# Patient Record
Sex: Female | Born: 1950
Health system: Southern US, Community
[De-identification: ages and names within clinical notes are randomized; demographics above are authoritative.]

## PROBLEM LIST (undated history)

## (undated) DIAGNOSIS — D61818 Other pancytopenia: Secondary | ICD-10-CM

## (undated) DIAGNOSIS — E43 Unspecified severe protein-calorie malnutrition: Secondary | ICD-10-CM

## (undated) DIAGNOSIS — J45909 Unspecified asthma, uncomplicated: Secondary | ICD-10-CM

## (undated) DIAGNOSIS — M069 Rheumatoid arthritis, unspecified: Secondary | ICD-10-CM

## (undated) DIAGNOSIS — R42 Dizziness and giddiness: Secondary | ICD-10-CM

## (undated) DIAGNOSIS — B0089 Other herpesviral infection: Secondary | ICD-10-CM

## (undated) DIAGNOSIS — I10 Essential (primary) hypertension: Secondary | ICD-10-CM

## (undated) DIAGNOSIS — J449 Chronic obstructive pulmonary disease, unspecified: Secondary | ICD-10-CM

## (undated) DIAGNOSIS — E7800 Pure hypercholesterolemia, unspecified: Secondary | ICD-10-CM

## (undated) HISTORY — PX: OTHER SURGICAL HISTORY: SHX169

## (undated) HISTORY — PX: ABDOMINAL HYSTERECTOMY: SHX81

## (undated) HISTORY — DX: Rheumatoid arthritis, unspecified: M06.9

## (undated) HISTORY — PX: HIP ARTHROPLASTY: SHX981

## (undated) HISTORY — DX: Unspecified asthma, uncomplicated: J45.909

## (undated) HISTORY — DX: Dizziness and giddiness: R42

## (undated) HISTORY — DX: Pure hypercholesterolemia, unspecified: E78.00

## (undated) HISTORY — DX: Essential (primary) hypertension: I10

---
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
## 2000-08-09 ENCOUNTER — Inpatient Hospital Stay (HOSPITAL_COMMUNITY): Admission: EM | Admit: 2000-08-09 | Discharge: 2000-08-11 | Payer: Self-pay | Admitting: Emergency Medicine

## 2000-10-26 ENCOUNTER — Ambulatory Visit: Admission: RE | Admit: 2000-10-26 | Discharge: 2000-10-26 | Payer: Self-pay | Admitting: Pulmonary Disease

## 2016-05-13 ENCOUNTER — Other Ambulatory Visit: Payer: Self-pay | Admitting: Rheumatology

## 2016-05-14 LAB — COMPREHENSIVE METABOLIC PANEL
ALT: 23 IU/L (ref 0–32)
AST: 28 IU/L (ref 0–40)
Albumin/Globulin Ratio: 1.2 (ref 1.2–2.2)
Albumin: 3.8 g/dL (ref 3.6–4.8)
Alkaline Phosphatase: 83 IU/L (ref 39–117)
BUN/Creatinine Ratio: 20 (ref 12–28)
BUN: 28 mg/dL — ABNORMAL HIGH (ref 8–27)
Bilirubin Total: 0.3 mg/dL (ref 0.0–1.2)
CO2: 25 mmol/L (ref 18–29)
Calcium: 9 mg/dL (ref 8.7–10.3)
Chloride: 102 mmol/L (ref 96–106)
Creatinine, Ser: 1.41 mg/dL — ABNORMAL HIGH (ref 0.57–1.00)
GFR calc Af Amer: 45 mL/min/{1.73_m2} — ABNORMAL LOW (ref >59)
GFR calc non Af Amer: 39 mL/min/{1.73_m2} — ABNORMAL LOW (ref >59)
Globulin, Total: 3.2 g/dL (ref 1.5–4.5)
Glucose: 112 mg/dL — ABNORMAL HIGH (ref 65–99)
Potassium: 4.5 mmol/L (ref 3.5–5.2)
Sodium: 143 mmol/L (ref 134–144)
Total Protein: 7 g/dL (ref 6.0–8.5)

## 2016-05-14 LAB — CBC WITH DIFFERENTIAL/PLATELET
Basophils Absolute: 0.1 10*3/uL (ref 0.0–0.2)
Basos: 1 %
EOS (ABSOLUTE): 0.2 10*3/uL (ref 0.0–0.4)
Eos: 3 %
Hematocrit: 40 % (ref 34.0–46.6)
Hemoglobin: 13.4 g/dL (ref 11.1–15.9)
Immature Grans (Abs): 0 10*3/uL (ref 0.0–0.1)
Immature Granulocytes: 0 %
Lymphocytes Absolute: 3.7 10*3/uL — ABNORMAL HIGH (ref 0.7–3.1)
Lymphs: 41 %
MCH: 30.4 pg (ref 26.6–33.0)
MCHC: 33.5 g/dL (ref 31.5–35.7)
MCV: 91 fL (ref 79–97)
Monocytes Absolute: 0.7 10*3/uL (ref 0.1–0.9)
Monocytes: 8 %
Neutrophils Absolute: 4.3 10*3/uL (ref 1.4–7.0)
Neutrophils: 47 %
Platelets: 307 10*3/uL (ref 150–379)
RBC: 4.41 x10E6/uL (ref 3.77–5.28)
RDW: 13.5 % (ref 12.3–15.4)
WBC: 9.1 10*3/uL (ref 3.4–10.8)

## 2016-05-20 ENCOUNTER — Telehealth: Payer: Self-pay | Admitting: Radiology

## 2016-05-20 NOTE — Telephone Encounter (Signed)
Refill request received via fax for Humira [place]

## 2016-05-22 NOTE — Telephone Encounter (Signed)
I have called patient left message for her to call me back so we can discuss

## 2016-05-22 NOTE — Progress Notes (Signed)
Please fax results to PCP. Notify patient to see PCP

## 2016-05-22 NOTE — Telephone Encounter (Signed)
-----   Message from Bo Merino, MD sent at 05/22/2016  4:06 PM EST ----- Please fax results to PCP. Notify patient to see PCP

## 2016-05-23 MED ORDER — ADALIMUMAB 40 MG/0.8ML ~~LOC~~ AJKT: 1.0000 "pen " | AUTO-INJECTOR | SUBCUTANEOUS | 0 refills | Status: DC

## 2016-05-23 NOTE — Telephone Encounter (Signed)
I have discussed with patient / she has voiced understanding she will also get copy of labs when she comes into the office Ok to refill Humira per Dr Estanislado Pandy

## 2016-05-26 ENCOUNTER — Encounter: Payer: Self-pay | Admitting: Rheumatology

## 2016-05-26 ENCOUNTER — Ambulatory Visit: Payer: Self-pay | Admitting: Rheumatology

## 2016-05-26 ENCOUNTER — Ambulatory Visit (INDEPENDENT_AMBULATORY_CARE_PROVIDER_SITE_OTHER): Payer: BLUE CROSS/BLUE SHIELD | Admitting: Rheumatology

## 2016-05-26 VITALS — BP 136/86 | HR 84 | Resp 14 | Ht 63.0 in | Wt 182.0 lb

## 2016-05-26 DIAGNOSIS — Z79899 Other long term (current) drug therapy: Secondary | ICD-10-CM

## 2016-05-26 DIAGNOSIS — J069 Acute upper respiratory infection, unspecified: Secondary | ICD-10-CM | POA: Diagnosis not present

## 2016-05-26 DIAGNOSIS — B9789 Other viral agents as the cause of diseases classified elsewhere: Secondary | ICD-10-CM | POA: Diagnosis not present

## 2016-05-26 DIAGNOSIS — M0579 Rheumatoid arthritis with rheumatoid factor of multiple sites without organ or systems involvement: Secondary | ICD-10-CM

## 2016-05-26 DIAGNOSIS — N289 Disorder of kidney and ureter, unspecified: Secondary | ICD-10-CM

## 2016-05-26 NOTE — Progress Notes (Signed)
Office Visit Note  Patient: Kaylee Rasmussen             Date of Birth: [birth year]           MRN: [id number]             PCP: Nicoletta Dress, MD Referring: No ref. provider found Visit Date: 05/26/2016 Occupation: @GUAROCC @    Subjective:  No chief complaint on file. Follow-up on rheumatoid arthritis and high risk prescription Currently has upper respiratory infection since Friday    History of Present Illness: Kaylee Rasmussen is a 65 y.o. female  Last seen 12/20/2015 Patient states that she is doing well with her Humira. She has no joint pain swelling or stiffness Her right elbow has a 25 contracture from the past but the left elbows contractor has resolved according to the patient. She has perfect range of motion according to the patient. In addition, she states that her rheumatoid nodule on the left elbow is even smaller.  Currently she's had a cold since this past Friday. We discussed stopping her Humira temporarily until her cold resolves. Goal is that she starts her Humira again in about a week or 2. Patient is agreeable. She has cough and chest congestion occasional fever runny nose. In the past she's had bronchitis. Please see note from 12/20/2015 where was noted that she was off of her Humira since April 2017 due to bronchitis.  Patient's TB gold is up-to-date and normal as of September 2017  She has enough Humira at home.  Note that she will be 66 and on Medicare starting approximately July 2018.   Activities of Daily Living:  Patient reports morning stiffness for 15 minutes.   Patient Denies nocturnal pain.  Difficulty dressing/grooming: Denies Difficulty climbing stairs: Denies Difficulty getting out of chair: Denies Difficulty using hands for taps, buttons, cutlery, and/or writing: Denies   Review of Systems  Constitutional: Negative for fatigue.  HENT: Negative for mouth sores and mouth dryness.   Eyes: Negative for dryness.  Respiratory: Negative for  shortness of breath.   Gastrointestinal: Negative for constipation and diarrhea.  Musculoskeletal: Negative for myalgias and myalgias.  Skin: Negative for sensitivity to sunlight.  Psychiatric/Behavioral: Negative for decreased concentration and sleep disturbance.    PMFS History:  There are no active problems to display for this patient.   Past Medical History:  Diagnosis Date  . Rheumatoid arthritis ([place])     Family History  Problem Relation Age of Onset  . Diabetes Mother   . Heart attack Mother   . Heart attack Father    Past Surgical History:  Procedure Laterality Date  . ABDOMINAL HYSTERECTOMY    . HIP ARTHROPLASTY    . Lumbarectomy     Social History   Social History Narrative  . No narrative on file     Objective: Vital Signs: BP 136/86 (BP Location: Left Arm, Patient Position: Sitting, Cuff Size: Large)   Pulse 84   Resp 14   Ht 5\' 3"  (1.6 m)   Wt 182 lb (82.6 kg)   BMI 32.24 kg/m    Physical Exam  Constitutional: She is oriented to person, place, and time. She appears well-developed and well-nourished.  HENT:  Head: Normocephalic and atraumatic.  Eyes: EOM are normal. Pupils are equal, round, and reactive to light.  Cardiovascular: Normal rate, regular rhythm and normal heart sounds.  Exam reveals no gallop and no friction rub.   No murmur heard. Pulmonary/Chest: Effort normal and breath  sounds normal. She has no wheezes. She has no rales.  Abdominal: Soft. Bowel sounds are normal. She exhibits no distension. There is no tenderness. There is no guarding. No hernia.  Musculoskeletal: Normal range of motion. She exhibits no edema, tenderness or deformity.  Lymphadenopathy:    She has no cervical adenopathy.  Neurological: She is alert and oriented to person, place, and time. Coordination normal.  Skin: Skin is warm and dry. Capillary refill takes less than 2 seconds. No rash noted.  Psychiatric: She has a normal mood and affect. Her behavior is normal.    Nursing note and vitals reviewed.    Musculoskeletal Exam:  Full range of motion of all joints Grip strength is equal and strong bilaterally Fiber myalgia tender points are all absent  CDAI Exam: No CDAI exam completed.    Investigation: No additional findings. Please see labs from 05/13/2016 in Epic. All labs are normal except for elevated creatinine 1.41; GFR low at 39  Imaging: No results found.  Speciality Comments: No specialty comments available.    Procedures:  No procedures performed Allergies: Scallops [shellfish allergy]   Assessment / Plan:     Visit Diagnoses: Viral upper respiratory tract infection - started friday, May 23, 2016; no fever, hx of bronchitis; + congestion; + cough, +Runny nose;  Rheumatoid arthritis with rheumatoid factor of multiple sites without organ or systems involvement ([place]) - Ongoing contracture right elbow; resolved contracture left elbow; resolving left elbow joint nodules  High risk medications (not anticoagulants) long-term use  Abnormal kidney function - 05/13/2016: CBC with differential is normal: CMP with GFR is normal except for elevated creatinine to 1.41; GFR low at 39.   Patient is doing well with her current treatment of rheumatoid arthritis that is erosive in nature with nodulosis with Humira. She's been on this medication for the last 13-14 years. She is due for another Humira shot this Friday but I've asked the patient to skip her Humira injection this coming Friday since she has upper respiratory infection. She is to restart her Humira as soon as her upper respiratory infection is resolved  Patient does not need Humira refilled at this time. She will call us if she needs it  Labs are up-to-date from 05/13/2016. Patient's kidney functions are slightly abnormal. Please see labs for full details. We will repeat CMP with GFR in one month. We will also send these labs to PCP so they can evaluate and treat. Her only  medication from our offices Humira. She is not on methotrexate.  Orders: No orders of the defined types were placed in this encounter.  No orders of the defined types were placed in this encounter.   Face-to-face time spent with patient was 30 minutes. 50% of time was spent in counseling and coordination of care.  Follow-Up Instructions: Return in about 5 months (around 10/24/2016) for RA,Humira Q 2 wks; Abnl kidney fnx May 13, 2016, contracture.   Eliezer Lofts, PA-C I examined and evaluated the patient with Eliezer Lofts PA. The plan of care was discussed as noted above.  Bo Merino, MD

## 2016-08-07 ENCOUNTER — Other Ambulatory Visit: Payer: Self-pay | Admitting: *Deleted

## 2016-08-07 MED ORDER — ADALIMUMAB 40 MG/0.8ML ~~LOC~~ AJKT: 1.0000 "pen " | AUTO-INJECTOR | SUBCUTANEOUS | 0 refills | Status: DC

## 2016-08-07 NOTE — Telephone Encounter (Signed)
Refill request received via fax for Humira  Last Visit: 05/26/16 Next Visit: 10/24/16 Labs: 05/13/16 Creat. 1.41 GFR 39 TB Gold: 02/22/16 Neg  Left message to remind patient she is due for labs this month.   Okay to refill Humira?

## 2016-08-19 ENCOUNTER — Telehealth: Payer: Self-pay | Admitting: Rheumatology

## 2016-08-19 NOTE — Telephone Encounter (Signed)
Patient advised that based off her last office note she just needs standing orders (CBC and CMP). Patient verbalized understanding.

## 2016-08-19 NOTE — Telephone Encounter (Signed)
Patient called stating that it is time to have her usual blood work done but Mr. Carlyon Shadow stated she needed more extensive blood work done but she has lost the order.  She is wanting to know if the order could be faxed to [facility] in [place], [phone].  Thank you.

## 2016-08-22 ENCOUNTER — Other Ambulatory Visit: Payer: Self-pay | Admitting: *Deleted

## 2016-08-22 DIAGNOSIS — Z79899 Other long term (current) drug therapy: Secondary | ICD-10-CM

## 2016-08-22 LAB — CBC WITH DIFFERENTIAL/PLATELET
BASOS PCT: 1 %
Basophils Absolute: 83 cells/uL (ref 0–200)
EOS ABS: 498 {cells}/uL (ref 15–500)
EOS PCT: 6 %
HCT: 38.5 % (ref 35.0–45.0)
Hemoglobin: 12.9 g/dL (ref 11.7–15.5)
Lymphocytes Relative: 39 %
Lymphs Abs: 3237 cells/uL (ref 850–3900)
MCH: 30.7 pg (ref 27.0–33.0)
MCHC: 33.5 g/dL (ref 32.0–36.0)
MCV: 91.7 fL (ref 80.0–100.0)
MONOS PCT: 10 %
MPV: 9.4 fL (ref 7.5–12.5)
Monocytes Absolute: 830 cells/uL (ref 200–950)
NEUTROS ABS: 3652 {cells}/uL (ref 1500–7800)
Neutrophils Relative %: 44 %
PLATELETS: 305 10*3/uL (ref 140–400)
RBC: 4.2 MIL/uL (ref 3.80–5.10)
RDW: 14 % (ref 11.0–15.0)
WBC: 8.3 10*3/uL (ref 3.8–10.8)

## 2016-08-22 LAB — COMPLETE METABOLIC PANEL WITH GFR
ALT: 24 U/L (ref 6–29)
AST: 35 U/L (ref 10–35)
Albumin: 3.4 g/dL — ABNORMAL LOW (ref 3.6–5.1)
Alkaline Phosphatase: 69 U/L (ref 33–130)
BILIRUBIN TOTAL: 0.5 mg/dL (ref 0.2–1.2)
BUN: 18 mg/dL (ref 7–25)
CHLORIDE: 108 mmol/L (ref 98–110)
CO2: 25 mmol/L (ref 20–31)
CREATININE: 1.25 mg/dL — AB (ref 0.50–0.99)
Calcium: 8.7 mg/dL (ref 8.6–10.4)
GFR, EST AFRICAN AMERICAN: 53 mL/min — AB (ref >=60)
GFR, Est Non African American: 46 mL/min — ABNORMAL LOW (ref >=60)
GLUCOSE: 108 mg/dL — AB (ref 65–99)
Potassium: 3.9 mmol/L (ref 3.5–5.3)
SODIUM: 143 mmol/L (ref 135–146)
TOTAL PROTEIN: 6.7 g/dL (ref 6.1–8.1)

## 2016-08-25 NOTE — Progress Notes (Signed)
Labs stable

## 2016-10-23 ENCOUNTER — Ambulatory Visit (INDEPENDENT_AMBULATORY_CARE_PROVIDER_SITE_OTHER): Payer: BLUE CROSS/BLUE SHIELD | Admitting: Rheumatology

## 2016-10-23 ENCOUNTER — Encounter: Payer: Self-pay | Admitting: Rheumatology

## 2016-10-23 VITALS — BP 177/85 | HR 63 | Resp 15 | Ht 63.0 in | Wt 184.0 lb

## 2016-10-23 DIAGNOSIS — M0579 Rheumatoid arthritis with rheumatoid factor of multiple sites without organ or systems involvement: Secondary | ICD-10-CM | POA: Diagnosis not present

## 2016-10-23 DIAGNOSIS — Z79899 Other long term (current) drug therapy: Secondary | ICD-10-CM | POA: Diagnosis not present

## 2016-10-23 DIAGNOSIS — R944 Abnormal results of kidney function studies: Secondary | ICD-10-CM

## 2016-10-23 DIAGNOSIS — R7989 Other specified abnormal findings of blood chemistry: Secondary | ICD-10-CM

## 2016-10-23 DIAGNOSIS — M170 Bilateral primary osteoarthritis of knee: Secondary | ICD-10-CM

## 2016-10-23 NOTE — Patient Instructions (Signed)
   Standing Labs We placed an order today for your standing lab work.    Please come back and get your standing labs every 3 months starting First week of June 2018, and then again in September 2018( Will also need TB Gold at that time).  We have open lab Monday through Friday from 8:30-11:30 AM and 1:30-4 PM at the office of Dr. Tresa Moore, PA.   The office is located at [address] [place], [place], [place] [address] No appointment is necessary.   Labs are drawn by [facility].  You may receive a bill from [facility] for your lab work.

## 2016-10-23 NOTE — Progress Notes (Signed)
Office Visit Note  Patient: Kaylee Rasmussen             Date of Birth: 16-Jul-1950           MRN: [id number]             PCP: Nicoletta Dress, MD Referring: Nicoletta Dress, MD Visit Date: 10/23/2016 Occupation: _0 @    Subjective:  Follow-up   History of Present Illness: Kaylee Rasmussen is a 66 y.o. female  LAST SEEN 05/26/2016.   History of rheumatoid arthritis. Positive rheumatoid factor. Had to suspend Humira temporarily while patient was fighting bronchitis in dec 2017.  Please see last visit December 2017 for full details. After her bronchitis resolved, She restarted the Humira and has been doing well. Patient is taking her medication every 14 TO 17 DAYS. Despite extending it out for a few days to 17 days each week injections, she has No joint pain, swelling, or stiffness.  Activities of Daily Living:  Patient reports morning stiffness for 30 minutes.   Patient Denies nocturnal pain.  Difficulty dressing/grooming: Denies Difficulty climbing stairs: Denies Difficulty getting out of chair: Denies Difficulty using hands for taps, buttons, cutlery, and/or writing: Denies   Review of Systems  Constitutional: Negative for fatigue.  HENT: Negative for mouth sores and mouth dryness.   Eyes: Negative for dryness.  Respiratory: Negative for shortness of breath.   Gastrointestinal: Negative for constipation and diarrhea.  Musculoskeletal: Negative for myalgias and myalgias.  Skin: Negative for sensitivity to sunlight.  Psychiatric/Behavioral: Negative for decreased concentration and sleep disturbance.    PMFS History:  There are no active problems to display for this patient.   Past Medical History:  Diagnosis Date  . High cholesterol   . Hypertension   . Rheumatoid arthritis ([place])     Family History  Problem Relation Age of Onset  . Diabetes Mother   . Heart attack Mother   . Heart attack Father    Past Surgical History:  Procedure Laterality Date  .  ABDOMINAL HYSTERECTOMY    . HIP ARTHROPLASTY    . Lumbarectomy     Social History   Social History Narrative  . No narrative on file     Objective: Vital Signs: BP (!) 177/85 (BP Location: Left Arm, Patient Position: Sitting, Cuff Size: Normal)   Pulse 63   Resp 15   Ht 5' 3" (1.6 m)   Wt 184 lb (83.5 kg)   BMI 32.59 kg/m    Physical Exam  Constitutional: She is oriented to person, place, and time. She appears well-developed and well-nourished.  HENT:  Head: Normocephalic and atraumatic.  Eyes: EOM are normal. Pupils are equal, round, and reactive to light.  Cardiovascular: Normal rate, regular rhythm and normal heart sounds.  Exam reveals no gallop and no friction rub.   No murmur heard. Pulmonary/Chest: Effort normal and breath sounds normal. She has no wheezes. She has no rales.  Abdominal: Soft. Bowel sounds are normal. She exhibits no distension. There is no tenderness. There is no guarding. No hernia.  Musculoskeletal: Normal range of motion. She exhibits no edema, tenderness or deformity.  Lymphadenopathy:    She has no cervical adenopathy.  Neurological: She is alert and oriented to person, place, and time. Coordination normal.  Skin: Skin is warm and dry. Capillary refill takes less than 2 seconds. No rash noted.  Psychiatric: She has a normal mood and affect. Her behavior is normal.  Nursing note and vitals reviewed.  Musculoskeletal Exam:  Full range of motion of all joints Grip strength is equal and strong bilaterally Fibromyalgia tender points are all absent  CDAI Exam: CDAI Homunculus Exam:   Joint Counts:  CDAI Tender Joint count: 0 CDAI Swollen Joint count: 0   No synovitis on examination  Investigation: No additional findings.   TB GOLD neg 02/22/16  Orders Only on 08/22/2016  Component Date Value Ref Range Status  . WBC 08/22/2016 8.3  3.8 - 10.8 K/uL Final  . RBC 08/22/2016 4.20  3.80 - 5.10 MIL/uL Final  . Hemoglobin 08/22/2016 12.9   11.7 - 15.5 g/dL Final  . HCT 08/22/2016 38.5  35.0 - 45.0 % Final  . MCV 08/22/2016 91.7  80.0 - 100.0 fL Final  . MCH 08/22/2016 30.7  27.0 - 33.0 pg Final  . MCHC 08/22/2016 33.5  32.0 - 36.0 g/dL Final  . RDW 08/22/2016 14.0  11.0 - 15.0 % Final  . Platelets 08/22/2016 305  140 - 400 K/uL Final  . MPV 08/22/2016 9.4  7.5 - 12.5 fL Final  . Neutro Abs 08/22/2016 3652  1,500 - 7,800 cells/uL Final  . Lymphs Abs 08/22/2016 3237  850 - 3,900 cells/uL Final  . Monocytes Absolute 08/22/2016 830  200 - 950 cells/uL Final  . Eosinophils Absolute 08/22/2016 498  15 - 500 cells/uL Final  . Basophils Absolute 08/22/2016 83  0 - 200 cells/uL Final  . Neutrophils Relative % 08/22/2016 44  % Final  . Lymphocytes Relative 08/22/2016 39  % Final  . Monocytes Relative 08/22/2016 10  % Final  . Eosinophils Relative 08/22/2016 6  % Final  . Basophils Relative 08/22/2016 1  % Final  . Smear Review 08/22/2016 Criteria for review not met   Final  . Sodium 08/22/2016 143  135 - 146 mmol/L Final  . Potassium 08/22/2016 3.9  3.5 - 5.3 mmol/L Final  . Chloride 08/22/2016 108  98 - 110 mmol/L Final  . CO2 08/22/2016 25  20 - 31 mmol/L Final  . Glucose, Bld 08/22/2016 108* 65 - 99 mg/dL Final  . BUN 08/22/2016 18  7 - 25 mg/dL Final  . Creat 08/22/2016 1.25* 0.50 - 0.99 mg/dL Final   Comment:   For patients > or = 65 years of age: The upper reference limit for Creatinine is approximately 13% higher for people identified as African-American.     . Total Bilirubin 08/22/2016 0.5  0.2 - 1.2 mg/dL Final  . Alkaline Phosphatase 08/22/2016 69  33 - 130 U/L Final  . AST 08/22/2016 35  10 - 35 U/L Final  . ALT 08/22/2016 24  6 - 29 U/L Final  . Total Protein 08/22/2016 6.7  6.1 - 8.1 g/dL Final  . Albumin 08/22/2016 3.4* 3.6 - 5.1 g/dL Final  . Calcium 08/22/2016 8.7  8.6 - 10.4 mg/dL Final  . GFR, Est African American 08/22/2016 53* >=60 mL/min Final  . GFR, Est Non African American 08/22/2016 46* >=60  mL/min Final  Orders Only on 05/13/2016  Component Date Value Ref Range Status  . WBC 05/13/2016 9.1  3.4 - 10.8 x10E3/uL Final  . RBC 05/13/2016 4.41  3.77 - 5.28 x10E6/uL Final  . Hemoglobin 05/13/2016 13.4  11.1 - 15.9 g/dL Final   Comment: **Effective May 26, 2016 the reference interval**   for Hemoglobin MALES only will be changing to:  Males 13-15 years: 12.6 - 17.7                         Males   >15 years: 13.0 - 17.7   . Hematocrit 05/13/2016 40.0  34.0 - 46.6 % Final  . MCV 05/13/2016 91  79 - 97 fL Final  . MCH 05/13/2016 30.4  26.6 - 33.0 pg Final  . MCHC 05/13/2016 33.5  31.5 - 35.7 g/dL Final  . RDW 05/13/2016 13.5  12.3 - 15.4 % Final  . Platelets 05/13/2016 307  150 - 379 x10E3/uL Final  . Neutrophils 05/13/2016 47  Not Estab. % Final  . Lymphs 05/13/2016 41  Not Estab. % Final  . Monocytes 05/13/2016 8  Not Estab. % Final  . Eos 05/13/2016 3  Not Estab. % Final  . Basos 05/13/2016 1  Not Estab. % Final  . Neutrophils Absolute 05/13/2016 4.3  1.4 - 7.0 x10E3/uL Final  . Lymphocytes Absolute 05/13/2016 3.7* 0.7 - 3.1 x10E3/uL Final  . Monocytes Absolute 05/13/2016 0.7  0.1 - 0.9 x10E3/uL Final  . EOS (ABSOLUTE) 05/13/2016 0.2  0.0 - 0.4 x10E3/uL Final  . Basophils Absolute 05/13/2016 0.1  0.0 - 0.2 x10E3/uL Final  . Immature Granulocytes 05/13/2016 0  Not Estab. % Final  . Immature Grans (Abs) 05/13/2016 0.0  0.0 - 0.1 x10E3/uL Final  . Glucose 05/13/2016 112* 65 - 99 mg/dL Final  . BUN 05/13/2016 28* 8 - 27 mg/dL Final  . Creatinine, Ser 05/13/2016 1.41* 0.57 - 1.00 mg/dL Final  . GFR calc non Af Amer 05/13/2016 39* >59 mL/min/1.73 Final  . GFR calc Af Amer 05/13/2016 45* >59 mL/min/1.73 Final  . BUN/Creatinine Ratio 05/13/2016 20  12 - 28 Final  . Sodium 05/13/2016 143  134 - 144 mmol/L Final  . Potassium 05/13/2016 4.5  3.5 - 5.2 mmol/L Final  . Chloride 05/13/2016 102  96 - 106 mmol/L Final  . CO2 05/13/2016 25  18 - 29 mmol/L Final    . Calcium 05/13/2016 9.0  8.7 - 10.3 mg/dL Final  . Total Protein 05/13/2016 7.0  6.0 - 8.5 g/dL Final  . Albumin 05/13/2016 3.8  3.6 - 4.8 g/dL Final  . Globulin, Total 05/13/2016 3.2  1.5 - 4.5 g/dL Final  . Albumin/Globulin Ratio 05/13/2016 1.2  1.2 - 2.2 Final  . Bilirubin Total 05/13/2016 0.3  0.0 - 1.2 mg/dL Final  . Alkaline Phosphatase 05/13/2016 83  39 - 117 IU/L Final  . AST 05/13/2016 28  0 - 40 IU/L Final  . ALT 05/13/2016 23  0 - 32 IU/L Final     Imaging: No results found.  Speciality Comments: No specialty comments available.    Procedures:  No procedures performed Allergies: Scallops [shellfish allergy]   Assessment / Plan:     Visit Diagnoses: High risk medications (not anticoagulants) long-term use - Plan: CBC with Differential/Platelet, COMPLETE METABOLIC PANEL WITH GFR, Quantiferon tb gold assay (blood)  Rheumatoid arthritis with rheumatoid factor of multiple sites without organ or systems involvement ([facility])  Primary osteoarthritis of both knees  Elevated serum creatinine  Decreased GFR   TB GOLD neg 02/22/16  Continue Humira every 14-17 days  We will monitor elevated creatinine and decreased GFR  CBC with differential, CMP with GFR every 3 months  Orders: Orders Placed This Encounter  Procedures  . CBC with Differential/Platelet  . COMPLETE METABOLIC PANEL WITH GFR  . Quantiferon tb gold assay (blood)  No orders of the defined types were placed in this encounter.   Face-to-face time spent with patient was 30 minutes. 50% of time was spent in counseling and coordination of care.  Follow-Up Instructions: Return in about 5 months (around 03/25/2017).   Eliezer Lofts, PA-C  Note - This record has been created using [employer].  Chart creation errors have been sought, but may not always  have been located. Such creation errors do not reflect on  the standard of medical care.

## 2016-10-23 NOTE — Progress Notes (Signed)
I was asked to speak to patient during her visit regarding Humira coverage with Medicare.  Patient reports she is going to be switching to [facility] in July.  Patient reports she is currently working with an Medical illustrator to identify which insurance will be best for her, but she is still concerned about the cost of Humira with medicare.  Had a detailed discussion regarding Medicare and copay assistance.  Advised that manufacturer copay cards cannot be used with Medicare.  Discussed grant foundations, but advised that they have limited funding.  Also discussed manufacturer patient assistance program.  Reviewed income qualifications for the [place] patient assistance program for Humira, and patient thinks she would qualify.  I printed off application for patient.  Patient will need the information on her new insurance plan before we can apply.  She plans to complete the application and get required income information.  She will bring application to our office when it is completed.  I will follow up with patient in early July if I have not heard from her at that point.    Elisabeth Most, Pharm.D., BCPS, CPP Clinical Pharmacist Pager: [phone] Phone: [phone] 10/23/2016 2:57 PM

## 2016-10-24 ENCOUNTER — Ambulatory Visit: Payer: BLUE CROSS/BLUE SHIELD | Admitting: Rheumatology

## 2016-11-24 ENCOUNTER — Telehealth: Payer: Self-pay | Admitting: Pharmacist

## 2016-11-24 ENCOUNTER — Other Ambulatory Visit: Payer: Self-pay

## 2016-11-24 DIAGNOSIS — Z79899 Other long term (current) drug therapy: Secondary | ICD-10-CM

## 2016-11-24 LAB — CBC WITH DIFFERENTIAL/PLATELET
BASOS PCT: 0 %
Basophils Absolute: 0 cells/uL (ref 0–200)
EOS PCT: 4 %
Eosinophils Absolute: 296 cells/uL (ref 15–500)
HEMATOCRIT: 43.4 % (ref 35.0–45.0)
HEMOGLOBIN: 14.2 g/dL (ref 11.7–15.5)
LYMPHS ABS: 3404 {cells}/uL (ref 850–3900)
Lymphocytes Relative: 46 %
MCH: 29.6 pg (ref 27.0–33.0)
MCHC: 32.7 g/dL (ref 32.0–36.0)
MCV: 90.4 fL (ref 80.0–100.0)
MONO ABS: 1036 {cells}/uL — AB (ref 200–950)
MPV: 9.5 fL (ref 7.5–12.5)
Monocytes Relative: 14 %
NEUTROS ABS: 2664 {cells}/uL (ref 1500–7800)
NEUTROS PCT: 36 %
Platelets: 268 10*3/uL (ref 140–400)
RBC: 4.8 MIL/uL (ref 3.80–5.10)
RDW: 14.3 % (ref 11.0–15.0)
WBC: 7.4 10*3/uL (ref 3.8–10.8)

## 2016-11-24 NOTE — Telephone Encounter (Signed)
Yes, it is Okay to submit for Humira patient assistance program. I signed it already

## 2016-11-24 NOTE — Telephone Encounter (Signed)
Form was faxed to [facility] patient assistance program.  Will update patient once we know status of application.   Elisabeth Most, Pharm.D., BCPS, CPP Clinical Pharmacist Pager: [phone] Phone: [phone] 11/24/2016 4:24 PM

## 2016-11-24 NOTE — Telephone Encounter (Signed)
Patient came into the office for labs today.  She brought her portion of Humira patient assistance application, new insurance cards, and income information.    Last visit: 10/23/16 Next visit: 03/26/17 Labs: 08/22/16 labs stable.  Patient had repeat labs today 02/22/16 TB Gold negative  Okay to submit for Humira patient assistance program?  Form was placed on your desk   Elisabeth Most, Pharm.D., BCPS, CPP Clinical Pharmacist Pager: [phone] Phone: [phone] 11/24/2016 2:14 PM

## 2016-11-25 LAB — COMPLETE METABOLIC PANEL WITH GFR
ALBUMIN: 3.5 g/dL — AB (ref 3.6–5.1)
ALK PHOS: 75 U/L (ref 33–130)
ALT: 29 U/L (ref 6–29)
AST: 28 U/L (ref 10–35)
BUN: 20 mg/dL (ref 7–25)
CALCIUM: 8.7 mg/dL (ref 8.6–10.4)
CO2: 21 mmol/L (ref 20–31)
CREATININE: 1.12 mg/dL — AB (ref 0.50–0.99)
Chloride: 108 mmol/L (ref 98–110)
GFR, Est African American: 60 mL/min (ref >=60)
GFR, Est Non African American: 52 mL/min — ABNORMAL LOW (ref >=60)
GLUCOSE: 81 mg/dL (ref 65–99)
POTASSIUM: 4.5 mmol/L (ref 3.5–5.3)
SODIUM: 142 mmol/L (ref 135–146)
TOTAL PROTEIN: 6.7 g/dL (ref 6.1–8.1)
Total Bilirubin: 0.6 mg/dL (ref 0.2–1.2)

## 2016-12-08 ENCOUNTER — Telehealth: Payer: Self-pay

## 2016-12-08 NOTE — Telephone Encounter (Signed)
I informed patient of status of her application.  I advised her to contact us if she runs out of Humira and we can provider her with a sample.  Patient voiced understanding and denies any questions or concerns at this time.   Elisabeth Most, Pharm.D., BCPS, CPP Clinical Pharmacist Pager: [phone] Phone: [phone] 12/08/2016 4:15 PM

## 2016-12-08 NOTE — Telephone Encounter (Signed)
Spoke with [name], a representative, who states that the patient's insurance benefits will not start until December 21, 2016 therefore, her application will not be processed until then. Once the application has been processed, we should have an answer within 5 to 7 business day. Will check back and update once we receive a response.   Raiden Haydu, [place], CPhT 11:15 AM

## 2016-12-22 ENCOUNTER — Telehealth: Payer: Self-pay

## 2016-12-22 NOTE — Telephone Encounter (Signed)
Patient's new insurance started on 12/21/16. Called [employer] Patient Assistance to verify that the patient's application was being processed with her new information. Spoke to Chase who states that the application has been received. The application will be processed within the next two days and we probably will not have an answer until later next week.   Will check back and update once we receive a response.  Floride Hutmacher, [place], CPhT 10:21 AM

## 2016-12-30 NOTE — Telephone Encounter (Signed)
Called [employer] Patient Assistance to check status of patient's application. Spoke with Melissa who states that all documents were received and the application would be reviewed between today 12/31/26 and 12/31/16. Once a decision has been made a faxed letter will be sent to the clinic and a mailed letter to the patient.   Will update once we receive a response.  Case ID: [id number] Phone: [phone]  Demetrios Loll, CPhT 11:15 AM

## 2017-01-02 NOTE — Telephone Encounter (Signed)
Called [employer] patient assistance to check the status of patient's application. Spoke with Melissa who states that the application was still being processed.   Keil Pickering, [place], CPhT 8:50 AM

## 2017-01-05 ENCOUNTER — Telehealth: Payer: Self-pay | Admitting: Pharmacist

## 2017-01-05 NOTE — Telephone Encounter (Signed)
Patient called to check on the status of her Humira patient assistance program application.  I informed her that we checked on the status of the application on Friday, 5/92/92, and her application is still processing.  Patient confirms she is not out of her medications at this time as she received a refill at the end of June.  I advised patient we will let her know as soon as we hear about her application.   Elisabeth Most, Pharm.D., BCPS, CPP Clinical Pharmacist Pager: [phone] Phone: [phone] 01/05/2017 3:56 PM

## 2017-01-06 NOTE — Telephone Encounter (Signed)
Called [employer] Patient Assistance to check the status of patient's application. Spoke with Coree who states that the application has no been reviewed yet. All information has been received and it should have been reviewed by now. She is sending the information to her supervisor and we should have an answer within 24 to 48 hours.   Will call back and update the patient once we have an answer.   Case ID: [id number]  Demetrios Loll, CPhT 8:28 AM

## 2017-01-08 ENCOUNTER — Telehealth: Payer: Self-pay

## 2017-01-08 NOTE — Telephone Encounter (Signed)
Received a fax from [place] regarding patients' application. Patient has been denied because she does not meet the eligibility criteria for the program.   Called to clarify. Spoke to [employer] who states that the patient should complete a medical expense summary to have the application re-reviewed. The patient can call in or fax in the information.  Spoke to patient to give her the update. We will mail her the form for her to fill out and fax back to the foundation.   Will update once we receive a response.  Esley Brooking, [place], CPhT 4:01 PM

## 2017-01-10 ENCOUNTER — Other Ambulatory Visit: Payer: Self-pay | Admitting: Rheumatology

## 2017-01-12 NOTE — Telephone Encounter (Signed)
I called patient to discuss Humira.  I reminded her that if she is out of Humira and we are still waiting on assistance we can provider her with a sample of medication.  Patient confirms she is not out at this time but will keep that in mind.  Patient denies any further questions or concerns at this time.   Elisabeth Most, Pharm.D., BCPS, CPP Clinical Pharmacist Pager: [phone] Phone: [phone] 01/12/2017 4:25 PM

## 2017-01-12 NOTE — Telephone Encounter (Signed)
ok 

## 2017-01-12 NOTE — Telephone Encounter (Signed)
Last Visit: 10/23/16 Next Visit: 03/26/17 Labs: 11/24/16 Creat 1.12 GFR 52 Previous Creat. 1.25 GFR 46 TB Gold: 02/22/16 Neg  Okay to refill Humira?

## 2017-01-19 ENCOUNTER — Telehealth: Payer: Self-pay

## 2017-01-19 ENCOUNTER — Telehealth: Payer: Self-pay | Admitting: Rheumatology

## 2017-01-19 NOTE — Telephone Encounter (Signed)
Attempted to contact the patient to verify what pharmacy she uses for Humira as we just sent her Humira to [facility] on 01/12/17.

## 2017-01-19 NOTE — Telephone Encounter (Signed)
Patient came by office to provide her financial documents. Information will be faxed to the assistance program an let her know once we receive a response. Patient voices understanding and denies any questions at this time.   Called [employer] Patient Assistance to find out where we needed to fax the information. Spoke with Anastasis who states that we should fax the documents to [phone]. We should have a result within 5 to 7 business days.   Will update once we receive a response.   Case ID: [id number] Phone: [phone]  Demetrios Loll, CPhT 3:50 PM

## 2017-01-19 NOTE — Telephone Encounter (Signed)
Pharmacy requesting RX for Humara. Fax# [phone]

## 2017-01-20 ENCOUNTER — Telehealth: Payer: Self-pay

## 2017-01-20 MED ORDER — ADALIMUMAB 40 MG/0.8ML ~~LOC~~ AJKT: 40.0000 mg | AUTO-INJECTOR | SUBCUTANEOUS | 0 refills | Status: DC

## 2017-01-20 NOTE — Addendum Note (Signed)
Addended by: Carole Binning on: 01/20/2017 04:56 PM   Modules accepted: Orders

## 2017-01-20 NOTE — Telephone Encounter (Signed)
Patient states she had requested the switch to [facility]. Resubmitted prescription to [facility].

## 2017-01-20 NOTE — Telephone Encounter (Signed)
Attempted to contact the patient and left message for patient to call the office.  

## 2017-01-20 NOTE — Telephone Encounter (Signed)
See previous phone note.  

## 2017-01-20 NOTE — Telephone Encounter (Signed)
Patient was returning Andrea's phone call.

## 2017-01-22 ENCOUNTER — Telehealth: Payer: Self-pay | Admitting: Rheumatology

## 2017-01-22 MED ORDER — ADALIMUMAB 40 MG/0.8ML ~~LOC~~ AJKT: 40.0000 mg | AUTO-INJECTOR | SUBCUTANEOUS | 0 refills | Status: DC

## 2017-01-22 NOTE — Telephone Encounter (Signed)
Kaylee Rasmussen has sent the Rx to [facility] on 01/20/17, but she selected the incorrect [place] pharmacy. I have resent it to the correct pharmacy.

## 2017-01-22 NOTE — Telephone Encounter (Signed)
[facility] requesting RX for Humira. Patient has a new insurance that she wants to file with them. Physician line [phone]. Fax # [phone]

## 2017-01-26 ENCOUNTER — Telehealth: Payer: Self-pay | Admitting: Rheumatology

## 2017-01-26 NOTE — Telephone Encounter (Signed)
Pharmacy needed ICD 10 code. Provided them with M05.79.

## 2017-01-26 NOTE — Telephone Encounter (Signed)
Pharmacy left a message about an RX they received for patient. Please call to advise.

## 2017-01-27 ENCOUNTER — Telehealth: Payer: Self-pay

## 2017-01-27 NOTE — Telephone Encounter (Signed)
Received a fax from [place] stating that they had a paid claim and a  prior authorization was not needed at this time. Called to verify. Spoke with Doreed who states that the pharmacy received a paid claim and the pharmacy should reach out to the patient soon. She has an extremely high copay.    After reviewing the patients profile, I found that we are still waiting for a response from patient assistance. Called [employer] to check the status of patients application. Spoke to a representative who states that they have received the medical expense summary and it is currently being reviewed by a counselor. We should have an answer by the end of the week.   Called patient to give her an update on the application. We will contact her when we receive a response. She is not out of medication.   Hamsa Laurich, [place], CPhT 2:43 PM

## 2017-01-27 NOTE — Telephone Encounter (Signed)
Received a fax from [facility] requesting a prior authorization for Humira. Viewed patients profile and found that patient has new [employer] plan to check the status and see if her medication would require a PA. Spoke with Millie who states that the patient's plan started on 12/21/2016. Humira will require a prior authorization.  A prior authorization was submitted to patients' new plan (HTA) via cover my meds. Will update once we receive a response.   Rakeem Colley, [place], CPhT 12:08 PM

## 2017-01-28 NOTE — Telephone Encounter (Signed)
Received a fax from [employer]	[place] regarding a prior authorization approval for Humira from 01/27/17 to 06/22/2017.   Phone number:[phone]  Will send document to scan center.  Called patient to verify where she wanted to get her Rx filled. Patient states that she is still interested in attempting patient assistance. She originally asked for her Rx to go to [facility] so that she would know the amount of the medication in case she was denied assistance. She has 3 pens left of her medication. She has no intentions of getting her Humira filled at [facility]. We should have a response from the assistance program by next week. We will keep her Rx at [facility] until we know the answer. Patient voices understanding and denies any questions at this time.   Georgiann Cocker, [place], CPhT 8:50 AM

## 2017-02-02 ENCOUNTER — Telehealth: Payer: Self-pay

## 2017-02-02 NOTE — Telephone Encounter (Signed)
Called [employer] Patient [employer] to check the status of patient's application. Spoke with [name] who states that the patient has been approved through 06/22/2017. Once the Rx is processed, they will reach out to the patient to set up shipment. An approval letter will be faxed to the clinic.   Called patient to update her. Patient voiced understanding and denied any questions at this time.   Kaylee Rasmussen 10:20 AM

## 2017-02-11 DIAGNOSIS — M8589 Other specified disorders of bone density and structure, multiple sites: Secondary | ICD-10-CM | POA: Diagnosis not present

## 2017-02-11 DIAGNOSIS — Z9181 History of falling: Secondary | ICD-10-CM | POA: Diagnosis not present

## 2017-02-11 DIAGNOSIS — E039 Hypothyroidism, unspecified: Secondary | ICD-10-CM | POA: Diagnosis not present

## 2017-02-11 DIAGNOSIS — Z1231 Encounter for screening mammogram for malignant neoplasm of breast: Secondary | ICD-10-CM | POA: Diagnosis not present

## 2017-02-11 DIAGNOSIS — Z6832 Body mass index (BMI) 32.0-32.9, adult: Secondary | ICD-10-CM | POA: Diagnosis not present

## 2017-02-11 DIAGNOSIS — I10 Essential (primary) hypertension: Secondary | ICD-10-CM | POA: Diagnosis not present

## 2017-02-11 DIAGNOSIS — Z1211 Encounter for screening for malignant neoplasm of colon: Secondary | ICD-10-CM | POA: Diagnosis not present

## 2017-02-11 DIAGNOSIS — E785 Hyperlipidemia, unspecified: Secondary | ICD-10-CM | POA: Diagnosis not present

## 2017-02-25 ENCOUNTER — Other Ambulatory Visit: Payer: Self-pay

## 2017-02-25 DIAGNOSIS — Z79899 Other long term (current) drug therapy: Secondary | ICD-10-CM | POA: Diagnosis not present

## 2017-02-26 LAB — COMPLETE METABOLIC PANEL WITH GFR
AG RATIO: 1.1 (calc) (ref 1.0–2.5)
ALBUMIN MSPROF: 3.6 g/dL (ref 3.6–5.1)
ALT: 19 U/L (ref 6–29)
AST: 32 U/L (ref 10–35)
Alkaline phosphatase (APISO): 61 U/L (ref 33–130)
BUN / CREAT RATIO: 23 (calc) — AB (ref 6–22)
BUN: 28 mg/dL — ABNORMAL HIGH (ref 7–25)
CALCIUM: 9 mg/dL (ref 8.6–10.4)
CO2: 24 mmol/L (ref 20–32)
Chloride: 99 mmol/L (ref 98–110)
Creat: 1.21 mg/dL — ABNORMAL HIGH (ref 0.50–0.99)
GFR, EST AFRICAN AMERICAN: 54 mL/min/{1.73_m2} — AB (ref >=60)
GFR, EST NON AFRICAN AMERICAN: 47 mL/min/{1.73_m2} — AB (ref >=60)
GLOBULIN: 3.3 g/dL (ref 1.9–3.7)
Glucose, Bld: 103 mg/dL — ABNORMAL HIGH (ref 65–99)
POTASSIUM: 3.8 mmol/L (ref 3.5–5.3)
SODIUM: 137 mmol/L (ref 135–146)
TOTAL PROTEIN: 6.9 g/dL (ref 6.1–8.1)
Total Bilirubin: 0.6 mg/dL (ref 0.2–1.2)

## 2017-02-26 LAB — CBC WITH DIFFERENTIAL/PLATELET
Basophils Absolute: 37 cells/uL (ref 0–200)
Basophils Relative: 0.5 %
EOS ABS: 445 {cells}/uL (ref 15–500)
Eosinophils Relative: 6.1 %
HEMATOCRIT: 41.8 % (ref 35.0–45.0)
Hemoglobin: 14.3 g/dL (ref 11.7–15.5)
LYMPHS ABS: 3234 {cells}/uL (ref 850–3900)
MCH: 30 pg (ref 27.0–33.0)
MCHC: 34.2 g/dL (ref 32.0–36.0)
MCV: 87.6 fL (ref 80.0–100.0)
MPV: 10.8 fL (ref 7.5–12.5)
Monocytes Relative: 9.4 %
NEUTROS PCT: 39.7 %
Neutro Abs: 2898 cells/uL (ref 1500–7800)
Platelets: 272 10*3/uL (ref 140–400)
RBC: 4.77 10*6/uL (ref 3.80–5.10)
RDW: 12.7 % (ref 11.0–15.0)
Total Lymphocyte: 44.3 %
WBC: 7.3 10*3/uL (ref 3.8–10.8)
WBCMIX: 686 {cells}/uL (ref 200–950)

## 2017-02-26 NOTE — Progress Notes (Signed)
stable °

## 2017-03-11 DIAGNOSIS — Z1389 Encounter for screening for other disorder: Secondary | ICD-10-CM | POA: Diagnosis not present

## 2017-03-11 DIAGNOSIS — F329 Major depressive disorder, single episode, unspecified: Secondary | ICD-10-CM | POA: Diagnosis not present

## 2017-03-11 DIAGNOSIS — E039 Hypothyroidism, unspecified: Secondary | ICD-10-CM | POA: Diagnosis not present

## 2017-03-11 DIAGNOSIS — J45909 Unspecified asthma, uncomplicated: Secondary | ICD-10-CM | POA: Diagnosis not present

## 2017-03-11 DIAGNOSIS — Z9181 History of falling: Secondary | ICD-10-CM | POA: Diagnosis not present

## 2017-03-11 DIAGNOSIS — Z6831 Body mass index (BMI) 31.0-31.9, adult: Secondary | ICD-10-CM | POA: Diagnosis not present

## 2017-03-11 DIAGNOSIS — I10 Essential (primary) hypertension: Secondary | ICD-10-CM | POA: Diagnosis not present

## 2017-03-12 NOTE — Progress Notes (Deleted)
Office Visit Note  Patient: Kaylee Rasmussen             Date of Birth: [birth year]           MRN: [id number]             PCP: Nicoletta Dress, MD Referring: Nicoletta Dress, MD Visit Date: 03/26/2017 Occupation: @GUAROCC @    Subjective:  No chief complaint on file.   History of Present Illness: Kaylee Rasmussen is a 66 y.o. female ***   Activities of Daily Living:  Patient reports morning stiffness for *** {minute/hour:19697}.   Patient {ACTIONS;DENIES/REPORTS:21021675::"Denies"} nocturnal pain.  Difficulty dressing/grooming: {ACTIONS;DENIES/REPORTS:21021675::"Denies"} Difficulty climbing stairs: {ACTIONS;DENIES/REPORTS:21021675::"Denies"} Difficulty getting out of chair: {ACTIONS;DENIES/REPORTS:21021675::"Denies"} Difficulty using hands for taps, buttons, cutlery, and/or writing: {ACTIONS;DENIES/REPORTS:21021675::"Denies"}   No Rheumatology ROS completed.   PMFS History:  There are no active problems to display for this patient.   Past Medical History:  Diagnosis Date  . High cholesterol   . Hypertension   . Rheumatoid arthritis ([place])     Family History  Problem Relation Age of Onset  . Diabetes Mother   . Heart attack Mother   . Heart attack Father    Past Surgical History:  Procedure Laterality Date  . ABDOMINAL HYSTERECTOMY    . HIP ARTHROPLASTY    . Lumbarectomy     Social History   Social History Narrative  . No narrative on file     Objective: Vital Signs: There were no vitals taken for this visit.   Physical Exam   Musculoskeletal Exam: ***  CDAI Exam: No CDAI exam completed.    Investigation: Findings:  02/22/16 negative TB gold   CBC Latest Ref Rng & Units 02/25/2017 11/24/2016 08/22/2016  WBC 3.8 - 10.8 Thousand/uL 7.3 7.4 8.3  Hemoglobin 11.7 - 15.5 g/dL 14.3 14.2 12.9  Hematocrit 35.0 - 45.0 % 41.8 43.4 38.5  Platelets 140 - 400 Thousand/uL 272 268 305   CMP Latest Ref Rng & Units 02/25/2017 11/24/2016 08/22/2016  Glucose 65 - 99 mg/dL  103(H) 81 108(H)  BUN 7 - 25 mg/dL 28(H) 20 18  Creatinine 0.50 - 0.99 mg/dL 1.21(H) 1.12(H) 1.25(H)  Sodium 135 - 146 mmol/L 137 142 143  Potassium 3.5 - 5.3 mmol/L 3.8 4.5 3.9  Chloride 98 - 110 mmol/L 99 108 108  CO2 20 - 32 mmol/L 24 21 25   Calcium 8.6 - 10.4 mg/dL 9.0 8.7 8.7  Total Protein 6.1 - 8.1 g/dL 6.9 6.7 6.7  Total Bilirubin 0.2 - 1.2 mg/dL 0.6 0.6 0.5  Alkaline Phos 33 - 130 U/L - 75 69  AST 10 - 35 U/L 32 28 35  ALT 6 - 29 U/L 19 29 24     Imaging: No results found.  Speciality Comments: No specialty comments available.    Procedures:  No procedures performed Allergies: Scallops [shellfish allergy]   Assessment / Plan:     Visit Diagnoses: Rheumatoid arthritis involving multiple sites with positive rheumatoid factor/ erosive disease (HCC)  High risk medication use - Humira   Primary osteoarthritis of both knees  History of total right hip replacement  History of stent insertion of renal artery  History of hypertension  History of hypothyroidism  History of hyperlipidemia    Orders: No orders of the defined types were placed in this encounter.  No orders of the defined types were placed in this encounter.   Face-to-face time spent with patient was *** minutes. 50% of time was spent in counseling  and coordination of care.  Follow-Up Instructions: No Follow-up on file.   Amy Littrell, RT  Note - This record has been created using [employer].  Chart creation errors have been sought, but may not always  have been located. Such creation errors do not reflect on  the standard of medical care.

## 2017-03-13 DIAGNOSIS — J45909 Unspecified asthma, uncomplicated: Secondary | ICD-10-CM | POA: Diagnosis not present

## 2017-03-13 DIAGNOSIS — R05 Cough: Secondary | ICD-10-CM | POA: Diagnosis not present

## 2017-03-19 ENCOUNTER — Telehealth: Payer: Self-pay

## 2017-03-19 NOTE — Telephone Encounter (Signed)
Called [employer] Patient [employer] to check the dates for patient renewal applications for 6301. Spoke to Russell who states that patients who submitted a renewal application from January 2018 to October 1st 2018 will receive a year end form by mail to complete and fax or mail in.(Provider portion not required). If a renewal application has not been submitted, the patient will be required to submit a full application. Patient should receive the document around the first week of November and should return it as soon as possible. If they have not received the form by Thanksgiving, they need to call the foundation. [phone])  Called patient to inform. A renewal application has been submitted so a year end form will be sent for the pt to fill out and send back. Patient voiced understanding and denied any questions at this time.  Amijah Timothy, [place], CPhT 9:50 AM

## 2017-03-25 ENCOUNTER — Encounter: Payer: Self-pay | Admitting: Rheumatology

## 2017-03-25 ENCOUNTER — Ambulatory Visit (INDEPENDENT_AMBULATORY_CARE_PROVIDER_SITE_OTHER): Payer: PPO | Admitting: Rheumatology

## 2017-03-25 VITALS — BP 113/66 | HR 56 | Ht 63.0 in | Wt 178.0 lb

## 2017-03-25 DIAGNOSIS — R7989 Other specified abnormal findings of blood chemistry: Secondary | ICD-10-CM

## 2017-03-25 DIAGNOSIS — R944 Abnormal results of kidney function studies: Secondary | ICD-10-CM | POA: Diagnosis not present

## 2017-03-25 DIAGNOSIS — M170 Bilateral primary osteoarthritis of knee: Secondary | ICD-10-CM

## 2017-03-25 DIAGNOSIS — Z79899 Other long term (current) drug therapy: Secondary | ICD-10-CM | POA: Diagnosis not present

## 2017-03-25 DIAGNOSIS — M0579 Rheumatoid arthritis with rheumatoid factor of multiple sites without organ or systems involvement: Secondary | ICD-10-CM

## 2017-03-25 NOTE — Progress Notes (Signed)
Office Visit Note  Patient: Kaylee Rasmussen             Date of Birth: 1951-02-21           MRN: [id number]             PCP: Nicoletta Dress, MD Referring: Nicoletta Dress, MD Visit Date: 03/25/2017 Occupation: '@GUAROCC'$ @    Subjective:  Follow-up (no issues, patient states everything going great )   History of Present Illness: Kaylee Rasmussen is a 66 y.o. female   with a history of rheumatoid arthritis positive rheumatoid factor. Patient has been taking her [place] @ WEEKS WITH GOOD RESULTS> No joint pain, swelling, stiffness.  She does wonder whether she can take her pneumonia shot and flu shot. I advised the patient that she should discontinue her Humira for a few days, get her flu shot and her pneumonia shot, and then restart her Humira 2 weeks after her immunization. Patient understands and is agreeable  Activities of Daily Living:  Patient reports morning stiffness for 15 minutes.   Patient Denies nocturnal pain.  Difficulty dressing/grooming: Denies Difficulty climbing stairs: Denies Difficulty getting out of chair: Denies Difficulty using hands for taps, buttons, cutlery, and/or writing: Denies   Review of Systems  Constitutional: Negative for fatigue.  HENT: Negative for mouth sores and mouth dryness.   Eyes: Negative for dryness.  Respiratory: Negative for shortness of breath.   Gastrointestinal: Negative for constipation and diarrhea.  Musculoskeletal: Negative for myalgias and myalgias.  Skin: Negative for sensitivity to sunlight.  Psychiatric/Behavioral: Negative for decreased concentration and sleep disturbance.    PMFS History:  There are no active problems to display for this patient.   Past Medical History:  Diagnosis Date  . High cholesterol   . Hypertension   . Rheumatoid arthritis ([place])     Family History  Problem Relation Age of Onset  . Diabetes Mother   . Heart attack Mother   . Heart attack Father    Past Surgical History:    Procedure Laterality Date  . ABDOMINAL HYSTERECTOMY    . HIP ARTHROPLASTY    . Lumbarectomy     Social History   Social History Narrative  . No narrative on file     Objective: Vital Signs: BP 113/66 (BP Location: Left Arm, Patient Position: Sitting, Cuff Size: Normal)   Pulse (!) 56   Ht '5\' 3"'$  (1.6 m)   Wt 178 lb (80.7 kg)   BMI 31.53 kg/m    Physical Exam  Constitutional: She is oriented to person, place, and time. She appears well-developed and well-nourished.  HENT:  Head: Normocephalic and atraumatic.  Eyes: Pupils are equal, round, and reactive to light. EOM are normal.  Cardiovascular: Normal rate, regular rhythm and normal heart sounds.  Exam reveals no gallop and no friction rub.   No murmur heard. Pulmonary/Chest: Effort normal and breath sounds normal. She has no wheezes. She has no rales.  Abdominal: Soft. Bowel sounds are normal. She exhibits no distension. There is no tenderness. There is no guarding. No hernia.  Musculoskeletal: Normal range of motion. She exhibits no edema, tenderness or deformity.  Lymphadenopathy:    She has no cervical adenopathy.  Neurological: She is alert and oriented to person, place, and time. Coordination normal.  Skin: Skin is warm and dry. Capillary refill takes less than 2 seconds. No rash noted.  Psychiatric: She has a normal mood and affect. Her behavior is normal.  Nursing note  and vitals reviewed.    Musculoskeletal Exam:  Full range of motion of all joints Grip strength is equal and strong bilaterally Fibromyalgia tender points are all absent  CDAI Exam: CDAI Homunculus Exam:   Joint Counts:  CDAI Tender Joint count: 0 CDAI Swollen Joint count: 0   No synovitis on examination  Investigation: No additional findings.   Labs are up-to-date and normal as of 02/25/2017;She'll be due for repeat labs in December that include CBC with differential and CMP with GFR TB goal will also be due December 2018.  Orders  Only on 02/25/2017  Component Date Value Ref Range Status  . WBC 02/25/2017 7.3  3.8 - 10.8 Thousand/uL Final  . RBC 02/25/2017 4.77  3.80 - 5.10 Million/uL Final  . Hemoglobin 02/25/2017 14.3  11.7 - 15.5 g/dL Final  . HCT 02/25/2017 41.8  35.0 - 45.0 % Final  . MCV 02/25/2017 87.6  80.0 - 100.0 fL Final  . MCH 02/25/2017 30.0  27.0 - 33.0 pg Final  . MCHC 02/25/2017 34.2  32.0 - 36.0 g/dL Final  . RDW 02/25/2017 12.7  11.0 - 15.0 % Final  . Platelets 02/25/2017 272  140 - 400 Thousand/uL Final  . MPV 02/25/2017 10.8  7.5 - 12.5 fL Final  . Neutro Abs 02/25/2017 2898  1,500 - 7,800 cells/uL Final  . Lymphs Abs 02/25/2017 3234  850 - 3,900 cells/uL Final  . WBC mixed population 02/25/2017 686  200 - 950 cells/uL Final  . Eosinophils Absolute 02/25/2017 445  15 - 500 cells/uL Final  . Basophils Absolute 02/25/2017 37  0 - 200 cells/uL Final  . Neutrophils Relative % 02/25/2017 39.7  % Final  . Total Lymphocyte 02/25/2017 44.3  % Final  . Monocytes Relative 02/25/2017 9.4  % Final  . Eosinophils Relative 02/25/2017 6.1  % Final  . Basophils Relative 02/25/2017 0.5  % Final  . Glucose, Bld 02/25/2017 103* 65 - 99 mg/dL Final   Comment: .            Fasting reference interval . For someone without known diabetes, a glucose value between 100 and 125 mg/dL is consistent with prediabetes and should be confirmed with a follow-up test. .   . BUN 02/25/2017 28* 7 - 25 mg/dL Final  . Creat 02/25/2017 1.21* 0.50 - 0.99 mg/dL Final   Comment: For patients >14 years of age, the reference limit for Creatinine is approximately 13% higher for people identified as African-American. .   . GFR, Est Non African American 02/25/2017 47* > OR = 60 mL/min/1.63m Final  . GFR, Est African American 02/25/2017 54* > OR = 60 mL/min/1.74mFinal  . BUN/Creatinine Ratio 02/25/2017 23* 6 - 22 (calc) Final  . Sodium 02/25/2017 137  135 - 146 mmol/L Final  . Potassium 02/25/2017 3.8  3.5 - 5.3 mmol/L Final   . Chloride 02/25/2017 99  98 - 110 mmol/L Final  . CO2 02/25/2017 24  20 - 32 mmol/L Final  . Calcium 02/25/2017 9.0  8.6 - 10.4 mg/dL Final  . Total Protein 02/25/2017 6.9  6.1 - 8.1 g/dL Final  . Albumin 02/25/2017 3.6  3.6 - 5.1 g/dL Final  . Globulin 02/25/2017 3.3  1.9 - 3.7 g/dL (calc) Final  . AG Ratio 02/25/2017 1.1  1.0 - 2.5 (calc) Final  . Total Bilirubin 02/25/2017 0.6  0.2 - 1.2 mg/dL Final  . Alkaline phosphatase (APISO) 02/25/2017 61  33 - 130 U/L Final  . AST 02/25/2017 32  10 - 35 U/L Final  . ALT 02/25/2017 19  6 - 29 U/L Final  Orders Only on 11/24/2016  Component Date Value Ref Range Status  . WBC 11/24/2016 7.4  3.8 - 10.8 K/uL Final  . RBC 11/24/2016 4.80  3.80 - 5.10 MIL/uL Final  . Hemoglobin 11/24/2016 14.2  11.7 - 15.5 g/dL Final  . HCT 11/24/2016 43.4  35.0 - 45.0 % Final  . MCV 11/24/2016 90.4  80.0 - 100.0 fL Final  . MCH 11/24/2016 29.6  27.0 - 33.0 pg Final  . MCHC 11/24/2016 32.7  32.0 - 36.0 g/dL Final  . RDW 11/24/2016 14.3  11.0 - 15.0 % Final  . Platelets 11/24/2016 268  140 - 400 K/uL Final  . MPV 11/24/2016 9.5  7.5 - 12.5 fL Final  . Neutro Abs 11/24/2016 2664  1,500 - 7,800 cells/uL Final  . Lymphs Abs 11/24/2016 3404  850 - 3,900 cells/uL Final  . Monocytes Absolute 11/24/2016 1036* 200 - 950 cells/uL Final  . Eosinophils Absolute 11/24/2016 296  15 - 500 cells/uL Final  . Basophils Absolute 11/24/2016 0  0 - 200 cells/uL Final  . Neutrophils Relative % 11/24/2016 36  % Final  . Lymphocytes Relative 11/24/2016 46  % Final  . Monocytes Relative 11/24/2016 14  % Final  . Eosinophils Relative 11/24/2016 4  % Final  . Basophils Relative 11/24/2016 0  % Final  . Smear Review 11/24/2016 Criteria for review not met   Final  . Sodium 11/24/2016 142  135 - 146 mmol/L Final  . Potassium 11/24/2016 4.5  3.5 - 5.3 mmol/L Final  . Chloride 11/24/2016 108  98 - 110 mmol/L Final  . CO2 11/24/2016 21  20 - 31 mmol/L Final  . Glucose, Bld 11/24/2016 81   65 - 99 mg/dL Final  . BUN 11/24/2016 20  7 - 25 mg/dL Final  . Creat 11/24/2016 1.12* 0.50 - 0.99 mg/dL Final   Comment:   For patients > or = 65 years of age: The upper reference limit for Creatinine is approximately 13% higher for people identified as African-American.     . Total Bilirubin 11/24/2016 0.6  0.2 - 1.2 mg/dL Final  . Alkaline Phosphatase 11/24/2016 75  33 - 130 U/L Final  . AST 11/24/2016 28  10 - 35 U/L Final  . ALT 11/24/2016 29  6 - 29 U/L Final  . Total Protein 11/24/2016 6.7  6.1 - 8.1 g/dL Final  . Albumin 11/24/2016 3.5* 3.6 - 5.1 g/dL Final  . Calcium 11/24/2016 8.7  8.6 - 10.4 mg/dL Final  . GFR, Est African American 11/24/2016 60  >=60 mL/min Final  . GFR, Est Non African American 11/24/2016 52* >=60 mL/min Final     Imaging: No results found.  Speciality Comments: No specialty comments available.    Procedures:  No procedures performed Allergies: Scallops [shellfish allergy]   Assessment / Plan:     Visit Diagnoses: Rheumatoid arthritis with rheumatoid factor of multiple sites without organ or systems involvement (HCC)  High risk medications (not anticoagulants) long-term use  Primary osteoarthritis of both knees  Elevated serum creatinine  Decreased GFR   Plan: #1: Rheumatoid arthritis with positive rheumatoid factor Patient is doing really well with her RA. Adequate control with Humira every 2 weeks.  #2 high risk prescription Labs are normal as of 02/25/2017. CBC with differential and CMP with GFR will be due again in December 2018 along with her TB goal. I have put  in the order.  #3: Decreased kidney function. Increased creatinine of 1.12, decreased GFR in the mid 40s. I've advised the patient to drink adequate amount of water. I've also advised the patient to minimize medications that could affect the kidneys. (NSAID's) when possible.  #4: OA of the hands.; OA of the knees. Ongoing occasional discomfort.  #5: Return to  clinic in 5 months  #6: Return to clinic in 3 months for CBC with differential, CMP with GFR, TB gold.  Orders: No orders of the defined types were placed in this encounter.  No orders of the defined types were placed in this encounter.   Face-to-face time spent with patient was 30 minutes. 50% of time was spent in counseling and coordination of care.  Follow-Up Instructions: Return in about 5 months (around 08/23/2017) for RA +RF, humira q 2wks,decr kidney fnx, oakj.   Eliezer Lofts, PA-C  Note - This record has been created using [employer].  Chart creation errors have been sought, but may not always  have been located. Such creation errors do not reflect on  the standard of medical care.

## 2017-03-26 ENCOUNTER — Ambulatory Visit: Payer: BLUE CROSS/BLUE SHIELD | Admitting: Rheumatology

## 2017-04-07 DIAGNOSIS — J208 Acute bronchitis due to other specified organisms: Secondary | ICD-10-CM | POA: Diagnosis not present

## 2017-04-07 DIAGNOSIS — Z6831 Body mass index (BMI) 31.0-31.9, adult: Secondary | ICD-10-CM | POA: Diagnosis not present

## 2017-04-08 DIAGNOSIS — Z1231 Encounter for screening mammogram for malignant neoplasm of breast: Secondary | ICD-10-CM | POA: Diagnosis not present

## 2017-04-08 DIAGNOSIS — M8589 Other specified disorders of bone density and structure, multiple sites: Secondary | ICD-10-CM | POA: Diagnosis not present

## 2017-04-21 DIAGNOSIS — E039 Hypothyroidism, unspecified: Secondary | ICD-10-CM | POA: Diagnosis not present

## 2017-04-21 DIAGNOSIS — Z6830 Body mass index (BMI) 30.0-30.9, adult: Secondary | ICD-10-CM | POA: Diagnosis not present

## 2017-04-21 DIAGNOSIS — M8589 Other specified disorders of bone density and structure, multiple sites: Secondary | ICD-10-CM | POA: Diagnosis not present

## 2017-04-21 DIAGNOSIS — Z1211 Encounter for screening for malignant neoplasm of colon: Secondary | ICD-10-CM | POA: Diagnosis not present

## 2017-04-21 DIAGNOSIS — E785 Hyperlipidemia, unspecified: Secondary | ICD-10-CM | POA: Diagnosis not present

## 2017-04-21 DIAGNOSIS — I10 Essential (primary) hypertension: Secondary | ICD-10-CM | POA: Diagnosis not present

## 2017-04-21 DIAGNOSIS — Z0000 Encounter for general adult medical examination without abnormal findings: Secondary | ICD-10-CM | POA: Diagnosis not present

## 2017-04-21 DIAGNOSIS — M069 Rheumatoid arthritis, unspecified: Secondary | ICD-10-CM | POA: Diagnosis not present

## 2017-04-21 DIAGNOSIS — Z23 Encounter for immunization: Secondary | ICD-10-CM | POA: Diagnosis not present

## 2017-04-23 DIAGNOSIS — J45909 Unspecified asthma, uncomplicated: Secondary | ICD-10-CM | POA: Diagnosis not present

## 2017-05-06 ENCOUNTER — Telehealth: Payer: Self-pay

## 2017-05-06 NOTE — Telephone Encounter (Signed)
Received a fax from [place] patient assistance stating that the patient has been approved to receive HUMIRA from the foundation through 06/22/2018.  Will send document to scan center.   Called patient to update. Phone line was busy. Could not leave an answer.   Laurana Magistro, [place], CPhT 10:21 AM

## 2017-05-13 ENCOUNTER — Other Ambulatory Visit: Payer: Self-pay | Admitting: *Deleted

## 2017-05-13 MED ORDER — ADALIMUMAB 40 MG/0.8ML ~~LOC~~ AJKT: 40.0000 mg | AUTO-INJECTOR | SUBCUTANEOUS | 0 refills | Status: DC

## 2017-05-13 NOTE — Telephone Encounter (Signed)
Refill request received via [employer].   Last Visit: 03/25/17 Next Visit: 08/27/17 Labs:02/25/17 stable TB Gold: 03/09/16 Neg  Okay to refill per Dr. Estanislado Pandy

## 2017-05-20 DIAGNOSIS — Z136 Encounter for screening for cardiovascular disorders: Secondary | ICD-10-CM | POA: Diagnosis not present

## 2017-05-20 DIAGNOSIS — Z0000 Encounter for general adult medical examination without abnormal findings: Secondary | ICD-10-CM | POA: Diagnosis not present

## 2017-05-20 DIAGNOSIS — E785 Hyperlipidemia, unspecified: Secondary | ICD-10-CM | POA: Diagnosis not present

## 2017-05-20 DIAGNOSIS — Z9181 History of falling: Secondary | ICD-10-CM | POA: Diagnosis not present

## 2017-05-20 DIAGNOSIS — E669 Obesity, unspecified: Secondary | ICD-10-CM | POA: Diagnosis not present

## 2017-05-20 DIAGNOSIS — Z1331 Encounter for screening for depression: Secondary | ICD-10-CM | POA: Diagnosis not present

## 2017-05-20 DIAGNOSIS — Z6832 Body mass index (BMI) 32.0-32.9, adult: Secondary | ICD-10-CM | POA: Diagnosis not present

## 2017-05-20 DIAGNOSIS — Z1211 Encounter for screening for malignant neoplasm of colon: Secondary | ICD-10-CM | POA: Diagnosis not present

## 2017-06-04 ENCOUNTER — Other Ambulatory Visit: Payer: Self-pay

## 2017-06-04 ENCOUNTER — Other Ambulatory Visit: Payer: Self-pay | Admitting: *Deleted

## 2017-06-04 DIAGNOSIS — Z9225 Personal history of immunosupression therapy: Secondary | ICD-10-CM | POA: Diagnosis not present

## 2017-06-04 DIAGNOSIS — Z79899 Other long term (current) drug therapy: Secondary | ICD-10-CM

## 2017-06-05 NOTE — Progress Notes (Signed)
Labs are stable. Her GFR is low most likely related to diuretic use. TB gold is still pending.

## 2017-06-06 LAB — QUANTIFERON TB GOLD ASSAY (BLOOD)
MITOGEN-NIL SO: 6 [IU]/mL
QUANTIFERON TB AG MINUS NIL: 0.03 [IU]/mL
QUANTIFERON(R)-TB GOLD: NEGATIVE
Quantiferon Nil Value: 0.06 IU/mL

## 2017-06-06 LAB — CBC WITH DIFFERENTIAL/PLATELET
BASOS PCT: 0.8 %
Basophils Absolute: 50 cells/uL (ref 0–200)
EOS ABS: 130 {cells}/uL (ref 15–500)
Eosinophils Relative: 2.1 %
HCT: 42 % (ref 35.0–45.0)
Hemoglobin: 14.4 g/dL (ref 11.7–15.5)
Lymphs Abs: 2846 cells/uL (ref 850–3900)
MCH: 30.8 pg (ref 27.0–33.0)
MCHC: 34.3 g/dL (ref 32.0–36.0)
MCV: 89.7 fL (ref 80.0–100.0)
MONOS PCT: 8.4 %
MPV: 9.6 fL (ref 7.5–12.5)
Neutro Abs: 2654 cells/uL (ref 1500–7800)
Neutrophils Relative %: 42.8 %
PLATELETS: 286 10*3/uL (ref 140–400)
RBC: 4.68 10*6/uL (ref 3.80–5.10)
RDW: 14.4 % (ref 11.0–15.0)
TOTAL LYMPHOCYTE: 45.9 %
WBC mixed population: 521 cells/uL (ref 200–950)
WBC: 6.2 10*3/uL (ref 3.8–10.8)

## 2017-06-06 LAB — COMPLETE METABOLIC PANEL WITH GFR
AG Ratio: 1.2 (calc) (ref 1.0–2.5)
ALT: 21 U/L (ref 6–29)
AST: 25 U/L (ref 10–35)
Albumin: 3.7 g/dL (ref 3.6–5.1)
Alkaline phosphatase (APISO): 57 U/L (ref 33–130)
BUN/Creatinine Ratio: 22 (calc) (ref 6–22)
BUN: 28 mg/dL — ABNORMAL HIGH (ref 7–25)
CO2: 27 mmol/L (ref 20–32)
Calcium: 9.4 mg/dL (ref 8.6–10.4)
Chloride: 103 mmol/L (ref 98–110)
Creat: 1.3 mg/dL — ABNORMAL HIGH (ref 0.50–0.99)
GFR, Est African American: 50 mL/min/{1.73_m2} — ABNORMAL LOW (ref >=60)
GFR, Est Non African American: 43 mL/min/{1.73_m2} — ABNORMAL LOW (ref >=60)
GLOBULIN: 3.1 g/dL (ref 1.9–3.7)
Glucose, Bld: 116 mg/dL — ABNORMAL HIGH (ref 65–99)
POTASSIUM: 3.6 mmol/L (ref 3.5–5.3)
SODIUM: 141 mmol/L (ref 135–146)
Total Bilirubin: 0.7 mg/dL (ref 0.2–1.2)
Total Protein: 6.8 g/dL (ref 6.1–8.1)

## 2017-07-22 DIAGNOSIS — I10 Essential (primary) hypertension: Secondary | ICD-10-CM | POA: Diagnosis not present

## 2017-07-22 DIAGNOSIS — M069 Rheumatoid arthritis, unspecified: Secondary | ICD-10-CM | POA: Diagnosis not present

## 2017-07-22 DIAGNOSIS — M8589 Other specified disorders of bone density and structure, multiple sites: Secondary | ICD-10-CM | POA: Diagnosis not present

## 2017-07-22 DIAGNOSIS — E785 Hyperlipidemia, unspecified: Secondary | ICD-10-CM | POA: Diagnosis not present

## 2017-07-22 DIAGNOSIS — Z1211 Encounter for screening for malignant neoplasm of colon: Secondary | ICD-10-CM | POA: Diagnosis not present

## 2017-07-22 DIAGNOSIS — E039 Hypothyroidism, unspecified: Secondary | ICD-10-CM | POA: Diagnosis not present

## 2017-07-22 DIAGNOSIS — Z6831 Body mass index (BMI) 31.0-31.9, adult: Secondary | ICD-10-CM | POA: Diagnosis not present

## 2017-07-28 DIAGNOSIS — Z1211 Encounter for screening for malignant neoplasm of colon: Secondary | ICD-10-CM | POA: Diagnosis not present

## 2017-07-28 DIAGNOSIS — Z86010 Personal history of colonic polyps: Secondary | ICD-10-CM | POA: Diagnosis not present

## 2017-08-12 DIAGNOSIS — Z86010 Personal history of colonic polyps: Secondary | ICD-10-CM | POA: Diagnosis not present

## 2017-08-12 DIAGNOSIS — D122 Benign neoplasm of ascending colon: Secondary | ICD-10-CM | POA: Diagnosis not present

## 2017-08-12 DIAGNOSIS — Z1211 Encounter for screening for malignant neoplasm of colon: Secondary | ICD-10-CM | POA: Diagnosis not present

## 2017-08-12 DIAGNOSIS — K5730 Diverticulosis of large intestine without perforation or abscess without bleeding: Secondary | ICD-10-CM | POA: Diagnosis not present

## 2017-08-12 DIAGNOSIS — K635 Polyp of colon: Secondary | ICD-10-CM | POA: Diagnosis not present

## 2017-08-12 DIAGNOSIS — Z79899 Other long term (current) drug therapy: Secondary | ICD-10-CM | POA: Diagnosis not present

## 2017-08-12 DIAGNOSIS — M069 Rheumatoid arthritis, unspecified: Secondary | ICD-10-CM | POA: Diagnosis not present

## 2017-08-12 DIAGNOSIS — I10 Essential (primary) hypertension: Secondary | ICD-10-CM | POA: Diagnosis not present

## 2017-08-12 DIAGNOSIS — E7800 Pure hypercholesterolemia, unspecified: Secondary | ICD-10-CM | POA: Diagnosis not present

## 2017-08-12 DIAGNOSIS — E039 Hypothyroidism, unspecified: Secondary | ICD-10-CM | POA: Diagnosis not present

## 2017-08-13 NOTE — Progress Notes (Signed)
Office Visit Note  Patient: Kaylee Rasmussen             Date of Birth: 09/11/1950           MRN: [id number]             PCP: Nicoletta Dress, MD Referring: Nicoletta Dress, MD Visit Date: 08/27/2017 Occupation: @GUAROCC @    Subjective:  Medication management.   History of Present Illness: Kaylee Rasmussen is a 67 y.o. female with history of rheumatoid arthritis and osteoarthritis overlap.  She states her arthritis has been quite well controlled on Humira.  She has been taking Humira injections every other week.  She is not having much discomfort in her knees.  Right total hip replacement is doing well.  Activities of Daily Living:  Patient reports morning stiffness for 0 minutes.   Patient Denies nocturnal pain.  Difficulty dressing/grooming: Denies Difficulty climbing stairs: Denies Difficulty getting out of chair: Denies Difficulty using hands for taps, buttons, cutlery, and/or writing: Denies   Review of Systems  Constitutional: Positive for fatigue. Negative for night sweats, weight gain, weight loss and weakness.  HENT: Positive for mouth dryness. Negative for mouth sores, trouble swallowing, trouble swallowing and nose dryness.   Eyes: Positive for dryness. Negative for pain, redness and visual disturbance.  Respiratory: Negative for cough, shortness of breath and difficulty breathing.   Cardiovascular: Negative for chest pain, palpitations, hypertension, irregular heartbeat and swelling in legs/feet.  Gastrointestinal: Negative for blood in stool, constipation, diarrhea and nausea.  Endocrine: Negative for increased urination.  Genitourinary: Negative for pelvic pain and vaginal dryness.  Musculoskeletal: Negative for arthralgias, joint pain, joint swelling, myalgias, muscle weakness, morning stiffness, muscle tenderness and myalgias.  Skin: Negative for color change, rash, hair loss, redness, skin tightness, ulcers and sensitivity to sunlight.  Allergic/Immunologic:  Negative for susceptible to infections.  Neurological: Negative for dizziness, numbness, headaches, memory loss and night sweats.  Hematological: Negative for bruising/bleeding tendency and swollen glands.  Psychiatric/Behavioral: Positive for depressed mood. Negative for sleep disturbance. The patient is not nervous/anxious.     PMFS History:  There are no active problems to display for this patient.   Past Medical History:  Diagnosis Date  . High cholesterol   . Hypertension   . Rheumatoid arthritis ([place])     Family History  Problem Relation Age of Onset  . Diabetes Mother   . Heart attack Mother   . Heart attack Father    Past Surgical History:  Procedure Laterality Date  . ABDOMINAL HYSTERECTOMY    . HIP ARTHROPLASTY    . Lumbarectomy     Social History   Social History Narrative  . Not on file     Objective: Vital Signs: BP 110/60 (BP Location: Left Arm, Patient Position: Sitting, Cuff Size: Normal)   Pulse 71   Resp 14   Ht 5\' 2"  (1.575 m)   Wt 177 lb (80.3 kg)   BMI 32.37 kg/m    Physical Exam  Constitutional: She is oriented to person, place, and time. She appears well-developed and well-nourished.  HENT:  Head: Normocephalic and atraumatic.  Eyes: Conjunctivae and EOM are normal.  Neck: Normal range of motion.  Cardiovascular: Normal rate, regular rhythm, normal heart sounds and intact distal pulses.  Pulmonary/Chest: Effort normal and breath sounds normal.  Abdominal: Soft. Bowel sounds are normal.  Lymphadenopathy:    She has no cervical adenopathy.  Neurological: She is alert and oriented to person, place, and  time.  Skin: Skin is warm and dry. Capillary refill takes less than 2 seconds.  Psychiatric: She has a normal mood and affect. Her behavior is normal.  Nursing note and vitals reviewed.    Musculoskeletal Exam: C-spine thoracic lumbar spine good range of motion.  Shoulder joints elbow joints wrist joints are good range of motion.  She is  some synovial thickening over MCP joints but no synovitis was noted.  Hip joints knee joints ankles MTPs PIPs are good range of motion with no synovitis.  CDAI Exam: CDAI Homunculus Exam:   Joint Counts:  CDAI Tender Joint count: 0 CDAI Swollen Joint count: 0  Global Assessments:  Patient Global Assessment: 0 Provider Global Assessment: 0    Investigation: No additional findings.TB gold: 06/04/2017 CBC Latest Ref Rng & Units 06/04/2017 02/25/2017 11/24/2016  WBC 3.8 - 10.8 Thousand/uL 6.2 7.3 7.4  Hemoglobin 11.7 - 15.5 g/dL 14.4 14.3 14.2  Hematocrit 35.0 - 45.0 % 42.0 41.8 43.4  Platelets 140 - 400 Thousand/uL 286 272 268   CMP Latest Ref Rng & Units 06/04/2017 02/25/2017 11/24/2016  Glucose 65 - 99 mg/dL 116(H) 103(H) 81  BUN 7 - 25 mg/dL 28(H) 28(H) 20  Creatinine 0.50 - 0.99 mg/dL 1.30(H) 1.21(H) 1.12(H)  Sodium 135 - 146 mmol/L 141 137 142  Potassium 3.5 - 5.3 mmol/L 3.6 3.8 4.5  Chloride 98 - 110 mmol/L 103 99 108  CO2 20 - 32 mmol/L 27 24 21   Calcium 8.6 - 10.4 mg/dL 9.4 9.0 8.7  Total Protein 6.1 - 8.1 g/dL 6.8 6.9 6.7  Total Bilirubin 0.2 - 1.2 mg/dL 0.7 0.6 0.6  Alkaline Phos 33 - 130 U/L - - 75  AST 10 - 35 U/L 25 32 28  ALT 6 - 29 U/L 21 19 29     Imaging: Xr Foot 2 Views Left  Result Date: 08/27/2017 First MTP narrowing and PIP/ DIP narrowing was noted.  Erosive changes noted in first and fifth MTP joints.  Juxta-articular osteopenia noted.  Intertarsal joint space narrowing was noted.  No radiographic progression was noted from 2014. Impression: These findings are consistent with rheumatoid arthritis and osteoarthritis overlap.  Xr Foot 2 Views Right  Result Date: 08/27/2017 First MTP, fourth and fifth MTP narrowing was noted.  Erosive changes were noted in first or fourth and fifth MTP joints.  All PIP and DIP narrowing was noted.  Intertarsal joint space narrowing was noted.  Mild subtalar joint space narrowing was noted.  There was no radiographic progression from  her x-rays of 2014. Impression: These findings are consistent with rheumatoid arthritis and osteoarthritis overlap.  Xr Hand 2 View Left  Result Date: 08/27/2017 Juxta-articular osteopenia was noted.  Severe first and second MCP joint narrowing was noted.  Third fourth and fifth MCP joint narrowing was noted.  All PIP and DIP joint space narrowing was noted.  Intercarpal and radiocarpal joints severe narrowing was noted.  Juxta-articular osteopenia was noted.  There was no radiographic progression from the x-rays of 2014. Impression: These findings are consistent with rheumatoid arthritis.  Xr Hand 2 View Right  Result Date: 08/27/2017 Juxta-articular osteopenia was noted.  All MCP joint narrowing was noted.  Most severe in the first and second MCP joint.  All PIP and DIP narrowing was noted.  Severe intercarpal and radiocarpal joint space narrowing was noted.  There was no radiographic progression from 2014. Impression: These findings are consistent with rheumatoid arthritis.   Speciality Comments: No specialty comments available.  Procedures:  No procedures performed Allergies: Scallops [shellfish allergy]   Assessment / Plan:     Visit Diagnoses: Rheumatoid arthritis involving multiple sites with positive rheumatoid factor (HCC) - erosive disease with nodulosis -she does not have any synovitis on examination.  She is some synovial thickening.  She does not have nodulosis.  She has been tolerating Humira well.  I will obtain following x-rays to monitor for any radiographic progression.  Plan: XR Hand 2 View Right, XR Hand 2 View Left, XR Foot 2 Views Right, XR Foot 2 Views Left.  There was no radiographic progression noted on the x-rays.  High risk medication use - Humira 40 mg sq q owk - Plan: CBC with Differential/Platelet, COMPLETE METABOLIC PANEL WITH GFR, today and then every 3 months.  COMPLETE METABOLIC PANEL WITH GFR, CBC with Differential/Platelet  Primary osteoarthritis of both  knees: She is not having any symptoms currently.  History of total hip replacement, right: Doing well. Renal calcinosis  History of hypertension: Her blood pressure is well controlled.  History of hypothyroidism  History of hypercholesterolemia    Orders: Orders Placed This Encounter  Procedures  . XR Hand 2 View Right  . XR Hand 2 View Left  . XR Foot 2 Views Right  . XR Foot 2 Views Left  . CBC with Differential/Platelet  . COMPLETE METABOLIC PANEL WITH GFR  . COMPLETE METABOLIC PANEL WITH GFR  . CBC with Differential/Platelet   No orders of the defined types were placed in this encounter.   Face-to-face time spent with patient was 30 minutes.  Greater than 50% of time was spent in counseling and coordination of care.  Follow-Up Instructions: Return in about 5 months (around 01/27/2018) for Rheumatoid arthritis, Osteoarthritis.   Bo Merino, MD  Note - This record has been created using Editor, commissioning.  Chart creation errors have been sought, but may not always  have been located. Such creation errors do not reflect on  the standard of medical care.

## 2017-08-27 ENCOUNTER — Encounter: Payer: Self-pay | Admitting: Rheumatology

## 2017-08-27 ENCOUNTER — Ambulatory Visit (INDEPENDENT_AMBULATORY_CARE_PROVIDER_SITE_OTHER): Payer: PPO

## 2017-08-27 ENCOUNTER — Ambulatory Visit (INDEPENDENT_AMBULATORY_CARE_PROVIDER_SITE_OTHER): Payer: Self-pay

## 2017-08-27 ENCOUNTER — Ambulatory Visit: Payer: PPO | Admitting: Rheumatology

## 2017-08-27 VITALS — BP 110/60 | HR 71 | Resp 14 | Ht 62.0 in | Wt 177.0 lb

## 2017-08-27 DIAGNOSIS — Z96641 Presence of right artificial hip joint: Secondary | ICD-10-CM

## 2017-08-27 DIAGNOSIS — M0579 Rheumatoid arthritis with rheumatoid factor of multiple sites without organ or systems involvement: Secondary | ICD-10-CM

## 2017-08-27 DIAGNOSIS — M170 Bilateral primary osteoarthritis of knee: Secondary | ICD-10-CM

## 2017-08-27 DIAGNOSIS — N29 Other disorders of kidney and ureter in diseases classified elsewhere: Secondary | ICD-10-CM

## 2017-08-27 DIAGNOSIS — Z79899 Other long term (current) drug therapy: Secondary | ICD-10-CM | POA: Diagnosis not present

## 2017-08-27 DIAGNOSIS — Z8679 Personal history of other diseases of the circulatory system: Secondary | ICD-10-CM

## 2017-08-27 DIAGNOSIS — Z8639 Personal history of other endocrine, nutritional and metabolic disease: Secondary | ICD-10-CM

## 2017-08-27 LAB — CBC WITH DIFFERENTIAL/PLATELET
BASOS PCT: 0.9 %
Basophils Absolute: 61 cells/uL (ref 0–200)
Eosinophils Absolute: 272 cells/uL (ref 15–500)
Eosinophils Relative: 4 %
HCT: 39 % (ref 35.0–45.0)
HEMOGLOBIN: 13.6 g/dL (ref 11.7–15.5)
Lymphs Abs: 2972 cells/uL (ref 850–3900)
MCH: 32.3 pg (ref 27.0–33.0)
MCHC: 34.9 g/dL (ref 32.0–36.0)
MCV: 92.6 fL (ref 80.0–100.0)
MONOS PCT: 11.5 %
MPV: 10.2 fL (ref 7.5–12.5)
NEUTROS ABS: 2713 {cells}/uL (ref 1500–7800)
Neutrophils Relative %: 39.9 %
PLATELETS: 231 10*3/uL (ref 140–400)
RBC: 4.21 10*6/uL (ref 3.80–5.10)
RDW: 12.2 % (ref 11.0–15.0)
TOTAL LYMPHOCYTE: 43.7 %
WBC: 6.8 10*3/uL (ref 3.8–10.8)
WBCMIX: 782 {cells}/uL (ref 200–950)

## 2017-08-27 LAB — COMPLETE METABOLIC PANEL WITH GFR
AG Ratio: 1.2 (calc) (ref 1.0–2.5)
ALBUMIN MSPROF: 3.6 g/dL (ref 3.6–5.1)
ALT: 18 U/L (ref 6–29)
AST: 26 U/L (ref 10–35)
Alkaline phosphatase (APISO): 51 U/L (ref 33–130)
BUN/Creatinine Ratio: 16 (calc) (ref 6–22)
BUN: 19 mg/dL (ref 7–25)
CALCIUM: 9.1 mg/dL (ref 8.6–10.4)
CO2: 32 mmol/L (ref 20–32)
Chloride: 103 mmol/L (ref 98–110)
Creat: 1.16 mg/dL — ABNORMAL HIGH (ref 0.50–0.99)
GFR, EST AFRICAN AMERICAN: 57 mL/min/{1.73_m2} — AB (ref >=60)
GFR, EST NON AFRICAN AMERICAN: 49 mL/min/{1.73_m2} — AB (ref >=60)
GLUCOSE: 79 mg/dL (ref 65–99)
Globulin: 3 g/dL (calc) (ref 1.9–3.7)
Potassium: 3.9 mmol/L (ref 3.5–5.3)
Sodium: 142 mmol/L (ref 135–146)
TOTAL PROTEIN: 6.6 g/dL (ref 6.1–8.1)
Total Bilirubin: 0.5 mg/dL (ref 0.2–1.2)

## 2017-08-27 NOTE — Patient Instructions (Signed)
Standing Labs We placed an order today for your standing lab work.    Please come back and get your standing labs in June and every 3 months  We have open lab Monday through Friday from 8:30-11:30 AM and 1:30-4 PM at the office of Dr. Bo Merino.   The office is located at [address] [place], [place], [place] [address] No appointment is necessary.   Labs are drawn by [facility].  You may receive a bill from [facility] for your lab work. If you have any questions regarding directions or hours of operation,  please call [phone].

## 2017-08-28 NOTE — Progress Notes (Signed)
stable °

## 2017-09-11 ENCOUNTER — Telehealth: Payer: Self-pay | Admitting: Rheumatology

## 2017-09-11 NOTE — Telephone Encounter (Signed)
[employer] calling requesting refill on patients Humira. Please call with verbal, ask to speak with a pharmacist. Fax# [phone]

## 2017-09-14 NOTE — Telephone Encounter (Signed)
Last Visit: 08/27/17 Next Visit: 01/27/18 Labs: 08/27/17 Stable TB Gold: 06/04/17 Neg   Okay to refill per Dr. Gwenlyn Found and left verba prescription on pharmacy voice mail.

## 2017-12-16 ENCOUNTER — Other Ambulatory Visit: Payer: Self-pay

## 2017-12-16 DIAGNOSIS — Z79899 Other long term (current) drug therapy: Secondary | ICD-10-CM

## 2017-12-16 LAB — COMPLETE METABOLIC PANEL WITH GFR
AG RATIO: 1 (calc) (ref 1.0–2.5)
ALT: 14 U/L (ref 6–29)
AST: 26 U/L (ref 10–35)
Albumin: 3.5 g/dL — ABNORMAL LOW (ref 3.6–5.1)
Alkaline phosphatase (APISO): 57 U/L (ref 33–130)
BILIRUBIN TOTAL: 0.6 mg/dL (ref 0.2–1.2)
BUN/Creatinine Ratio: 19 (calc) (ref 6–22)
BUN: 28 mg/dL — ABNORMAL HIGH (ref 7–25)
CALCIUM: 9.3 mg/dL (ref 8.6–10.4)
CHLORIDE: 101 mmol/L (ref 98–110)
CO2: 30 mmol/L (ref 20–32)
Creat: 1.51 mg/dL — ABNORMAL HIGH (ref 0.50–0.99)
GFR, EST AFRICAN AMERICAN: 42 mL/min/{1.73_m2} — AB (ref >=60)
GFR, Est Non African American: 36 mL/min/{1.73_m2} — ABNORMAL LOW (ref >=60)
GLOBULIN: 3.5 g/dL (ref 1.9–3.7)
Glucose, Bld: 86 mg/dL (ref 65–99)
POTASSIUM: 3.7 mmol/L (ref 3.5–5.3)
Sodium: 139 mmol/L (ref 135–146)
Total Protein: 7 g/dL (ref 6.1–8.1)

## 2017-12-16 LAB — CBC WITH DIFFERENTIAL/PLATELET
BASOS ABS: 40 {cells}/uL (ref 0–200)
Basophils Relative: 0.5 %
EOS ABS: 384 {cells}/uL (ref 15–500)
Eosinophils Relative: 4.8 %
HEMATOCRIT: 37.2 % (ref 35.0–45.0)
Hemoglobin: 12.9 g/dL (ref 11.7–15.5)
LYMPHS ABS: 4016 {cells}/uL — AB (ref 850–3900)
MCH: 30.6 pg (ref 27.0–33.0)
MCHC: 34.7 g/dL (ref 32.0–36.0)
MCV: 88.4 fL (ref 80.0–100.0)
MPV: 10.3 fL (ref 7.5–12.5)
Monocytes Relative: 11.9 %
NEUTROS PCT: 32.6 %
Neutro Abs: 2608 cells/uL (ref 1500–7800)
PLATELETS: 240 10*3/uL (ref 140–400)
RBC: 4.21 10*6/uL (ref 3.80–5.10)
RDW: 13 % (ref 11.0–15.0)
TOTAL LYMPHOCYTE: 50.2 %
WBC: 8 10*3/uL (ref 3.8–10.8)
WBCMIX: 952 {cells}/uL — AB (ref 200–950)

## 2017-12-17 ENCOUNTER — Telehealth: Payer: Self-pay | Admitting: Rheumatology

## 2017-12-17 NOTE — Telephone Encounter (Signed)
Patient called stating she was returning your call regarding her bloodwork.

## 2017-12-17 NOTE — Progress Notes (Signed)
Elevated creatinine most likely due to thiazide use.  Please forward labs to her PCP.

## 2017-12-18 NOTE — Telephone Encounter (Signed)
Patient advised of lab results and copy sent to PCP.

## 2017-12-21 ENCOUNTER — Telehealth: Payer: Self-pay

## 2017-12-21 MED ORDER — ADALIMUMAB 40 MG/0.8ML ~~LOC~~ AJKT: 40.0000 mg | AUTO-INJECTOR | SUBCUTANEOUS | 0 refills | Status: DC

## 2017-12-21 NOTE — Telephone Encounter (Signed)
Received refill request via fax from [facility].   Last visit: 08/27/2017 Next visit: 01/27/2018 Labs: 12/16/2017 Elevated creatinine most likely due to thiazide use. TB Gold: 06/04/2017 Negative   Okay to refill, per Dr. Estanislado Pandy.

## 2018-01-04 DIAGNOSIS — H8113 Benign paroxysmal vertigo, bilateral: Secondary | ICD-10-CM | POA: Diagnosis not present

## 2018-01-14 NOTE — Progress Notes (Signed)
Office Visit Note  Patient: Kaylee Rasmussen             Date of Birth: [birth year]           MRN: [id number]             PCP: Nicoletta Dress, MD Referring: Nicoletta Dress, MD Visit Date: 01/27/2018 Occupation: @GUAROCC @  Subjective:  Medication management.   History of Present Illness: Kaylee Rasmussen is a 67 y.o. female with history of sero positive erosive rheumatoid arthritis.  She states she has been taking Humira on regular basis.  She is denying any joint swelling.  She states she had to come off diuretics as there was elevation of her creatinine.  She has noticed increased fluid retention.  Her right total hip replacement is doing well.  She is not having much discomfort from osteoarthritis in her knees.  Activities of Daily Living:  Patient reports morning stiffness for 0 minute.   Patient Denies nocturnal pain.  Difficulty dressing/grooming: Denies Difficulty climbing stairs: Denies Difficulty getting out of chair: Denies Difficulty using hands for taps, buttons, cutlery, and/or writing: Denies  Review of Systems  Constitutional: Positive for fatigue. Negative for night sweats, weight gain and weight loss.  HENT: Positive for mouth dryness. Negative for mouth sores, trouble swallowing, trouble swallowing and nose dryness.   Eyes: Negative for pain, redness, visual disturbance and dryness.  Respiratory: Negative for cough, shortness of breath and difficulty breathing.   Cardiovascular: Negative for chest pain, palpitations, hypertension, irregular heartbeat and swelling in legs/feet.  Gastrointestinal: Negative for blood in stool, constipation and diarrhea.  Endocrine: Negative for increased urination.  Genitourinary: Negative for vaginal dryness.  Musculoskeletal: Positive for arthralgias and joint pain. Negative for joint swelling, myalgias, muscle weakness, morning stiffness, muscle tenderness and myalgias.  Skin: Negative for color change, rash, hair loss, skin  tightness, ulcers and sensitivity to sunlight.  Allergic/Immunologic: Negative for susceptible to infections.  Neurological: Negative for dizziness, memory loss, night sweats and weakness.  Hematological: Negative for swollen glands.  Psychiatric/Behavioral: Positive for depressed mood. Negative for sleep disturbance. The patient is not nervous/anxious.     PMFS History:  Patient Active Problem List   Diagnosis Date Noted  . Rheumatoid arthritis involving multiple sites with positive rheumatoid factor ([place]) 08/27/2017  . High risk medication use 08/27/2017  . Primary osteoarthritis of both knees 08/27/2017  . History of total hip replacement, right 08/27/2017  . Renal calcinosis 08/27/2017  . History of hypertension 08/27/2017  . History of hypothyroidism 08/27/2017  . History of hypercholesterolemia 08/27/2017    Past Medical History:  Diagnosis Date  . High cholesterol   . Hypertension   . Rheumatoid arthritis ([place])   . Vertigo     Family History  Problem Relation Age of Onset  . Diabetes Mother   . Heart attack Mother   . Heart attack Father    Past Surgical History:  Procedure Laterality Date  . ABDOMINAL HYSTERECTOMY    . HIP ARTHROPLASTY    . Lumbarectomy     Social History   Social History Narrative  . Not on file    Objective: Vital Signs: BP (!) 173/82 (BP Location: Left Arm, Patient Position: Sitting, Cuff Size: Small)   Pulse (!) 59   Resp 14   Ht 5\' 2"  (1.575 m)   Wt 174 lb (78.9 kg)   BMI 31.83 kg/m    Physical Exam  Constitutional: She is oriented to person, place, and  time. She appears well-developed and well-nourished.  HENT:  Head: Normocephalic and atraumatic.  Eyes: Conjunctivae and EOM are normal.  Neck: Normal range of motion.  Cardiovascular: Normal rate, regular rhythm, normal heart sounds and intact distal pulses.  Pulmonary/Chest: Effort normal and breath sounds normal.  Abdominal: Soft. Bowel sounds are normal.  Lymphadenopathy:      She has no cervical adenopathy.  Neurological: She is alert and oriented to person, place, and time.  Skin: Skin is warm and dry. Capillary refill takes less than 2 seconds.  Psychiatric: She has a normal mood and affect. Her behavior is normal.  Nursing note and vitals reviewed.    Musculoskeletal Exam: C-spine thoracic lumbar spine good range of motion.  She has some thoracic kyphosis.  Shoulder joints were in good range of motion.  She has right elbow contracture which is old.  Wrist joint MCPs PIPs were in good range of motion.  She has some synovial thickening over bilateral MCP joints.  Right total hip replacement is doing well.  She has some crepitus in her knee joints without any warmth swelling or effusion.  CDAI Exam: CDAI Homunculus Exam:   Joint Counts:  CDAI Tender Joint count: 0 CDAI Swollen Joint count: 0  Global Assessments:  Patient Global Assessment: 1 Provider Global Assessment: 1  CDAI Calculated Score: 2   Investigation: No additional findings.  Imaging: No results found.  Recent Labs: Lab Results  Component Value Date   WBC 8.0 12/16/2017   HGB 12.9 12/16/2017   PLT 240 12/16/2017   NA 139 12/16/2017   K 3.7 12/16/2017   CL 101 12/16/2017   CO2 30 12/16/2017   GLUCOSE 86 12/16/2017   BUN 28 (H) 12/16/2017   CREATININE 1.51 (H) 12/16/2017   BILITOT 0.6 12/16/2017   ALKPHOS 75 11/24/2016   AST 26 12/16/2017   ALT 14 12/16/2017   PROT 7.0 12/16/2017   ALBUMIN 3.5 (L) 11/24/2016   CALCIUM 9.3 12/16/2017   GFRAA 42 (L) 12/16/2017   QFTBGOLD NEGATIVE 06/04/2017    Speciality Comments: No specialty comments available.  Procedures:  No procedures performed Allergies: Scallops [shellfish allergy]   Assessment / Plan:     Visit Diagnoses: Rheumatoid arthritis involving multiple sites with positive rheumatoid factor (HCC) - erosive disease with nodulosis.  Patient had no synovitis on examination today.  She continues to have some synovial  thickening.  I discussed his spacing Humira to every 3 weeks.  She stated that her last flare was in February and she would like to hold off and spacing her Humira dosing.  High risk medication use - Humira 40 mg sq q owk  - Plan: QuantiFERON-TB Gold Plus.  Her labs have been stable.  She has chronic elevation of creatinine.  We will continue to monitor her labs every 3 months.  Contracture of right elbow-unchanged.    Primary osteoarthritis of both knees-she is currently not having much discomfort in her knee joints.  History of total hip replacement, right-doing well.  Renal calcinosis  History of hypercholesterolemia  History of hypertension-her blood pressure is elevated.  Have advised to monitor blood pressure closely and follow-up with her PCP.  History of hypothyroidism   Orders: Orders Placed This Encounter  Procedures  . QuantiFERON-TB Gold Plus   No orders of the defined types were placed in this encounter.   Face-to-face time spent with patient was 30 minutes. Greater than 50% of time was spent in counseling and coordination of care.  Follow-Up  Instructions: Return in about 5 months (around 06/29/2018) for Rheumatoid arthritis, Osteoarthritis.   Bo Merino, MD  Note - This record has been created using Editor, commissioning.  Chart creation errors have been sought, but may not always  have been located. Such creation errors do not reflect on  the standard of medical care.

## 2018-01-15 ENCOUNTER — Telehealth: Payer: Self-pay | Admitting: Pharmacy Technician

## 2018-01-15 NOTE — Telephone Encounter (Signed)
Received renewal application for Humira from [place]. Completed office portion and left message for patient to call back to discuss additional required information. (income and patient signature)  2:37 PM [name], CPhT

## 2018-01-16 DIAGNOSIS — I10 Essential (primary) hypertension: Secondary | ICD-10-CM | POA: Diagnosis not present

## 2018-01-16 DIAGNOSIS — E039 Hypothyroidism, unspecified: Secondary | ICD-10-CM | POA: Diagnosis not present

## 2018-01-16 DIAGNOSIS — M8589 Other specified disorders of bone density and structure, multiple sites: Secondary | ICD-10-CM | POA: Diagnosis not present

## 2018-01-16 DIAGNOSIS — M069 Rheumatoid arthritis, unspecified: Secondary | ICD-10-CM | POA: Diagnosis not present

## 2018-01-16 DIAGNOSIS — E785 Hyperlipidemia, unspecified: Secondary | ICD-10-CM | POA: Diagnosis not present

## 2018-01-26 NOTE — Telephone Encounter (Signed)
Tried to call patient twice to follow up on [employer] paperwork. Patient's phone rang once and went silent until it disconnected. Patient has appointment tomorrow, will follow up if unable to contact via phone.  10:55 AM [name], CPhT

## 2018-01-27 ENCOUNTER — Encounter: Payer: Self-pay | Admitting: Rheumatology

## 2018-01-27 ENCOUNTER — Ambulatory Visit: Payer: PPO | Admitting: Rheumatology

## 2018-01-27 VITALS — BP 173/82 | HR 59 | Resp 14 | Ht 62.0 in | Wt 174.0 lb

## 2018-01-27 DIAGNOSIS — M170 Bilateral primary osteoarthritis of knee: Secondary | ICD-10-CM | POA: Diagnosis not present

## 2018-01-27 DIAGNOSIS — M0579 Rheumatoid arthritis with rheumatoid factor of multiple sites without organ or systems involvement: Secondary | ICD-10-CM

## 2018-01-27 DIAGNOSIS — M24521 Contracture, right elbow: Secondary | ICD-10-CM | POA: Diagnosis not present

## 2018-01-27 DIAGNOSIS — N29 Other disorders of kidney and ureter in diseases classified elsewhere: Secondary | ICD-10-CM | POA: Diagnosis not present

## 2018-01-27 DIAGNOSIS — Z8639 Personal history of other endocrine, nutritional and metabolic disease: Secondary | ICD-10-CM

## 2018-01-27 DIAGNOSIS — Z79899 Other long term (current) drug therapy: Secondary | ICD-10-CM

## 2018-01-27 DIAGNOSIS — Z8679 Personal history of other diseases of the circulatory system: Secondary | ICD-10-CM

## 2018-01-27 DIAGNOSIS — Z96641 Presence of right artificial hip joint: Secondary | ICD-10-CM

## 2018-01-27 NOTE — Patient Instructions (Addendum)
Standing Labs We placed an order today for your standing lab work.    Please come back and get your standing labs in September and every 3 months TB Gold is due in December We have open lab Monday through Friday from 8:30-11:30 AM and 1:30-4:00 PM  at the office of Dr. Bo Merino.   You may experience shorter wait times on Monday and Friday afternoons. The office is located at [address] [place], [place], [place] [address] No appointment is necessary.   Labs are drawn by [facility].  You may receive a bill from [facility] for your lab work. If you have any questions regarding directions or hours of operation,  please call [phone].

## 2018-01-28 DIAGNOSIS — J019 Acute sinusitis, unspecified: Secondary | ICD-10-CM | POA: Diagnosis not present

## 2018-02-01 DIAGNOSIS — N289 Disorder of kidney and ureter, unspecified: Secondary | ICD-10-CM | POA: Diagnosis not present

## 2018-02-12 DIAGNOSIS — Z6831 Body mass index (BMI) 31.0-31.9, adult: Secondary | ICD-10-CM | POA: Diagnosis not present

## 2018-02-12 DIAGNOSIS — K5732 Diverticulitis of large intestine without perforation or abscess without bleeding: Secondary | ICD-10-CM | POA: Diagnosis not present

## 2018-02-12 DIAGNOSIS — N179 Acute kidney failure, unspecified: Secondary | ICD-10-CM | POA: Diagnosis not present

## 2018-02-12 DIAGNOSIS — R112 Nausea with vomiting, unspecified: Secondary | ICD-10-CM | POA: Diagnosis not present

## 2018-02-15 NOTE — Telephone Encounter (Signed)
Submitted renewal application for Humira from [employer]. Will follow up to confirm receipt.   Will send documents to scan center.  Fax# [phone] Phone# [phone]  11:30 AM [name], CPhT

## 2018-02-18 NOTE — Telephone Encounter (Signed)
Called [employer], and confirmed by rep Ivy, full application was received including patient income forms. Processing takes 7-10 business days. Called patient to advise, no answer or vm available.  1:32 PM [name], CPhT

## 2018-02-18 NOTE — Telephone Encounter (Signed)
Patient called in to say she received a call from [facility] stating they only received the provider renewal sheet. I called [employer] to ask for alternate fax number, because the main fax number is not going through. Spoke to rep, Lynnae Sandhoff, who provided the supervisor's fax number- [phone]. Refaxed documents. Will call back later today to confirm receipt.  Phone# [phone]  9:23 AM [name], CPhT

## 2018-02-19 DIAGNOSIS — N289 Disorder of kidney and ureter, unspecified: Secondary | ICD-10-CM | POA: Diagnosis not present

## 2018-03-03 NOTE — Telephone Encounter (Signed)
Received fax that patient did not meet eligibility requirements for [employer]. Called Dina Rich was advised that their review shows that patient should be able to pay for medication after coverage gap. But patient may submit a prescription out of pocket expense report to re-determine eligibility. Dina Rich will fax forms to patient to complete and return.   Called patient, no answer or voicemail, sounded like a fax line. Will follow up.  Dina Rich- [phone]  10:11 AM [name], CPhT

## 2018-03-09 DIAGNOSIS — N185 Chronic kidney disease, stage 5: Secondary | ICD-10-CM | POA: Diagnosis not present

## 2018-03-09 DIAGNOSIS — I120 Hypertensive chronic kidney disease with stage 5 chronic kidney disease or end stage renal disease: Secondary | ICD-10-CM | POA: Diagnosis not present

## 2018-03-16 NOTE — Telephone Encounter (Signed)
Called patient, no answer. Unable to leave a message. See notes below.

## 2018-03-17 ENCOUNTER — Other Ambulatory Visit (HOSPITAL_COMMUNITY): Payer: Self-pay | Admitting: Nephrology

## 2018-03-17 DIAGNOSIS — R809 Proteinuria, unspecified: Secondary | ICD-10-CM

## 2018-03-19 ENCOUNTER — Other Ambulatory Visit: Payer: Self-pay | Admitting: *Deleted

## 2018-03-19 DIAGNOSIS — Z79899 Other long term (current) drug therapy: Secondary | ICD-10-CM | POA: Diagnosis not present

## 2018-03-19 LAB — CBC WITH DIFFERENTIAL/PLATELET
BASOS PCT: 1.1 %
Basophils Absolute: 73 cells/uL (ref 0–200)
EOS PCT: 3.3 %
Eosinophils Absolute: 218 cells/uL (ref 15–500)
HCT: 31.8 % — ABNORMAL LOW (ref 35.0–45.0)
Hemoglobin: 10.8 g/dL — ABNORMAL LOW (ref 11.7–15.5)
Lymphs Abs: 3412 cells/uL (ref 850–3900)
MCH: 30.3 pg (ref 27.0–33.0)
MCHC: 34 g/dL (ref 32.0–36.0)
MCV: 89.3 fL (ref 80.0–100.0)
MONOS PCT: 12.7 %
MPV: 9.7 fL (ref 7.5–12.5)
NEUTROS ABS: 2059 {cells}/uL (ref 1500–7800)
Neutrophils Relative %: 31.2 %
PLATELETS: 207 10*3/uL (ref 140–400)
RBC: 3.56 10*6/uL — AB (ref 3.80–5.10)
RDW: 13.3 % (ref 11.0–15.0)
TOTAL LYMPHOCYTE: 51.7 %
WBC mixed population: 838 cells/uL (ref 200–950)
WBC: 6.6 10*3/uL (ref 3.8–10.8)

## 2018-03-19 LAB — COMPLETE METABOLIC PANEL WITH GFR
AG Ratio: 1.2 (calc) (ref 1.0–2.5)
ALT: 12 U/L (ref 6–29)
AST: 20 U/L (ref 10–35)
Albumin: 3.6 g/dL (ref 3.6–5.1)
Alkaline phosphatase (APISO): 69 U/L (ref 33–130)
BILIRUBIN TOTAL: 0.5 mg/dL (ref 0.2–1.2)
BUN/Creatinine Ratio: 15 (calc) (ref 6–22)
BUN: 37 mg/dL — AB (ref 7–25)
CALCIUM: 9.4 mg/dL (ref 8.6–10.4)
CHLORIDE: 105 mmol/L (ref 98–110)
CO2: 26 mmol/L (ref 20–32)
CREATININE: 2.46 mg/dL — AB (ref 0.50–0.99)
GFR, Est African American: 23 mL/min/{1.73_m2} — ABNORMAL LOW (ref >=60)
GFR, Est Non African American: 20 mL/min/{1.73_m2} — ABNORMAL LOW (ref >=60)
GLUCOSE: 97 mg/dL (ref 65–99)
Globulin: 3 g/dL (calc) (ref 1.9–3.7)
Potassium: 4.2 mmol/L (ref 3.5–5.3)
Sodium: 142 mmol/L (ref 135–146)
Total Protein: 6.6 g/dL (ref 6.1–8.1)

## 2018-03-22 NOTE — Progress Notes (Signed)
Patient's labs show anemia and drop in her GFR.  The only medication she is getting from [place] is Humira.  I tried calling patient couple of times but there was no response.  Please, try to contact her PCP and explained the situation.  Please try to call the patient again.

## 2018-03-22 NOTE — Telephone Encounter (Signed)
Patient brought in Out of pocket expense forms, faxed to [employer]. Will follow up later this week.   8:52 AM [name], CPhT

## 2018-03-23 ENCOUNTER — Other Ambulatory Visit: Payer: Self-pay | Admitting: Student

## 2018-03-23 ENCOUNTER — Telehealth: Payer: Self-pay | Admitting: Rheumatology

## 2018-03-23 ENCOUNTER — Other Ambulatory Visit: Payer: Self-pay | Admitting: Radiology

## 2018-03-23 NOTE — Telephone Encounter (Signed)
Left message to advise Caryl Pina at [facility] that we have not notified patient's nephrologist as we were not aware that patient has one. Advised that Caryl Pina that I was unable to reach patient with results yesterday and alerted there office to these results. Advised her that I spoke to patient today and she has seen [facility] and has a biopsy set up for tomorrow.

## 2018-03-23 NOTE — Telephone Encounter (Signed)
Caryl Pina from [facility] left a message stating their office received lab results for patient showing decreased Kidney function, and would like to know if our office contacted patient's Nephrologist in reference to this. Please call to advise.

## 2018-03-24 ENCOUNTER — Ambulatory Visit (HOSPITAL_COMMUNITY)
Admission: RE | Admit: 2018-03-24 | Discharge: 2018-03-24 | Disposition: A | Discharge status: Home / Self Care | Payer: PPO | Source: Ambulatory Visit | Attending: Nephrology | Admitting: Nephrology

## 2018-03-24 ENCOUNTER — Telehealth: Payer: Self-pay | Admitting: Pharmacy Technician

## 2018-03-24 ENCOUNTER — Encounter (HOSPITAL_COMMUNITY): Payer: Self-pay

## 2018-03-24 DIAGNOSIS — N058 Unspecified nephritic syndrome with other morphologic changes: Secondary | ICD-10-CM | POA: Insufficient documentation

## 2018-03-24 DIAGNOSIS — N051 Unspecified nephritic syndrome with focal and segmental glomerular lesions: Secondary | ICD-10-CM | POA: Diagnosis not present

## 2018-03-24 DIAGNOSIS — R809 Proteinuria, unspecified: Secondary | ICD-10-CM | POA: Diagnosis not present

## 2018-03-24 DIAGNOSIS — N189 Chronic kidney disease, unspecified: Secondary | ICD-10-CM | POA: Insufficient documentation

## 2018-03-24 HISTORY — PX: RENAL BIOPSY: SHX156

## 2018-03-24 LAB — CBC
HCT: 34 % — ABNORMAL LOW (ref 36.0–46.0)
Hemoglobin: 11.2 g/dL — ABNORMAL LOW (ref 12.0–15.0)
MCH: 30.9 pg (ref 26.0–34.0)
MCHC: 32.9 g/dL (ref 30.0–36.0)
MCV: 93.9 fL (ref 78.0–100.0)
Platelets: 263 10*3/uL (ref 150–400)
RBC: 3.62 MIL/uL — ABNORMAL LOW (ref 3.87–5.11)
RDW: 13.8 % (ref 11.5–15.5)
WBC: 6.7 10*3/uL (ref 4.0–10.5)

## 2018-03-24 LAB — PROTIME-INR
INR: 1
Prothrombin Time: 13.1 seconds (ref 11.4–15.2)

## 2018-03-24 MED ORDER — FENTANYL CITRATE (PF) 100 MCG/2ML IJ SOLN
INTRAMUSCULAR | Status: AC | PRN
  Administered 2018-03-24: 50 ug via INTRAVENOUS
  Administered 2018-03-24: 25 ug via INTRAVENOUS

## 2018-03-24 MED ORDER — SODIUM CHLORIDE 0.9 % IV SOLN
INTRAVENOUS | Status: AC | PRN
  Administered 2018-03-24: 10 mL/h via INTRAVENOUS

## 2018-03-24 MED ORDER — LIDOCAINE-EPINEPHRINE 1 %-1:100000 IJ SOLN
INTRAMUSCULAR | Status: AC
  Filled 2018-03-24: qty 1

## 2018-03-24 MED ORDER — FENTANYL CITRATE (PF) 100 MCG/2ML IJ SOLN
INTRAMUSCULAR | Status: AC
  Filled 2018-03-24: qty 2

## 2018-03-24 MED ORDER — MIDAZOLAM HCL 2 MG/2ML IJ SOLN
INTRAMUSCULAR | Status: AC
  Filled 2018-03-24: qty 2

## 2018-03-24 MED ORDER — SODIUM CHLORIDE 0.9 % IV SOLN: INTRAVENOUS | Status: DC

## 2018-03-24 MED ORDER — MIDAZOLAM HCL 2 MG/2ML IJ SOLN
INTRAMUSCULAR | Status: AC | PRN
  Administered 2018-03-24: 1 mg via INTRAVENOUS
  Administered 2018-03-24: 0.5 mg via INTRAVENOUS

## 2018-03-24 NOTE — Procedures (Signed)
Pre Procedure Dx: Proteinuria Post Procedural Dx: Same  Technically successful US guided biopsy of inferior pole of the left kidney.  EBL: Minimal Procedure complicated by development of a small asymptomatic left sided perinephric hematoma.  Ronny Bacon, MD Pager #: [phone]

## 2018-03-24 NOTE — Sedation Documentation (Signed)
Patient denies pain and is resting comfortably.  

## 2018-03-24 NOTE — Sedation Documentation (Addendum)
Patient is resting comfortably. 

## 2018-03-24 NOTE — Discharge Instructions (Addendum)

## 2018-03-24 NOTE — Telephone Encounter (Signed)
Prior Authorization for Humira has been submitted to patient's insurance via Cover My Meds. Will update once we receive a response.  12:08 PM Kaylee Rasmussen, CPhT

## 2018-03-24 NOTE — H&P (Signed)
Chief Complaint: Patient was seen in consultation today for renal biopsy at the request of Rosita Fire  Referring Physician(s): Rosita Fire  Supervising Physician: Sandi Mariscal  Patient Status: [facility] - Out-pt  History of Present Illness: Kaylee Rasmussen is a 67 y.o. female being worked up for proteinuria and CKD. She is referred for random renal biopsy. PMHx, meds, labs, imaging, allergies reviewed. Feels well, no recent fevers, chills, illness. Has been NPO today as directed. Family at bedside.   Past Medical History:  Diagnosis Date  . High cholesterol   . Hypertension   . Rheumatoid arthritis ([place])   . Vertigo     Past Surgical History:  Procedure Laterality Date  . ABDOMINAL HYSTERECTOMY    . HIP ARTHROPLASTY    . Lumbarectomy      Allergies: Scallops [shellfish allergy]  Medications: Prior to Admission medications   Medication Sig Start Date End Date Taking? Authorizing Provider  Adalimumab (HUMIRA PEN) 40 MG/0.8ML PNKT Inject 40 mg into the skin every 14 (fourteen) days. 12/21/17  Yes Deveshwar, Abel Presto, MD  amLODipine (NORVASC) 10 MG tablet Take 10 mg by mouth daily.  05/23/16  Yes [provider]  atorvastatin (LIPITOR) 10 MG tablet Take 10 mg by mouth daily.  05/23/16  Yes [provider]  Calcium Carbonate-Vitamin D (CALCIUM 600+D PO) Take 1 tablet by mouth 2 (two) times daily.   Yes [provider]  escitalopram (LEXAPRO) 10 MG tablet Take 10 mg by mouth daily. 03/23/17  Yes [provider]  levothyroxine (SYNTHROID, LEVOTHROID) 88 MCG tablet Take 88 mcg by mouth daily before breakfast.  03/12/17  Yes [provider]  metoprolol succinate (TOPROL-XL) 100 MG 24 hr tablet Take 100 mg by mouth daily. 03/24/17  Yes [provider]  PROAIR HFA 108 (90 Base) MCG/ACT inhaler Inhale 1-2 puffs into the lungs every 4 (four) hours as needed for wheezing.  05/05/16   [provider]     Family  History  Problem Relation Age of Onset  . Diabetes Mother   . Heart attack Mother   . Heart attack Father     Social History   Socioeconomic History  . Marital status: Married    Spouse name: Not on file  . Number of children: Not on file  . Years of education: Not on file  . Highest education level: Not on file  Occupational History  . Not on file  Social Needs  . Financial resource strain: Not on file  . Food insecurity:    Worry: Not on file    Inability: Not on file  . Transportation needs:    Medical: Not on file    Non-medical: Not on file  Tobacco Use  . Smoking status: Never Smoker  . Smokeless tobacco: Never Used  Substance and Sexual Activity  . Alcohol use: No  . Drug use: No  . Sexual activity: Not on file  Lifestyle  . Physical activity:    Days per week: Not on file    Minutes per session: Not on file  . Stress: Not on file  Relationships  . Social connections:    Talks on phone: Not on file    Gets together: Not on file    Attends religious service: Not on file    Active member of club or organization: Not on file    Attends meetings of clubs or organizations: Not on file    Relationship status: Not on file  Other Topics Concern  .  Not on file  Social History Narrative  . Not on file    Review of Systems: A 12 point ROS discussed and pertinent positives are indicated in the HPI above.  All other systems are negative.  Review of Systems  Vital Signs: BP (!) 157/75   Pulse 62   Temp 98.7 F (37.1 C) (Oral)   Resp 16   Ht 5\' 1"  (1.549 m)   Wt 74.8 kg   SpO2 96%   BMI 31.18 kg/m   Physical Exam  Constitutional: She is oriented to person, place, and time. She appears well-developed. No distress.  HENT:  Head: Normocephalic.  Mouth/Throat: Oropharynx is clear and moist.  Neck: Normal range of motion. No tracheal deviation present.  Cardiovascular: Normal rate, regular rhythm and normal heart sounds.  Pulmonary/Chest: Effort normal and  breath sounds normal. No respiratory distress.  Abdominal: Soft. She exhibits no distension. There is no tenderness.  Neurological: She is alert and oriented to person, place, and time.  Skin: Skin is warm and dry.  Psychiatric: She has a normal mood and affect.     Imaging: No results found.  Labs:  CBC: Recent Labs    06/04/17 1112 08/27/17 1159 12/16/17 1349 03/19/18 1400  WBC 6.2 6.8 8.0 6.6  HGB 14.4 13.6 12.9 10.8*  HCT 42.0 39.0 37.2 31.8*  PLT 286 231 240 207    COAGS: No results for input(s): INR, APTT in the last 8760 hours.  BMP: Recent Labs    06/04/17 1112 08/27/17 1159 12/16/17 1349 03/19/18 1400  NA 141 142 139 142  K 3.6 3.9 3.7 4.2  CL 103 103 101 105  CO2 27 32 30 26  GLUCOSE 116* 79 86 97  BUN 28* 19 28* 37*  CALCIUM 9.4 9.1 9.3 9.4  CREATININE 1.30* 1.16* 1.51* 2.46*  GFRNONAA 43* 49* 36* 20*  GFRAA 50* 57* 42* 23*    LIVER FUNCTION TESTS: Recent Labs    06/04/17 1112 08/27/17 1159 12/16/17 1349 03/19/18 1400  BILITOT 0.7 0.5 0.6 0.5  AST 25 26 26 20   ALT 21 18 14 12   PROT 6.8 6.6 7.0 6.6    TUMOR MARKERS: No results for input(s): AFPTM, CEA, CA199, CHROMGRNA in the last 8760 hours.  Assessment and Plan: Proteinuria For US guided random renal biopsy Labs pending Risks and benefits discussed with the patient including, but not limited to bleeding, infection, damage to adjacent structures or low yield requiring additional tests.  All of the patient's questions were answered, patient is agreeable to proceed. Consent signed and in chart.    Thank you for this interesting consult.  I greatly enjoyed meeting Kaylee Rasmussen and look forward to participating in their care.  A copy of this report was sent to the requesting provider on this date.  Electronically Signed: Ascencion Dike, PA-C 03/24/2018, 7:26 AM   I spent a total of 20 minutes in face to face in clinical consultation, greater than 50% of which was  counseling/coordinating care for renal biopsy

## 2018-03-24 NOTE — Sedation Documentation (Signed)
02 d/c 

## 2018-03-25 NOTE — Telephone Encounter (Signed)
Per plan, prior authorization was cancelled due to patient currently receiving from [facility]. PA can be resubmitted 30 days from 06/22/18. Rep, could not confirm copays but did say Humira is on Tier 5 and patient would have to pay 30% of cost.  Per [employer] patient will still receive medication from them until 05/25/18. Patient can call and discuss income and out of pocket to see if it can be re-evaluated.  Called patient, no answer, unable to leave message.  11:01 AM [name], CPhT

## 2018-04-01 ENCOUNTER — Encounter (HOSPITAL_COMMUNITY): Payer: Self-pay | Admitting: Nephrology

## 2018-04-02 ENCOUNTER — Telehealth: Payer: Self-pay | Admitting: Physician Assistant

## 2018-04-02 DIAGNOSIS — Z23 Encounter for immunization: Secondary | ICD-10-CM | POA: Diagnosis not present

## 2018-04-02 DIAGNOSIS — N184 Chronic kidney disease, stage 4 (severe): Secondary | ICD-10-CM | POA: Diagnosis not present

## 2018-04-02 DIAGNOSIS — M069 Rheumatoid arthritis, unspecified: Secondary | ICD-10-CM | POA: Diagnosis not present

## 2018-04-02 DIAGNOSIS — R809 Proteinuria, unspecified: Secondary | ICD-10-CM | POA: Diagnosis not present

## 2018-04-02 DIAGNOSIS — N058 Unspecified nephritic syndrome with other morphologic changes: Secondary | ICD-10-CM | POA: Diagnosis not present

## 2018-04-02 DIAGNOSIS — I129 Hypertensive chronic kidney disease with stage 1 through stage 4 chronic kidney disease, or unspecified chronic kidney disease: Secondary | ICD-10-CM | POA: Diagnosis not present

## 2018-04-02 NOTE — Telephone Encounter (Signed)
I received a phone call from Dr. Carolin Sicks at [facility] regarding Kaylee Rasmussen renal biopsy results.  He has personally spoken with the pathologist, and he is concerned about possible lupus nephritis vs. ANCA glomerulonephritis, but her serology has been negative in the past (ANA-,complements WNL).  He will be repeating lab work.  She is currently well controlled on Humira 40 mg sq injections every 3 weeks for rheumatoid arthritis.  He is considering starting her on Cellcept vs. Cytoxan, and he would like Dr. Arlean Hopping opinion.  I will notify her of his concerns when she is back in the office and have her return his call.

## 2018-04-05 ENCOUNTER — Other Ambulatory Visit: Payer: Self-pay | Admitting: *Deleted

## 2018-04-05 MED ORDER — ADALIMUMAB 40 MG/0.8ML ~~LOC~~ AJKT: 40.0000 mg | AUTO-INJECTOR | SUBCUTANEOUS | 0 refills | Status: DC

## 2018-04-05 NOTE — Telephone Encounter (Signed)
Last Visit: 01/27/18 Next visit: 06/29/18 Labs: 03/19/18 Patient's labs show anemia and drop in her GFR.(PCP aware and patient has seen nephrology) TB gold: 06/04/17 Neg   Okay to refill per Hazel Sams, PA-C

## 2018-04-13 ENCOUNTER — Telehealth: Payer: Self-pay | Admitting: Rheumatology

## 2018-04-13 NOTE — Telephone Encounter (Signed)
I returned phone call and  left a message for Dr. Carolin Sicks yesterday. He returned my phone call today.  He stated that patient's renal biopsy is consistent with vasculitis.  Her labs were positive for ANCA.  He plans to start patient on Cytoxan and IV steroids.  I discussed with him that patients rheumatoid arthritis is very well controlled on low-dose Humira.  It should be okay to discontinue Humira when she starts treatment for vasculitis. Bo Merino, MD

## 2018-04-23 DIAGNOSIS — N184 Chronic kidney disease, stage 4 (severe): Secondary | ICD-10-CM | POA: Diagnosis not present

## 2018-04-23 DIAGNOSIS — N058 Unspecified nephritic syndrome with other morphologic changes: Secondary | ICD-10-CM | POA: Diagnosis not present

## 2018-04-23 DIAGNOSIS — N189 Chronic kidney disease, unspecified: Secondary | ICD-10-CM | POA: Diagnosis not present

## 2018-04-23 DIAGNOSIS — R809 Proteinuria, unspecified: Secondary | ICD-10-CM | POA: Diagnosis not present

## 2018-05-10 DIAGNOSIS — D631 Anemia in chronic kidney disease: Secondary | ICD-10-CM | POA: Diagnosis not present

## 2018-05-10 DIAGNOSIS — N058 Unspecified nephritic syndrome with other morphologic changes: Secondary | ICD-10-CM | POA: Diagnosis not present

## 2018-05-10 DIAGNOSIS — N184 Chronic kidney disease, stage 4 (severe): Secondary | ICD-10-CM | POA: Diagnosis not present

## 2018-05-10 DIAGNOSIS — I129 Hypertensive chronic kidney disease with stage 1 through stage 4 chronic kidney disease, or unspecified chronic kidney disease: Secondary | ICD-10-CM | POA: Diagnosis not present

## 2018-05-24 DIAGNOSIS — N184 Chronic kidney disease, stage 4 (severe): Secondary | ICD-10-CM | POA: Diagnosis not present

## 2018-06-07 DIAGNOSIS — I129 Hypertensive chronic kidney disease with stage 1 through stage 4 chronic kidney disease, or unspecified chronic kidney disease: Secondary | ICD-10-CM | POA: Diagnosis not present

## 2018-06-07 DIAGNOSIS — L659 Nonscarring hair loss, unspecified: Secondary | ICD-10-CM | POA: Diagnosis not present

## 2018-06-07 DIAGNOSIS — N184 Chronic kidney disease, stage 4 (severe): Secondary | ICD-10-CM | POA: Diagnosis not present

## 2018-06-07 DIAGNOSIS — D631 Anemia in chronic kidney disease: Secondary | ICD-10-CM | POA: Diagnosis not present

## 2018-06-07 DIAGNOSIS — N058 Unspecified nephritic syndrome with other morphologic changes: Secondary | ICD-10-CM | POA: Diagnosis not present

## 2018-06-10 DIAGNOSIS — Z136 Encounter for screening for cardiovascular disorders: Secondary | ICD-10-CM | POA: Diagnosis not present

## 2018-06-10 DIAGNOSIS — Z0000 Encounter for general adult medical examination without abnormal findings: Secondary | ICD-10-CM | POA: Diagnosis not present

## 2018-06-10 DIAGNOSIS — Z1239 Encounter for other screening for malignant neoplasm of breast: Secondary | ICD-10-CM | POA: Diagnosis not present

## 2018-06-10 DIAGNOSIS — Z6831 Body mass index (BMI) 31.0-31.9, adult: Secondary | ICD-10-CM | POA: Diagnosis not present

## 2018-06-10 DIAGNOSIS — E669 Obesity, unspecified: Secondary | ICD-10-CM | POA: Diagnosis not present

## 2018-06-10 DIAGNOSIS — E785 Hyperlipidemia, unspecified: Secondary | ICD-10-CM | POA: Diagnosis not present

## 2018-06-10 DIAGNOSIS — Z1331 Encounter for screening for depression: Secondary | ICD-10-CM | POA: Diagnosis not present

## 2018-06-11 DIAGNOSIS — R6889 Other general symptoms and signs: Secondary | ICD-10-CM | POA: Diagnosis not present

## 2018-06-11 DIAGNOSIS — R55 Syncope and collapse: Secondary | ICD-10-CM | POA: Diagnosis not present

## 2018-06-18 NOTE — Progress Notes (Deleted)
Office Visit Note  Patient: Kaylee Rasmussen             Date of Birth: Nov 20, 1950           MRN: [id number]             PCP: Nicoletta Dress, MD Referring: Nicoletta Dress, MD Visit Date: 06/29/2018 Occupation: @GUAROCC @  Subjective:  No chief complaint on file.  Humira.  Last TB gold negative on 06/04/2017.  Order for TB gold today.  Most recent CBC/CMP showed anemia and decline in kidney function on 03/19/2018.  Due for CBC/CMP today and will monitor every 3 months.  Standing orders are in place. Recommend annual influenza, Pneumovax 23, Prevnar 13, and Shingrix as indicated.  Recommend yearly skin exam.  History of Present Illness: Kaylee Rasmussen is a 67 y.o. female ***   Activities of Daily Living:  Patient reports morning stiffness for *** {minute/hour:19697}.   Patient {ACTIONS;DENIES/REPORTS:21021675::"Denies"} nocturnal pain.  Difficulty dressing/grooming: {ACTIONS;DENIES/REPORTS:21021675::"Denies"} Difficulty climbing stairs: {ACTIONS;DENIES/REPORTS:21021675::"Denies"} Difficulty getting out of chair: {ACTIONS;DENIES/REPORTS:21021675::"Denies"} Difficulty using hands for taps, buttons, cutlery, and/or writing: {ACTIONS;DENIES/REPORTS:21021675::"Denies"}  No Rheumatology ROS completed.   PMFS History:  Patient Active Problem List   Diagnosis Date Noted  . Rheumatoid arthritis involving multiple sites with positive rheumatoid factor ([place]) 08/27/2017  . High risk medication use 08/27/2017  . Primary osteoarthritis of both knees 08/27/2017  . History of total hip replacement, right 08/27/2017  . Renal calcinosis 08/27/2017  . History of hypertension 08/27/2017  . History of hypothyroidism 08/27/2017  . History of hypercholesterolemia 08/27/2017    Past Medical History:  Diagnosis Date  . High cholesterol   . Hypertension   . Rheumatoid arthritis ([place])   . Vertigo     Family History  Problem Relation Age of Onset  . Diabetes Mother   . Heart attack Mother     . Heart attack Father    Past Surgical History:  Procedure Laterality Date  . ABDOMINAL HYSTERECTOMY    . HIP ARTHROPLASTY    . Lumbarectomy     Social History   Social History Narrative  . Not on file    Objective: Vital Signs: There were no vitals taken for this visit.   Physical Exam   Musculoskeletal Exam: ***  CDAI Exam: CDAI Score: Not documented Patient Global Assessment: Not documented; Provider Global Assessment: Not documented Swollen: Not documented; Tender: Not documented Joint Exam   Not documented   There is currently no information documented on the homunculus. Go to the Rheumatology activity and complete the homunculus joint exam.  Investigation: No additional findings.  Imaging: No results found.  Recent Labs: Lab Results  Component Value Date   WBC 6.7 03/24/2018   HGB 11.2 (L) 03/24/2018   PLT 263 03/24/2018   NA 142 03/19/2018   K 4.2 03/19/2018   CL 105 03/19/2018   CO2 26 03/19/2018   GLUCOSE 97 03/19/2018   BUN 37 (H) 03/19/2018   CREATININE 2.46 (H) 03/19/2018   BILITOT 0.5 03/19/2018   ALKPHOS 75 11/24/2016   AST 20 03/19/2018   ALT 12 03/19/2018   PROT 6.6 03/19/2018   ALBUMIN 3.5 (L) 11/24/2016   CALCIUM 9.4 03/19/2018   GFRAA 23 (L) 03/19/2018   QFTBGOLD NEGATIVE 06/04/2017    Speciality Comments: No specialty comments available.  Procedures:  No procedures performed Allergies: Scallops [shellfish allergy]   Assessment / Plan:     Visit Diagnoses: No diagnosis found.   Orders: No orders of  the defined types were placed in this encounter.  No orders of the defined types were placed in this encounter.   Face-to-face time spent with patient was *** minutes. Greater than 50% of time was spent in counseling and coordination of care.  Follow-Up Instructions: No follow-ups on file.   Ofilia Neas, PA-C  Note - This record has been created using Dragon software.  Chart creation errors have been sought, but may  not always  have been located. Such creation errors do not reflect on  the standard of medical care.

## 2018-06-19 DIAGNOSIS — J208 Acute bronchitis due to other specified organisms: Secondary | ICD-10-CM | POA: Diagnosis not present

## 2018-06-22 NOTE — Telephone Encounter (Signed)
Received a fax from [employer] regarding a prior authorization for Humira. Authorization has been APPROVED from 06/21/18 to 06/21/2019.   Will send document to scan center.  Phone # [phone]  4:10 PM [name], CPhT

## 2018-06-24 DIAGNOSIS — J208 Acute bronchitis due to other specified organisms: Secondary | ICD-10-CM | POA: Diagnosis not present

## 2018-06-24 DIAGNOSIS — J209 Acute bronchitis, unspecified: Secondary | ICD-10-CM | POA: Diagnosis not present

## 2018-06-24 NOTE — Telephone Encounter (Signed)
Having trouble reaching patient. Patient was found ineligible for 2020 coverage through [facility]. Patient needs to call [employer] to voice that she can not afford $1600 copay for 1 month supply. Every attempt to call patient, there is no answer and no voicemail.  2:13 PM [name], CPhT

## 2018-06-27 DIAGNOSIS — E039 Hypothyroidism, unspecified: Secondary | ICD-10-CM | POA: Diagnosis not present

## 2018-06-27 DIAGNOSIS — E8809 Other disorders of plasma-protein metabolism, not elsewhere classified: Secondary | ICD-10-CM | POA: Diagnosis not present

## 2018-06-27 DIAGNOSIS — I10 Essential (primary) hypertension: Secondary | ICD-10-CM | POA: Diagnosis not present

## 2018-06-27 DIAGNOSIS — R0602 Shortness of breath: Secondary | ICD-10-CM | POA: Diagnosis not present

## 2018-06-27 DIAGNOSIS — R197 Diarrhea, unspecified: Secondary | ICD-10-CM | POA: Diagnosis not present

## 2018-06-27 DIAGNOSIS — R531 Weakness: Secondary | ICD-10-CM | POA: Diagnosis not present

## 2018-06-27 DIAGNOSIS — E860 Dehydration: Secondary | ICD-10-CM | POA: Diagnosis not present

## 2018-06-27 DIAGNOSIS — D649 Anemia, unspecified: Secondary | ICD-10-CM | POA: Diagnosis not present

## 2018-06-27 DIAGNOSIS — Z79899 Other long term (current) drug therapy: Secondary | ICD-10-CM | POA: Diagnosis not present

## 2018-06-27 DIAGNOSIS — J209 Acute bronchitis, unspecified: Secondary | ICD-10-CM | POA: Diagnosis not present

## 2018-06-27 DIAGNOSIS — R05 Cough: Secondary | ICD-10-CM | POA: Diagnosis not present

## 2018-06-27 DIAGNOSIS — E871 Hypo-osmolality and hyponatremia: Secondary | ICD-10-CM | POA: Diagnosis not present

## 2018-06-27 DIAGNOSIS — D72819 Decreased white blood cell count, unspecified: Secondary | ICD-10-CM | POA: Diagnosis not present

## 2018-06-27 DIAGNOSIS — F329 Major depressive disorder, single episode, unspecified: Secondary | ICD-10-CM | POA: Diagnosis not present

## 2018-06-27 DIAGNOSIS — R7989 Other specified abnormal findings of blood chemistry: Secondary | ICD-10-CM | POA: Diagnosis not present

## 2018-06-27 DIAGNOSIS — R112 Nausea with vomiting, unspecified: Secondary | ICD-10-CM | POA: Diagnosis not present

## 2018-06-27 DIAGNOSIS — N179 Acute kidney failure, unspecified: Secondary | ICD-10-CM | POA: Diagnosis not present

## 2018-06-28 DIAGNOSIS — N179 Acute kidney failure, unspecified: Secondary | ICD-10-CM | POA: Diagnosis not present

## 2018-06-28 DIAGNOSIS — E860 Dehydration: Secondary | ICD-10-CM | POA: Diagnosis not present

## 2018-06-28 DIAGNOSIS — D72819 Decreased white blood cell count, unspecified: Secondary | ICD-10-CM | POA: Diagnosis not present

## 2018-06-28 DIAGNOSIS — I10 Essential (primary) hypertension: Secondary | ICD-10-CM | POA: Diagnosis not present

## 2018-06-28 DIAGNOSIS — R7989 Other specified abnormal findings of blood chemistry: Secondary | ICD-10-CM | POA: Diagnosis not present

## 2018-06-28 DIAGNOSIS — J209 Acute bronchitis, unspecified: Secondary | ICD-10-CM | POA: Diagnosis not present

## 2018-06-28 DIAGNOSIS — R197 Diarrhea, unspecified: Secondary | ICD-10-CM | POA: Diagnosis not present

## 2018-06-28 DIAGNOSIS — E871 Hypo-osmolality and hyponatremia: Secondary | ICD-10-CM | POA: Diagnosis not present

## 2018-06-28 DIAGNOSIS — R112 Nausea with vomiting, unspecified: Secondary | ICD-10-CM | POA: Diagnosis not present

## 2018-06-28 DIAGNOSIS — R531 Weakness: Secondary | ICD-10-CM | POA: Diagnosis not present

## 2018-06-29 ENCOUNTER — Ambulatory Visit: Payer: PPO | Admitting: Physician Assistant

## 2018-07-01 ENCOUNTER — Other Ambulatory Visit: Payer: Self-pay | Admitting: *Deleted

## 2018-07-01 NOTE — Patient Outreach (Signed)
[facility]) Care Management  07/01/2018  ALONNA BARTLING 13-Jun-1951 [id number]   Transition of Care Referral   Referral Date: 06/30/2018 Referral Source: [facility] IP discharge Date of Admission: unknown Diagnosis: unknowon Date of Discharge: on 06/28/18 Facility: [facility]: HTA  Transition of care services noted to be completed by primary care MD office staff- [facility] health internal medicine Dr Delena Bali patient   Referral received. No outreach warranted at this time. Transition of Care will be completed by primary care provider office who will refer to [facility] care management if needed.  Plan: [facility] RN CM will close case.   Charnita Trudel L. Lavina Hamman, RN, BSN, [place] Management Care Coordinator Direct Number [phone] Mobile number [phone]  Main [facility] number [phone] Fax number [phone]

## 2018-07-05 DIAGNOSIS — M069 Rheumatoid arthritis, unspecified: Secondary | ICD-10-CM | POA: Diagnosis not present

## 2018-07-05 DIAGNOSIS — K922 Gastrointestinal hemorrhage, unspecified: Secondary | ICD-10-CM | POA: Diagnosis not present

## 2018-07-05 DIAGNOSIS — N185 Chronic kidney disease, stage 5: Secondary | ICD-10-CM | POA: Diagnosis not present

## 2018-07-05 DIAGNOSIS — I10 Essential (primary) hypertension: Secondary | ICD-10-CM | POA: Diagnosis not present

## 2018-07-05 DIAGNOSIS — A419 Sepsis, unspecified organism: Secondary | ICD-10-CM | POA: Diagnosis not present

## 2018-07-07 DIAGNOSIS — M069 Rheumatoid arthritis, unspecified: Secondary | ICD-10-CM | POA: Diagnosis not present

## 2018-07-07 DIAGNOSIS — A419 Sepsis, unspecified organism: Secondary | ICD-10-CM | POA: Diagnosis not present

## 2018-07-07 DIAGNOSIS — K5180 Other ulcerative colitis without complications: Secondary | ICD-10-CM | POA: Diagnosis not present

## 2018-07-07 DIAGNOSIS — B0089 Other herpesviral infection: Secondary | ICD-10-CM | POA: Diagnosis not present

## 2018-07-07 DIAGNOSIS — B258 Other cytomegaloviral diseases: Secondary | ICD-10-CM | POA: Diagnosis not present

## 2018-07-07 DIAGNOSIS — E039 Hypothyroidism, unspecified: Secondary | ICD-10-CM | POA: Diagnosis not present

## 2018-07-07 DIAGNOSIS — Z452 Encounter for adjustment and management of vascular access device: Secondary | ICD-10-CM | POA: Diagnosis not present

## 2018-07-07 DIAGNOSIS — D61811 Other drug-induced pancytopenia: Secondary | ICD-10-CM | POA: Diagnosis not present

## 2018-07-07 DIAGNOSIS — K208 Other esophagitis: Secondary | ICD-10-CM | POA: Diagnosis not present

## 2018-07-07 DIAGNOSIS — J449 Chronic obstructive pulmonary disease, unspecified: Secondary | ICD-10-CM | POA: Diagnosis not present

## 2018-07-07 DIAGNOSIS — N058 Unspecified nephritic syndrome with other morphologic changes: Secondary | ICD-10-CM | POA: Diagnosis not present

## 2018-07-07 DIAGNOSIS — R05 Cough: Secondary | ICD-10-CM | POA: Diagnosis not present

## 2018-07-07 DIAGNOSIS — I517 Cardiomegaly: Secondary | ICD-10-CM | POA: Diagnosis not present

## 2018-07-07 DIAGNOSIS — B001 Herpesviral vesicular dermatitis: Secondary | ICD-10-CM | POA: Diagnosis not present

## 2018-07-07 DIAGNOSIS — K2970 Gastritis, unspecified, without bleeding: Secondary | ICD-10-CM | POA: Diagnosis not present

## 2018-07-07 DIAGNOSIS — K648 Other hemorrhoids: Secondary | ICD-10-CM | POA: Diagnosis not present

## 2018-07-07 DIAGNOSIS — R0602 Shortness of breath: Secondary | ICD-10-CM | POA: Diagnosis not present

## 2018-07-07 DIAGNOSIS — K209 Esophagitis, unspecified: Secondary | ICD-10-CM | POA: Diagnosis not present

## 2018-07-07 DIAGNOSIS — I10 Essential (primary) hypertension: Secondary | ICD-10-CM | POA: Diagnosis not present

## 2018-07-07 DIAGNOSIS — A0839 Other viral enteritis: Secondary | ICD-10-CM | POA: Diagnosis not present

## 2018-07-07 DIAGNOSIS — D61818 Other pancytopenia: Secondary | ICD-10-CM | POA: Diagnosis not present

## 2018-07-07 DIAGNOSIS — K121 Other forms of stomatitis: Secondary | ICD-10-CM | POA: Diagnosis not present

## 2018-07-07 DIAGNOSIS — E876 Hypokalemia: Secondary | ICD-10-CM | POA: Diagnosis not present

## 2018-07-07 DIAGNOSIS — D62 Acute posthemorrhagic anemia: Secondary | ICD-10-CM | POA: Diagnosis not present

## 2018-07-07 DIAGNOSIS — N189 Chronic kidney disease, unspecified: Secondary | ICD-10-CM | POA: Diagnosis not present

## 2018-07-07 DIAGNOSIS — K210 Gastro-esophageal reflux disease with esophagitis: Secondary | ICD-10-CM | POA: Diagnosis not present

## 2018-07-07 DIAGNOSIS — K922 Gastrointestinal hemorrhage, unspecified: Secondary | ICD-10-CM | POA: Diagnosis not present

## 2018-07-07 DIAGNOSIS — D638 Anemia in other chronic diseases classified elsewhere: Secondary | ICD-10-CM | POA: Diagnosis not present

## 2018-07-07 DIAGNOSIS — B974 Respiratory syncytial virus as the cause of diseases classified elsewhere: Secondary | ICD-10-CM | POA: Diagnosis not present

## 2018-07-07 DIAGNOSIS — D709 Neutropenia, unspecified: Secondary | ICD-10-CM | POA: Diagnosis not present

## 2018-07-07 DIAGNOSIS — R531 Weakness: Secondary | ICD-10-CM | POA: Diagnosis not present

## 2018-07-07 DIAGNOSIS — J069 Acute upper respiratory infection, unspecified: Secondary | ICD-10-CM | POA: Diagnosis not present

## 2018-07-07 DIAGNOSIS — J439 Emphysema, unspecified: Secondary | ICD-10-CM | POA: Diagnosis not present

## 2018-07-07 DIAGNOSIS — J90 Pleural effusion, not elsewhere classified: Secondary | ICD-10-CM | POA: Diagnosis not present

## 2018-07-07 DIAGNOSIS — E785 Hyperlipidemia, unspecified: Secondary | ICD-10-CM | POA: Diagnosis not present

## 2018-07-07 DIAGNOSIS — K644 Residual hemorrhoidal skin tags: Secondary | ICD-10-CM | POA: Diagnosis not present

## 2018-07-07 DIAGNOSIS — R197 Diarrhea, unspecified: Secondary | ICD-10-CM | POA: Diagnosis not present

## 2018-07-07 DIAGNOSIS — Z743 Need for continuous supervision: Secondary | ICD-10-CM | POA: Diagnosis not present

## 2018-07-07 DIAGNOSIS — Z8739 Personal history of other diseases of the musculoskeletal system and connective tissue: Secondary | ICD-10-CM | POA: Diagnosis not present

## 2018-07-07 DIAGNOSIS — K921 Melena: Secondary | ICD-10-CM | POA: Diagnosis not present

## 2018-07-07 DIAGNOSIS — B370 Candidal stomatitis: Secondary | ICD-10-CM | POA: Diagnosis not present

## 2018-07-07 DIAGNOSIS — D490 Neoplasm of unspecified behavior of digestive system: Secondary | ICD-10-CM | POA: Diagnosis not present

## 2018-07-07 DIAGNOSIS — R112 Nausea with vomiting, unspecified: Secondary | ICD-10-CM | POA: Diagnosis not present

## 2018-07-07 DIAGNOSIS — D649 Anemia, unspecified: Secondary | ICD-10-CM | POA: Diagnosis not present

## 2018-07-07 DIAGNOSIS — B259 Cytomegaloviral disease, unspecified: Secondary | ICD-10-CM | POA: Diagnosis not present

## 2018-07-08 DIAGNOSIS — K208 Other esophagitis without bleeding: Secondary | ICD-10-CM

## 2018-07-08 DIAGNOSIS — B0089 Other herpesviral infection: Secondary | ICD-10-CM

## 2018-07-08 DIAGNOSIS — B259 Cytomegaloviral disease, unspecified: Secondary | ICD-10-CM

## 2018-07-08 HISTORY — DX: Cytomegaloviral disease, unspecified: B25.9

## 2018-07-08 HISTORY — DX: Other herpesviral infection: B00.89

## 2018-07-08 HISTORY — DX: Other esophagitis without bleeding: K20.80

## 2018-07-13 DIAGNOSIS — J449 Chronic obstructive pulmonary disease, unspecified: Secondary | ICD-10-CM

## 2018-07-13 HISTORY — DX: Chronic obstructive pulmonary disease, unspecified: J44.9

## 2018-08-02 DIAGNOSIS — J205 Acute bronchitis due to respiratory syncytial virus: Secondary | ICD-10-CM | POA: Diagnosis not present

## 2018-08-02 DIAGNOSIS — B0089 Other herpesviral infection: Secondary | ICD-10-CM | POA: Diagnosis not present

## 2018-08-02 DIAGNOSIS — A0839 Other viral enteritis: Secondary | ICD-10-CM | POA: Diagnosis not present

## 2018-08-02 DIAGNOSIS — D61818 Other pancytopenia: Secondary | ICD-10-CM | POA: Diagnosis not present

## 2018-08-02 DIAGNOSIS — N058 Unspecified nephritic syndrome with other morphologic changes: Secondary | ICD-10-CM | POA: Diagnosis not present

## 2018-08-03 DIAGNOSIS — A499 Bacterial infection, unspecified: Secondary | ICD-10-CM | POA: Diagnosis not present

## 2018-08-03 DIAGNOSIS — A0839 Other viral enteritis: Secondary | ICD-10-CM | POA: Diagnosis not present

## 2018-08-04 DIAGNOSIS — N183 Chronic kidney disease, stage 3 (moderate): Secondary | ICD-10-CM | POA: Diagnosis not present

## 2018-08-04 DIAGNOSIS — D61818 Other pancytopenia: Secondary | ICD-10-CM | POA: Diagnosis not present

## 2018-08-04 DIAGNOSIS — D631 Anemia in chronic kidney disease: Secondary | ICD-10-CM | POA: Diagnosis not present

## 2018-08-04 DIAGNOSIS — N058 Unspecified nephritic syndrome with other morphologic changes: Secondary | ICD-10-CM | POA: Diagnosis not present

## 2018-08-04 DIAGNOSIS — R809 Proteinuria, unspecified: Secondary | ICD-10-CM | POA: Diagnosis not present

## 2018-08-04 DIAGNOSIS — I129 Hypertensive chronic kidney disease with stage 1 through stage 4 chronic kidney disease, or unspecified chronic kidney disease: Secondary | ICD-10-CM | POA: Diagnosis not present

## 2018-08-05 DIAGNOSIS — D61818 Other pancytopenia: Secondary | ICD-10-CM | POA: Diagnosis not present

## 2018-08-05 DIAGNOSIS — R945 Abnormal results of liver function studies: Secondary | ICD-10-CM | POA: Diagnosis not present

## 2018-08-05 DIAGNOSIS — Z79899 Other long term (current) drug therapy: Secondary | ICD-10-CM | POA: Diagnosis not present

## 2018-08-05 DIAGNOSIS — M069 Rheumatoid arthritis, unspecified: Secondary | ICD-10-CM | POA: Diagnosis not present

## 2018-08-05 DIAGNOSIS — B001 Herpesviral vesicular dermatitis: Secondary | ICD-10-CM | POA: Diagnosis not present

## 2018-08-05 DIAGNOSIS — B259 Cytomegaloviral disease, unspecified: Secondary | ICD-10-CM | POA: Diagnosis not present

## 2018-08-05 DIAGNOSIS — N289 Disorder of kidney and ureter, unspecified: Secondary | ICD-10-CM | POA: Diagnosis not present

## 2018-08-10 DIAGNOSIS — R945 Abnormal results of liver function studies: Secondary | ICD-10-CM | POA: Diagnosis not present

## 2018-08-10 DIAGNOSIS — B259 Cytomegaloviral disease, unspecified: Secondary | ICD-10-CM | POA: Diagnosis not present

## 2018-08-24 DIAGNOSIS — B0089 Other herpesviral infection: Secondary | ICD-10-CM | POA: Diagnosis not present

## 2018-08-24 DIAGNOSIS — B258 Other cytomegaloviral diseases: Secondary | ICD-10-CM | POA: Diagnosis not present

## 2018-08-24 DIAGNOSIS — N289 Disorder of kidney and ureter, unspecified: Secondary | ICD-10-CM | POA: Diagnosis not present

## 2018-08-24 DIAGNOSIS — D72819 Decreased white blood cell count, unspecified: Secondary | ICD-10-CM | POA: Diagnosis not present

## 2018-08-24 DIAGNOSIS — B259 Cytomegaloviral disease, unspecified: Secondary | ICD-10-CM | POA: Diagnosis not present

## 2018-08-24 DIAGNOSIS — R945 Abnormal results of liver function studies: Secondary | ICD-10-CM | POA: Diagnosis not present

## 2018-08-24 DIAGNOSIS — M069 Rheumatoid arthritis, unspecified: Secondary | ICD-10-CM | POA: Diagnosis not present

## 2018-08-24 DIAGNOSIS — B001 Herpesviral vesicular dermatitis: Secondary | ICD-10-CM | POA: Diagnosis not present

## 2018-08-25 DIAGNOSIS — N058 Unspecified nephritic syndrome with other morphologic changes: Secondary | ICD-10-CM | POA: Diagnosis not present

## 2018-08-30 DIAGNOSIS — N183 Chronic kidney disease, stage 3 (moderate): Secondary | ICD-10-CM | POA: Diagnosis not present

## 2018-08-30 DIAGNOSIS — Z1331 Encounter for screening for depression: Secondary | ICD-10-CM | POA: Diagnosis not present

## 2018-08-30 DIAGNOSIS — D61818 Other pancytopenia: Secondary | ICD-10-CM | POA: Diagnosis not present

## 2018-08-30 DIAGNOSIS — Z1231 Encounter for screening mammogram for malignant neoplasm of breast: Secondary | ICD-10-CM | POA: Diagnosis not present

## 2018-08-30 DIAGNOSIS — Z9181 History of falling: Secondary | ICD-10-CM | POA: Diagnosis not present

## 2018-08-30 DIAGNOSIS — I129 Hypertensive chronic kidney disease with stage 1 through stage 4 chronic kidney disease, or unspecified chronic kidney disease: Secondary | ICD-10-CM | POA: Diagnosis not present

## 2018-08-31 DIAGNOSIS — N183 Chronic kidney disease, stage 3 (moderate): Secondary | ICD-10-CM | POA: Diagnosis not present

## 2018-08-31 DIAGNOSIS — D61818 Other pancytopenia: Secondary | ICD-10-CM | POA: Diagnosis not present

## 2018-08-31 DIAGNOSIS — I129 Hypertensive chronic kidney disease with stage 1 through stage 4 chronic kidney disease, or unspecified chronic kidney disease: Secondary | ICD-10-CM | POA: Diagnosis not present

## 2018-08-31 DIAGNOSIS — D631 Anemia in chronic kidney disease: Secondary | ICD-10-CM | POA: Diagnosis not present

## 2018-08-31 DIAGNOSIS — R809 Proteinuria, unspecified: Secondary | ICD-10-CM | POA: Diagnosis not present

## 2018-08-31 DIAGNOSIS — N058 Unspecified nephritic syndrome with other morphologic changes: Secondary | ICD-10-CM | POA: Diagnosis not present

## 2018-09-13 DIAGNOSIS — I129 Hypertensive chronic kidney disease with stage 1 through stage 4 chronic kidney disease, or unspecified chronic kidney disease: Secondary | ICD-10-CM | POA: Diagnosis not present

## 2018-09-17 DIAGNOSIS — D61818 Other pancytopenia: Secondary | ICD-10-CM | POA: Diagnosis not present

## 2018-09-17 DIAGNOSIS — D649 Anemia, unspecified: Secondary | ICD-10-CM | POA: Diagnosis not present

## 2018-09-17 DIAGNOSIS — E538 Deficiency of other specified B group vitamins: Secondary | ICD-10-CM | POA: Diagnosis not present

## 2018-09-23 ENCOUNTER — Other Ambulatory Visit: Payer: Self-pay

## 2018-09-23 ENCOUNTER — Telehealth (INDEPENDENT_AMBULATORY_CARE_PROVIDER_SITE_OTHER): Payer: PPO | Admitting: Gastroenterology

## 2018-09-23 DIAGNOSIS — D61818 Other pancytopenia: Secondary | ICD-10-CM

## 2018-09-23 DIAGNOSIS — R197 Diarrhea, unspecified: Secondary | ICD-10-CM | POA: Diagnosis not present

## 2018-09-23 MED ORDER — CHOLESTYRAMINE 4 G PO PACK: PACK | ORAL | 2 refills | Status: DC

## 2018-09-23 NOTE — Patient Instructions (Addendum)
-Gluten-free diet.   -Cholestyramine 4 g p.o. once a day.  2 hours before or after rest of the medications. #30, 2 refills.   -Stool studies for GI Pathogen (includes C. Diff), WBCs, culture, fecal elastase. Please go to [facility] to have this done.   -Patient is to call us in 2 weeks to let us know how she is doing.  Gluten-Free Diet for Celiac Disease, Adult  The gluten-free diet includes all foods that do not contain gluten. Gluten is a protein that is found in wheat, rye, barley, and some other grains. Following the gluten-free diet is the only treatment for people with celiac disease. It helps to prevent damage to the intestines and improves or eliminates the symptoms of celiac disease. Following the gluten-free diet requires some planning. It can be challenging at first, but it gets easier with time and practice. There are more gluten-free options available today than ever before. If you need help finding gluten-free foods or if you have questions, talk with your diet and nutrition specialist (registered dietitian) or your health care provider. What do I need to know about a gluten-free diet?  All fruits, vegetables, and meats are safe to eat and do not contain gluten.  When grocery shopping, start by shopping in the produce, meat, and dairy sections. These sections are more likely to contain gluten-free foods. Then move to the aisles that contain packaged foods if you need to.  Read all food labels. Gluten is often added to foods. Always check the ingredient list and look for warnings, such as "may contain gluten."  Talk with your dietitian or health care provider before taking a gluten-free multivitamin or mineral supplement.  Be aware of gluten-free foods having contact with foods that contain gluten (cross-contamination). This can happen at home and with any processed foods. ? Talk with your health care provider or dietitian about how to reduce the risk of  cross-contamination in your home. ? If you have questions about how a food is processed, ask the manufacturer. What key words help to identify gluten? Foods that list any of these key words on the label usually contain gluten:  Wheat, flour, enriched flour, bromated flour, white flour, durum flour, graham flour, phosphated flour, self-rising flour, semolina, farina, barley (malt), rye, and oats.  Starch, dextrin, modified food starch, or cereal.  Thickening, fillers, or emulsifiers.  Malt flavoring, malt extract, or malt syrup.  Hydrolyzed vegetable protein. In the [place] packaged foods that are gluten-free are required to be labeled "GF." These foods should be easy to identify and are safe to eat. In the [place] food companies are also required to list common food allergens, including wheat, on their labels. Recommended foods Grains  Amaranth, bean flours, 100% buckwheat flour, corn, millet, nut flours or nut meals, GF oats, quinoa, rice, sorghum, teff, rice wafers, pure cornmeal tortillas, popcorn, and hot cereals made from cornmeal. Hominy, rice, wild rice. Some Asian rice noodles or bean noodles. Arrowroot starch, corn bran, corn flour, corn germ, cornmeal, corn starch, potato flour, potato starch flour, and rice bran. Plain, brown, and sweet rice flours. Rice polish, soy flour, and tapioca starch. Vegetables  All plain fresh, frozen, and canned vegetables. Fruits  All plain fresh, frozen, canned, and dried fruits, and 100% fruit juices. Meats and other protein foods  All fresh beef, pork, poultry, fish, seafood, and eggs. Fish canned in water, oil, brine, or vegetable broth. Plain nuts and seeds, peanut butter. Some lunch meat and some frankfurters.  Dried beans, dried peas, and lentils. Dairy  Fresh plain, dry, evaporated, or condensed milk. Cream, butter, sour cream, whipping cream, and most yogurts. Unprocessed cheese, most processed cheeses, some cottage cheese, some cream  cheeses. Beverages  Coffee, tea, most herbal teas. Carbonated beverages and some root beers. Wine, sake, and distilled spirits, such as gin, vodka, and whiskey. Most hard ciders. Fats and oils  Butter, margarine, vegetable oil, hydrogenated butter, olive oil, shortening, lard, cream, and some mayonnaise. Some commercial salad dressings. Olives. Sweets and desserts  Sugar, honey, some syrups, molasses, jelly, and jam. Plain hard candy, marshmallows, and gumdrops. Pure cocoa powder. Plain chocolate. Custard and some pudding mixes. Gelatin desserts, sorbets, frozen ice pops, and sherbet. Cake, cookies, and other desserts prepared with allowed flours. Some commercial ice creams. Cornstarch, tapioca, and rice puddings. Seasoning and other foods  Some canned or frozen soups. Monosodium glutamate (MSG). Cider, rice, and wine vinegar. Baking soda and baking powder. Cream of tartar. Baking and nutritional yeast. Certain soy sauces made without wheat (ask your dietitian about specific brands that are allowed). Nuts, coconut, and chocolate. Salt, pepper, herbs, spices, flavoring extracts, imitation or artificial flavorings, natural flavorings, and food colorings. Some medicines and supplements. Some lip glosses and other cosmetics. Rice syrups. The items listed may not be a complete list. Talk with your dietitian about what dietary choices are best for you. Foods to avoid Grains  Barley, bran, bulgur, couscous, cracked wheat, [place], farro, graham, malt, matzo, semolina, wheat germ, and all wheat and rye cereals including spelt and kamut. Cereals containing malt as a flavoring, such as rice cereal. Noodles, spaghetti, macaroni, most packaged rice mixes, and all mixes containing wheat, rye, barley, or triticale. Vegetables  Most creamed vegetables and most vegetables canned in sauces. Some commercially prepared vegetables and salads. Fruits  Thickened or prepared fruits and some pie fillings. Some fruit  snacks and fruit roll-ups. Meats and other protein foods  Any meat or meat alternative containing wheat, rye, barley, or gluten stabilizers. These are often marinated or packaged meats and lunch meats. Bread-containing products, such as Swiss steak, croquettes, meatballs, and meatloaf. Most tuna canned in vegetable broth and [place] with hydrolyzed vegetable protein (HVP) injected as part of the basting. Seitan. Imitation fish. Eggs in sauces made from ingredients to avoid. Dairy  Commercial chocolate milk drinks and malted milk. Some non-dairy creamers. Any cheese product containing ingredients to avoid. Beverages  Certain cereal beverages. Beer, ale, malted milk, and some root beers. Some hard ciders. Some instant flavored coffees. Some herbal teas made with barley or with barley malt added. Fats and oils  Some commercial salad dressings. Sour cream containing modified food starch. Sweets and desserts  Some toffees. Chocolate-coated nuts (may be rolled in wheat flour) and some commercial candies and candy bars. Most cakes, cookies, donuts, pastries, and other baked goods. Some commercial ice cream. Ice cream cones. Commercially prepared mixes for cakes, cookies, and other desserts. Bread pudding and other puddings thickened with flour. Products containing brown rice syrup made with barley malt enzyme. Desserts and sweets made with malt flavoring. Seasoning and other foods  Some curry powders, some dry seasoning mixes, some gravy extracts, some meat sauces, some ketchups, some prepared mustards, and horseradish. Certain soy sauces. Malt vinegar. Bouillon and bouillon cubes that contain HVP. Some chip dips, and some chewing gum. Yeast extract. Brewer's yeast. Caramel color. Some medicines and supplements. Some lip glosses and other cosmetics. The items listed may not be a complete list. Talk with  your dietitian about what dietary choices are best for you. Summary  Gluten is a protein that is  found in wheat, rye, barley, and some other grains. The gluten-free diet includes all foods that do not contain gluten.  If you need help finding gluten-free foods or if you have questions, talk with your diet and nutrition specialist (registered dietitian) or your health care provider.  Read all food labels. Gluten is often added to foods. Always check the ingredient list and look for warnings, such as "may contain gluten." This information is not intended to replace advice given to you by your health care provider. Make sure you discuss any questions you have with your health care provider. Document Released: 06/09/2005 Document Revised: 03/24/2016 Document Reviewed: 03/24/2016 Elsevier Interactive Patient Education  2019 [employer].

## 2018-09-23 NOTE — Progress Notes (Signed)
Chief Complaint: Diarrhea  Referring Provider:  Nicoletta Dress, MD      ASSESSMENT AND PLAN;   #1.  Diarrhea  #2. H/O pancytopenia d/t cyclophosphamide (for necrotizing glomerulonephritis)/Humira (for RA) complicated by GI bleed s/p EGD/colon at [place] showing CMV colitis, HSV esophagitis.  Treated with Val ganciclovir/atovaquone 07/2018.  #3. Abn LFTs  Plan: -Gluten-free diet x 2 weeks. -Cholestyramine 4 g p.o. once a day.  2 hours before or after rest of the medications. #30, 2 refills. -Records from [facility] (not in care everywhere), Dr. Delena Bali and Dr. Bobby Rumpf. Any CTs. -Stool studies for GI Pathogen (includes C. Diff), WBCs, culture, fecal elastase. -Patient is to call us in 2 weeks to let us know how she is doing. -If still with problems and the stool studies are negative, will give her a trial of Lomotil. -Proceed with blood work (CBC, CMP) scheduled for 09/29/2018 with Dr. Delena Bali -FU in 4 weeks.   -If LFTs are still significantly elevated, proceed with US abdomen.  I believe she had CT abdo/pelvis during recent hospitalization.  We will follow-up on that.  Hold Lipitor for now.  If she continues to have abn LFTs despite above, would consider liver Bx.  -She needs to continue to follow with nephrology and ID.  HPI:    Kaylee Rasmussen is a 68 y.o. female  With very complicated history  Only limited notes are available.  No notes are available from [facility] (even in care everywhere)  Had pancytopenia d/t cyclophosphamide (for necrotizing glomerulonephritis)/Humira (for RA) complicated by GI bleed s/p EGD/colon at [place] showing CMV colitis, HSV esophagitis.  Treated with Val ganciclovir/atovaquone 07/2018.  Most recent labs on 09/17/2018 showed WBC count of 7.7, hemoglobin 9.8, MCV 108, platelets 190 3K.  BUN/creatinine 16/1.2.  LFTs AST/ALT 115/46 with alk phos 775.  Her main complaint is that of diarrhea since mid February.  3-4 times per day  mostly watery with nocturnal symptoms.  She denies having any significant nausea, vomiting, heartburn, regurgitation, odynophagia or dysphagia.  No melena or hematochezia.   The diarrhea makes her feel very weak and tired.  Has more diarrhea with pasta, breads lately.  No jaundice dark urine or pale stools.  No itching.   Past GI procedures: -EGD/colonoscopy at [facility] 07/2018 (records not available yet) -Colonoscopy 08/12/2017-small colonic polyp, moderate colonic diverticulosis Past Medical History:  Diagnosis Date  . High cholesterol   . Hypertension   . Rheumatoid arthritis ([place])   . Vertigo     Past Surgical History:  Procedure Laterality Date  . ABDOMINAL HYSTERECTOMY    . HIP ARTHROPLASTY    . Lumbarectomy      Family History  Problem Relation Age of Onset  . Diabetes Mother   . Heart attack Mother   . Heart attack Father     Social History   Tobacco Use  . Smoking status: Never Smoker  . Smokeless tobacco: Never Used  Substance Use Topics  . Alcohol use: No  . Drug use: No    Current Outpatient Medications  Medication Sig Dispense Refill  . amLODipine (NORVASC) 10 MG tablet Take 10 mg by mouth daily.   12  . atorvastatin (LIPITOR) 10 MG tablet Take 10 mg by mouth daily.   6  . Calcium Carbonate-Vitamin D (CALCIUM 600+D PO) Take 1 tablet by mouth 2 (two) times daily.    [name] Kitchen escitalopram (LEXAPRO) 10 MG tablet Take 10 mg by mouth daily.    [name] Kitchen  levothyroxine (SYNTHROID, LEVOTHROID) 88 MCG tablet Take 88 mcg by mouth daily before breakfast.   5  . metoprolol succinate (TOPROL-XL) 100 MG 24 hr tablet Take 100 mg by mouth daily.    [name] Kitchen PROAIR HFA 108 (90 Base) MCG/ACT inhaler Inhale 1-2 puffs into the lungs every 4 (four) hours as needed for wheezing.   12  . SODIUM BICARBONATE PO Take by mouth. 10 mg twice  daily     No current facility-administered medications for this visit.     Allergies  Allergen Reactions  . Scallops [Shellfish Allergy] Rash     Review of Systems:  Constitutional: Denies fever, chills, diaphoresis, appetite change and has fatigue.  HEENT: Denies photophobia, eye pain, redness, hearing loss, ear pain, congestion, sore throat, rhinorrhea, sneezing, mouth sores, neck pain, neck stiffness and tinnitus.   Respiratory: Denies SOB, DOE, cough, chest tightness,  and wheezing.   Cardiovascular: Denies chest pain, palpitations and leg swelling.  Genitourinary: Denies dysuria, urgency, frequency, hematuria, flank pain and difficulty urinating.  Musculoskeletal: has myalgias, back pain, joint swelling, arthralgias and gait problem.  Skin: No rash.  Neurological: Denies dizziness, seizures, syncope, weakness, light-headedness, numbness and headaches.  Hematological: Denies adenopathy. Easy bruising, personal or family bleeding history  Psychiatric/Behavioral: Has anxiety or depression     Physical Exam:    Not examined since was a tele visit  Data Reviewed: I have personally reviewed following labs and imaging studies  CBC: CBC Latest Ref Rng & Units 03/24/2018 03/19/2018 12/16/2017  WBC 4.0 - 10.5 K/uL 6.7 6.6 8.0  Hemoglobin 12.0 - 15.0 g/dL 11.2(L) 10.8(L) 12.9  Hematocrit 36.0 - 46.0 % 34.0(L) 31.8(L) 37.2  Platelets 150 - 400 K/uL 263 207 240    CMP: CMP Latest Ref Rng & Units 03/19/2018 12/16/2017 08/27/2017  Glucose 65 - 99 mg/dL 97 86 79  BUN 7 - 25 mg/dL 37(H) 28(H) 19  Creatinine 0.50 - 0.99 mg/dL 2.46(H) 1.51(H) 1.16(H)  Sodium 135 - 146 mmol/L 142 139 142  Potassium 3.5 - 5.3 mmol/L 4.2 3.7 3.9  Chloride 98 - 110 mmol/L 105 101 103  CO2 20 - 32 mmol/L 26 30 32  Calcium 8.6 - 10.4 mg/dL 9.4 9.3 9.1  Total Protein 6.1 - 8.1 g/dL 6.6 7.0 6.6  Total Bilirubin 0.2 - 1.2 mg/dL 0.5 0.6 0.5  Alkaline Phos 33 - 130 U/L - - -  AST 10 - 35 U/L '20 26 26  '$ ALT 6 - 29 U/L '12 14 18   '$ This service was provided via telemedicine.  The patient was located at home.  The provider was located in office.  The patient did  consent to this Zoom visit and is aware of possible charges through their insurance for this visit.   Time spent on call, reviewing records, coordinating care: 45 min     Carmell Austria, MD 09/23/2018, 10:16 AM  Cc: Nicoletta Dress, MD,. Dr Bobby Rumpf

## 2018-09-24 DIAGNOSIS — D61818 Other pancytopenia: Secondary | ICD-10-CM | POA: Diagnosis not present

## 2018-09-24 DIAGNOSIS — E039 Hypothyroidism, unspecified: Secondary | ICD-10-CM | POA: Diagnosis not present

## 2018-09-24 DIAGNOSIS — N189 Chronic kidney disease, unspecified: Secondary | ICD-10-CM | POA: Diagnosis not present

## 2018-09-24 DIAGNOSIS — Z90710 Acquired absence of both cervix and uterus: Secondary | ICD-10-CM | POA: Diagnosis not present

## 2018-09-24 DIAGNOSIS — M069 Rheumatoid arthritis, unspecified: Secondary | ICD-10-CM | POA: Diagnosis not present

## 2018-09-24 DIAGNOSIS — Z9181 History of falling: Secondary | ICD-10-CM | POA: Diagnosis not present

## 2018-09-24 DIAGNOSIS — K5289 Other specified noninfective gastroenteritis and colitis: Secondary | ICD-10-CM | POA: Diagnosis not present

## 2018-09-24 DIAGNOSIS — F329 Major depressive disorder, single episode, unspecified: Secondary | ICD-10-CM | POA: Diagnosis not present

## 2018-09-24 DIAGNOSIS — B259 Cytomegaloviral disease, unspecified: Secondary | ICD-10-CM | POA: Diagnosis not present

## 2018-09-24 DIAGNOSIS — N058 Unspecified nephritic syndrome with other morphologic changes: Secondary | ICD-10-CM | POA: Diagnosis not present

## 2018-09-24 DIAGNOSIS — E7800 Pure hypercholesterolemia, unspecified: Secondary | ICD-10-CM | POA: Diagnosis not present

## 2018-09-24 DIAGNOSIS — I129 Hypertensive chronic kidney disease with stage 1 through stage 4 chronic kidney disease, or unspecified chronic kidney disease: Secondary | ICD-10-CM | POA: Diagnosis not present

## 2018-09-27 ENCOUNTER — Other Ambulatory Visit: Payer: Self-pay

## 2018-09-27 ENCOUNTER — Other Ambulatory Visit (INDEPENDENT_AMBULATORY_CARE_PROVIDER_SITE_OTHER): Payer: PPO

## 2018-09-27 DIAGNOSIS — Z7901 Long term (current) use of anticoagulants: Secondary | ICD-10-CM | POA: Diagnosis not present

## 2018-09-27 DIAGNOSIS — E561 Deficiency of vitamin K: Secondary | ICD-10-CM | POA: Diagnosis not present

## 2018-09-27 DIAGNOSIS — I10 Essential (primary) hypertension: Secondary | ICD-10-CM | POA: Diagnosis not present

## 2018-09-27 DIAGNOSIS — E559 Vitamin D deficiency, unspecified: Secondary | ICD-10-CM | POA: Diagnosis not present

## 2018-09-27 DIAGNOSIS — R197 Diarrhea, unspecified: Secondary | ICD-10-CM

## 2018-09-27 DIAGNOSIS — I421 Obstructive hypertrophic cardiomyopathy: Secondary | ICD-10-CM | POA: Diagnosis not present

## 2018-09-27 DIAGNOSIS — E039 Hypothyroidism, unspecified: Secondary | ICD-10-CM | POA: Diagnosis not present

## 2018-09-27 DIAGNOSIS — Z79899 Other long term (current) drug therapy: Secondary | ICD-10-CM | POA: Diagnosis not present

## 2018-09-27 DIAGNOSIS — I480 Paroxysmal atrial fibrillation: Secondary | ICD-10-CM | POA: Diagnosis not present

## 2018-09-27 DIAGNOSIS — Z01818 Encounter for other preprocedural examination: Secondary | ICD-10-CM | POA: Diagnosis not present

## 2018-09-27 DIAGNOSIS — Z9889 Other specified postprocedural states: Secondary | ICD-10-CM | POA: Diagnosis not present

## 2018-09-27 DIAGNOSIS — E119 Type 2 diabetes mellitus without complications: Secondary | ICD-10-CM | POA: Diagnosis not present

## 2018-09-28 DIAGNOSIS — Z299 Encounter for prophylactic measures, unspecified: Secondary | ICD-10-CM | POA: Diagnosis not present

## 2018-09-28 DIAGNOSIS — Q059 Spina bifida, unspecified: Secondary | ICD-10-CM | POA: Diagnosis not present

## 2018-09-28 DIAGNOSIS — Z006 Encounter for examination for normal comparison and control in clinical research program: Secondary | ICD-10-CM | POA: Diagnosis not present

## 2018-09-28 DIAGNOSIS — D61818 Other pancytopenia: Secondary | ICD-10-CM | POA: Diagnosis not present

## 2018-09-28 DIAGNOSIS — I716 Thoracoabdominal aortic aneurysm, without rupture: Secondary | ICD-10-CM | POA: Diagnosis not present

## 2018-09-28 DIAGNOSIS — I10 Essential (primary) hypertension: Secondary | ICD-10-CM | POA: Diagnosis not present

## 2018-09-28 DIAGNOSIS — G629 Polyneuropathy, unspecified: Secondary | ICD-10-CM | POA: Diagnosis not present

## 2018-09-28 DIAGNOSIS — Z6822 Body mass index (BMI) 22.0-22.9, adult: Secondary | ICD-10-CM | POA: Diagnosis not present

## 2018-09-28 LAB — FECAL LACTOFERRIN, QUANT
Fecal Lactoferrin: POSITIVE — AB
MICRO NUMBER:: 377477
SPECIMEN QUALITY:: ADEQUATE

## 2018-09-30 ENCOUNTER — Telehealth: Payer: Self-pay | Admitting: Gastroenterology

## 2018-09-30 DIAGNOSIS — J189 Pneumonia, unspecified organism: Secondary | ICD-10-CM | POA: Diagnosis not present

## 2018-09-30 DIAGNOSIS — J3089 Other allergic rhinitis: Secondary | ICD-10-CM | POA: Diagnosis not present

## 2018-09-30 DIAGNOSIS — M8588 Other specified disorders of bone density and structure, other site: Secondary | ICD-10-CM | POA: Diagnosis not present

## 2018-09-30 DIAGNOSIS — J301 Allergic rhinitis due to pollen: Secondary | ICD-10-CM | POA: Diagnosis not present

## 2018-09-30 DIAGNOSIS — Z780 Asymptomatic menopausal state: Secondary | ICD-10-CM | POA: Diagnosis not present

## 2018-09-30 DIAGNOSIS — M810 Age-related osteoporosis without current pathological fracture: Secondary | ICD-10-CM | POA: Diagnosis not present

## 2018-09-30 DIAGNOSIS — D649 Anemia, unspecified: Secondary | ICD-10-CM | POA: Diagnosis not present

## 2018-09-30 NOTE — Telephone Encounter (Signed)
Pt returned your call and would like a call back again.

## 2018-09-30 NOTE — Telephone Encounter (Signed)
The pt states that her diarrhea has improved- she did have labs and they should be faxed to our office when resulted.  She will call if she does not continue to improve.

## 2018-09-30 NOTE — Telephone Encounter (Signed)
Thanks for letting me know!

## 2018-09-30 NOTE — Telephone Encounter (Signed)
Notes recorded by Jackquline Denmark, MD on 09/30/2018 at 11:44 AM EDT Please inform the patient. I have reviewed extensive notes from [facility] including colonoscopy report. She was very sick when she was admitted. Doing much better Stool studies were negative for C. Difficile and other pathogens. This is really good news. Positive for WBCs means that she still has some inflammation (expected) Please call her and see how she is doing from the diarrhea standpoint. Please let me know. She should have had blood tests yesterday at Dr. Denton Lank office. If we can follow-up on the blood tests next week. Send report to family physician

## 2018-10-01 LAB — STOOL CULTURE
MICRO NUMBER:: 377376
MICRO NUMBER:: 377377
MICRO NUMBER:: 377378
SHIGA RESULT:: NOT DETECTED
SPECIMEN QUALITY:: ADEQUATE
SPECIMEN QUALITY:: ADEQUATE
SPECIMEN QUALITY:: ADEQUATE

## 2018-10-05 LAB — GASTROINTESTINAL PATHOGEN PANEL PCR
C. difficile Tox A/B, PCR: NOT DETECTED
Campylobacter, PCR: NOT DETECTED
Cryptosporidium, PCR: NOT DETECTED
E coli (ETEC) LT/ST PCR: NOT DETECTED
E coli (STEC) stx1/stx2, PCR: NOT DETECTED
E coli 0157, PCR: NOT DETECTED
Giardia lamblia, PCR: NOT DETECTED
Norovirus, PCR: NOT DETECTED
Rotavirus A, PCR: NOT DETECTED
Salmonella, PCR: NOT DETECTED
Shigella, PCR: NOT DETECTED

## 2018-10-05 LAB — PANCREATIC ELASTASE, FECAL: Pancreatic Elastase-1, Stool: 216 mcg/g

## 2018-10-06 ENCOUNTER — Telehealth: Payer: Self-pay

## 2018-10-06 ENCOUNTER — Other Ambulatory Visit: Payer: Self-pay | Admitting: *Deleted

## 2018-10-06 MED ORDER — CHOLESTYRAMINE 4 G PO PACK: 4.0000 g | PACK | Freq: Two times a day (BID) | ORAL | 2 refills | Status: DC

## 2018-10-06 NOTE — Telephone Encounter (Signed)
Per Dr. Lyndel Safe faxed GI Pathogen Panel and CBC to Dr. Nelda Bucks at [phone].

## 2018-10-07 ENCOUNTER — Telehealth: Payer: PPO | Admitting: Gastroenterology

## 2018-10-08 DIAGNOSIS — N390 Urinary tract infection, site not specified: Secondary | ICD-10-CM | POA: Diagnosis not present

## 2018-10-08 DIAGNOSIS — D61818 Other pancytopenia: Secondary | ICD-10-CM | POA: Diagnosis not present

## 2018-10-08 DIAGNOSIS — R790 Abnormal level of blood mineral: Secondary | ICD-10-CM | POA: Diagnosis not present

## 2018-10-11 DIAGNOSIS — I129 Hypertensive chronic kidney disease with stage 1 through stage 4 chronic kidney disease, or unspecified chronic kidney disease: Secondary | ICD-10-CM | POA: Diagnosis not present

## 2018-10-11 DIAGNOSIS — N183 Chronic kidney disease, stage 3 (moderate): Secondary | ICD-10-CM | POA: Diagnosis not present

## 2018-10-11 DIAGNOSIS — E538 Deficiency of other specified B group vitamins: Secondary | ICD-10-CM | POA: Diagnosis not present

## 2018-10-11 DIAGNOSIS — R634 Abnormal weight loss: Secondary | ICD-10-CM | POA: Diagnosis not present

## 2018-10-11 DIAGNOSIS — D61818 Other pancytopenia: Secondary | ICD-10-CM | POA: Diagnosis not present

## 2018-10-13 DIAGNOSIS — D631 Anemia in chronic kidney disease: Secondary | ICD-10-CM | POA: Diagnosis not present

## 2018-10-13 DIAGNOSIS — D61818 Other pancytopenia: Secondary | ICD-10-CM | POA: Diagnosis not present

## 2018-10-13 DIAGNOSIS — N390 Urinary tract infection, site not specified: Secondary | ICD-10-CM | POA: Diagnosis not present

## 2018-10-13 DIAGNOSIS — N183 Chronic kidney disease, stage 3 (moderate): Secondary | ICD-10-CM | POA: Diagnosis not present

## 2018-10-13 DIAGNOSIS — I129 Hypertensive chronic kidney disease with stage 1 through stage 4 chronic kidney disease, or unspecified chronic kidney disease: Secondary | ICD-10-CM | POA: Diagnosis not present

## 2018-10-13 DIAGNOSIS — E876 Hypokalemia: Secondary | ICD-10-CM | POA: Diagnosis not present

## 2018-10-13 DIAGNOSIS — N058 Unspecified nephritic syndrome with other morphologic changes: Secondary | ICD-10-CM | POA: Diagnosis not present

## 2018-10-18 ENCOUNTER — Telehealth: Payer: Self-pay

## 2018-10-19 NOTE — Telephone Encounter (Signed)
Pt updated that she is still having vomiting and diarrhea.  She reported having "lost control of her BM this morning 30-60 mins after eating."  Please advise.

## 2018-10-19 NOTE — Telephone Encounter (Signed)
Lets start Zofran 4 mg ODT every 6 hours as needed, #30. Stop cholestyramine Let us know how she is in 24 hours. If she does not keep anything down and continues to have nausea/vomiting, may have to come to the ED for IVF

## 2018-10-20 MED ORDER — ONDANSETRON 4 MG PO TBDP: 4.0000 mg | ORAL_TABLET | Freq: Four times a day (QID) | ORAL | 0 refills | Status: DC | PRN

## 2018-10-20 NOTE — Telephone Encounter (Signed)
Patient informed that medication was sent to pharmacy. Patient advised that if she continued to have  N/V that she may need to go to the ER for IVF. Patient voiced understanding.

## 2018-10-21 NOTE — Telephone Encounter (Signed)
Patient's husband called in regards to the Zofran and had 3 doses yesterday and 1 today and 1 more does in about 30 minutes. BM seems like it is less than it is. Still experiencing weakness. Can't really walk as much. Wants to know what they can do for the patient

## 2018-10-24 DIAGNOSIS — K5289 Other specified noninfective gastroenteritis and colitis: Secondary | ICD-10-CM | POA: Diagnosis not present

## 2018-10-24 DIAGNOSIS — F329 Major depressive disorder, single episode, unspecified: Secondary | ICD-10-CM | POA: Diagnosis not present

## 2018-10-24 DIAGNOSIS — M069 Rheumatoid arthritis, unspecified: Secondary | ICD-10-CM | POA: Diagnosis not present

## 2018-10-24 DIAGNOSIS — N058 Unspecified nephritic syndrome with other morphologic changes: Secondary | ICD-10-CM | POA: Diagnosis not present

## 2018-10-24 DIAGNOSIS — D61818 Other pancytopenia: Secondary | ICD-10-CM | POA: Diagnosis not present

## 2018-10-24 DIAGNOSIS — N189 Chronic kidney disease, unspecified: Secondary | ICD-10-CM | POA: Diagnosis not present

## 2018-10-24 DIAGNOSIS — E7800 Pure hypercholesterolemia, unspecified: Secondary | ICD-10-CM | POA: Diagnosis not present

## 2018-10-24 DIAGNOSIS — Z9181 History of falling: Secondary | ICD-10-CM | POA: Diagnosis not present

## 2018-10-24 DIAGNOSIS — I129 Hypertensive chronic kidney disease with stage 1 through stage 4 chronic kidney disease, or unspecified chronic kidney disease: Secondary | ICD-10-CM | POA: Diagnosis not present

## 2018-10-24 DIAGNOSIS — E039 Hypothyroidism, unspecified: Secondary | ICD-10-CM | POA: Diagnosis not present

## 2018-10-24 DIAGNOSIS — B259 Cytomegaloviral disease, unspecified: Secondary | ICD-10-CM | POA: Diagnosis not present

## 2018-10-24 DIAGNOSIS — Z90710 Acquired absence of both cervix and uterus: Secondary | ICD-10-CM | POA: Diagnosis not present

## 2018-10-25 NOTE — Telephone Encounter (Signed)
Can you please call patient tomorrow and see how she is doing.  Please let me know

## 2018-10-28 ENCOUNTER — Other Ambulatory Visit: Payer: Self-pay

## 2018-10-28 MED ORDER — ONDANSETRON 4 MG PO TBDP: 4.0000 mg | ORAL_TABLET | Freq: Four times a day (QID) | ORAL | 1 refills | Status: DC | PRN

## 2018-10-28 NOTE — Telephone Encounter (Signed)
Called and told patient that Zofran ODT 4mg  was sent to her pharmacy and she said she will pick it up in the morning

## 2018-10-28 NOTE — Telephone Encounter (Signed)
Called patient and she said that she is doing better today. Since everything she has noticed a decrease in BM. Day before yesterday was 7 BM. Yesterday was 3. Today she hasn't had a BM yet. She would also like to know if she can have a refill with the Zofran. She has 4 tablets left, 2 for today and 2 for tomorrow.

## 2018-10-28 NOTE — Telephone Encounter (Signed)
-----   Message from Jackquline Denmark, MD sent at 10/25/2018  4:37 PM EDT -----   ----- Message ----- From: Villa Herb, CMA Sent: 10/21/2018   3:52 PM EDT To: Jackquline Denmark, MD

## 2018-10-28 NOTE — Telephone Encounter (Signed)
I am glad she is doing better Call in Zofran 4mg  ODT (#45) PRN for nausea, 1 refill

## 2018-10-29 DIAGNOSIS — D649 Anemia, unspecified: Secondary | ICD-10-CM | POA: Diagnosis not present

## 2018-11-04 DIAGNOSIS — N189 Chronic kidney disease, unspecified: Secondary | ICD-10-CM | POA: Diagnosis not present

## 2018-11-04 DIAGNOSIS — D61818 Other pancytopenia: Secondary | ICD-10-CM | POA: Diagnosis not present

## 2018-11-09 ENCOUNTER — Telehealth: Payer: Self-pay | Admitting: Gastroenterology

## 2018-11-09 NOTE — Telephone Encounter (Signed)
Pt reported that she has had uncontrollable diarrhea since December 2019.  She stated that she has seen Dr. Bobby Rumpf at [facility] two weeks ago--pt had lost 12 lbs within a month.  She is experiencing early satiety.    She stated that ondansetron has helped a bit with diarrhea.  Please advise.

## 2018-11-09 NOTE — Telephone Encounter (Signed)
The pt states she continues to have diarrhea about 4 times daily.  She also has early satiety.  She states she has taken zofran for nausea but that seems to make the nausea worse.  She took [place] for about 3 weeks and did not seem to get any relief and was advised to stop.  Please advise

## 2018-11-11 NOTE — Telephone Encounter (Signed)
Lets try Lomotil 1 tablet p.o. 3 times daily #90. Recheck stool studies for GI pathogens, WBC. Please make sure that she has follow-up appointment with ID Lets get list of all her medications

## 2018-11-12 NOTE — Telephone Encounter (Signed)
Left message on machine to call back  

## 2018-11-15 ENCOUNTER — Other Ambulatory Visit: Payer: Self-pay | Admitting: Gastroenterology

## 2018-11-16 ENCOUNTER — Telehealth: Payer: Self-pay | Admitting: Gastroenterology

## 2018-11-16 NOTE — Telephone Encounter (Signed)
Patient is put on for a follow up Zoom visit with Dr. Lyndel Safe for tomorrow at 3:00

## 2018-11-16 NOTE — Telephone Encounter (Signed)
Pt called and stated that she is needing to speak with a nurse she has been getting worse with vomiting, diarrhea, and weakness. She is needing a call asap with advice on what she can do.

## 2018-11-17 ENCOUNTER — Telehealth (INDEPENDENT_AMBULATORY_CARE_PROVIDER_SITE_OTHER): Payer: PPO | Admitting: Gastroenterology

## 2018-11-17 ENCOUNTER — Encounter: Payer: Self-pay | Admitting: Gastroenterology

## 2018-11-17 ENCOUNTER — Other Ambulatory Visit: Payer: Self-pay

## 2018-11-17 DIAGNOSIS — R112 Nausea with vomiting, unspecified: Secondary | ICD-10-CM

## 2018-11-17 DIAGNOSIS — R197 Diarrhea, unspecified: Secondary | ICD-10-CM

## 2018-11-17 MED ORDER — PROMETHAZINE HCL 25 MG PO TABS: 25.0000 mg | ORAL_TABLET | Freq: Three times a day (TID) | ORAL | 2 refills | Status: DC | PRN

## 2018-11-17 NOTE — Telephone Encounter (Signed)
Patient has follow up visit today with Dr. Lyndel Safe.  Symptoms have worsened

## 2018-11-17 NOTE — Patient Instructions (Addendum)
If you are age 67 or older, your body mass index should be between 23-30. Your Body mass index is 26.45 kg/m. If this is out of the aforementioned range listed, please consider follow up with your Primary Care Provider.  If you are age 11 or younger, your body mass index should be between 19-25. Your Body mass index is 26.45 kg/m. If this is out of the aformentioned range listed, please consider follow up with your Primary Care Provider.   We have sent the following medications to your pharmacy for you to pick up at your convenience: Phenergan 25mg  po q8hrs as needed.  - Can stop evening soda bicarb.  Will help with bloating.  She would also discuss that with Dr. Carolin Sicks at next visit in 2 weeks. - Take Potassium in AM  Please call Dr. Leland Her nurse Karl Pock, RN)  in 2 weeks at [phone]  to let her now how you are doing.    Thank you,  Dr. Jackquline Denmark

## 2018-11-17 NOTE — Progress Notes (Signed)
Chief Complaint: Diarrhea  Referring Provider:  Nicoletta Dress, MD      ASSESSMENT AND PLAN;   #1.  Diarrhea (resolved)  #2. H/O pancytopenia d/t cyclophosphamide (for necrotizing glomerulonephritis)/Humira (for RA) complicated by GI bleed s/p EGD/colon at [place] showing CMV colitis, HSV esophagitis.  Treated with Val ganciclovir/atovaquone 07/2018.  #3. Nausea/vomiting. Nl BUN/Cr  Plan: - Phenergan '25mg'$  po q8hrs prn (#20, 2 refills) fall precautions. - Can stop evening soda bicarb.  Will help with bloating.  She would also discuss that with Dr. Carolin Sicks at next visit in 2 weeks. - Take K supplements in AM - Patient is to call us in 2 weeks to let us know how she is doing.  If still with problems, will consider trial of PPIs.  Avoiding PPIs due to kidney problems for now. - FU tele-visit in 6 weeks.  HPI:    Kaylee Rasmussen is a 68 y.o. female  For follow-up visit. Has been having more nausea despite Zofran.  Believes that Zofran contributes to her nausea ironically.  She has stopped taking Zofran.  Nausea is mostly at night before she goes to bed.  She does take soda bicarb, potassium at night.  Diarrhea has more or less resolved.  She did not have any bowel movements in last 3 days. Had small bowel movement today.  Did not use Lomotil.  No abdominal pain.  Has been eating small but frequent meals.  Weight loss has stabilized.  Still complains of abdominal bloating which is better  Seen by Dr. Carolin Sicks (nephrology)-had normal BUN/creatinine.  He has decreased soda bicarb to 2/day.  With resultant improvement in diarrhea and some improvement in bloating.   With very complicated history Extensive notes reviewed.  Scanned in media tab  Had pancytopenia d/t cyclophosphamide (for necrotizing glomerulonephritis)/Humira (for RA) complicated by GI bleed s/p EGD/colon at [place] showing CMV colitis, HSV esophagitis.  Treated with Val ganciclovir/atovaquone 07/2018.  labs on 09/17/2018 showed WBC count of 7.7, hemoglobin 9.8, MCV 108, platelets 190 3K.  BUN/creatinine 16/1.2.  LFTs AST/ALT 115/46 with alk phos 775.   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 Bx-CMV colitis.  No malignancy.  Records scanned in "MEDIA TAB".  EGD showed LA grade D reflux esophagitis, gastritis. -Colonoscopy 08/12/2017-small colonic polyp, moderate colonic diverticulosis Past Medical History:  Diagnosis Date  . Asthma   . High cholesterol   . Hypertension   . Rheumatoid arthritis ([place])   . Vertigo     Past Surgical History:  Procedure Laterality Date  . ABDOMINAL HYSTERECTOMY    . HIP ARTHROPLASTY    . Lumbarectomy      Family History  Problem Relation Age of Onset  . Diabetes Mother   . Heart attack Mother   . Heart attack Father   . Breast cancer Maternal Aunt   . Colon cancer Neg Hx   . Esophageal cancer Neg Hx     Social History   Tobacco Use  . Smoking status: Never Smoker  . Smokeless tobacco: Never Used  Substance Use Topics  . Alcohol use: No  . Drug use: No    Current Outpatient Medications  Medication Sig Dispense Refill  . amLODipine (NORVASC) 10 MG tablet Take 10 mg by mouth daily.   12  . Calcium Carbonate-Vitamin D (CALCIUM 600+D PO) Take 1 tablet by mouth 2 (two) times daily.    [name] Kitchen escitalopram (LEXAPRO) 10 MG  tablet Take 10 mg by mouth daily.    [name] Kitchen levothyroxine (SYNTHROID, LEVOTHROID) 88 MCG tablet Take 88 mcg by mouth daily before breakfast.   5  . metoprolol succinate (TOPROL-XL) 100 MG 24 hr tablet Take 100 mg by mouth daily.    . potassium chloride (MICRO-K) 10 MEQ CR capsule Take 10 mEq by mouth daily.    [name] Kitchen PROAIR HFA 108 (90 Base) MCG/ACT inhaler Inhale 1-2 puffs into the lungs every 4 (four) hours as needed for wheezing.   12  . SODIUM BICARBONATE PO Take by mouth 2 (two) times daily. 10 gains twice  daily    . ondansetron (ZOFRAN ODT) 4  MG disintegrating tablet Take 1 tablet (4 mg total) by mouth every 6 (six) hours as needed for nausea or vomiting. (Patient not taking: Reported on 11/17/2018) 45 tablet 1   No current facility-administered medications for this visit.     Allergies  Allergen Reactions  . Cozaar [Losartan Potassium]   . Scallops [Shellfish Allergy] Rash    Review of Systems:       Physical Exam:    Not examined since was a tele visit   This service was provided via telemedicine.  The patient was located at home.  The provider was located in office.  The patient did consent to this Zoom visit and is aware of possible charges through their insurance for this visit.   Time spent on call, reviewing records, coordinating care: 25 min     Carmell Austria, MD 11/17/2018, 3:07 PM  Cc: Nicoletta Dress, MD,. Dr Bobby Rumpf

## 2018-11-18 DIAGNOSIS — I129 Hypertensive chronic kidney disease with stage 1 through stage 4 chronic kidney disease, or unspecified chronic kidney disease: Secondary | ICD-10-CM | POA: Diagnosis not present

## 2018-11-18 DIAGNOSIS — N183 Chronic kidney disease, stage 3 (moderate): Secondary | ICD-10-CM | POA: Diagnosis not present

## 2018-11-24 DIAGNOSIS — N183 Chronic kidney disease, stage 3 (moderate): Secondary | ICD-10-CM | POA: Diagnosis not present

## 2018-11-24 DIAGNOSIS — D631 Anemia in chronic kidney disease: Secondary | ICD-10-CM | POA: Diagnosis not present

## 2018-11-24 DIAGNOSIS — R809 Proteinuria, unspecified: Secondary | ICD-10-CM | POA: Diagnosis not present

## 2018-11-24 DIAGNOSIS — I129 Hypertensive chronic kidney disease with stage 1 through stage 4 chronic kidney disease, or unspecified chronic kidney disease: Secondary | ICD-10-CM | POA: Diagnosis not present

## 2018-11-24 DIAGNOSIS — N058 Unspecified nephritic syndrome with other morphologic changes: Secondary | ICD-10-CM | POA: Diagnosis not present

## 2018-11-30 ENCOUNTER — Telehealth: Payer: Self-pay | Admitting: Gastroenterology

## 2018-11-30 NOTE — Telephone Encounter (Signed)
Spoke to the patient who reports vomiting and diarrhea. The patient reports the vomiting has increased from approximately every 3 or 4 days to at least every other day. The patient also reports having a normal BM this am and about an hour later, 1 episode of watery diarrhea (extreme urgency, fecal incontinence) and abd pain that resolved approximately an hour after defecation. The patient stated that she has only been taking one 25 mg Phenergan daily because "it knocks me out." This RN explained that per Dr. Steve Rattler plan, she could take it up to every 8 hours. This was encouraged as her N/V has not improved and with the patient only taking the med x1 daily, it would be difficult to determine if the Phenergan actually helps not administering the medication as prescribed. The patient verbalized understanding. Although the patient stated this is a chronic condition, any recommendations for return of diarrhea? Please advise.

## 2018-11-30 NOTE — Telephone Encounter (Signed)
Patient husband called said that the patient is still having Bowel problems and vomiting. Also, he said that we should have received some labs about her liver being high.

## 2018-11-30 NOTE — Telephone Encounter (Signed)
For nausea-lets do what you recommended.  Take Phenergan 25 mg every 8 hours p.o.  If it makes her too sleepy, she can try cutting it in half and take 12.5 mg every 8 hours.  For diarrhea, lets use Imodium right ear on as-needed basis for now.  Has she stopped sodium bicarbonate as recommended by nephrology?

## 2018-11-30 NOTE — Telephone Encounter (Signed)
The patient stated she was originally taking 2 tabs sodium bicarb BID, then had blood work and was told to decrease the dose to 1 tab sodium bicarb BID. The patient said that she had a visit with Dr. Lyndel Safe who then told her to take 1 tab (the same day she had an appt with nephrology), go to the appt with nephrology and see what nephrology recommended. She states nephrology "wasn't happy" with the decrease in dosage but the patient states she has remained on 1 tab sodium bicarb every morning and has not discontinued the medication.    This RN also relayed all recommendations below as stated by Dr. Lyndel Safe. Patient verbalized understanding.

## 2018-12-01 NOTE — Telephone Encounter (Signed)
Thanks for letting me know  We will continue Imodium AD as needed for diarrhea  Kaylee Rasmussen, can you please check on her on Friday and see how she is doing.  Thanks for all your help.  RG

## 2018-12-06 NOTE — Telephone Encounter (Signed)
Patient returned your call and would like for you to call her back thanks.

## 2018-12-07 NOTE — Telephone Encounter (Signed)
Followed up with the patient as requested per Dr. Lyndel Safe, the patient reports she has slightly improved. She states that her abd pain is "not every second of the day" as it was previously. She does experience persistent nausea at bedtime despite taking 25 mg Phenergan q8hrs (the patient never decreased the dose to 12.5 mg q8hr as previously suggested). She has had one episode of diarrhea with urgency since last week with some fecal incontinence (the patient reports she has not taken Imodium as previously suggested). The patient said she felt there was no need to take the Imodium being that this was one incident of diarrhea. The patient also experiences early satiety that is often accompanied by abd pain and nausea. Nothing else was reported during the phone call.

## 2018-12-08 NOTE — Telephone Encounter (Signed)
Thanks for letting me know Looks like she is getting better slowly but steadily.  RG

## 2018-12-09 ENCOUNTER — Other Ambulatory Visit: Payer: Self-pay | Admitting: Gastroenterology

## 2018-12-13 NOTE — Telephone Encounter (Signed)
Patient states that she has been taking phenergan but is still having nausea and vomiting since we last spoke to her. Best # [phone]

## 2018-12-13 NOTE — Telephone Encounter (Signed)
The pt states she vomits everyday especially at bedtime.  Continues to have nausea with phenergan 25 mg twice daily.  She also complains of diarrhea every morning.  Please advise

## 2018-12-14 NOTE — Telephone Encounter (Signed)
Stop phenergan and start Compazine 10 mg p.o. twice daily on a regular basis x 1 week and then as needed (#45) Let us know how she does in a week. Did she have any blood tests done lately?.  If yes, please get the records. Neegs to FU in 6 weeks. May have to come for OV  RG

## 2018-12-15 MED ORDER — PROCHLORPERAZINE MALEATE 10 MG PO TABS: 10.0000 mg | ORAL_TABLET | Freq: Two times a day (BID) | ORAL | 0 refills | Status: DC

## 2018-12-15 NOTE — Telephone Encounter (Signed)
Prescription sent to the pharmacy and pt aware to have labs faxed to our office and f/u in 6 weeks.

## 2018-12-16 ENCOUNTER — Telehealth: Payer: Self-pay | Admitting: Gastroenterology

## 2018-12-16 NOTE — Telephone Encounter (Signed)
[facility] called to request dicyclomine prescription for pt.

## 2018-12-17 MED ORDER — DICYCLOMINE HCL 10 MG PO CAPS: 10.0000 mg | ORAL_CAPSULE | Freq: Two times a day (BID) | ORAL | 6 refills | Status: DC

## 2018-12-17 NOTE — Telephone Encounter (Signed)
Sent prescription to patients pharmacy.  

## 2018-12-17 NOTE — Telephone Encounter (Signed)
I do not see where you prescribed this medication. If you would like to give it to her please send directions.

## 2018-12-17 NOTE — Telephone Encounter (Signed)
Dicyclomine 10 mg p.o. twice daily, 60, 6 refills Thx  RG

## 2018-12-17 NOTE — Telephone Encounter (Signed)
Please see msg below

## 2018-12-23 DIAGNOSIS — I129 Hypertensive chronic kidney disease with stage 1 through stage 4 chronic kidney disease, or unspecified chronic kidney disease: Secondary | ICD-10-CM | POA: Diagnosis not present

## 2018-12-23 DIAGNOSIS — N183 Chronic kidney disease, stage 3 (moderate): Secondary | ICD-10-CM | POA: Diagnosis not present

## 2018-12-27 ENCOUNTER — Telehealth: Payer: Self-pay | Admitting: Gastroenterology

## 2018-12-27 DIAGNOSIS — D61818 Other pancytopenia: Secondary | ICD-10-CM

## 2018-12-27 DIAGNOSIS — R197 Diarrhea, unspecified: Secondary | ICD-10-CM

## 2018-12-27 NOTE — Telephone Encounter (Signed)
Patient reports she cannot sit up straight in a chair as the bloating is terrible; patient also reports she has been taking the Bentyl and the Compazine due to the medication making the stomach pains worse-she stopped the medications on 12/26/2018; patient reports she has only been able to eat small meals such as a piece of fried chicken and corn (from [facility]) before she is feeling bloated and really full; patient reports she has vomited 3 separate times after eating due to "feeling too full" and also of having diarrhea after eating greasy foods;  Please advise RN of next step in plan of care;

## 2018-12-27 NOTE — Telephone Encounter (Signed)
Pt's husband reported that pt is having severe abd p with swelling and N/V.  Please advise.

## 2018-12-28 NOTE — Telephone Encounter (Signed)
Called and spoke with patient-informed patient of MD recommendations and patient is agreeable to plan of care; lab orders placed in Epic; patient requested pamphlet for low fat diet-mailed on 12/28/2018; follow up appt made with MD on 01/14/2019 at 2:00pm; patient aware of need for nephrology notes drom Dr. Linton Ham to call Dr. Carolin Sicks and have records faxed to [phone]; patient reports she is not using any proetin supplements due to adverse symptoms when used; patient verbalized understanding of information/instructions and was also advised to call back to the office should questions/concerns arise;

## 2018-12-28 NOTE — Telephone Encounter (Signed)
-  Stool studies for GI Pathogen (includes C. Diff), WBCs, culture, fecal elastase, fat. -Low fat diet.  -Is she using any protien supplements or ensure. If yes, stop. -Check CBC, CMP, CRP. -Lets make FU appt with me or APP clinic in 1-2 weeks. - May need trial of pancreatic enzymes. -Last notes from nephrology (Dr Carolin Sicks)  [place]

## 2018-12-30 ENCOUNTER — Other Ambulatory Visit (INDEPENDENT_AMBULATORY_CARE_PROVIDER_SITE_OTHER): Payer: PPO

## 2018-12-30 DIAGNOSIS — R197 Diarrhea, unspecified: Secondary | ICD-10-CM

## 2018-12-30 DIAGNOSIS — D61818 Other pancytopenia: Secondary | ICD-10-CM | POA: Diagnosis not present

## 2018-12-30 LAB — COMPREHENSIVE METABOLIC PANEL
ALT: 15 U/L (ref 0–35)
AST: 75 U/L — ABNORMAL HIGH (ref 0–37)
Albumin: 2.3 g/dL — ABNORMAL LOW (ref 3.5–5.2)
Alkaline Phosphatase: 246 U/L — ABNORMAL HIGH (ref 39–117)
BUN: 17 mg/dL (ref 6–23)
CO2: 23 mEq/L (ref 19–32)
Calcium: 7.8 mg/dL — ABNORMAL LOW (ref 8.4–10.5)
Chloride: 108 mEq/L (ref 96–112)
Creatinine, Ser: 1.52 mg/dL — ABNORMAL HIGH (ref 0.40–1.20)
GFR: 34.12 mL/min — ABNORMAL LOW (ref >60.00)
Glucose, Bld: 110 mg/dL — ABNORMAL HIGH (ref 70–99)
Potassium: 4.7 mEq/L (ref 3.5–5.1)
Sodium: 139 mEq/L (ref 135–145)
Total Bilirubin: 1.7 mg/dL — ABNORMAL HIGH (ref 0.2–1.2)
Total Protein: 5.5 g/dL — ABNORMAL LOW (ref 6.0–8.3)

## 2018-12-30 LAB — CBC WITH DIFFERENTIAL/PLATELET
Basophils Absolute: 0.3 10*3/uL — ABNORMAL HIGH (ref 0.0–0.1)
Basophils Relative: 3.7 % — ABNORMAL HIGH (ref 0.0–3.0)
Eosinophils Absolute: 0.2 10*3/uL (ref 0.0–0.7)
Eosinophils Relative: 1.8 % (ref 0.0–5.0)
HCT: 35.8 % — ABNORMAL LOW (ref 36.0–46.0)
Hemoglobin: 11.5 g/dL — ABNORMAL LOW (ref 12.0–15.0)
Lymphocytes Relative: 44.3 % (ref 12.0–46.0)
Lymphs Abs: 3.9 10*3/uL (ref 0.7–4.0)
MCHC: 32.1 g/dL (ref 30.0–36.0)
MCV: 96.5 fl (ref 78.0–100.0)
Monocytes Absolute: 0.6 10*3/uL (ref 0.1–1.0)
Monocytes Relative: 6.8 % (ref 3.0–12.0)
Neutro Abs: 3.8 10*3/uL (ref 1.4–7.7)
Neutrophils Relative %: 43.4 % (ref 43.0–77.0)
Platelets: 272 10*3/uL (ref 150.0–400.0)
RBC: 3.71 Mil/uL — ABNORMAL LOW (ref 3.87–5.11)
RDW: 16.8 % — ABNORMAL HIGH (ref 11.5–15.5)
WBC: 8.8 10*3/uL (ref 4.0–10.5)

## 2018-12-30 LAB — C-REACTIVE PROTEIN: CRP: 2.9 mg/dL (ref 0.5–20.0)

## 2018-12-30 NOTE — Progress Notes (Unsigned)
c 

## 2018-12-31 ENCOUNTER — Other Ambulatory Visit: Payer: PPO

## 2018-12-31 DIAGNOSIS — R197 Diarrhea, unspecified: Secondary | ICD-10-CM | POA: Diagnosis not present

## 2018-12-31 DIAGNOSIS — D61818 Other pancytopenia: Secondary | ICD-10-CM

## 2019-01-04 ENCOUNTER — Telehealth: Payer: Self-pay | Admitting: Gastroenterology

## 2019-01-04 NOTE — Telephone Encounter (Signed)
I have reviewed the labs. LFTs and creatinine is getting better. Stool studies are negative for except for WBCs so far. Please inform the patient about the results  Plan: -Proceed with CT Abdo/pelvis with p.o. contrast.  If radiology can give IV contrast, they will be great as well.  Please make them aware of the creatinine. -Trial of Reglan low-dose 5 mg p.o. twice daily, half an hour before meals x 2 weeks for now.  RG

## 2019-01-04 NOTE — Telephone Encounter (Signed)
Called and spoke with patient-patient reports she is still throwing up at least once per day (not taking Zofran/Compazine-makes the patient's stomach feel worse); stomach hurts nonstop and is still enlarged; no relief from Imodium (taking one tablet one time per day); patient reports stomach is "constantly growling"; not able to really eat anything-partial meals;   Please advise on next step in plan of care=

## 2019-01-05 ENCOUNTER — Other Ambulatory Visit: Payer: Self-pay

## 2019-01-05 DIAGNOSIS — R1084 Generalized abdominal pain: Secondary | ICD-10-CM

## 2019-01-05 DIAGNOSIS — R112 Nausea with vomiting, unspecified: Secondary | ICD-10-CM

## 2019-01-05 DIAGNOSIS — R140 Abdominal distension (gaseous): Secondary | ICD-10-CM

## 2019-01-05 DIAGNOSIS — R197 Diarrhea, unspecified: Secondary | ICD-10-CM

## 2019-01-05 MED ORDER — METOCLOPRAMIDE HCL 5 MG PO TABS: 5.0000 mg | ORAL_TABLET | Freq: Two times a day (BID) | ORAL | 0 refills | Status: DC

## 2019-01-05 NOTE — Progress Notes (Signed)
Called and spoke with patient-patient informed of result note and MD recommendations; patient is agreeable with plan of care and verified PCP; result note routed to PCP per MD recommendations;   CT abd/pel ordered to be done at [facility]-to be scheduled by imaging department as patient will be coming to pick up the contrast for the procedure along with instructions for the scan;  Reglan RX submitted to pharmacy of patient choice;  Patient verbalized understanding of information/instructions;  Patient was advised to call the office at [phone] if questions/concerns arise;

## 2019-01-06 ENCOUNTER — Inpatient Hospital Stay (HOSPITAL_COMMUNITY)
Admission: EM | Admit: 2019-01-06 | Discharge: 2019-02-11 | DRG: 441 | Disposition: A | Discharge status: Hospice | Payer: PPO | Attending: Internal Medicine | Admitting: Internal Medicine

## 2019-01-06 ENCOUNTER — Encounter (HOSPITAL_BASED_OUTPATIENT_CLINIC_OR_DEPARTMENT_OTHER): Payer: Self-pay

## 2019-01-06 ENCOUNTER — Encounter (HOSPITAL_COMMUNITY): Payer: Self-pay

## 2019-01-06 ENCOUNTER — Ambulatory Visit (HOSPITAL_BASED_OUTPATIENT_CLINIC_OR_DEPARTMENT_OTHER)
Admission: RE | Admit: 2019-01-06 | Discharge: 2019-01-06 | Disposition: A | Discharge status: Home / Self Care | Payer: PPO | Source: Ambulatory Visit | Attending: Gastroenterology | Admitting: Gastroenterology

## 2019-01-06 ENCOUNTER — Observation Stay (HOSPITAL_COMMUNITY): Payer: PPO

## 2019-01-06 ENCOUNTER — Other Ambulatory Visit: Payer: Self-pay

## 2019-01-06 DIAGNOSIS — R1110 Vomiting, unspecified: Secondary | ICD-10-CM | POA: Diagnosis not present

## 2019-01-06 DIAGNOSIS — K648 Other hemorrhoids: Secondary | ICD-10-CM | POA: Diagnosis present

## 2019-01-06 DIAGNOSIS — E7800 Pure hypercholesterolemia, unspecified: Secondary | ICD-10-CM | POA: Diagnosis present

## 2019-01-06 DIAGNOSIS — I776 Arteritis, unspecified: Secondary | ICD-10-CM

## 2019-01-06 DIAGNOSIS — I959 Hypotension, unspecified: Secondary | ICD-10-CM | POA: Diagnosis not present

## 2019-01-06 DIAGNOSIS — R1310 Dysphagia, unspecified: Secondary | ICD-10-CM | POA: Diagnosis not present

## 2019-01-06 DIAGNOSIS — K210 Gastro-esophageal reflux disease with esophagitis: Secondary | ICD-10-CM | POA: Diagnosis present

## 2019-01-06 DIAGNOSIS — E43 Unspecified severe protein-calorie malnutrition: Secondary | ICD-10-CM | POA: Diagnosis present

## 2019-01-06 DIAGNOSIS — E785 Hyperlipidemia, unspecified: Secondary | ICD-10-CM | POA: Diagnosis present

## 2019-01-06 DIAGNOSIS — J90 Pleural effusion, not elsewhere classified: Secondary | ICD-10-CM | POA: Diagnosis not present

## 2019-01-06 DIAGNOSIS — M0579 Rheumatoid arthritis with rheumatoid factor of multiple sites without organ or systems involvement: Secondary | ICD-10-CM | POA: Diagnosis present

## 2019-01-06 DIAGNOSIS — A0839 Other viral enteritis: Secondary | ICD-10-CM | POA: Diagnosis present

## 2019-01-06 DIAGNOSIS — K8689 Other specified diseases of pancreas: Secondary | ICD-10-CM | POA: Diagnosis not present

## 2019-01-06 DIAGNOSIS — E162 Hypoglycemia, unspecified: Secondary | ICD-10-CM | POA: Diagnosis not present

## 2019-01-06 DIAGNOSIS — R945 Abnormal results of liver function studies: Secondary | ICD-10-CM | POA: Diagnosis not present

## 2019-01-06 DIAGNOSIS — K760 Fatty (change of) liver, not elsewhere classified: Secondary | ICD-10-CM | POA: Diagnosis not present

## 2019-01-06 DIAGNOSIS — K7460 Unspecified cirrhosis of liver: Secondary | ICD-10-CM | POA: Diagnosis present

## 2019-01-06 DIAGNOSIS — Z8639 Personal history of other endocrine, nutritional and metabolic disease: Secondary | ICD-10-CM

## 2019-01-06 DIAGNOSIS — Z515 Encounter for palliative care: Secondary | ICD-10-CM | POA: Diagnosis not present

## 2019-01-06 DIAGNOSIS — I5031 Acute diastolic (congestive) heart failure: Secondary | ICD-10-CM | POA: Diagnosis not present

## 2019-01-06 DIAGNOSIS — G92 Toxic encephalopathy: Secondary | ICD-10-CM | POA: Diagnosis not present

## 2019-01-06 DIAGNOSIS — K7581 Nonalcoholic steatohepatitis (NASH): Secondary | ICD-10-CM | POA: Diagnosis present

## 2019-01-06 DIAGNOSIS — R112 Nausea with vomiting, unspecified: Secondary | ICD-10-CM

## 2019-01-06 DIAGNOSIS — R188 Other ascites: Secondary | ICD-10-CM | POA: Diagnosis present

## 2019-01-06 DIAGNOSIS — Z0189 Encounter for other specified special examinations: Secondary | ICD-10-CM | POA: Diagnosis not present

## 2019-01-06 DIAGNOSIS — R918 Other nonspecific abnormal finding of lung field: Secondary | ICD-10-CM | POA: Diagnosis not present

## 2019-01-06 DIAGNOSIS — J810 Acute pulmonary edema: Secondary | ICD-10-CM | POA: Diagnosis not present

## 2019-01-06 DIAGNOSIS — R5381 Other malaise: Secondary | ICD-10-CM | POA: Diagnosis not present

## 2019-01-06 DIAGNOSIS — K2980 Duodenitis without bleeding: Secondary | ICD-10-CM | POA: Diagnosis not present

## 2019-01-06 DIAGNOSIS — R911 Solitary pulmonary nodule: Secondary | ICD-10-CM | POA: Diagnosis present

## 2019-01-06 DIAGNOSIS — Z833 Family history of diabetes mellitus: Secondary | ICD-10-CM | POA: Diagnosis not present

## 2019-01-06 DIAGNOSIS — J440 Chronic obstructive pulmonary disease with acute lower respiratory infection: Secondary | ICD-10-CM | POA: Diagnosis present

## 2019-01-06 DIAGNOSIS — Z7401 Bed confinement status: Secondary | ICD-10-CM | POA: Diagnosis not present

## 2019-01-06 DIAGNOSIS — E870 Hyperosmolality and hypernatremia: Secondary | ICD-10-CM | POA: Diagnosis not present

## 2019-01-06 DIAGNOSIS — K529 Noninfective gastroenteritis and colitis, unspecified: Secondary | ICD-10-CM | POA: Diagnosis not present

## 2019-01-06 DIAGNOSIS — R7989 Other specified abnormal findings of blood chemistry: Secondary | ICD-10-CM

## 2019-01-06 DIAGNOSIS — J189 Pneumonia, unspecified organism: Secondary | ICD-10-CM | POA: Diagnosis present

## 2019-01-06 DIAGNOSIS — K228 Other specified diseases of esophagus: Secondary | ICD-10-CM | POA: Diagnosis not present

## 2019-01-06 DIAGNOSIS — R9389 Abnormal findings on diagnostic imaging of other specified body structures: Secondary | ICD-10-CM

## 2019-01-06 DIAGNOSIS — R1084 Generalized abdominal pain: Secondary | ICD-10-CM

## 2019-01-06 DIAGNOSIS — K5730 Diverticulosis of large intestine without perforation or abscess without bleeding: Secondary | ICD-10-CM | POA: Diagnosis present

## 2019-01-06 DIAGNOSIS — Z8619 Personal history of other infectious and parasitic diseases: Secondary | ICD-10-CM

## 2019-01-06 DIAGNOSIS — K625 Hemorrhage of anus and rectum: Secondary | ICD-10-CM | POA: Diagnosis not present

## 2019-01-06 DIAGNOSIS — J984 Other disorders of lung: Secondary | ICD-10-CM | POA: Diagnosis not present

## 2019-01-06 DIAGNOSIS — K269 Duodenal ulcer, unspecified as acute or chronic, without hemorrhage or perforation: Secondary | ICD-10-CM | POA: Diagnosis not present

## 2019-01-06 DIAGNOSIS — E872 Acidosis, unspecified: Secondary | ICD-10-CM

## 2019-01-06 DIAGNOSIS — E876 Hypokalemia: Secondary | ICD-10-CM | POA: Diagnosis not present

## 2019-01-06 DIAGNOSIS — R197 Diarrhea, unspecified: Secondary | ICD-10-CM | POA: Diagnosis present

## 2019-01-06 DIAGNOSIS — E039 Hypothyroidism, unspecified: Secondary | ICD-10-CM | POA: Diagnosis present

## 2019-01-06 DIAGNOSIS — Z8679 Personal history of other diseases of the circulatory system: Secondary | ICD-10-CM

## 2019-01-06 DIAGNOSIS — K264 Chronic or unspecified duodenal ulcer with hemorrhage: Secondary | ICD-10-CM | POA: Diagnosis present

## 2019-01-06 DIAGNOSIS — Z20828 Contact with and (suspected) exposure to other viral communicable diseases: Secondary | ICD-10-CM | POA: Diagnosis present

## 2019-01-06 DIAGNOSIS — K644 Residual hemorrhoidal skin tags: Secondary | ICD-10-CM | POA: Diagnosis present

## 2019-01-06 DIAGNOSIS — K766 Portal hypertension: Secondary | ICD-10-CM | POA: Diagnosis present

## 2019-01-06 DIAGNOSIS — I517 Cardiomegaly: Secondary | ICD-10-CM | POA: Diagnosis not present

## 2019-01-06 DIAGNOSIS — Z79890 Hormone replacement therapy (postmenopausal): Secondary | ICD-10-CM

## 2019-01-06 DIAGNOSIS — Z803 Family history of malignant neoplasm of breast: Secondary | ICD-10-CM

## 2019-01-06 DIAGNOSIS — N179 Acute kidney failure, unspecified: Secondary | ICD-10-CM | POA: Diagnosis present

## 2019-01-06 DIAGNOSIS — K767 Hepatorenal syndrome: Principal | ICD-10-CM | POA: Diagnosis present

## 2019-01-06 DIAGNOSIS — D638 Anemia in other chronic diseases classified elsewhere: Secondary | ICD-10-CM | POA: Diagnosis present

## 2019-01-06 DIAGNOSIS — E639 Nutritional deficiency, unspecified: Secondary | ICD-10-CM | POA: Diagnosis not present

## 2019-01-06 DIAGNOSIS — R627 Adult failure to thrive: Secondary | ICD-10-CM | POA: Diagnosis present

## 2019-01-06 DIAGNOSIS — J45909 Unspecified asthma, uncomplicated: Secondary | ICD-10-CM | POA: Diagnosis not present

## 2019-01-06 DIAGNOSIS — Z8249 Family history of ischemic heart disease and other diseases of the circulatory system: Secondary | ICD-10-CM

## 2019-01-06 DIAGNOSIS — N184 Chronic kidney disease, stage 4 (severe): Secondary | ICD-10-CM | POA: Diagnosis present

## 2019-01-06 DIAGNOSIS — N178 Other acute kidney failure: Secondary | ICD-10-CM | POA: Diagnosis not present

## 2019-01-06 DIAGNOSIS — Z96649 Presence of unspecified artificial hip joint: Secondary | ICD-10-CM | POA: Diagnosis present

## 2019-01-06 DIAGNOSIS — Z6826 Body mass index (BMI) 26.0-26.9, adult: Secondary | ICD-10-CM

## 2019-01-06 DIAGNOSIS — F419 Anxiety disorder, unspecified: Secondary | ICD-10-CM | POA: Diagnosis present

## 2019-01-06 DIAGNOSIS — R0902 Hypoxemia: Secondary | ICD-10-CM

## 2019-01-06 DIAGNOSIS — Z79899 Other long term (current) drug therapy: Secondary | ICD-10-CM

## 2019-01-06 DIAGNOSIS — I7782 Antineutrophilic cytoplasmic antibody (ANCA) vasculitis: Secondary | ICD-10-CM

## 2019-01-06 DIAGNOSIS — R21 Rash and other nonspecific skin eruption: Secondary | ICD-10-CM | POA: Diagnosis not present

## 2019-01-06 DIAGNOSIS — B259 Cytomegaloviral disease, unspecified: Secondary | ICD-10-CM | POA: Diagnosis not present

## 2019-01-06 DIAGNOSIS — Z66 Do not resuscitate: Secondary | ICD-10-CM | POA: Diagnosis not present

## 2019-01-06 DIAGNOSIS — K6389 Other specified diseases of intestine: Secondary | ICD-10-CM | POA: Diagnosis not present

## 2019-01-06 DIAGNOSIS — R0602 Shortness of breath: Secondary | ICD-10-CM

## 2019-01-06 DIAGNOSIS — M2550 Pain in unspecified joint: Secondary | ICD-10-CM | POA: Diagnosis not present

## 2019-01-06 DIAGNOSIS — N183 Chronic kidney disease, stage 3 (moderate): Secondary | ICD-10-CM | POA: Diagnosis not present

## 2019-01-06 DIAGNOSIS — R609 Edema, unspecified: Secondary | ICD-10-CM | POA: Diagnosis not present

## 2019-01-06 DIAGNOSIS — Z03818 Encounter for observation for suspected exposure to other biological agents ruled out: Secondary | ICD-10-CM | POA: Diagnosis not present

## 2019-01-06 DIAGNOSIS — K209 Esophagitis, unspecified: Secondary | ICD-10-CM | POA: Diagnosis not present

## 2019-01-06 DIAGNOSIS — R630 Anorexia: Secondary | ICD-10-CM | POA: Diagnosis not present

## 2019-01-06 DIAGNOSIS — R140 Abdominal distension (gaseous): Secondary | ICD-10-CM

## 2019-01-06 DIAGNOSIS — Z91013 Allergy to seafood: Secondary | ICD-10-CM

## 2019-01-06 DIAGNOSIS — J449 Chronic obstructive pulmonary disease, unspecified: Secondary | ICD-10-CM | POA: Diagnosis not present

## 2019-01-06 DIAGNOSIS — Z4682 Encounter for fitting and adjustment of non-vascular catheter: Secondary | ICD-10-CM | POA: Diagnosis not present

## 2019-01-06 DIAGNOSIS — I129 Hypertensive chronic kidney disease with stage 1 through stage 4 chronic kidney disease, or unspecified chronic kidney disease: Secondary | ICD-10-CM | POA: Diagnosis present

## 2019-01-06 DIAGNOSIS — K909 Intestinal malabsorption, unspecified: Secondary | ICD-10-CM | POA: Diagnosis not present

## 2019-01-06 DIAGNOSIS — E8770 Fluid overload, unspecified: Secondary | ICD-10-CM | POA: Diagnosis not present

## 2019-01-06 DIAGNOSIS — Z888 Allergy status to other drugs, medicaments and biological substances status: Secondary | ICD-10-CM

## 2019-01-06 DIAGNOSIS — R601 Generalized edema: Secondary | ICD-10-CM | POA: Diagnosis not present

## 2019-01-06 DIAGNOSIS — Z7189 Other specified counseling: Secondary | ICD-10-CM | POA: Diagnosis not present

## 2019-01-06 DIAGNOSIS — K3189 Other diseases of stomach and duodenum: Secondary | ICD-10-CM | POA: Diagnosis not present

## 2019-01-06 DIAGNOSIS — E8809 Other disorders of plasma-protein metabolism, not elsewhere classified: Secondary | ICD-10-CM | POA: Diagnosis not present

## 2019-01-06 DIAGNOSIS — D690 Allergic purpura: Secondary | ICD-10-CM | POA: Diagnosis present

## 2019-01-06 HISTORY — DX: Other pancytopenia: D61.818

## 2019-01-06 LAB — COMPREHENSIVE METABOLIC PANEL
ALT: 20 U/L (ref 0–44)
AST: 96 U/L — ABNORMAL HIGH (ref 15–41)
Albumin: 2.2 g/dL — ABNORMAL LOW (ref 3.5–5.0)
Alkaline Phosphatase: 258 U/L — ABNORMAL HIGH (ref 38–126)
Anion gap: 15 (ref 5–15)
BUN: 21 mg/dL (ref 8–23)
CO2: 16 mmol/L — ABNORMAL LOW (ref 22–32)
Calcium: 7.9 mg/dL — ABNORMAL LOW (ref 8.9–10.3)
Chloride: 107 mmol/L (ref 98–111)
Creatinine, Ser: 1.66 mg/dL — ABNORMAL HIGH (ref 0.44–1.00)
GFR calc Af Amer: 37 mL/min — ABNORMAL LOW (ref >60)
GFR calc non Af Amer: 32 mL/min — ABNORMAL LOW (ref >60)
Glucose, Bld: 90 mg/dL (ref 70–99)
Potassium: 4.9 mmol/L (ref 3.5–5.1)
Sodium: 138 mmol/L (ref 135–145)
Total Bilirubin: 2.6 mg/dL — ABNORMAL HIGH (ref 0.3–1.2)
Total Protein: 5.8 g/dL — ABNORMAL LOW (ref 6.5–8.1)

## 2019-01-06 LAB — CBC
HCT: 33 % — ABNORMAL LOW (ref 36.0–46.0)
Hemoglobin: 10.5 g/dL — ABNORMAL LOW (ref 12.0–15.0)
MCH: 31.1 pg (ref 26.0–34.0)
MCHC: 31.8 g/dL (ref 30.0–36.0)
MCV: 97.6 fL (ref 80.0–100.0)
Platelets: 225 10*3/uL (ref 150–400)
RBC: 3.38 MIL/uL — ABNORMAL LOW (ref 3.87–5.11)
RDW: 18 % — ABNORMAL HIGH (ref 11.5–15.5)
WBC: 10.9 10*3/uL — ABNORMAL HIGH (ref 4.0–10.5)
nRBC: 0 % (ref 0.0–0.2)

## 2019-01-06 LAB — POC OCCULT BLOOD, ED: Fecal Occult Bld: NEGATIVE

## 2019-01-06 LAB — TYPE AND SCREEN
ABO/RH(D): B POS
Antibody Screen: NEGATIVE

## 2019-01-06 LAB — SARS CORONAVIRUS 2 BY RT PCR (HOSPITAL ORDER, PERFORMED IN ~~LOC~~ HOSPITAL LAB): SARS Coronavirus 2: NEGATIVE

## 2019-01-06 MED ORDER — SODIUM CHLORIDE 0.9 % IV BOLUS (SEPSIS)
500.0000 mL | Freq: Once | INTRAVENOUS | Status: AC
  Administered 2019-01-06: 500 mL via INTRAVENOUS

## 2019-01-06 MED ORDER — ENOXAPARIN SODIUM 30 MG/0.3ML ~~LOC~~ SOLN
30.0000 mg | Freq: Every day | SUBCUTANEOUS | Status: DC
  Administered 2019-01-07: 30 mg via SUBCUTANEOUS
  Filled 2019-01-06: qty 0.3

## 2019-01-06 MED ORDER — PRO-STAT SUGAR FREE PO LIQD
30.0000 mL | Freq: Two times a day (BID) | ORAL | Status: DC
  Administered 2019-01-07 – 2019-01-20 (×16): 30 mL via ORAL
  Filled 2019-01-06 (×22): qty 30

## 2019-01-06 MED ORDER — SODIUM CHLORIDE 0.9 % IV SOLN
1000.0000 mL | INTRAVENOUS | Status: DC
  Administered 2019-01-06: 1000 mL via INTRAVENOUS

## 2019-01-06 MED ORDER — IOHEXOL 300 MG/ML  SOLN
100.0000 mL | Freq: Once | INTRAMUSCULAR | Status: AC | PRN
  Administered 2019-01-06: 80 mL via INTRAVENOUS

## 2019-01-06 NOTE — ED Notes (Signed)
Attempted IV x 2, unable to get successful IV< Small amt of blood sent to lab, will order IV team consult.

## 2019-01-06 NOTE — ED Provider Notes (Signed)
[facility] DEPT Provider Note   CSN: [id number] Arrival date & time: 01/06/19  1630    History   Chief Complaint Chief Complaint  Patient presents with   GI Bleeding   abdominal pain    HPI Kaylee Rasmussen is a 68 y.o. female.     HPI Patient has been having difficulty with diarrhea since December.  She was admitted to the hospital from mid January to February for her diarrhea.  Patient started having recurrent diarrhea again.  She has 5-6 episodes per day.  Sent a message to her doctor recently because she has had increasing abdominal bloating.  She has not been able to eat much.  She continues to feel full after eating.  Her doctor ordered stool studies and laboratory test.  Last evening the patient noticed blood in her stool.  She continued to have the diarrhea symptoms.  She continued with the abdominal bloating.  She called her GI doctor today and was instructed to come to the emergency room.  Patient did have an abdominal pelvic CT scan completed today  Past Medical History:  Diagnosis Date   Asthma    High cholesterol    Hypertension    Rheumatoid arthritis ([place])    Vertigo     Patient Active Problem List   Diagnosis Date Noted   Rheumatoid arthritis involving multiple sites with positive rheumatoid factor ([place]) 08/27/2017   High risk medication use 08/27/2017   Primary osteoarthritis of both knees 08/27/2017   History of total hip replacement, right 08/27/2017   Renal calcinosis 08/27/2017   History of hypertension 08/27/2017   History of hypothyroidism 08/27/2017   History of hypercholesterolemia 08/27/2017    Past Surgical History:  Procedure Laterality Date   ABDOMINAL HYSTERECTOMY     HIP ARTHROPLASTY     Lumbarectomy       OB History   No obstetric history on file.      Home Medications    Prior to Admission medications   Medication Sig Start Date End Date Taking? Authorizing Provider  amLODipine  (NORVASC) 5 MG tablet Take 2.5 mg by mouth at bedtime.   Yes [provider]  Calcium Carbonate-Vitamin D (CALCIUM 600+D PO) Take 1 tablet by mouth 2 (two) times daily.   Yes [provider]  escitalopram (LEXAPRO) 10 MG tablet Take 10 mg by mouth daily. 03/23/17  Yes [provider]  levothyroxine (SYNTHROID, LEVOTHROID) 88 MCG tablet Take 88 mcg by mouth daily before breakfast.  03/12/17  Yes [provider]  metoprolol succinate (TOPROL-XL) 100 MG 24 hr tablet Take 100 mg by mouth daily. 03/24/17  Yes [provider]  potassium chloride (MICRO-K) 10 MEQ CR capsule Take 10 mEq by mouth daily.   Yes [provider]  PROAIR HFA 108 (90 Base) MCG/ACT inhaler Inhale 1-2 puffs into the lungs every 4 (four) hours as needed for wheezing.  05/05/16  Yes [provider]  SODIUM BICARBONATE PO Take by mouth 2 (two) times daily. 10 gains twice  daily   Yes [provider]  dicyclomine (BENTYL) 10 MG capsule Take 1 capsule (10 mg total) by mouth 2 (two) times a day. Patient not taking: Reported on 01/06/2019 12/17/18   Jackquline Denmark, MD  metoCLOPramide (REGLAN) 5 MG tablet Take 1 tablet (5 mg total) by mouth 2 (two) times a day for 14 days. half an hour before meals x 2 weeks for now 01/05/19 01/19/19  Jackquline Denmark, MD  ondansetron (  ZOFRAN ODT) 4 MG disintegrating tablet Take 1 tablet (4 mg total) by mouth every 6 (six) hours as needed for nausea or vomiting. Patient not taking: Reported on 11/17/2018 10/28/18   Jackquline Denmark, MD  prochlorperazine (COMPAZINE) 10 MG tablet Take 1 tablet (10 mg total) by mouth 2 (two) times a day. Patient not taking: Reported on 01/06/2019 12/15/18   Jackquline Denmark, MD    Family History Family History  Problem Relation Age of Onset   Diabetes Mother    Heart attack Mother    Heart attack Father    Breast cancer Maternal Aunt    Colon cancer Neg Hx    Esophageal cancer Neg Hx     Social History Social  History   Tobacco Use   Smoking status: Never Smoker   Smokeless tobacco: Never Used  Substance Use Topics   Alcohol use: No   Drug use: No     Allergies   Cozaar [losartan potassium] and Scallops [shellfish allergy]   Review of Systems Review of Systems  All other systems reviewed and are negative.    Physical Exam Updated Vital Signs BP (!) 128/57    Pulse 64    Temp 98.1 F (36.7 C) (Oral)    Resp 20    Ht 1.549 m (5\' 1" )    Wt 63.5 kg    SpO2 98%    BMI 26.45 kg/m   Physical Exam Vitals signs and nursing note reviewed.  Constitutional:      Appearance: She is well-developed. She is ill-appearing.  HENT:     Head: Normocephalic and atraumatic.     Right Ear: External ear normal.     Left Ear: External ear normal.  Eyes:     General: No scleral icterus.       Right eye: No discharge.        Left eye: No discharge.     Conjunctiva/sclera: Conjunctivae normal.  Neck:     Musculoskeletal: Neck supple.     Trachea: No tracheal deviation.  Cardiovascular:     Rate and Rhythm: Normal rate and regular rhythm.  Pulmonary:     Effort: Pulmonary effort is normal. No respiratory distress.     Breath sounds: Normal breath sounds. No stridor. No wheezing or rales.  Abdominal:     General: Bowel sounds are normal. There is no distension.     Palpations: Abdomen is soft.     Tenderness: There is no abdominal tenderness. There is no guarding or rebound.  Genitourinary:    Comments: Patient had a diarrhea stool in the emergency room, the stool is mucoid and yellow, no gross blood noted Musculoskeletal:        General: No tenderness.  Skin:    General: Skin is warm and dry.     Findings: No rash.  Neurological:     Mental Status: She is alert.     Cranial Nerves: No cranial nerve deficit (no facial droop, extraocular movements intact, no slurred speech).     Sensory: No sensory deficit.     Motor: No abnormal muscle tone or seizure activity.     Coordination:  Coordination normal.      ED Treatments / Results  Labs (all labs ordered are listed, but only abnormal results are displayed) Labs Reviewed  COMPREHENSIVE METABOLIC PANEL - Abnormal; Notable for the following components:      Result Value   CO2 16 (*)    Creatinine, Ser 1.66 (*)    Calcium  7.9 (*)    Total Protein 5.8 (*)    Albumin 2.2 (*)    AST 96 (*)    Alkaline Phosphatase 258 (*)    Total Bilirubin 2.6 (*)    GFR calc non Af Amer 32 (*)    GFR calc Af Amer 37 (*)    All other components within normal limits  CBC - Abnormal; Notable for the following components:   WBC 10.9 (*)    RBC 3.38 (*)    Hemoglobin 10.5 (*)    HCT 33.0 (*)    RDW 18.0 (*)    All other components within normal limits  SARS CORONAVIRUS 2 (HOSPITAL ORDER, [facility])  HEPATITIS PANEL, ACUTE  POC OCCULT BLOOD, ED  TYPE AND SCREEN    EKG None  Radiology Ct Abdomen Pelvis W Contrast  Result Date: 01/06/2019 CLINICAL DATA:  Abdominal pain, nausea, vomiting and bloating. EXAM: CT ABDOMEN AND PELVIS WITH CONTRAST TECHNIQUE: Multidetector CT imaging of the abdomen and pelvis was performed using the standard protocol following bolus administration of intravenous contrast. CONTRAST:  6mL OMNIPAQUE IOHEXOL 300 MG/ML  SOLN COMPARISON:  04/24/2016 abdominal sonogram. FINDINGS: Lower chest: Small dependent bilateral pleural effusions, right greater than left. Left lower lobe 4 mm solid pulmonary nodule (series 3/image 7). Patchy bandlike consolidation in the left lower lobe. Coronary atherosclerosis. Hepatobiliary: Heterogeneous diffuse hepatic steatosis. Markedly heterogeneous liver parenchymal enhancement. No discrete liver mass. Questionable fine liver surface irregularity. Distended gallbladder. No gallbladder wall thickening. No radiopaque cholelithiasis. No biliary ductal dilatation. CBD diameter 5 mm. Pancreas: Normal, with no mass or duct dilation. Spleen: Normal size. No  mass. Adrenals/Urinary Tract: Normal adrenals. Normal kidneys with no hydronephrosis and no renal mass. Bladder obscured by streak artifact from right hip hardware. Bladder is nondistended and without gross abnormality. Stomach/Bowel: Normal non-distended stomach. Normal caliber small bowel with no small bowel wall thickening. Normal appendix. Mild left colonic diverticulosis. No large bowel wall thickening. Vascular/Lymphatic: Atherosclerotic nonaneurysmal abdominal aorta. Patent portal, splenic, hepatic and renal veins. No pathologically enlarged lymph nodes in the abdomen or pelvis. Reproductive: Status post hysterectomy, with no abnormal findings at the vaginal cuff. No adnexal mass. Other: No pneumoperitoneum. Moderate volume ascites with simple fluid density. No focal fluid collection. Mild anasarca. Musculoskeletal: No aggressive appearing focal osseous lesions. Right total hip arthroplasty. Mild superior T11 and T12, mild inferior L2 and mild superior L3 and L4 vertebral compression fractures of indeterminate chronicity. Mild thoracolumbar spondylosis. IMPRESSION: 1. Abnormal appearance of the liver with heterogeneous hepatic steatosis and heterogeneous liver enhancement. Questionable fine liver surface irregularity. Cannot exclude hepatic cirrhosis. No discrete liver masses. Recommend correlation with liver enzymes. 2. Third-spacing of fluid with moderate ascites, small dependent bilateral pleural effusions, right greater than left, and mild anasarca. 3. No evidence of bowel obstruction or acute bowel inflammation. Mild colonic diverticulosis, without convincing findings of acute diverticulitis. 4. Patchy bandlike consolidation in the left lung base, cannot exclude pneumonia or aspiration. 5. Left lung base 4 mm solid pulmonary nodule. No follow-up needed if patient is low-risk. Non-contrast chest CT can be considered in 12 months if patient is high-risk. This recommendation follows the consensus statement:  Guidelines for Management of Incidental Pulmonary Nodules Detected on CT Images:From the Fleischner Society 2017; published online before print (10.1148/radiol.6195093267). 6.  Aortic Atherosclerosis (ICD10-I70.0). Electronically Signed   By: Ilona Sorrel M.D.   On: 01/06/2019 17:58    Procedures Procedures (including critical care time)  Medications Ordered in ED  Medications  sodium chloride 0.9 % bolus 500 mL (500 mLs Intravenous New Bag/Given 01/06/19 2016)    Followed by  0.9 %  sodium chloride infusion (1,000 mLs Intravenous New Bag/Given 01/06/19 2013)     Initial Impression / Assessment and Plan / ED Course  I have reviewed the triage vital signs and the nursing notes.  Pertinent labs & imaging results that were available during my care of the patient were reviewed by me and considered in my medical decision making (see chart for details).  Clinical Course as of Jan 06 2148  Thu Jan 06, 2019  2037 Labs notable for elevated creatinine, decreased bicarb.  Bilirubin elevated   [JK]    Clinical Course User Index [JK] Dorie Rank, MD     Patient present to the ED for complaints of rectal bleeding, persistent diarrhea and abdominal bloating and weakness.  Patient's laboratory tests are notable for leukocytosis.  She has evidence of acute kidney injury and several electrolyte abnormalities.  Patient CT scan shows liver inflammation as well as ascites.  Hemoglobin is stable and her Hemoccult is negative.  No evidence of rectal bleeding at this time.  With her persistent diarrhea I think the patient would benefit from IV hydration and further evaluation of her liver inflammation and ascites.  Final Clinical Impressions(s) / ED Diagnoses   Final diagnoses:  Hyperbilirubinemia  AKI (acute kidney injury) ([place])  Other ascites  Pulmonary nodule  Metabolic acidosis      Dorie Rank, MD 01/06/19 2149

## 2019-01-06 NOTE — Telephone Encounter (Signed)
Called and spoke with patient's husband (Don)-DPR verified-informed Kaylee Rasmussen to take the patient to the nearest emergency department as Dr. Lyndel Safe would like for her to be evaluated further due to worsening symptoms; Don verbalized understanding of information/instructions; Don advised to call back to the office should questions/concerns arise;

## 2019-01-06 NOTE — Telephone Encounter (Signed)
She is just not getting better Now has started having LGI bleeding. Will need further evaluation  Please send her to ED for evaluation. She is already in radiology.  RG

## 2019-01-06 NOTE — ED Notes (Addendum)
ED TO INPATIENT HANDOFF REPORT  Name/Age/Gender Kaylee Rasmussen 68 y.o. female  Code Status    Code Status Orders  (From admission, onward)         Start     Ordered   01/06/19 2208  Full code  Continuous     01/06/19 2209        Code Status History    This patient has a current code status but no historical code status.   Advance Care Planning Activity    Advance Directive Documentation     Most Recent Value  Type of Advance Directive  Healthcare Power of Attorney, Living will  Pre-existing out of facility DNR order (yellow form or pink MOST form)  -  "MOST" Form in Place?  -      Home/SNF/Other Home  Chief Complaint rectal bleeding and diarrhea  Level of Care/Admitting Diagnosis ED Disposition    ED Disposition Condition [place]: [place] [100102]  Level of Care: Telemetry [5]  Admit to tele based on following criteria: Monitor for Ischemic changes  Covid Evaluation: Asymptomatic Screening Protocol (No Symptoms)  Diagnosis: ARF (acute renal failure) [facility]) [315400]  Admitting Physician: Jani Gravel [[id number]]  Attending Physician: Jani Gravel [[id number]]  PT Class (Do Not Modify): Observation [104]  PT Acc Code (Do Not Modify): Observation [10022]       Medical History Past Medical History:  Diagnosis Date  . Asthma   . High cholesterol   . Hypertension   . Rheumatoid arthritis ([place])   . Vertigo     Allergies Allergies  Allergen Reactions  . Cozaar [Losartan Potassium]   . Scallops [Shellfish Allergy] Rash    IV Location/Drains/Wounds Patient Lines/Drains/Airways Status   Active Line/Drains/Airways    Name:   Placement date:   Placement time:   Site:   Days:   Peripheral IV 01/06/19 Left Forearm   01/06/19    1536    Forearm   less than 1   Peripheral IV 01/06/19 Anterior;Left;Proximal Forearm   01/06/19    1920    Forearm   less than 1   Incision (Closed) 03/24/18 Flank Left   03/24/18    0848     288           Labs/Imaging Results for orders placed or performed during the hospital encounter of 01/06/19 (from the past 48 hour(s))  Comprehensive metabolic panel     Status: Abnormal   Collection Time: 01/06/19  5:44 PM  Result Value Ref Range   Sodium 138 135 - 145 mmol/L   Potassium 4.9 3.5 - 5.1 mmol/L   Chloride 107 98 - 111 mmol/L   CO2 16 (L) 22 - 32 mmol/L   Glucose, Bld 90 70 - 99 mg/dL   BUN 21 8 - 23 mg/dL   Creatinine, Ser 1.66 (H) 0.44 - 1.00 mg/dL   Calcium 7.9 (L) 8.9 - 10.3 mg/dL   Total Protein 5.8 (L) 6.5 - 8.1 g/dL   Albumin 2.2 (L) 3.5 - 5.0 g/dL   AST 96 (H) 15 - 41 U/L   ALT 20 0 - 44 U/L   Alkaline Phosphatase 258 (H) 38 - 126 U/L   Total Bilirubin 2.6 (H) 0.3 - 1.2 mg/dL   GFR calc non Af Amer 32 (L) >60 mL/min   GFR calc Af Amer 37 (L) >60 mL/min   Anion gap 15 5 - 15    Comment: Performed at [facility], [place] [address] [place], [place] [address]  CBC     Status: Abnormal   Collection Time: 01/06/19  5:59 PM  Result Value Ref Range   WBC 10.9 (H) 4.0 - 10.5 K/uL   RBC 3.38 (L) 3.87 - 5.11 MIL/uL   Hemoglobin 10.5 (L) 12.0 - 15.0 g/dL   HCT 33.0 (L) 36.0 - 46.0 %   MCV 97.6 80.0 - 100.0 fL   MCH 31.1 26.0 - 34.0 pg   MCHC 31.8 30.0 - 36.0 g/dL   RDW 18.0 (H) 11.5 - 15.5 %   Platelets 225 150 - 400 K/uL   nRBC 0.0 0.0 - 0.2 %    Comment: Performed at [facility], [place] [address], [place], [place] [address]  POC occult blood, ED     Status: None   Collection Time: 01/06/19  6:52 PM  Result Value Ref Range   Fecal Occult Bld NEGATIVE NEGATIVE  Type and screen [place]     Status: None   Collection Time: 01/06/19  7:20 PM  Result Value Ref Range   ABO/RH(D) B POS    Antibody Screen NEG    Sample Expiration      01/09/2019,2359 Performed at [facility], [place] [address], [place], [place] [address]   ABO/Rh     Status: None (Preliminary result)   Collection Time:  01/06/19  7:20 PM  Result Value Ref Range   ABO/RH(D)      B POS Performed at [facility], [place] [address] [place], [place] [address]   SARS Coronavirus 2 (CEPHEID - Performed in [place] hospital lab), Hosp Order     Status: None   Collection Time: 01/06/19  9:23 PM   Specimen: Nasopharyngeal Swab  Result Value Ref Range   SARS Coronavirus 2 NEGATIVE NEGATIVE    Comment: (NOTE) If result is NEGATIVE SARS-CoV-2 target nucleic acids are NOT DETECTED. The SARS-CoV-2 RNA is generally detectable in upper and lower  respiratory specimens during the acute phase of infection. The lowest  concentration of SARS-CoV-2 viral copies this assay can detect is 250  copies / mL. A negative result does not preclude SARS-CoV-2 infection  and should not be used as the sole basis for treatment or other  patient management decisions.  A negative result may occur with  improper specimen collection / handling, submission of specimen other  than nasopharyngeal swab, presence of viral mutation(s) within the  areas targeted by this assay, and inadequate number of viral copies  (<250 copies / mL). A negative result must be combined with clinical  observations, patient history, and epidemiological information. If result is POSITIVE SARS-CoV-2 target nucleic acids are DETECTED. The SARS-CoV-2 RNA is generally detectable in upper and lower  respiratory specimens dur ing the acute phase of infection.  Positive  results are indicative of active infection with SARS-CoV-2.  Clinical  correlation with patient history and other diagnostic information is  necessary to determine patient infection status.  Positive results do  not rule out bacterial infection or co-infection with other viruses. If result is PRESUMPTIVE POSTIVE SARS-CoV-2 nucleic acids MAY BE PRESENT.   A presumptive positive result was obtained on the submitted specimen  and confirmed on repeat testing.  While 2019 novel  coronavirus  (SARS-CoV-2) nucleic acids may be present in the submitted sample  additional confirmatory testing may be necessary for epidemiological  and / or clinical management purposes  to differentiate between  SARS-CoV-2  and other Sarbecovirus currently known to infect humans.  If clinically indicated additional testing with an alternate test  methodology [phone]) is advised. The SARS-CoV-2 RNA is generally  detectable in upper and lower respiratory sp ecimens during the acute  phase of infection. The expected result is Negative. Fact Sheet for Patients:  StrictlyIdeas.no Fact Sheet for Healthcare Providers: BankingDealers.co.za This test is not yet approved or cleared by the [place] FDA and has been authorized for detection and/or diagnosis of SARS-CoV-2 by FDA under an Emergency Use Authorization (EUA).  This EUA will remain in effect (meaning this test can be used) for the duration of the COVID-19 declaration under Section 564(b)(1) of the Act, 21 U.S.C. section 360bbb-3(b)(1), unless the authorization is terminated or revoked sooner. Performed at [facility], [place] [address] [place], [place] [address]    Ct Abdomen Pelvis W Contrast  Result Date: 01/06/2019 CLINICAL DATA:  Abdominal pain, nausea, vomiting and bloating. EXAM: CT ABDOMEN AND PELVIS WITH CONTRAST TECHNIQUE: Multidetector CT imaging of the abdomen and pelvis was performed using the standard protocol following bolus administration of intravenous contrast. CONTRAST:  7mL OMNIPAQUE IOHEXOL 300 MG/ML  SOLN COMPARISON:  04/24/2016 abdominal sonogram. FINDINGS: Lower chest: Small dependent bilateral pleural effusions, right greater than left. Left lower lobe 4 mm solid pulmonary nodule (series 3/image 7). Patchy bandlike consolidation in the left lower lobe. Coronary atherosclerosis. Hepatobiliary: Heterogeneous diffuse hepatic steatosis. Markedly  heterogeneous liver parenchymal enhancement. No discrete liver mass. Questionable fine liver surface irregularity. Distended gallbladder. No gallbladder wall thickening. No radiopaque cholelithiasis. No biliary ductal dilatation. CBD diameter 5 mm. Pancreas: Normal, with no mass or duct dilation. Spleen: Normal size. No mass. Adrenals/Urinary Tract: Normal adrenals. Normal kidneys with no hydronephrosis and no renal mass. Bladder obscured by streak artifact from right hip hardware. Bladder is nondistended and without gross abnormality. Stomach/Bowel: Normal non-distended stomach. Normal caliber small bowel with no small bowel wall thickening. Normal appendix. Mild left colonic diverticulosis. No large bowel wall thickening. Vascular/Lymphatic: Atherosclerotic nonaneurysmal abdominal aorta. Patent portal, splenic, hepatic and renal veins. No pathologically enlarged lymph nodes in the abdomen or pelvis. Reproductive: Status post hysterectomy, with no abnormal findings at the vaginal cuff. No adnexal mass. Other: No pneumoperitoneum. Moderate volume ascites with simple fluid density. No focal fluid collection. Mild anasarca. Musculoskeletal: No aggressive appearing focal osseous lesions. Right total hip arthroplasty. Mild superior T11 and T12, mild inferior L2 and mild superior L3 and L4 vertebral compression fractures of indeterminate chronicity. Mild thoracolumbar spondylosis. IMPRESSION: 1. Abnormal appearance of the liver with heterogeneous hepatic steatosis and heterogeneous liver enhancement. Questionable fine liver surface irregularity. Cannot exclude hepatic cirrhosis. No discrete liver masses. Recommend correlation with liver enzymes. 2. Third-spacing of fluid with moderate ascites, small dependent bilateral pleural effusions, right greater than left, and mild anasarca. 3. No evidence of bowel obstruction or acute bowel inflammation. Mild colonic diverticulosis, without convincing findings of acute  diverticulitis. 4. Patchy bandlike consolidation in the left lung base, cannot exclude pneumonia or aspiration. 5. Left lung base 4 mm solid pulmonary nodule. No follow-up needed if patient is low-risk. Non-contrast chest CT can be considered in 12 months if patient is high-risk. This recommendation follows the consensus statement: Guidelines for Management of Incidental Pulmonary Nodules Detected on CT Images:From the Fleischner Society 2017; published online before print (10.1148/radiol.[id number]). 6.  Aortic Atherosclerosis (ICD10-I70.0). Electronically Signed   By: Ilona Sorrel M.D.   On: 01/06/2019 17:58   Dg Chest Port 1 View  Result Date:  01/06/2019 CLINICAL DATA:  CT abdomen pelvis 01/06/2019. EXAM: PORTABLE CHEST 1 VIEW COMPARISON:  Same-day CT abdomen and pelvis, chest radiograph 07/05/2018. FINDINGS: Corresponding well to the CT abnormality is a patchy region of airspace disease in the left lung base. The 4 mm nodule seen on CT is not radiographically apparent. Small right pleural effusion is present. No pneumothorax. Patient is rotated left anterior oblique superimposing much the cardiac silhouette over the right chest. Cardiomediastinal contours are otherwise unremarkable. No acute osseous or soft tissue abnormality. IMPRESSION: Patchy airspace disease in left lung base could reflect early pneumonia or sequela of aspiration in the appropriate setting. Electronically Signed   By: Lovena Le M.D.   On: 01/06/2019 22:36    Pending Labs Unresulted Labs (From admission, onward)    Start     Ordered   01/13/19 0500  Creatinine, serum  (enoxaparin (LOVENOX)    CrCl < 30 ml/min)  Weekly,   R    Comments: while on enoxaparin therapy.    01/06/19 2209   01/07/19 0500  Comprehensive metabolic panel  Tomorrow morning,   R     01/06/19 2209   01/07/19 0500  CBC  Tomorrow morning,   R     01/06/19 2209   01/06/19 2211  Gamma GT  Add-on,   AD     01/06/19 2210   01/06/19 2208  HIV antibody  (Routine Testing)  Once,   STAT     01/06/19 2209   01/06/19 2040  Hepatitis panel, acute  ONCE - STAT,   STAT     01/06/19 2039          Vitals/Pain Today's Vitals   01/06/19 2200 01/06/19 2230 01/06/19 2300 01/06/19 2330  BP: (!) 110/57 (!) 101/55 (!) 109/58 (!) 106/59  Pulse: 64 63 63 62  Resp: (!) 23 (!) 21 16 19   Temp:      TempSrc:      SpO2: 97% 97% 97% 96%  Weight:      Height:      PainSc:        Isolation Precautions No active isolations  Medications Medications  sodium chloride 0.9 % bolus 500 mL (0 mLs Intravenous Stopped 01/06/19 2100)    Followed by  0.9 %  sodium chloride infusion (1,000 mLs Intravenous New Bag/Given 01/06/19 2013)  enoxaparin (LOVENOX) injection 30 mg (has no administration in time range)  feeding supplement (PRO-STAT SUGAR FREE 64) liquid 30 mL (has no administration in time range)    Mobility Wheel chair

## 2019-01-06 NOTE — ED Triage Notes (Signed)
Patient reports that she noticed bright red rectal bleeding last night when she had diarrhea. A few clots present.  Today, patient went to her GI physician and a CT Scan was ordered. Patient also c/o generalized abdominal pain.

## 2019-01-06 NOTE — ED Notes (Signed)
Pt unable to stand to get orthostatic VS.

## 2019-01-06 NOTE — Telephone Encounter (Signed)
Patient called office- having bright red blood in stool - has episodes of incontinence of bowels; currently in [place] radiology- has had 4-5 bowel movements today where blood was in toilet, on toilet paper, and in adult diaper; please advise if patient can be seen in office (tomorrow) or does she need to go to the emergency department?

## 2019-01-07 ENCOUNTER — Encounter (HOSPITAL_COMMUNITY): Payer: Self-pay | Admitting: Internal Medicine

## 2019-01-07 ENCOUNTER — Observation Stay (HOSPITAL_COMMUNITY): Payer: PPO

## 2019-01-07 DIAGNOSIS — J189 Pneumonia, unspecified organism: Secondary | ICD-10-CM | POA: Diagnosis present

## 2019-01-07 DIAGNOSIS — A0839 Other viral enteritis: Secondary | ICD-10-CM | POA: Diagnosis present

## 2019-01-07 DIAGNOSIS — K209 Esophagitis, unspecified: Secondary | ICD-10-CM | POA: Diagnosis not present

## 2019-01-07 DIAGNOSIS — Z96649 Presence of unspecified artificial hip joint: Secondary | ICD-10-CM | POA: Diagnosis present

## 2019-01-07 DIAGNOSIS — R945 Abnormal results of liver function studies: Secondary | ICD-10-CM | POA: Diagnosis not present

## 2019-01-07 DIAGNOSIS — J440 Chronic obstructive pulmonary disease with acute lower respiratory infection: Secondary | ICD-10-CM | POA: Diagnosis present

## 2019-01-07 DIAGNOSIS — E870 Hyperosmolality and hypernatremia: Secondary | ICD-10-CM | POA: Diagnosis not present

## 2019-01-07 DIAGNOSIS — N184 Chronic kidney disease, stage 4 (severe): Secondary | ICD-10-CM | POA: Diagnosis present

## 2019-01-07 DIAGNOSIS — Z7189 Other specified counseling: Secondary | ICD-10-CM | POA: Diagnosis not present

## 2019-01-07 DIAGNOSIS — G92 Toxic encephalopathy: Secondary | ICD-10-CM | POA: Diagnosis not present

## 2019-01-07 DIAGNOSIS — N179 Acute kidney failure, unspecified: Secondary | ICD-10-CM

## 2019-01-07 DIAGNOSIS — K766 Portal hypertension: Secondary | ICD-10-CM | POA: Diagnosis present

## 2019-01-07 DIAGNOSIS — K767 Hepatorenal syndrome: Secondary | ICD-10-CM | POA: Diagnosis present

## 2019-01-07 DIAGNOSIS — K8689 Other specified diseases of pancreas: Secondary | ICD-10-CM | POA: Diagnosis not present

## 2019-01-07 DIAGNOSIS — E872 Acidosis, unspecified: Secondary | ICD-10-CM | POA: Diagnosis present

## 2019-01-07 DIAGNOSIS — Z8639 Personal history of other endocrine, nutritional and metabolic disease: Secondary | ICD-10-CM

## 2019-01-07 DIAGNOSIS — R0602 Shortness of breath: Secondary | ICD-10-CM | POA: Diagnosis not present

## 2019-01-07 DIAGNOSIS — Z0189 Encounter for other specified special examinations: Secondary | ICD-10-CM | POA: Diagnosis not present

## 2019-01-07 DIAGNOSIS — Z66 Do not resuscitate: Secondary | ICD-10-CM | POA: Diagnosis not present

## 2019-01-07 DIAGNOSIS — Z8679 Personal history of other diseases of the circulatory system: Secondary | ICD-10-CM | POA: Diagnosis not present

## 2019-01-07 DIAGNOSIS — R197 Diarrhea, unspecified: Secondary | ICD-10-CM | POA: Diagnosis not present

## 2019-01-07 DIAGNOSIS — Z20828 Contact with and (suspected) exposure to other viral communicable diseases: Secondary | ICD-10-CM | POA: Diagnosis present

## 2019-01-07 DIAGNOSIS — R188 Other ascites: Secondary | ICD-10-CM | POA: Diagnosis present

## 2019-01-07 DIAGNOSIS — R630 Anorexia: Secondary | ICD-10-CM | POA: Diagnosis not present

## 2019-01-07 DIAGNOSIS — E7800 Pure hypercholesterolemia, unspecified: Secondary | ICD-10-CM | POA: Diagnosis present

## 2019-01-07 DIAGNOSIS — K269 Duodenal ulcer, unspecified as acute or chronic, without hemorrhage or perforation: Secondary | ICD-10-CM | POA: Diagnosis not present

## 2019-01-07 DIAGNOSIS — E039 Hypothyroidism, unspecified: Secondary | ICD-10-CM | POA: Diagnosis present

## 2019-01-07 DIAGNOSIS — K760 Fatty (change of) liver, not elsewhere classified: Secondary | ICD-10-CM | POA: Diagnosis not present

## 2019-01-07 DIAGNOSIS — R609 Edema, unspecified: Secondary | ICD-10-CM | POA: Diagnosis not present

## 2019-01-07 DIAGNOSIS — Z79890 Hormone replacement therapy (postmenopausal): Secondary | ICD-10-CM | POA: Diagnosis not present

## 2019-01-07 DIAGNOSIS — E43 Unspecified severe protein-calorie malnutrition: Secondary | ICD-10-CM

## 2019-01-07 DIAGNOSIS — I5031 Acute diastolic (congestive) heart failure: Secondary | ICD-10-CM | POA: Diagnosis not present

## 2019-01-07 DIAGNOSIS — R1310 Dysphagia, unspecified: Secondary | ICD-10-CM | POA: Diagnosis not present

## 2019-01-07 DIAGNOSIS — R911 Solitary pulmonary nodule: Secondary | ICD-10-CM | POA: Diagnosis present

## 2019-01-07 DIAGNOSIS — R601 Generalized edema: Secondary | ICD-10-CM | POA: Diagnosis not present

## 2019-01-07 DIAGNOSIS — K625 Hemorrhage of anus and rectum: Secondary | ICD-10-CM

## 2019-01-07 DIAGNOSIS — I129 Hypertensive chronic kidney disease with stage 1 through stage 4 chronic kidney disease, or unspecified chronic kidney disease: Secondary | ICD-10-CM | POA: Diagnosis present

## 2019-01-07 DIAGNOSIS — K264 Chronic or unspecified duodenal ulcer with hemorrhage: Secondary | ICD-10-CM | POA: Diagnosis present

## 2019-01-07 DIAGNOSIS — K909 Intestinal malabsorption, unspecified: Secondary | ICD-10-CM | POA: Diagnosis not present

## 2019-01-07 DIAGNOSIS — Z515 Encounter for palliative care: Secondary | ICD-10-CM | POA: Diagnosis not present

## 2019-01-07 DIAGNOSIS — Z8249 Family history of ischemic heart disease and other diseases of the circulatory system: Secondary | ICD-10-CM | POA: Diagnosis not present

## 2019-01-07 DIAGNOSIS — I776 Arteritis, unspecified: Secondary | ICD-10-CM | POA: Diagnosis not present

## 2019-01-07 DIAGNOSIS — K7460 Unspecified cirrhosis of liver: Secondary | ICD-10-CM | POA: Diagnosis not present

## 2019-01-07 DIAGNOSIS — M0579 Rheumatoid arthritis with rheumatoid factor of multiple sites without organ or systems involvement: Secondary | ICD-10-CM | POA: Diagnosis present

## 2019-01-07 DIAGNOSIS — N178 Other acute kidney failure: Secondary | ICD-10-CM | POA: Diagnosis not present

## 2019-01-07 DIAGNOSIS — Z833 Family history of diabetes mellitus: Secondary | ICD-10-CM | POA: Diagnosis not present

## 2019-01-07 DIAGNOSIS — J810 Acute pulmonary edema: Secondary | ICD-10-CM | POA: Diagnosis not present

## 2019-01-07 LAB — STOOL CULTURE
MICRO NUMBER:: 656141
MICRO NUMBER:: 656142
MICRO NUMBER:: 656143
SHIGA RESULT:: NOT DETECTED
SPECIMEN QUALITY:: ADEQUATE
SPECIMEN QUALITY:: ADEQUATE
SPECIMEN QUALITY:: ADEQUATE

## 2019-01-07 LAB — CBC
HCT: 34.5 % — ABNORMAL LOW (ref 36.0–46.0)
HCT: 31.7 % — ABNORMAL LOW (ref 36.0–46.0)
Hemoglobin: 10.6 g/dL — ABNORMAL LOW (ref 12.0–15.0)
Hemoglobin: 10.2 g/dL — ABNORMAL LOW (ref 12.0–15.0)
MCH: 30.6 pg (ref 26.0–34.0)
MCH: 31.2 pg (ref 26.0–34.0)
MCHC: 30.7 g/dL (ref 30.0–36.0)
MCHC: 32.2 g/dL (ref 30.0–36.0)
MCV: 99.7 fL (ref 80.0–100.0)
MCV: 96.9 fL (ref 80.0–100.0)
Platelets: 223 10*3/uL (ref 150–400)
Platelets: 232 10*3/uL (ref 150–400)
RBC: 3.46 MIL/uL — ABNORMAL LOW (ref 3.87–5.11)
RBC: 3.27 MIL/uL — ABNORMAL LOW (ref 3.87–5.11)
RDW: 18.2 % — ABNORMAL HIGH (ref 11.5–15.5)
RDW: 18 % — ABNORMAL HIGH (ref 11.5–15.5)
WBC: 10.7 10*3/uL — ABNORMAL HIGH (ref 4.0–10.5)
WBC: 11.1 10*3/uL — ABNORMAL HIGH (ref 4.0–10.5)
nRBC: 0.2 % (ref 0.0–0.2)
nRBC: 0 % (ref 0.0–0.2)

## 2019-01-07 LAB — GASTROINTESTINAL PATHOGEN PANEL PCR
C. difficile Tox A/B, PCR: NOT DETECTED
Campylobacter, PCR: NOT DETECTED
Cryptosporidium, PCR: NOT DETECTED
E coli (ETEC) LT/ST PCR: NOT DETECTED
E coli (STEC) stx1/stx2, PCR: NOT DETECTED
E coli 0157, PCR: NOT DETECTED
Giardia lamblia, PCR: NOT DETECTED
Norovirus, PCR: NOT DETECTED
Rotavirus A, PCR: NOT DETECTED
Salmonella, PCR: NOT DETECTED
Shigella, PCR: NOT DETECTED

## 2019-01-07 LAB — COMPREHENSIVE METABOLIC PANEL
ALT: 16 U/L (ref 0–44)
ALT: 16 U/L (ref 0–44)
AST: 80 U/L — ABNORMAL HIGH (ref 15–41)
AST: 72 U/L — ABNORMAL HIGH (ref 15–41)
Albumin: 2 g/dL — ABNORMAL LOW (ref 3.5–5.0)
Albumin: 1.9 g/dL — ABNORMAL LOW (ref 3.5–5.0)
Alkaline Phosphatase: 210 U/L — ABNORMAL HIGH (ref 38–126)
Alkaline Phosphatase: 198 U/L — ABNORMAL HIGH (ref 38–126)
Anion gap: 11 (ref 5–15)
Anion gap: 11 (ref 5–15)
BUN: 20 mg/dL (ref 8–23)
BUN: 22 mg/dL (ref 8–23)
CO2: 20 mmol/L — ABNORMAL LOW (ref 22–32)
CO2: 22 mmol/L (ref 22–32)
Calcium: 7.5 mg/dL — ABNORMAL LOW (ref 8.9–10.3)
Calcium: 7.4 mg/dL — ABNORMAL LOW (ref 8.9–10.3)
Chloride: 108 mmol/L (ref 98–111)
Chloride: 106 mmol/L (ref 98–111)
Creatinine, Ser: 1.63 mg/dL — ABNORMAL HIGH (ref 0.44–1.00)
Creatinine, Ser: 1.63 mg/dL — ABNORMAL HIGH (ref 0.44–1.00)
GFR calc Af Amer: 38 mL/min — ABNORMAL LOW (ref >60)
GFR calc Af Amer: 38 mL/min — ABNORMAL LOW (ref >60)
GFR calc non Af Amer: 32 mL/min — ABNORMAL LOW (ref >60)
GFR calc non Af Amer: 32 mL/min — ABNORMAL LOW (ref >60)
Glucose, Bld: 66 mg/dL — ABNORMAL LOW (ref 70–99)
Glucose, Bld: 106 mg/dL — ABNORMAL HIGH (ref 70–99)
Potassium: 4.5 mmol/L (ref 3.5–5.1)
Potassium: 3.6 mmol/L (ref 3.5–5.1)
Sodium: 139 mmol/L (ref 135–145)
Sodium: 139 mmol/L (ref 135–145)
Total Bilirubin: 2.3 mg/dL — ABNORMAL HIGH (ref 0.3–1.2)
Total Bilirubin: 2.1 mg/dL — ABNORMAL HIGH (ref 0.3–1.2)
Total Protein: 5.4 g/dL — ABNORMAL LOW (ref 6.5–8.1)
Total Protein: 4.9 g/dL — ABNORMAL LOW (ref 6.5–8.1)

## 2019-01-07 LAB — FECAL LACTOFERRIN, QUANT
Fecal Lactoferrin: POSITIVE — AB
MICRO NUMBER:: 654869
SPECIMEN QUALITY:: ADEQUATE

## 2019-01-07 LAB — IRON AND TIBC: Iron: 81 ug/dL (ref 28–170)

## 2019-01-07 LAB — GLUCOSE, CAPILLARY
Glucose-Capillary: 117 mg/dL — ABNORMAL HIGH (ref 70–99)
Glucose-Capillary: 120 mg/dL — ABNORMAL HIGH (ref 70–99)
Glucose-Capillary: 62 mg/dL — ABNORMAL LOW (ref 70–99)
Glucose-Capillary: 63 mg/dL — ABNORMAL LOW (ref 70–99)
Glucose-Capillary: 65 mg/dL — ABNORMAL LOW (ref 70–99)
Glucose-Capillary: 85 mg/dL (ref 70–99)
Glucose-Capillary: 97 mg/dL (ref 70–99)

## 2019-01-07 LAB — PREALBUMIN: Prealbumin: 5 mg/dL — ABNORMAL LOW (ref 18–38)

## 2019-01-07 LAB — TSH: TSH: 1.98 u[IU]/mL (ref 0.350–4.500)

## 2019-01-07 LAB — FECAL FAT, QUANTITATIVE

## 2019-01-07 LAB — PANCREATIC ELASTASE, FECAL: Pancreatic Elastase-1, Stool: 83 mcg/g — ABNORMAL LOW

## 2019-01-07 LAB — ABO/RH: ABO/RH(D): B POS

## 2019-01-07 LAB — HIV ANTIBODY (ROUTINE TESTING W REFLEX): HIV Screen 4th Generation wRfx: NONREACTIVE

## 2019-01-07 LAB — VITAMIN B12: Vitamin B-12: 1199 pg/mL — ABNORMAL HIGH (ref 180–914)

## 2019-01-07 LAB — GAMMA GT: GGT: 178 U/L — ABNORMAL HIGH (ref 7–50)

## 2019-01-07 LAB — SEDIMENTATION RATE: Sed Rate: 18 mm/hr (ref 0–22)

## 2019-01-07 LAB — FERRITIN: Ferritin: 498 ng/mL — ABNORMAL HIGH (ref 11–307)

## 2019-01-07 MED ORDER — CHOLESTYRAMINE LIGHT 4 G PO PACK
4.0000 g | PACK | Freq: Two times a day (BID) | ORAL | Status: DC | PRN
  Filled 2019-01-07: qty 1

## 2019-01-07 MED ORDER — OXYCODONE HCL 5 MG PO TABS
5.0000 mg | ORAL_TABLET | ORAL | Status: DC | PRN
  Administered 2019-01-07 – 2019-02-07 (×9): 5 mg via ORAL
  Filled 2019-01-07 (×11): qty 1

## 2019-01-07 MED ORDER — SODIUM BICARBONATE 650 MG PO TABS
650.0000 mg | ORAL_TABLET | Freq: Two times a day (BID) | ORAL | Status: DC
  Administered 2019-01-07 – 2019-01-30 (×34): 650 mg via ORAL
  Filled 2019-01-07 (×44): qty 1

## 2019-01-07 MED ORDER — ACETAMINOPHEN 500 MG PO TABS
500.0000 mg | ORAL_TABLET | Freq: Four times a day (QID) | ORAL | Status: DC | PRN
  Administered 2019-01-07 – 2019-02-04 (×4): 500 mg via ORAL
  Filled 2019-01-07 (×5): qty 1

## 2019-01-07 MED ORDER — ACETAMINOPHEN 325 MG PO TABS: 325.0000 mg | ORAL_TABLET | Freq: Once | ORAL | Status: DC

## 2019-01-07 MED ORDER — MORPHINE SULFATE (PF) 2 MG/ML IV SOLN
1.0000 mg | INTRAVENOUS | Status: DC | PRN
  Administered 2019-01-17 – 2019-01-23 (×3): 1 mg via INTRAVENOUS
  Filled 2019-01-07 (×3): qty 1

## 2019-01-07 MED ORDER — DEXTROSE 50 % IV SOLN
50.0000 mL | Freq: Once | INTRAVENOUS | Status: AC
  Administered 2019-01-07: 02:00:00 50 mL via INTRAVENOUS

## 2019-01-07 MED ORDER — ESCITALOPRAM OXALATE 10 MG PO TABS
10.0000 mg | ORAL_TABLET | Freq: Every day | ORAL | Status: DC
  Administered 2019-01-07 – 2019-02-07 (×32): 10 mg via ORAL
  Filled 2019-01-07 (×33): qty 1

## 2019-01-07 MED ORDER — ALBUTEROL SULFATE (2.5 MG/3ML) 0.083% IN NEBU: 2.5000 mg | INHALATION_SOLUTION | RESPIRATORY_TRACT | Status: DC | PRN

## 2019-01-07 MED ORDER — CALCIUM CARBONATE-VITAMIN D 500-200 MG-UNIT PO TABS
1.0000 | ORAL_TABLET | Freq: Two times a day (BID) | ORAL | Status: DC
  Administered 2019-01-07: 1 via ORAL
  Filled 2019-01-07 (×2): qty 1

## 2019-01-07 MED ORDER — ALBUTEROL SULFATE HFA 108 (90 BASE) MCG/ACT IN AERS: 1.0000 | INHALATION_SPRAY | RESPIRATORY_TRACT | Status: DC | PRN

## 2019-01-07 MED ORDER — CHOLECALCIFEROL 10 MCG (400 UNIT) PO TABS
200.0000 [IU] | ORAL_TABLET | Freq: Two times a day (BID) | ORAL | Status: DC
  Administered 2019-01-07 – 2019-02-07 (×38): 200 [IU] via ORAL
  Filled 2019-01-07 (×72): qty 1

## 2019-01-07 MED ORDER — LEVOTHYROXINE SODIUM 88 MCG PO TABS
88.0000 ug | ORAL_TABLET | Freq: Every day | ORAL | Status: DC
  Administered 2019-01-07 – 2019-02-09 (×32): 88 ug via ORAL
  Filled 2019-01-07 (×32): qty 1

## 2019-01-07 MED ORDER — DICLOFENAC SODIUM 1 % TD GEL
4.0000 g | Freq: Three times a day (TID) | TRANSDERMAL | Status: DC | PRN
  Administered 2019-02-03: 4 g via TOPICAL
  Filled 2019-01-07: qty 100

## 2019-01-07 MED ORDER — METOPROLOL SUCCINATE ER 100 MG PO TB24
100.0000 mg | ORAL_TABLET | Freq: Every day | ORAL | Status: DC
  Administered 2019-01-07: 100 mg via ORAL
  Filled 2019-01-07: qty 1

## 2019-01-07 MED ORDER — ACETAMINOPHEN 500 MG PO TABS
500.0000 mg | ORAL_TABLET | Freq: Once | ORAL | Status: AC
  Administered 2019-01-07: 500 mg via ORAL
  Filled 2019-01-07: qty 1

## 2019-01-07 MED ORDER — ONDANSETRON HCL 4 MG/2ML IJ SOLN: 4.0000 mg | Freq: Four times a day (QID) | INTRAMUSCULAR | Status: DC | PRN

## 2019-01-07 MED ORDER — SODIUM BICARBONATE 8.4 % IV SOLN
INTRAVENOUS | Status: DC
  Administered 2019-01-07 (×2): via INTRAVENOUS
  Filled 2019-01-07 (×2): qty 150

## 2019-01-07 MED ORDER — CALCIUM CARBONATE ANTACID 500 MG PO CHEW
500.0000 mg | CHEWABLE_TABLET | Freq: Two times a day (BID) | ORAL | Status: DC
  Administered 2019-01-07 – 2019-02-05 (×40): 500 mg via ORAL
  Filled 2019-01-07 (×59): qty 3

## 2019-01-07 MED ORDER — SODIUM CHLORIDE 0.9 % IV SOLN
2.0000 g | INTRAVENOUS | Status: AC
  Administered 2019-01-07 – 2019-01-11 (×5): 2 g via INTRAVENOUS
  Filled 2019-01-07 (×3): qty 2
  Filled 2019-01-07: qty 20
  Filled 2019-01-07: qty 2

## 2019-01-07 MED ORDER — DEXTROSE 50 % IV SOLN
INTRAVENOUS | Status: AC
  Administered 2019-01-07: 50 mL via INTRAVENOUS
  Filled 2019-01-07: qty 50

## 2019-01-07 MED ORDER — AMLODIPINE BESYLATE 5 MG PO TABS
2.5000 mg | ORAL_TABLET | Freq: Every day | ORAL | Status: DC
  Administered 2019-01-07 (×2): 2.5 mg via ORAL
  Filled 2019-01-07 (×2): qty 1

## 2019-01-07 MED ORDER — SODIUM CHLORIDE 0.9 % IV SOLN
500.0000 mg | INTRAVENOUS | Status: AC
  Administered 2019-01-07 – 2019-01-11 (×5): 500 mg via INTRAVENOUS
  Filled 2019-01-07 (×5): qty 500

## 2019-01-07 MED ORDER — POTASSIUM CHLORIDE CRYS ER 10 MEQ PO TBCR
10.0000 meq | EXTENDED_RELEASE_TABLET | Freq: Every day | ORAL | Status: DC
  Administered 2019-01-07 – 2019-01-16 (×9): 10 meq via ORAL
  Filled 2019-01-07 (×10): qty 1

## 2019-01-07 NOTE — H&P (Addendum)
TRH H&P    Patient Demographics:    Kaylee Rasmussen, is a 68 y.o. female  MRN: [id number]  DOB - 1950-08-09  Admit Date - 01/06/2019  Referring MD/NP/PA: Marye Round  Outpatient Primary MD for the patient is Nicoletta Dress, MD Carolin Sicks -nephrology [place] - rheumatology Lyndel Safe -GI Patient coming from:  home  Chief complaint-   Blood in stool, Diarrhea,    HPI:    Kaylee Rasmussen  is a 68 y.o. 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 Bx-CMV colitis.  No malignancy.  Records scanned in "MEDIA TAB".  EGD showed LA grade D reflux esophagitis, gastritis. -Colonoscopy 08/12/2017-small colonic polyp, moderate colonic diverticulosis  Reviewed CT scan Abd/ pelvis  IMPRESSION: 1. Abnormal appearance of the liver with heterogeneous hepatic steatosis and heterogeneous liver enhancement. Questionable fine liver surface irregularity. Cannot exclude hepatic cirrhosis. No discrete liver masses. Recommend correlation with liver enzymes. 2. Third-spacing of fluid with moderate ascites, small dependent bilateral pleural effusions, right greater than left, and mild anasarca. 3. No evidence of bowel obstruction or acute bowel inflammation. Mild colonic diverticulosis, without convincing findings of acute diverticulitis. 4. Patchy bandlike consolidation in the  left lung base, cannot exclude pneumonia or aspiration. 5. Left lung base 4 mm solid pulmonary nodule. No follow-up needed if patient is low-risk. Non-contrast chest CT can be considered in 12 months if patient is high-risk.    In ED,  T 98.5  P 67 R 16  Bp 80/58  Pox 97% on RA Wt 63.5 kg  Current BP 121/58  Na 138, K 4.9,  Bun 21, Creatinine 1.66 Hco3 16, AG 15 Alb 2.2 Ast 96, Alt 20, Alk phos 258, T. Bili 2.6  Wbc 10.9, hgb 10.5, Plt 225 Type and screen done  FOBT negative  covid -19 negative  Pt will be admitted for c/o rectal bleeding , and chronic diarrhea, and non-AG acidosis secondary to diarrhea, and abnormal liver function.         Review of systems:    In addition to the HPI above,  No Fever-chills, No Headache, No changes with Vision or hearing, No problems swallowing food or Liquids, No Chest pain, Cough or Shortness of Breath, No Abdominal pain, No Blood in Urine, No dysuria, No new skin rashes or bruises, No new joints pains-aches,  No new weakness, tingling, numbness in any extremity, No recent weight gain or loss, No polyuria, polydypsia or polyphagia, No significant Mental Stressors.  All other systems reviewed and are negative.    Past History of the following :    Past Medical History:  Diagnosis Date   Asthma    High cholesterol    Hypertension    Rheumatoid arthritis ([place])    Vertigo       Past Surgical History:  Procedure Laterality Date   ABDOMINAL HYSTERECTOMY     HIP ARTHROPLASTY     Lumbarectomy        Social History:      Social History   Tobacco Use   Smoking status: Never Smoker   Smokeless tobacco: Never Used  Substance Use Topics   Alcohol use: No       Family History :     Family History  Problem Relation Age of Onset   Diabetes Mother    Heart attack Mother    Heart attack Father    Breast cancer Maternal Aunt    Colon cancer Neg Hx    Esophageal cancer Neg Hx         Home Medications:   Prior to Admission medications   Medication Sig Start Date End Date Taking? Authorizing Provider  amLODipine (NORVASC) 5 MG tablet Take 2.5 mg by mouth at bedtime.   Yes [provider]  Calcium Carbonate-Vitamin D (CALCIUM 600+D PO) Take 1 tablet by mouth 2 (two) times daily.   Yes [provider]  escitalopram (LEXAPRO) 10 MG tablet Take 10 mg by mouth daily. 03/23/17  Yes [provider]  levothyroxine (SYNTHROID, LEVOTHROID) 88 MCG tablet Take 88 mcg by mouth daily before breakfast.  03/12/17  Yes [provider]  metoprolol succinate (TOPROL-XL) 100 MG 24 hr tablet Take 100 mg by mouth daily. 03/24/17  Yes [provider]  potassium chloride (MICRO-K) 10 MEQ CR capsule Take 10 mEq by mouth daily.   Yes [provider]  PROAIR HFA 108 (90 Base) MCG/ACT inhaler Inhale 1-2 puffs into the lungs every 4 (four) hours as needed for wheezing.  05/05/16  Yes [provider]  SODIUM BICARBONATE PO Take by mouth 2 (two) times daily. 10 gains twice  daily   Yes [provider]  dicyclomine (BENTYL) 10 MG capsule Take 1 capsule (10 mg total) by mouth 2 (two) times a day. Patient not taking: Reported on 01/06/2019 12/17/18   Jackquline Denmark, MD  metoCLOPramide (REGLAN) 5 MG tablet Take 1 tablet (5 mg total) by mouth 2 (two) times a day for 14 days. half an hour before meals x 2 weeks for now 01/05/19 01/19/19  Jackquline Denmark, MD  ondansetron (ZOFRAN ODT) 4 MG disintegrating tablet Take 1 tablet (4 mg total) by mouth every 6 (six) hours as needed for nausea or vomiting. Patient not taking: Reported on 11/17/2018 10/28/18   Jackquline Denmark, MD  prochlorperazine (COMPAZINE) 10 MG tablet Take 1 tablet (10 mg total) by mouth 2 (two) times a day. Patient not taking: Reported on 01/06/2019 12/15/18   Jackquline Denmark, MD     Allergies:     Allergies  Allergen Reactions   Cozaar [Losartan Potassium]    Scallops [Shellfish Allergy]  Rash     Physical Exam:   Vitals  Blood pressure (!) 121/58, pulse 65, temperature 98.8 F (37.1 C), temperature source Oral, resp. rate 16, height '5\' 1"'$  (1.549 m), weight 63.5 kg, SpO2 97 %.  1.  General: axoxo3  2. Psychiatric: euthymic  3. Neurologic: cn2-12 intact, reflexes 2+ symmetric, diffuse with no clonus, motor 5/5 in all 4 ext,   4. HEENMT:  Anicteric, pupils 1.58m symmetric, direct, consensual intact Mucous membranes slightly dry Neck: no jvd, no  bruit, no tm  5. Respiratory : CTAB  6. Cardiovascular : rrr s1, s2, no m/g/r  7. Gastrointestinal:  Abd: soft, nt, nd, +bs  8. Skin:  Ext: no c/c/e, no rash  9.Musculoskeletal:  Good ROM  Slight synovitis of the mcp joints.     Data Review:    CBC Recent Labs  Lab 01/06/19 1759  WBC 10.9*  HGB 10.5*  HCT 33.0*  PLT 225  MCV 97.6  MCH 31.1  MCHC 31.8  RDW 18.0*   ------------------------------------------------------------------------------------------------------------------  Results for orders placed or performed during the hospital encounter of 01/06/19 (from the past 48 hour(s))  Comprehensive metabolic panel     Status: Abnormal   Collection Time: 01/06/19  5:44 PM  Result Value Ref Range   Sodium 138 135 - 145 mmol/L   Potassium 4.9 3.5 - 5.1 mmol/L   Chloride 107 98 - 111 mmol/L   CO2 16 (L) 22 - 32 mmol/L   Glucose, Bld 90 70 - 99 mg/dL   BUN 21 8 - 23 mg/dL   Creatinine, Ser 1.66 (H) 0.44 - 1.00 mg/dL   Calcium 7.9 (L) 8.9 - 10.3 mg/dL   Total Protein 5.8 (L) 6.5 - 8.1 g/dL   Albumin 2.2 (L) 3.5 - 5.0 g/dL   AST 96 (H) 15 - 41 U/L   ALT 20 0 - 44 U/L   Alkaline Phosphatase 258 (H) 38 - 126 U/L   Total Bilirubin 2.6 (H) 0.3 - 1.2 mg/dL   GFR calc non Af Amer 32 (L) >60 mL/min   GFR calc Af Amer 37 (L) >60 mL/min   Anion gap 15 5 - 15    Comment: Performed at [facility], [place] [address] [place], [place] [address]  CBC     Status: Abnormal   Collection  Time: 01/06/19  5:59 PM  Result Value Ref Range   WBC 10.9 (H) 4.0 - 10.5 K/uL   RBC 3.38 (L) 3.87 - 5.11 MIL/uL   Hemoglobin 10.5 (L) 12.0 - 15.0 g/dL   HCT 33.0 (L) 36.0 - 46.0 %   MCV 97.6 80.0 - 100.0 fL   MCH 31.1 26.0 - 34.0 pg   MCHC 31.8 30.0 - 36.0 g/dL   RDW 18.0 (H) 11.5 - 15.5 %   Platelets 225 150 - 400 K/uL   nRBC 0.0 0.0 - 0.2 %    Comment: Performed at [facility], [place] [address] [place], [place] [address]  POC occult blood, ED     Status: None   Collection Time: 01/06/19  6:52 PM  Result Value Ref Range   Fecal Occult Bld NEGATIVE NEGATIVE  Type and screen [place]     Status: None   Collection Time: 01/06/19  7:20 PM  Result Value Ref Range   ABO/RH(D) B POS    Antibody Screen NEG    Sample Expiration      01/09/2019,2359 Performed at [facility], [place] [address] [place], [place] [address]   ABO/Rh     Status: None (Preliminary result)   Collection Time: 01/06/19  7:20 PM  Result Value Ref Range   ABO/RH(D)      B POS Performed at [facility], [place] [address] [place], [place] [address]   SARS Coronavirus 2 (CEPHEID - Performed in [facility] hospital lab), Hosp Order     Status: None   Collection Time: 01/06/19  9:23 PM   Specimen: Nasopharyngeal Swab  Result Value Ref Range   SARS Coronavirus 2 NEGATIVE NEGATIVE    Comment: (NOTE) If result is NEGATIVE SARS-CoV-2 target nucleic acids are NOT DETECTED. The SARS-CoV-2 RNA is generally detectable in upper and lower  respiratory specimens during the acute phase of infection. The lowest  concentration of SARS-CoV-2 viral copies this assay can detect is 250  copies / mL. A negative result does not preclude SARS-CoV-2 infection  and should not be used as the sole basis for treatment or other  patient management decisions.  A negative result may occur with  improper specimen collection / handling, submission of specimen other    than nasopharyngeal swab, presence of viral mutation(s) within the  areas targeted by this assay, and inadequate number of viral copies  (<250 copies / mL). A negative result must be combined with clinical  observations, patient history, and epidemiological information. If result is POSITIVE SARS-CoV-2 target nucleic acids are DETECTED. The SARS-CoV-2 RNA is generally detectable in upper and lower  respiratory specimens dur ing the acute phase of infection.  Positive  results are indicative of active infection with SARS-CoV-2.  Clinical  correlation with patient history and other diagnostic information is  necessary to determine patient infection status.  Positive results do  not rule out bacterial infection or co-infection with other viruses. If result is PRESUMPTIVE POSTIVE SARS-CoV-2 nucleic acids MAY BE PRESENT.   A presumptive positive result was obtained on the submitted specimen  and confirmed on repeat testing.  While 2019 novel coronavirus  (SARS-CoV-2) nucleic acids may be present in the submitted sample  additional confirmatory testing may be necessary for epidemiological  and / or clinical management purposes  to differentiate between  SARS-CoV-2 and other Sarbecovirus currently known to infect humans.  If clinically indicated additional testing with an alternate test  methodology [phone]) is advised. The SARS-CoV-2 RNA is generally  detectable in upper and lower respiratory sp ecimens during the acute  phase of infection. The expected result is Negative. Fact Sheet for Patients:  StrictlyIdeas.no Fact Sheet for Healthcare Providers: BankingDealers.co.za This test is not yet approved or cleared by the [place] FDA and has been authorized for detection and/or diagnosis of SARS-CoV-2 by FDA under an Emergency Use Authorization (EUA).  This EUA will remain in effect (meaning this test can be used) for the duration of  the COVID-19 declaration under Section 564(b)(1) of the Act, 21 U.S.C. section 360bbb-3(b)(1), unless the authorization is terminated or revoked sooner. Performed at [facility], [place] [address], [place], [place] [address]     Chemistries  Recent Labs  Lab 01/06/19 1744  NA 138  K 4.9  CL 107  CO2 16*  GLUCOSE 90  BUN 21  CREATININE 1.66*  CALCIUM 7.9*  AST 96*  ALT 20  ALKPHOS 258*  BILITOT 2.6*   ------------------------------------------------------------------------------------------------------------------  ------------------------------------------------------------------------------------------------------------------ GFR: Estimated Creatinine Clearance: 28.5 mL/min (A) (by C-G formula based on SCr of 1.66 mg/dL (H)). Liver Function Tests: Recent Labs  Lab 01/06/19 1744  AST 96*  ALT 20  ALKPHOS 258*  BILITOT 2.6*  PROT 5.8*  ALBUMIN 2.2*   No results for input(s): LIPASE, AMYLASE in the last 168 hours. No results for input(s): AMMONIA in the last 168 hours. Coagulation Profile: No results for input(s): INR, PROTIME in the last 168 hours. Cardiac Enzymes: No results for input(s): CKTOTAL, CKMB, CKMBINDEX, TROPONINI in the last 168 hours. BNP (last 3 results) No results for input(s): PROBNP in the last 8760 hours. HbA1C:  No results for input(s): HGBA1C in the last 72 hours. CBG: No results for input(s): GLUCAP in the last 168 hours. Lipid Profile: No results for input(s): CHOL, HDL, LDLCALC, TRIG, CHOLHDL, LDLDIRECT in the last 72 hours. Thyroid Function Tests: No results for input(s): TSH, T4TOTAL, FREET4, T3FREE, THYROIDAB in the last 72 hours. Anemia Panel: No results for input(s): VITAMINB12, FOLATE, FERRITIN, TIBC, IRON, RETICCTPCT in the last 72 hours.  --------------------------------------------------------------------------------------------------------------- Urine analysis: No results found for: COLORURINE,  APPEARANCEUR, LABSPEC, PHURINE, GLUCOSEU, HGBUR, BILIRUBINUR, KETONESUR, PROTEINUR, UROBILINOGEN, NITRITE, LEUKOCYTESUR    Imaging Results:    Ct Abdomen Pelvis W Contrast  Result Date: 01/06/2019 CLINICAL DATA:  Abdominal pain, nausea, vomiting and bloating. EXAM: CT ABDOMEN AND PELVIS WITH CONTRAST TECHNIQUE: Multidetector CT imaging of the abdomen and pelvis was performed using the standard protocol following bolus administration of intravenous contrast. CONTRAST:  60m OMNIPAQUE IOHEXOL 300 MG/ML  SOLN COMPARISON:  04/24/2016 abdominal sonogram. FINDINGS: Lower chest: Small dependent bilateral pleural effusions, right greater than left. Left lower lobe 4 mm solid pulmonary nodule (series 3/image 7). Patchy bandlike consolidation in the left lower lobe. Coronary atherosclerosis. Hepatobiliary: Heterogeneous diffuse hepatic steatosis. Markedly heterogeneous liver parenchymal enhancement. No discrete liver mass. Questionable fine liver surface irregularity. Distended gallbladder. No gallbladder wall thickening. No radiopaque cholelithiasis. No biliary ductal dilatation. CBD diameter 5 mm. Pancreas: Normal, with no mass or duct dilation. Spleen: Normal size. No mass. Adrenals/Urinary Tract: Normal adrenals. Normal kidneys with no hydronephrosis and no renal mass. Bladder obscured by streak artifact from right hip hardware. Bladder is nondistended and without gross abnormality. Stomach/Bowel: Normal non-distended stomach. Normal caliber small bowel with no small bowel wall thickening. Normal appendix. Mild left colonic diverticulosis. No large bowel wall thickening. Vascular/Lymphatic: Atherosclerotic nonaneurysmal abdominal aorta. Patent portal, splenic, hepatic and renal veins. No pathologically enlarged lymph nodes in the abdomen or pelvis. Reproductive: Status post hysterectomy, with no abnormal findings at the vaginal cuff. No adnexal mass. Other: No pneumoperitoneum. Moderate volume ascites with simple  fluid density. No focal fluid collection. Mild anasarca. Musculoskeletal: No aggressive appearing focal osseous lesions. Right total hip arthroplasty. Mild superior T11 and T12, mild inferior L2 and mild superior L3 and L4 vertebral compression fractures of indeterminate chronicity. Mild thoracolumbar spondylosis. IMPRESSION: 1. Abnormal appearance of the liver with heterogeneous hepatic steatosis and heterogeneous liver enhancement. Questionable fine liver surface irregularity. Cannot exclude hepatic cirrhosis. No discrete liver masses. Recommend correlation with liver enzymes. 2. Third-spacing of fluid with moderate ascites, small dependent bilateral pleural effusions, right greater than left, and mild anasarca. 3. No evidence of bowel obstruction or acute bowel inflammation. Mild colonic diverticulosis, without convincing findings of acute diverticulitis. 4. Patchy bandlike consolidation in the left lung base, cannot exclude pneumonia or aspiration. 5. Left lung base 4 mm solid pulmonary nodule. No follow-up needed if patient is low-risk. Non-contrast chest CT can be considered in 12 months if patient is high-risk. This recommendation follows the consensus statement: Guidelines for Management of Incidental Pulmonary Nodules Detected on CT Images:From the Fleischner Society 2017; published online before print (10.1148/[id number]. 6.  Aortic Atherosclerosis (ICD10-I70.0). Electronically Signed   By: JIlona SorrelM.D.   On: 01/06/2019 17:58   Dg Chest Port 1 View  Result Date: 01/06/2019 CLINICAL DATA:  CT abdomen pelvis 01/06/2019. EXAM: PORTABLE CHEST 1 VIEW COMPARISON:  Same-day CT abdomen and pelvis, chest radiograph 07/05/2018. FINDINGS: Corresponding well to the CT abnormality is a patchy region of airspace disease in the left lung base. The 4 mm nodule  seen on CT is not radiographically apparent. Small right pleural effusion is present. No pneumothorax. Patient is rotated left anterior oblique  superimposing much the cardiac silhouette over the right chest. Cardiomediastinal contours are otherwise unremarkable. No acute osseous or soft tissue abnormality. IMPRESSION: Patchy airspace disease in left lung base could reflect early pneumonia or sequela of aspiration in the appropriate setting. Electronically Signed   By: Lovena Le M.D.   On: 01/06/2019 22:36       Assessment & Plan:    Principal Problem:   ARF (acute renal failure) (HCC) Active Problems:   Rheumatoid arthritis involving multiple sites with positive rheumatoid factor (HCC)   History of hypertension   History of hypothyroidism   Abnormal liver function   Diarrhea   Pulmonary nodule   Metabolic acidosis  Blood in stool NPO  Type and screen Check cbc in am Please contact [facility] in am for consult  Abnormal liver function  Check GGT , check cpk Check ferritin, iron, tibc, ceruloplasmin, alpha 1 antitripsin, ana, antismooth muscle ab, check anti mitochondrial antibody Check MRCP, consider ERCP, defer to GI  Chronic diarrhea Check celiac panel Check stool GI pathogen panel Check stool C. Diff Cholestyramine prn  Non- AG acidosis d5w w bicarb at 73m per hour Check cmp in am  N/v Zofran '4mg'$  iv q6h prn   Hypertension Cont Amlodipine 2.'5mg'$  po qday Cont Toprol XL '100mg'$  po qday  Hypothyroidism Cont Levothyroxine 88 micrograms po qday  Anxiety Cont Lexapro '10mg'$  po qday  Anemia Check cbc in am  RA Off Humira, follows with [facility] Voltaren gel prn   DVT Prophylaxis-   Lovenox - SCDs  AM Labs Ordered, also please review Full Orders  Family Communication: Admission, patients condition and plan of care including tests being ordered have been discussed with the patient  who indicate understanding and agree with the plan and Code Status.  Code Status:  FULL CODE  Admission status: Observation: Based on patients clinical presentation and evaluation of above clinical data, I have made  determination that patient meets observation criteria at this time.   Time spent in minutes : 70   JJani GravelM.D on 01/07/2019 at 12:35 AM

## 2019-01-07 NOTE — Consult Note (Addendum)
Consultation  Referring Provider: [place] Primary Care Physician:  Nicoletta Dress, MD Primary Gastroenterologist:  Dr.Gupta  Reason for Consultation: Abdominal pain, cramping and persistent diarrhea x6 months with associated weight loss, and now new rectal bleeding  HPI: Kaylee Rasmussen is a 68 y.o. female established with Dr. Lyndel Safe.  She has a somewhat complicated past history.  She has underlying rheumatoid arthritis, for which she has been on methotrexate for many years.  She has not been on methotrexate in the past couple of years.  She had been on Humira but that was stopped at some point during 2019.  Also with asthma, hypertension, hyperlipidemia and history of necrotizing glomerulonephritis. [facility] an [facility] in [facility] February 2020 with acute illness and severe diarrhea.  She underwent EGD and colonoscopy.  EGD showed grade D esophagitis and path was consistent with HSV.  She also had colonoscopy at that same time with finding of a 3 cm ulcerated masslike lesion at the ileocecal valve and scattered satellite ulcers.  Path positive for CMV.  She was treated for both infections.  She says she has never really had resolution of diarrhea since.  Says she will go for 3 to 4 days and have fairly normal bowel movements and then have onset again with diarrhea.  Over the past 2 to 3 months she has had more persistent diarrhea with 5-6 loose stools per day.  She has not seen any blood until 2 days ago when she had 2 episodes of more grossly bloody stool.  Yesterday she had stools without obvious blood and then this morning had blood mixed in with her bowel movement again. No documented fever or chills.  She had lost at least 30 pounds this year, but with Dr. Steve Rattler encouragement to follow a high calorie diet she has managed to gain 13 pounds back.  She has no current complaints of dysphagia or odynophagia, does occasionally have belching and indigestion. Stool  studies have been done as an outpatient earlier in July including regular stool culture, GI path panel and fecal elastase all unremarkable.  Stool for lactoferrin positive. She is also been noted to have elevated LFTs and a persistent anemia.  Labs in March 2020 showed hemoglobin 9.4 hematocrit of 24.3, T bili 0.5/alk phos 849/AST of 119.  Patient had called Dr. Lyndel Safe yesterday and as she was having continued failure to thrive and persistent diarrhea, now with rectal bleeding, was advised to come to the emergency room.    Labs on admit WBC 10.9, hemoglobin 10.5 creatinine 1.6, T bili 2.6/alk phos 258/AST of 96  CT of the abdomen and pelvis was done yesterday shows small bilateral effusions right greater than left, there are some consolidation in the left lung cannot rule out left upper lobe pneumonia.  There is no small bowel or colonic inflammation evident, she does have mild colonic diverticulosis on the left.  Liver is noted to be somewhat irregular and with diffuse hepatic steatosis cannot rule out cirrhosis.  She also has moderate amount of ascites.  Patient is not aware of any prior diagnosis of underlying liver disease.  She had been told by her PCP this spring that her LFTs have been elevated but were improved.  No family history of hereditary liver disease.  Multiple autoimmune and chronic hepatic markers have been ordered, including acute hepatitis panel.  Patient had also had a screening colonoscopy with Dr. Lyndel Safe in February 2019, found to have 1 small polyp and  left-sided diverticulosis.     Past Medical History:  Diagnosis Date  . Asthma   . COPD (chronic obstructive pulmonary disease) ([place]) 07/13/2018   on CT chest at [facility]  . Esophagitis, CMV (cytomegalovirus) ([place]) 07/08/2018  . Herpes simplex esophagitis 07/08/2018  . High cholesterol   . Hypertension   . Pancytopenia ([place])    secondary to cyclophosphamide  . Rheumatoid arthritis ([place])   . Vertigo     Past  Surgical History:  Procedure Laterality Date  . ABDOMINAL HYSTERECTOMY    . HIP ARTHROPLASTY    . Lumbarectomy    . RENAL BIOPSY  03/24/2018    Prior to Admission medications   Medication Sig Start Date End Date Taking? Authorizing Provider  amLODipine (NORVASC) 5 MG tablet Take 2.5 mg by mouth at bedtime.   Yes [provider]  Calcium Carbonate-Vitamin D (CALCIUM 600+D PO) Take 1 tablet by mouth 2 (two) times daily.   Yes [provider]  escitalopram (LEXAPRO) 10 MG tablet Take 10 mg by mouth daily. 03/23/17  Yes [provider]  levothyroxine (SYNTHROID, LEVOTHROID) 88 MCG tablet Take 88 mcg by mouth daily before breakfast.  03/12/17  Yes [provider]  metoprolol succinate (TOPROL-XL) 100 MG 24 hr tablet Take 100 mg by mouth daily. 03/24/17  Yes [provider]  potassium chloride (MICRO-K) 10 MEQ CR capsule Take 10 mEq by mouth daily.   Yes [provider]  PROAIR HFA 108 (90 Base) MCG/ACT inhaler Inhale 1-2 puffs into the lungs every 4 (four) hours as needed for wheezing.  05/05/16  Yes [provider]  SODIUM BICARBONATE PO Take by mouth 2 (two) times daily. 10 gains twice  daily   Yes [provider]  dicyclomine (BENTYL) 10 MG capsule Take 1 capsule (10 mg total) by mouth 2 (two) times a day. Patient not taking: Reported on 01/06/2019 12/17/18   Jackquline Denmark, MD  metoCLOPramide (REGLAN) 5 MG tablet Take 1 tablet (5 mg total) by mouth 2 (two) times a day for 14 days. half an hour before meals x 2 weeks for now 01/05/19 01/19/19  Jackquline Denmark, MD  ondansetron (ZOFRAN ODT) 4 MG disintegrating tablet Take 1 tablet (4 mg total) by mouth every 6 (six) hours as needed for nausea or vomiting. Patient not taking: Reported on 11/17/2018 10/28/18   Jackquline Denmark, MD  prochlorperazine (COMPAZINE) 10 MG tablet Take 1 tablet (10 mg total) by mouth 2 (two) times a day. Patient not taking: Reported on 01/06/2019 12/15/18   Jackquline Denmark, MD    Current Facility-Administered Medications  Medication Dose Route Frequency Provider Last Rate Last Dose  . albuterol (PROVENTIL) (2.5 MG/3ML) 0.083% nebulizer solution 2.5 mg  2.5 mg Nebulization Q4H PRN Jani Gravel, MD      . amLODipine (NORVASC) tablet 2.5 mg  2.5 mg Oral QHS Jani Gravel, MD   2.5 mg at 01/07/19 0208  . calcium-vitamin D (OSCAL WITH D) 500-200 MG-UNIT per tablet 1 tablet  1 tablet Oral BID Jani Gravel, MD   1 tablet at 01/07/19 0208  . cholestyramine light (PREVALITE) packet 4 g  4 g Oral BID PRN Jani Gravel, MD      . diclofenac sodium (VOLTAREN) 1 % transdermal gel 4 g  4 g Topical TID PRN Jani Gravel, MD      . enoxaparin (LOVENOX) injection 30 mg  30 mg Subcutaneous QHS Jani Gravel, MD   30 mg at 01/07/19 0056  . escitalopram (LEXAPRO) tablet  10 mg  10 mg Oral Daily Jani Gravel, MD      . feeding supplement (PRO-STAT SUGAR FREE 64) liquid 30 mL  30 mL Oral BID Jani Gravel, MD   30 mL at 01/07/19 0056  . levothyroxine (SYNTHROID) tablet 88 mcg  88 mcg Oral Q0600 Jani Gravel, MD   88 mcg at 01/07/19 0534  . metoprolol succinate (TOPROL-XL) 24 hr tablet 100 mg  100 mg Oral Daily Jani Gravel, MD      . morphine 2 MG/ML injection 1 mg  1 mg Intravenous Q4H PRN Elodia Florence., MD      . ondansetron [facility]) injection 4 mg  4 mg Intravenous Q6H PRN Jani Gravel, MD      . oxyCODONE (Oxy IR/ROXICODONE) immediate release tablet 5 mg  5 mg Oral Q4H PRN Elodia Florence., MD   5 mg at 01/07/19 [phone]  . potassium chloride (K-DUR) CR tablet 10 mEq  10 mEq Oral Daily Jani Gravel, MD      . sodium bicarbonate 150 mEq in dextrose 5 % 1,000 mL infusion   Intravenous Continuous Jani Gravel, MD 75 mL/hr at 01/07/19 0202    . sodium bicarbonate tablet 650 mg  650 mg Oral BID Jani Gravel, MD   650 mg at 01/07/19 7207    Allergies as of 01/06/2019 - Review Complete 01/06/2019  Allergen Reaction Noted  . Cozaar [losartan potassium]  11/17/2018  . Scallops [shellfish allergy] Rash  05/26/2016    Family History  Problem Relation Age of Onset  . Diabetes Mother   . Heart attack Mother   . Heart attack Father   . Breast cancer Maternal Aunt   . Colon cancer Neg Hx   . Esophageal cancer Neg Hx     Social History   Social History Narrative   Married   Lives in [place]   Never smoker, no EtoH/drugs     Review of Systems: Pertinent positive and negative review of systems were noted in the above HPI section.  All other review of systems was otherwise negative. Endo:  Denies any problems with DM, thyroid, adrenal function.  Physical Exam: Vital signs in last 24 hours: Temp:  [98 F (36.7 C)-98.8 F (37.1 C)] 98.8 F (37.1 C) (07/17 0555) Pulse Rate:  [61-73] 73 (07/17 0555) Resp:  [16-23] 16 (07/17 0555) BP: (80-134)/(35-65) 97/59 (07/17 0555) SpO2:  [94 %-100 %] 94 % (07/17 0555) Weight:  [63.5 kg-68.1 kg] 68.1 kg (07/17 0500) Last BM Date: 01/07/19 General:   Alert,  Well-developed, chronically ill-appearing older white female , pleasant and cooperative in NAD Head:  Normocephalic and atraumatic.  Hair very sparse Eyes:  Sclera clear, no icterus.   Conjunctiva pink. Ears:  Normal auditory acuity. Nose:  No deformity, discharge,  or lesions. Mouth: Not examined.   Neck:  Supple; no masses or thyromegaly. Lungs: Decreased breath sounds bilateral bases . Heart:  Regular rate and rhythm; no murmurs, clicks, rubs,  or gallops. Abdomen:  Soft, tender across the lower abdomen right greater than left, BS active,nonpalp mass or hsm.   Msk:  Symmetrical without gross deformities. . Pulses:  Normal pulses noted. Extremities:  Without clubbing or edema. Neurologic:  Alert and  oriented x4;  grossly normal neurologically. Skin: Multiple ecchymoses in the upper extremities from attempts at IV placement Psych:  Alert and cooperative. Normal mood and affect.   Lab Results: Recent Labs    01/06/19 1759 01/07/19 0056  WBC 10.9* 10.7*  HGB 10.5* 10.6*  HCT  33.0* 34.5*  PLT 225 223   BMET Recent Labs    01/06/19 1744 01/07/19 0056  NA 138 139  K 4.9 4.5  CL 107 108  CO2 16* 20*  GLUCOSE 90 66*  BUN 21 20  CREATININE 1.66* 1.63*  CALCIUM 7.9* 7.5*   LFT Recent Labs    01/07/19 0056  PROT 5.4*  ALBUMIN 2.0*  AST 80*  ALT 16  ALKPHOS 210*  BILITOT 2.3*     IMPRESSION:  #43 68 year old white female with history of rheumatoid arthritis, prior history of necrotizing glomerulonephritis and admission January/February 2020,at which time she was diagnosed with CMV colitis and HSV esophagitis. She was not on any immunosuppressants at that time .  Patient has had persistent abdominal discomfort, and diarrhea over the past 3 to 4 months, and he had onset of grossly bloody stools 2 days ago which has improved. Recent infectious work-up negative, stool for lactoferrin was positive.  Rule out new onset IBD, rule out underlying malignancy, rule out persistent ileocecal valve ulceration/mass.  Rule out persistent or recurrent CMV  #2 new finding of probable cirrhosis on CT with ascites-etiology not clear, multiple labs have been sent.  Patient does have history of long-term methotrexate use  #3 pleural effusions and possible left upper lobe pneumonia-defer to hospitalist #4 chronic anemia #5.  Hypertension #6.  Hyperlipidemia #7.  History of necrotizing glomerulonephritis  #8 Malnutrition - ALB 1.9  Plan; start soft diet Patient will need repeat colonoscopy this admit, but is not up to prepping at present. Await multiple pending autoimmune markers, and chronic hepatic markers as well as acute hepatitis panel. Will defer to hospitalist regarding possible pneumonia, she states she has been coughing for about 3 weeks and has had some productive sputum.  Not currently on antibiotics  Thank you we will follow with you    Amy EsterwoodPA-C  01/07/2019, 9:53 AM    [facility] Attending   I have taken an interval history, reviewed the  chart and examined the patient. I agree with the Advanced Practitioner's note, impression and recommendations.     Quite a complicated situation as outlined above.  Wonder about recurrent or persistent CMV though would expect fever  She does not seem to have cirrhosis and w/ NL PLT would not think so  She needs good supportive care and we will see her and set up coplonoscopy when appropriate  Gatha Mayer, MD, [facility] 01/07/2019 6:17 PM Pager [phone]

## 2019-01-07 NOTE — Progress Notes (Signed)
PROGRESS NOTE    Kaylee Rasmussen  FAO:[id number] DOB: May 05, 1951 DOA: 01/06/2019 PCP: Paulina Fusi, MD   Brief Narrative:  Kaylee Rasmussen  is Kaylee Rasmussen 68 y.o. 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 Bx-CMV colitis. No malignancy. Records scanned in "MEDIA TAB".EGD showed LA grade D reflux esophagitis, gastritis. -Colonoscopy 08/12/2017-small colonic polyp, moderate colonic diverticulosis  Reviewed CT scan Abd/ pelvis  IMPRESSION: 1. Abnormal appearance of the liver with heterogeneous hepatic steatosis and heterogeneous liver enhancement. Questionable fine liver surface irregularity. Cannot exclude hepatic cirrhosis. No discrete liver masses. Recommend correlation with liver enzymes. 2. Third-spacing of fluid with moderate ascites, small dependent bilateral pleural effusions, right greater than left, and mild anasarca. 3. No evidence of bowel obstruction or acute bowel inflammation. Mild colonic diverticulosis, without convincing findings of acute diverticulitis. 4. Patchy bandlike consolidation in the left lung base, cannot exclude pneumonia or aspiration. 5. Left lung base 4 mm solid pulmonary nodule. No follow-up needed if patient is low-risk. Non-contrast chest CT can be considered in 12 months if patient is high-risk.    In ED,  T 98.5  P 67 R 16  Bp 80/58  Pox 97% on RA Wt 63.5 kg  Current BP 121/58  Na  138, K 4.9,  Bun 21, Creatinine 1.66 Hco3 16, AG 15 Alb 2.2 Ast 96, Alt 20, Alk phos 258, T. Bili 2.6  Wbc 10.9, hgb 10.5, Plt 225 Type and screen done  FOBT negative  covid -19 negative  Pt will be admitted for c/o rectal bleeding , and chronic diarrhea, and non-AG acidosis secondary to diarrhea, and abnormal liver function.    Assessment & Plan:   Principal Problem:   ARF (acute renal failure) (HCC) Active Problems:   Rheumatoid arthritis involving multiple sites with positive rheumatoid factor (HCC)   History of hypertension   History of hypothyroidism   Abnormal liver function   Diarrhea   Pulmonary nodule   Metabolic acidosis  Blood in stool GI consult, appreciate recommendations Plan for eventual colonoscopy ? Recurrent/persistent CMV  Follow CMV dna by pcr  Abnormal liver function  Check GGT , check cpk Check ferritin, iron, tibc, ceruloplasmin, alpha 1 antitripsin, ana, antismooth muscle ab, check anti mitochondrial antibody Check MRCP -> with severe diffuse hepatic steatosis (can't exclude cirrhosis) - solitary subcentimeter R liver lesion, likely benign cyst -> recommend f/u MRI with and without IV contrast in 3-6 months  Chronic diarrhea Check celiac panel Check stool GI pathogen panel Check stool C. Diff Cholestyramine prn Appreciate GI recs  Community Acquired Pneumonia   Fever:  Follow blood cx CXR with patchy airspace dz in L lung base (early pneumonia vs sequelae of aspiration) Follow speech recs Ceftriaxone/azithro  Right Greater than Left Effusions   Moderate Volume Ascites:  Continue to monitor for now, consider diuresis  Non- AG acidosis Improved, d/c bicarb  CKD III: baseline creatinine appears to be ~1.5, continue to monitor  N/v Zofran 4mg  iv q6h prn   Hypertension Cont Amlodipine 2.5mg  po qday Cont Toprol XL 100mg   po qday  Hypothyroidism Cont Levothyroxine 88 micrograms po qday  Anxiety Cont Lexapro 10mg  po  qday  Anemia Check cbc in am  RA Off Humira, follows with Deveshwar Voltaren gel prn   Pulmonary Nodule: outpatient follow up  DVT prophylaxis: SCD Code Status: full  Family Communication: none at bedside Disposition Plan: pending   Consultants:   GI  Procedures:   none  Antimicrobials:  Anti-infectives (From admission, onward)   Start     Dose/Rate Route Frequency Ordered Stop   01/07/19 1700  cefTRIAXone (ROCEPHIN) 2 g in sodium chloride 0.9 % 100 mL IVPB     2 g 200 mL/hr over 30 Minutes Intravenous Every 24 hours 01/07/19 1557 01/12/19 1659   01/07/19 1700  azithromycin (ZITHROMAX) 500 mg in sodium chloride 0.9 % 250 mL IVPB     500 mg 250 mL/hr over 60 Minutes Intravenous Every 24 hours 01/07/19 1557 01/12/19 1659     Subjective: Came due to persistent diarrhea and blood in stool No significant SOB Occasional cough  Objective: Vitals:   01/07/19 1239 01/07/19 1345 01/07/19 1720 01/07/19 1724  BP:  126/61    Pulse:  79    Resp:  17    Temp: 99.3 F (37.4 C) 99.9 F (37.7 C) 99.5 F (37.5 C) (!) 103.1 F (39.5 C)  TempSrc: Oral Oral Oral Rectal  SpO2:  94%    Weight:      Height:        Intake/Output Summary (Last 24 hours) at 01/07/2019 1834 Last data filed at 01/07/2019 1800 Gross per 24 hour  Intake 1759.67 ml  Output --  Net 1759.67 ml   Filed Weights   01/06/19 1642 01/07/19 0038 01/07/19 0500  Weight: 63.5 kg 67.3 kg 68.1 kg    Examination:  General exam: Appears calm and comfortable  Respiratory system: Clear to auscultation. Respiratory effort normal. Cardiovascular system: S1 & S2 heard, RRR. Gastrointestinal system: Abdomen is nondistended, soft and nontender. Central nervous system: Alert and oriented. No focal neurological deficits. Extremities: no LEE Skin: No rashes, lesions or ulcers Psychiatry: Judgement and insight appear normal. Mood & affect appropriate.     Data Reviewed: I have personally reviewed following  labs and imaging studies  CBC: Recent Labs  Lab 01/06/19 1759 01/07/19 0056 01/07/19 0928  WBC 10.9* 10.7* 11.1*  HGB 10.5* 10.6* 10.2*  HCT 33.0* 34.5* 31.7*  MCV 97.6 99.7 96.9  PLT 225 223 232   Basic Metabolic Panel: Recent Labs  Lab 01/06/19 1744 01/07/19 0056 01/07/19 0928  NA 138 139 139  K 4.9 4.5 3.6  CL 107 108 106  CO2 16* 20* 22  GLUCOSE 90 66* 106*  BUN 21 20 22   CREATININE 1.66* 1.63* 1.63*  CALCIUM 7.9* 7.5* 7.4*   GFR: Estimated Creatinine Clearance: 30 mL/min (Chyla Schlender) (by C-G formula based on SCr of 1.63 mg/dL (H)). Liver Function Tests: Recent Labs  Lab 01/06/19 1744 01/07/19 0056 01/07/19 0928  AST 96* 80* 72*  ALT 20 16 16   ALKPHOS 258* 210* 198*  BILITOT 2.6* 2.3* 2.1*  PROT 5.8* 5.4* 4.9*  ALBUMIN 2.2* 2.0* 1.9*   No results for input(s): LIPASE, AMYLASE in the last 168 hours. No results for input(s): AMMONIA in the last 168 hours. Coagulation Profile: No results for input(s): INR, PROTIME in the last 168 hours. Cardiac Enzymes: No results for input(s): CKTOTAL, CKMB, CKMBINDEX, TROPONINI in the last 168 hours. BNP (last 3 results) No results for input(s): PROBNP in the  last 8760 hours. HbA1C: No results for input(s): HGBA1C in the last 72 hours. CBG: Recent Labs  Lab 01/07/19 0121 01/07/19 0140 01/07/19 0216 01/07/19 0737 01/07/19 1341  GLUCAP 62* 65* 120* 85 97   Lipid Profile: No results for input(s): CHOL, HDL, LDLCALC, TRIG, CHOLHDL, LDLDIRECT in the last 72 hours. Thyroid Function Tests: Recent Labs    01/07/19 0928  TSH 1.980   Anemia Panel: Recent Labs    01/07/19 0928  VITAMINB12 1,199*  FERRITIN 498*  TIBC NOT CALCULATED  IRON 81   Sepsis Labs: No results for input(s): PROCALCITON, LATICACIDVEN in the last 168 hours.  Recent Results (from the past 240 hour(s))  Stool Culture     Status: None   Collection Time: 12/31/18 12:43 PM   Specimen: Stool  Result Value Ref Range Status   MICRO NUMBER: [id number]   Final   SPECIMEN QUALITY: Adequate  Final   SOURCE: NOT GIVEN  Final   STATUS: FINAL  Final   SHIGA RESULT: Not Detected  Final   MICRO NUMBER: [id number]  Final   SPECIMEN QUALITY: Adequate  Final   Source NOT GIVEN  Final   STATUS: FINAL  Final   CAM RESULT: No enteric Campylobacter isolated  Final   MICRO NUMBER: [id number]  Final   SPECIMEN QUALITY: Adequate  Final   SOURCE: NOT GIVEN  Final   STATUS: FINAL  Final   SS RESULT: No Salmonella or Shigella isolated  Final  SARS Coronavirus 2 (CEPHEID - Performed in [facility] hospital lab), Hosp Order     Status: None   Collection Time: 01/06/19  9:23 PM   Specimen: Nasopharyngeal Swab  Result Value Ref Range Status   SARS Coronavirus 2 NEGATIVE NEGATIVE Final    Comment: (NOTE) If result is NEGATIVE SARS-CoV-2 target nucleic acids are NOT DETECTED. The SARS-CoV-2 RNA is generally detectable in upper and lower  respiratory specimens during the acute phase of infection. The lowest  concentration of SARS-CoV-2 viral copies this assay can detect is 250  copies / mL. Breyden Jeudy negative result does not preclude SARS-CoV-2 infection  and should not be used as the sole basis for treatment or other  patient management decisions.  Cornell Gaber negative result may occur with  improper specimen collection / handling, submission of specimen other  than nasopharyngeal swab, presence of viral mutation(s) within the  areas targeted by this assay, and inadequate number of viral copies  (<250 copies / mL). Sianna Garofano negative result must be combined with clinical  observations, patient history, and epidemiological information. If result is POSITIVE SARS-CoV-2 target nucleic acids are DETECTED. The SARS-CoV-2 RNA is generally detectable in upper and lower  respiratory specimens dur ing the acute phase of infection.  Positive  results are indicative of active infection with SARS-CoV-2.  Clinical  correlation with patient history and other diagnostic information is  necessary  to determine patient infection status.  Positive results do  not rule out bacterial infection or co-infection with other viruses. If result is PRESUMPTIVE POSTIVE SARS-CoV-2 nucleic acids MAY BE PRESENT.   Ashdon Gillson presumptive positive result was obtained on the submitted specimen  and confirmed on repeat testing.  While 2019 novel coronavirus  (SARS-CoV-2) nucleic acids may be present in the submitted sample  additional confirmatory testing may be necessary for epidemiological  and / or clinical management purposes  to differentiate between  SARS-CoV-2 and other Sarbecovirus currently known to infect humans.  If clinically indicated additional testing with an alternate test  methodology (  [id number]) is advised. The SARS-CoV-2 RNA is generally  detectable in upper and lower respiratory sp ecimens during the acute  phase of infection. The expected result is Negative. Fact Sheet for Patients:  BoilerBrush.com.cy Fact Sheet for Healthcare Providers: https://pope.com/ This test is not yet approved or cleared by the [place] FDA and has been authorized for detection and/or diagnosis of SARS-CoV-2 by FDA under an Emergency Use Authorization (EUA).  This EUA will remain in effect (meaning this test can be used) for the duration of the COVID-19 declaration under Section 564(b)(1) of the Act, 21 U.S.C. section 360bbb-3(b)(1), unless the authorization is terminated or revoked sooner. Performed at [facility], [address], [place], [place] [address]          Radiology Studies: Ct Abdomen Pelvis W Contrast  Result Date: 01/06/2019 CLINICAL DATA:  Abdominal pain, nausea, vomiting and bloating. EXAM: CT ABDOMEN AND PELVIS WITH CONTRAST TECHNIQUE: Multidetector CT imaging of the abdomen and pelvis was performed using the standard protocol following bolus administration of intravenous contrast. CONTRAST:  80mL OMNIPAQUE IOHEXOL  300 MG/ML  SOLN COMPARISON:  04/24/2016 abdominal sonogram. FINDINGS: Lower chest: Small dependent bilateral pleural effusions, right greater than left. Left lower lobe 4 mm solid pulmonary nodule (series 3/image 7). Patchy bandlike consolidation in the left lower lobe. Coronary atherosclerosis. Hepatobiliary: Heterogeneous diffuse hepatic steatosis. Markedly heterogeneous liver parenchymal enhancement. No discrete liver mass. Questionable fine liver surface irregularity. Distended gallbladder. No gallbladder wall thickening. No radiopaque cholelithiasis. No biliary ductal dilatation. CBD diameter 5 mm. Pancreas: Normal, with no mass or duct dilation. Spleen: Normal size. No mass. Adrenals/Urinary Tract: Normal adrenals. Normal kidneys with no hydronephrosis and no renal mass. Bladder obscured by streak artifact from right hip hardware. Bladder is nondistended and without gross abnormality. Stomach/Bowel: Normal non-distended stomach. Normal caliber small bowel with no small bowel wall thickening. Normal appendix. Mild left colonic diverticulosis. No large bowel wall thickening. Vascular/Lymphatic: Atherosclerotic nonaneurysmal abdominal aorta. Patent portal, splenic, hepatic and renal veins. No pathologically enlarged lymph nodes in the abdomen or pelvis. Reproductive: Status post hysterectomy, with no abnormal findings at the vaginal cuff. No adnexal mass. Other: No pneumoperitoneum. Moderate volume ascites with simple fluid density. No focal fluid collection. Mild anasarca. Musculoskeletal: No aggressive appearing focal osseous lesions. Right total hip arthroplasty. Mild superior T11 and T12, mild inferior L2 and mild superior L3 and L4 vertebral compression fractures of indeterminate chronicity. Mild thoracolumbar spondylosis. IMPRESSION: 1. Abnormal appearance of the liver with heterogeneous hepatic steatosis and heterogeneous liver enhancement. Questionable fine liver surface irregularity. Cannot exclude  hepatic cirrhosis. No discrete liver masses. Recommend correlation with liver enzymes. 2. Third-spacing of fluid with moderate ascites, small dependent bilateral pleural effusions, right greater than left, and mild anasarca. 3. No evidence of bowel obstruction or acute bowel inflammation. Mild colonic diverticulosis, without convincing findings of acute diverticulitis. 4. Patchy bandlike consolidation in the left lung base, cannot exclude pneumonia or aspiration. 5. Left lung base 4 mm solid pulmonary nodule. No follow-up needed if patient is low-risk. Non-contrast chest CT can be considered in 12 months if patient is high-risk. This recommendation follows the consensus statement: Guidelines for Management of Incidental Pulmonary Nodules Detected on CT Images:From the Fleischner Society 2017; published online before print (10.1148/radiol.0102725366). 6.  Aortic Atherosclerosis (ICD10-I70.0). Electronically Signed   By: Delbert Phenix M.D.   On: 01/06/2019 17:58   Mr Abdomen Mrcp Wo Contrast  Result Date: 01/07/2019 CLINICAL DATA:  Inpatient. Abnormal liver function tests. Abnormal appearance of  the liver on CT study performed 1 day prior. Diarrhea, nausea, vomiting, bloody stools. EXAM: MRI ABDOMEN WITHOUT CONTRAST  (INCLUDING MRCP) TECHNIQUE: Multiplanar multisequence MR imaging of the abdomen was performed. Heavily T2-weighted images of the biliary and pancreatic ducts were obtained, and three-dimensional MRCP images were rendered by post processing. COMPARISON:  01/06/2019 CT abdomen/pelvis. FINDINGS: Lower chest: Small dependent bilateral pleural effusions, right greater than left. Hepatobiliary: Severe diffuse hepatic steatosis. Questionable fine liver surface irregularity. Normal liver size. There is Skyy Nilan 0.5 cm subcapsular T2 hyperintense T1 hypointense lesion in the inferior right liver lobe (series 2/image 41), incompletely characterized on this noncontrast MRI, probably Essynce Munsch simple cyst. No additional liver  lesions. Normal gallbladder with no cholelithiasis. No biliary ductal dilatation. Common bile duct diameter 5 mm. No evidence of choledocholithiasis. No biliary strictures or beading. Pancreas: No pancreatic mass or duct dilation.  No pancreas divisum. Spleen: Normal size. No mass. Adrenals/Urinary Tract: Normal adrenals. No hydronephrosis. Normal kidneys with no renal mass. Stomach/Bowel: Normal non-distended stomach. Visualized small and large bowel is normal caliber, with no bowel wall thickening. Vascular/Lymphatic: Normal caliber abdominal aorta. No pathologically enlarged lymph nodes in the abdomen. Other: Moderate volume ascites. No focal fluid collection. Mild anasarca. Musculoskeletal: No aggressive appearing focal osseous lesions. Mild T11, moderate T12 and mild L3 and L4 vertebral compression fractures of uncertain chronicity. IMPRESSION: 1. Severe diffuse hepatic steatosis. Questionable fine liver surface irregularity, cannot exclude hepatic cirrhosis. Solitary subcentimeter T2 hyperintense inferior right liver lesion, incompletely characterized on this noncontrast MRI study, probably Graceanna Theissen benign cyst. Suggest follow-up MRI without and with IV contrast in 3-6 months. 2. No biliary ductal dilatation. No cholelithiasis or choledocholithiasis. 3. Small dependent bilateral pleural effusions, right greater than left. 4. Moderate volume ascites. 5. Mild anasarca. 6. Multilevel thoracolumbar vertebral compression fractures of uncertain chronicity. Electronically Signed   By: Delbert Phenix M.D.   On: 01/07/2019 12:20   Mr 3d Recon At Scanner  Result Date: 01/07/2019 CLINICAL DATA:  Inpatient. Abnormal liver function tests. Abnormal appearance of the liver on CT study performed 1 day prior. Diarrhea, nausea, vomiting, bloody stools. EXAM: MRI ABDOMEN WITHOUT CONTRAST  (INCLUDING MRCP) TECHNIQUE: Multiplanar multisequence MR imaging of the abdomen was performed. Heavily T2-weighted images of the biliary and  pancreatic ducts were obtained, and three-dimensional MRCP images were rendered by post processing. COMPARISON:  01/06/2019 CT abdomen/pelvis. FINDINGS: Lower chest: Small dependent bilateral pleural effusions, right greater than left. Hepatobiliary: Severe diffuse hepatic steatosis. Questionable fine liver surface irregularity. Normal liver size. There is Lucifer Soja 0.5 cm subcapsular T2 hyperintense T1 hypointense lesion in the inferior right liver lobe (series 2/image 41), incompletely characterized on this noncontrast MRI, probably Azaliah Carrero simple cyst. No additional liver lesions. Normal gallbladder with no cholelithiasis. No biliary ductal dilatation. Common bile duct diameter 5 mm. No evidence of choledocholithiasis. No biliary strictures or beading. Pancreas: No pancreatic mass or duct dilation.  No pancreas divisum. Spleen: Normal size. No mass. Adrenals/Urinary Tract: Normal adrenals. No hydronephrosis. Normal kidneys with no renal mass. Stomach/Bowel: Normal non-distended stomach. Visualized small and large bowel is normal caliber, with no bowel wall thickening. Vascular/Lymphatic: Normal caliber abdominal aorta. No pathologically enlarged lymph nodes in the abdomen. Other: Moderate volume ascites. No focal fluid collection. Mild anasarca. Musculoskeletal: No aggressive appearing focal osseous lesions. Mild T11, moderate T12 and mild L3 and L4 vertebral compression fractures of uncertain chronicity. IMPRESSION: 1. Severe diffuse hepatic steatosis. Questionable fine liver surface irregularity, cannot exclude hepatic cirrhosis. Solitary subcentimeter T2 hyperintense inferior right liver  lesion, incompletely characterized on this noncontrast MRI study, probably Jarman Litton benign cyst. Suggest follow-up MRI without and with IV contrast in 3-6 months. 2. No biliary ductal dilatation. No cholelithiasis or choledocholithiasis. 3. Small dependent bilateral pleural effusions, right greater than left. 4. Moderate volume ascites. 5. Mild  anasarca. 6. Multilevel thoracolumbar vertebral compression fractures of uncertain chronicity. Electronically Signed   By: Delbert Phenix M.D.   On: 01/07/2019 12:20   Dg Chest Port 1 View  Result Date: 01/06/2019 CLINICAL DATA:  CT abdomen pelvis 01/06/2019. EXAM: PORTABLE CHEST 1 VIEW COMPARISON:  Same-day CT abdomen and pelvis, chest radiograph 07/05/2018. FINDINGS: Corresponding well to the CT abnormality is Malori Myers patchy region of airspace disease in the left lung base. The 4 mm nodule seen on CT is not radiographically apparent. Small right pleural effusion is present. No pneumothorax. Patient is rotated left anterior oblique superimposing much the cardiac silhouette over the right chest. Cardiomediastinal contours are otherwise unremarkable. No acute osseous or soft tissue abnormality. IMPRESSION: Patchy airspace disease in left lung base could reflect early pneumonia or sequela of aspiration in the appropriate setting. Electronically Signed   By: Kreg Shropshire M.D.   On: 01/06/2019 22:36        Scheduled Meds:  amLODipine  2.5 mg Oral QHS   calcium carbonate  500 mg of elemental calcium Oral BID   And   cholecalciferol  200 Units Oral BID   enoxaparin (LOVENOX) injection  30 mg Subcutaneous QHS   escitalopram  10 mg Oral Daily   feeding supplement (PRO-STAT SUGAR FREE 64)  30 mL Oral BID   levothyroxine  88 mcg Oral Q0600   metoprolol succinate  100 mg Oral Daily   potassium chloride  10 mEq Oral Daily   sodium bicarbonate  650 mg Oral BID   Continuous Infusions:  azithromycin 500 mg (01/07/19 1821)   cefTRIAXone (ROCEPHIN)  IV Stopped (01/07/19 1725)    sodium bicarbonate  infusion 1000 mL 75 mL/hr at 01/07/19 1800     LOS: 0 days    Time spent: over 30 min    Lacretia Nicks, MD [facility] Pager AMION  If 7PM-7AM, please contact night-coverage [email or web address] Password [facility] 01/07/2019, 6:34 PM

## 2019-01-08 ENCOUNTER — Inpatient Hospital Stay (HOSPITAL_COMMUNITY): Payer: PPO

## 2019-01-08 ENCOUNTER — Encounter (HOSPITAL_COMMUNITY): Payer: Self-pay | Admitting: Internal Medicine

## 2019-01-08 DIAGNOSIS — R609 Edema, unspecified: Secondary | ICD-10-CM

## 2019-01-08 DIAGNOSIS — E43 Unspecified severe protein-calorie malnutrition: Secondary | ICD-10-CM

## 2019-01-08 HISTORY — DX: Unspecified severe protein-calorie malnutrition: E43

## 2019-01-08 LAB — COMPREHENSIVE METABOLIC PANEL
ALT: 13 U/L (ref 0–44)
AST: 56 U/L — ABNORMAL HIGH (ref 15–41)
Albumin: 1.6 g/dL — ABNORMAL LOW (ref 3.5–5.0)
Alkaline Phosphatase: 160 U/L — ABNORMAL HIGH (ref 38–126)
Anion gap: 12 (ref 5–15)
BUN: 23 mg/dL (ref 8–23)
CO2: 21 mmol/L — ABNORMAL LOW (ref 22–32)
Calcium: 7.1 mg/dL — ABNORMAL LOW (ref 8.9–10.3)
Chloride: 106 mmol/L (ref 98–111)
Creatinine, Ser: 2.04 mg/dL — ABNORMAL HIGH (ref 0.44–1.00)
GFR calc Af Amer: 29 mL/min — ABNORMAL LOW (ref >60)
GFR calc non Af Amer: 25 mL/min — ABNORMAL LOW (ref >60)
Glucose, Bld: 96 mg/dL (ref 70–99)
Potassium: 4.1 mmol/L (ref 3.5–5.1)
Sodium: 139 mmol/L (ref 135–145)
Total Bilirubin: 2.3 mg/dL — ABNORMAL HIGH (ref 0.3–1.2)
Total Protein: 4.7 g/dL — ABNORMAL LOW (ref 6.5–8.1)

## 2019-01-08 LAB — CBC
HCT: 30.3 % — ABNORMAL LOW (ref 36.0–46.0)
Hemoglobin: 9.6 g/dL — ABNORMAL LOW (ref 12.0–15.0)
MCH: 31.5 pg (ref 26.0–34.0)
MCHC: 31.7 g/dL (ref 30.0–36.0)
MCV: 99.3 fL (ref 80.0–100.0)
Platelets: 194 10*3/uL (ref 150–400)
RBC: 3.05 MIL/uL — ABNORMAL LOW (ref 3.87–5.11)
RDW: 18.3 % — ABNORMAL HIGH (ref 11.5–15.5)
WBC: 15.3 10*3/uL — ABNORMAL HIGH (ref 4.0–10.5)
nRBC: 0 % (ref 0.0–0.2)

## 2019-01-08 LAB — HEPATITIS PANEL, ACUTE
HCV Ab: 0.2 s/co ratio (ref 0.0–0.9)
Hep A IgM: NEGATIVE
Hep B C IgM: NEGATIVE
Hepatitis B Surface Ag: NEGATIVE

## 2019-01-08 LAB — TISSUE TRANSGLUTAMINASE, IGA: Tissue Transglutaminase Ab, IgA: <2 U/mL (ref 0–3)

## 2019-01-08 LAB — GLUCOSE, CAPILLARY
Glucose-Capillary: 89 mg/dL (ref 70–99)
Glucose-Capillary: 72 mg/dL (ref 70–99)

## 2019-01-08 LAB — ANA: Anti Nuclear Antibody (ANA): NEGATIVE

## 2019-01-08 LAB — ALPHA-1-ANTITRYPSIN: A-1 Antitrypsin, Ser: 141 mg/dL (ref 101–187)

## 2019-01-08 LAB — CERULOPLASMIN: Ceruloplasmin: 22.7 mg/dL (ref 19.0–39.0)

## 2019-01-08 MED ORDER — LACTATED RINGERS IV BOLUS
1000.0000 mL | Freq: Once | INTRAVENOUS | Status: AC
  Administered 2019-01-08: 1000 mL via INTRAVENOUS

## 2019-01-08 MED ORDER — SODIUM CHLORIDE 0.9 % IV BOLUS
500.0000 mL | Freq: Once | INTRAVENOUS | Status: AC
  Administered 2019-01-08: 500 mL via INTRAVENOUS

## 2019-01-08 MED ORDER — POLYETHYLENE GLYCOL 3350 17 GM/SCOOP PO POWD
0.5000 | Freq: Once | ORAL | Status: AC
  Administered 2019-01-08: 127.5 g via ORAL
  Filled 2019-01-08: qty 255

## 2019-01-08 MED ORDER — METOCLOPRAMIDE HCL 5 MG/ML IJ SOLN
10.0000 mg | Freq: Once | INTRAMUSCULAR | Status: AC
  Administered 2019-01-08: 19:00:00 10 mg via INTRAVENOUS
  Filled 2019-01-08: qty 2

## 2019-01-08 MED ORDER — METOCLOPRAMIDE HCL 5 MG/ML IJ SOLN
10.0000 mg | Freq: Once | INTRAMUSCULAR | Status: AC
  Administered 2019-01-09: 10 mg via INTRAVENOUS
  Filled 2019-01-08: qty 2

## 2019-01-08 MED ORDER — LACTATED RINGERS IV BOLUS: 1000.0000 mL | Freq: Once | INTRAVENOUS | Status: DC

## 2019-01-08 MED ORDER — BISACODYL 5 MG PO TBEC
20.0000 mg | DELAYED_RELEASE_TABLET | Freq: Once | ORAL | Status: AC
  Administered 2019-01-08: 20 mg via ORAL
  Filled 2019-01-08: qty 4

## 2019-01-08 MED ORDER — LACTATED RINGERS IV SOLN
INTRAVENOUS | Status: DC
  Administered 2019-01-08 – 2019-01-10 (×4): via INTRAVENOUS

## 2019-01-08 NOTE — H&P (View-Only) (Signed)
   Patient Name: Kaylee Rasmussen Date of Encounter: 01/08/2019, 9:15 AM    Subjective  Feels stronger Ready to do a colonoscopy   Objective  BP (!) 94/55 (BP Location: Left Arm)   Pulse 63   Temp 98.1 F (36.7 C)   Resp 16   Ht '5\' 1"'$  (1.549 m)   Wt 68.9 kg   SpO2 (!) 88%   BMI 28.68 kg/m  Chronically ill A bit more vigorous Lungs clr ant Ht s1s2 abd soft NT  Lab Results  Component Value Date   WBC 15.3 (H) 01/08/2019   HGB 9.6 (L) 01/08/2019   HCT 30.3 (L) 01/08/2019   MCV 99.3 01/08/2019   PLT 194 01/08/2019   Lab Results  Component Value Date   ALT 13 01/08/2019   AST 56 (H) 01/08/2019   ALKPHOS 160 (H) 01/08/2019   BILITOT 2.3 (H) 01/08/2019   Prealbumin 5! TSH NL ESR 18 Lab Results  Component Value Date   VITAMINB12 1,199 (H) 01/07/2019   Lab Results  Component Value Date   FERRITIN 498 (H) 01/07/2019     Assessment and Plan  Diarrhea Rectal bleeding/hematochezia Past cecal CMV ulcer 06/2018 Severe protein calorie malnutrition Abnl LFT's RA   Colonoscopy tomorrow F/U Labs Dietitian consult if not done PA/Lat CXR Gatha Mayer, MD, [facility] 01/08/2019 9:15 AM Pager [phone]

## 2019-01-08 NOTE — Progress Notes (Addendum)
PROGRESS NOTE    MERI WILHELMI  ZOX:[id number] DOB: 03-03-1951 DOA: 01/06/2019 PCP: Paulina Fusi, MD   Brief Narrative:  Maryjean Quamme  is Kaid Seeberger 68 y.o. 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 Bx-CMV colitis. No malignancy. Records scanned in "MEDIA TAB".EGD showed LA grade D reflux esophagitis, gastritis. -Colonoscopy 08/12/2017-small colonic polyp, moderate colonic diverticulosis  Reviewed CT scan Abd/ pelvis  IMPRESSION: 1. Abnormal appearance of the liver with heterogeneous hepatic steatosis and heterogeneous liver enhancement. Questionable fine liver surface irregularity. Cannot exclude hepatic cirrhosis. No discrete liver masses. Recommend correlation with liver enzymes. 2. Third-spacing of fluid with moderate ascites, small dependent bilateral pleural effusions, right greater than left, and mild anasarca. 3. No evidence of bowel obstruction or acute bowel inflammation. Mild colonic diverticulosis, without convincing findings of acute diverticulitis. 4. Patchy bandlike consolidation in the left lung base, cannot exclude pneumonia or aspiration. 5. Left lung base 4 mm solid pulmonary nodule. No follow-up needed if patient is low-risk. Non-contrast chest CT can be considered in 12 months if patient is high-risk.    In ED,  T 98.5  P 67 R 16  Bp 80/58  Pox 97% on RA Wt 63.5 kg  Current BP 121/58  Na  138, K 4.9,  Bun 21, Creatinine 1.66 Hco3 16, AG 15 Alb 2.2 Ast 96, Alt 20, Alk phos 258, T. Bili 2.6  Wbc 10.9, hgb 10.5, Plt 225 Type and screen done  FOBT negative  covid -19 negative  Pt will be admitted for c/o rectal bleeding , and chronic diarrhea, and non-AG acidosis secondary to diarrhea, and abnormal liver function.    Assessment & Plan:   Principal Problem:   ARF (acute renal failure) (HCC) Active Problems:   Rheumatoid arthritis involving multiple sites with positive rheumatoid factor (HCC)   History of hypertension   History of hypothyroidism   Abnormal liver function   Diarrhea   Pulmonary nodule   Metabolic acidosis   Severe protein-calorie malnutrition (HCC)  Blood in stool GI consult, appreciate recommendations Plan for eventual colonoscopy -> plan for tomorrow ? Recurrent/persistent CMV  Follow CMV dna by pcr (pending)  Abnormal liver function  Check GGT (elevated) Check ferritin (elevated), iron (wnl), tibc (not calc), ceruloplasmin (wnl), alpha 1 antitripsin (wnl), ana (negative), antismooth muscle ab (pending), check anti mitochondrial antibody (pending) Check MRCP -> with severe diffuse hepatic steatosis (can't exclude cirrhosis) - solitary subcentimeter R liver lesion, likely benign cyst -> recommend f/u MRI with and without IV contrast in 3-6 months  Chronic diarrhea Check celiac panel (gliadin antibodies, TTG, and reticulin antibody pending) Cholestyramine prn Appreciate GI recs  Community Acquired Pneumonia   Fever:  Follow blood cx CXR with patchy airspace dz in L lung base (early pneumonia vs sequelae of aspiration) Repeat 2 view CXR with small bilateral effusions and associated atelectasis vs consolidation Follow speech recs Ceftriaxone/azithro  Hypotension: she had low BP overnight which has improved.  Given 1 L LR bolus.  BP now in 90's.  Holding BP meds.  Continue IVF.  Right Greater than Left Effusions   Moderate Volume  Ascites:  Continue to monitor for now  Non- AG acidosis Improved, d/c IV bicarb Pt on PO bicarb  AKI on CKD III: creatinine worsened today in setting of soft BP's.  Follow with IVFs.  baseline creatinine appears to be ~1.5, continue to monitor Follow urinalysis  N/v Zofran 4mg  iv q6h prn   Hypertension Hold antihypertensives below with hypotension this AM Cont Amlodipine 2.5mg  po qday Cont Toprol XL 100mg  po qday  Hypothyroidism Cont Levothyroxine 88 micrograms po qday  Anxiety Cont Lexapro 10mg  po qday  Anemia Check cbc in am  RA Off Humira, follows with Deveshwar Voltaren gel prn   Pulmonary Nodule: outpatient follow up  Right greater than Left LEE:  US done today of left leg erroneously (order was placed for R).  Will follow up US of R leg.  DVT prophylaxis: SCD Code Status: full  Family Communication: none at bedside Disposition Plan: pending   Consultants:   GI  Procedures:   none  Antimicrobials:  Anti-infectives (From admission, onward)   Start     Dose/Rate Route Frequency Ordered Stop   01/07/19 1700  cefTRIAXone (ROCEPHIN) 2 g in sodium chloride 0.9 % 100 mL IVPB     2 g 200 mL/hr over 30 Minutes Intravenous Every 24 hours 01/07/19 1557 01/12/19 1659   01/07/19 1700  azithromycin (ZITHROMAX) 500 mg in sodium chloride 0.9 % 250 mL IVPB     500 mg 250 mL/hr over 60 Minutes Intravenous Every 24 hours 01/07/19 1557 01/12/19 1659     Subjective: Feels ok today. GI sx about the same.  Objective: Vitals:   01/08/19 0540 01/08/19 0800 01/08/19 1200 01/08/19 1323  BP: (!) 78/54 (!) 94/55 (!) 96/49 (!) 97/57  Pulse:   68 66  Resp:    17  Temp:    98.6 F (37 C)  TempSrc:    Oral  SpO2:    100%  Weight:      Height:        Intake/Output Summary (Last 24 hours) at 01/08/2019 1616 Last data filed at 01/08/2019 1000 Gross per 24 hour  Intake 589.51 ml  Output --  Net 589.51 ml   Filed Weights   01/07/19 0038 01/07/19 0500  01/08/19 0523  Weight: 67.3 kg 68.1 kg 68.9 kg    Examination:  General: No acute distress. Cardiovascular: Heart sounds show Carmella Kees regular rate, and rhythm. Lungs: Clear to auscultation bilaterally  Abdomen: Soft, nontender, nondistended Neurological: Alert and oriented 3. Moves all extremities 4. Cranial nerves II through XII grossly intact. Skin: Warm and dry. No rashes or lesions. Extremities: No clubbing or cyanosis. No edema.   Data Reviewed: I have personally reviewed following labs and imaging studies  CBC: Recent Labs  Lab 01/06/19 1759 01/07/19 0056 01/07/19 0928 01/08/19 0630  WBC 10.9* 10.7* 11.1* 15.3*  HGB 10.5* 10.6* 10.2* 9.6*  HCT 33.0* 34.5* 31.7* 30.3*  MCV 97.6 99.7 96.9 99.3  PLT 225 223 232 194   Basic Metabolic Panel: Recent Labs  Lab 01/06/19 1744 01/07/19 0056 01/07/19 0928 01/08/19 0630  NA 138 139 139 139  K 4.9 4.5 3.6 4.1  CL 107 108 106 106  CO2 16* 20* 22 21*  GLUCOSE 90 66* 106* 96  BUN 21 20 22 23   CREATININE 1.66* 1.63* 1.63* 2.04*  CALCIUM 7.9* 7.5* 7.4* 7.1*   GFR: Estimated Creatinine Clearance: 24.1 mL/min (Theran Vandergrift) (by C-G formula based on SCr of  2.04 mg/dL (H)). Liver Function Tests: Recent Labs  Lab 01/06/19 1744 01/07/19 0056 01/07/19 0928 01/08/19 0630  AST 96* 80* 72* 56*  ALT 20 16 16 13   ALKPHOS 258* 210* 198* 160*  BILITOT 2.6* 2.3* 2.1* 2.3*  PROT 5.8* 5.4* 4.9* 4.7*  ALBUMIN 2.2* 2.0* 1.9* 1.6*   No results for input(s): LIPASE, AMYLASE in the last 168 hours. No results for input(s): AMMONIA in the last 168 hours. Coagulation Profile: No results for input(s): INR, PROTIME in the last 168 hours. Cardiac Enzymes: No results for input(s): CKTOTAL, CKMB, CKMBINDEX, TROPONINI in the last 168 hours. BNP (last 3 results) No results for input(s): PROBNP in the last 8760 hours. HbA1C: No results for input(s): HGBA1C in the last 72 hours. CBG: Recent Labs  Lab 01/07/19 0216 01/07/19 0737 01/07/19 1341  01/07/19 2024 01/08/19 0741  GLUCAP 120* 85 97 117* 89   Lipid Profile: No results for input(s): CHOL, HDL, LDLCALC, TRIG, CHOLHDL, LDLDIRECT in the last 72 hours. Thyroid Function Tests: Recent Labs    01/07/19 0928  TSH 1.980   Anemia Panel: Recent Labs    01/07/19 0928  VITAMINB12 1,199*  FERRITIN 498*  TIBC NOT CALCULATED  IRON 81   Sepsis Labs: No results for input(s): PROCALCITON, LATICACIDVEN in the last 168 hours.  Recent Results (from the past 240 hour(s))  Stool Culture     Status: None   Collection Time: 12/31/18 12:43 PM   Specimen: Stool  Result Value Ref Range Status   MICRO NUMBER: [id number]  Final   SPECIMEN QUALITY: Adequate  Final   SOURCE: NOT GIVEN  Final   STATUS: FINAL  Final   SHIGA RESULT: Not Detected  Final   MICRO NUMBER: [id number]  Final   SPECIMEN QUALITY: Adequate  Final   Source NOT GIVEN  Final   STATUS: FINAL  Final   CAM RESULT: No enteric Campylobacter isolated  Final   MICRO NUMBER: [id number]  Final   SPECIMEN QUALITY: Adequate  Final   SOURCE: NOT GIVEN  Final   STATUS: FINAL  Final   SS RESULT: No Salmonella or Shigella isolated  Final  SARS Coronavirus 2 (CEPHEID - Performed in [facility] hospital lab), Hosp Order     Status: None   Collection Time: 01/06/19  9:23 PM   Specimen: Nasopharyngeal Swab  Result Value Ref Range Status   SARS Coronavirus 2 NEGATIVE NEGATIVE Final    Comment: (NOTE) If result is NEGATIVE SARS-CoV-2 target nucleic acids are NOT DETECTED. The SARS-CoV-2 RNA is generally detectable in upper and lower  respiratory specimens during the acute phase of infection. The lowest  concentration of SARS-CoV-2 viral copies this assay can detect is 250  copies / mL. Kerney Hopfensperger negative result does not preclude SARS-CoV-2 infection  and should not be used as the sole basis for treatment or other  patient management decisions.  Jillian Pianka negative result may occur with  improper specimen collection / handling, submission of  specimen other  than nasopharyngeal swab, presence of viral mutation(s) within the  areas targeted by this assay, and inadequate number of viral copies  (<250 copies / mL). Finnis Colee negative result must be combined with clinical  observations, patient history, and epidemiological information. If result is POSITIVE SARS-CoV-2 target nucleic acids are DETECTED. The SARS-CoV-2 RNA is generally detectable in upper and lower  respiratory specimens dur ing the acute phase of infection.  Positive  results are indicative of active infection with SARS-CoV-2.  Clinical  correlation with  patient history and other diagnostic information is  necessary to determine patient infection status.  Positive results do  not rule out bacterial infection or co-infection with other viruses. If result is PRESUMPTIVE POSTIVE SARS-CoV-2 nucleic acids MAY BE PRESENT.   Renold Kozar presumptive positive result was obtained on the submitted specimen  and confirmed on repeat testing.  While 2019 novel coronavirus  (SARS-CoV-2) nucleic acids may be present in the submitted sample  additional confirmatory testing may be necessary for epidemiological  and / or clinical management purposes  to differentiate between  SARS-CoV-2 and other Sarbecovirus currently known to infect humans.  If clinically indicated additional testing with an alternate test  methodology [phone]) is advised. The SARS-CoV-2 RNA is generally  detectable in upper and lower respiratory sp ecimens during the acute  phase of infection. The expected result is Negative. Fact Sheet for Patients:  BoilerBrush.com.cy Fact Sheet for Healthcare Providers: https://pope.com/ This test is not yet approved or cleared by the [place] FDA and has been authorized for detection and/or diagnosis of SARS-CoV-2 by FDA under an Emergency Use Authorization (EUA).  This EUA will remain in effect (meaning this test can be used) for the  duration of the COVID-19 declaration under Section 564(b)(1) of the Act, 21 U.S.C. section 360bbb-3(b)(1), unless the authorization is terminated or revoked sooner. Performed at [facility], [address], [place], [place] [address]   Culture, blood (routine x 2)     Status: None (Preliminary result)   Collection Time: 01/07/19  5:46 PM   Specimen: BLOOD LEFT HAND  Result Value Ref Range Status   Specimen Description   Final    BLOOD LEFT HAND Performed at [facility], [address] [place], [place] [address]    Special Requests   Final    BOTTLES DRAWN AEROBIC ONLY Blood Culture results may not be optimal due to an inadequate volume of blood received in culture bottles Performed at [facility], [address], [place], [place] [address]    Culture   Final    NO GROWTH < 24 HOURS Performed at [facility], [address], [place], [place] [address]    Report Status PENDING  Incomplete  Culture, blood (routine x 2)     Status: None (Preliminary result)   Collection Time: 01/07/19  5:46 PM   Specimen: BLOOD  Result Value Ref Range Status   Specimen Description   Final    BLOOD LEFT ANTECUBITAL Performed at [facility], [address] [place], [place] [address]    Special Requests   Final    BOTTLES DRAWN AEROBIC ONLY Blood Culture adequate volume Performed at [facility], [address] [place], [place] [address]    Culture   Final    NO GROWTH < 24 HOURS Performed at [facility], [address], [place], [place] [address]    Report Status PENDING  Incomplete         Radiology Studies: Dg Chest 2 View  Result Date: 01/08/2019 CLINICAL DATA:  Follow-up airspace disease EXAM: CHEST - 2 VIEW COMPARISON:  01/06/2019 FINDINGS: No significant change in low volume examination with small bilateral pleural effusions and associated atelectasis or consolidation,  these in keeping with both prior radiographs and CT/MRI. There is no new airspace opacity. IMPRESSION: No significant change in low volume examination with small bilateral pleural effusions and associated atelectasis or consolidation, these in keeping with both prior radiographs  and CT/MRI. There is no new airspace opacity. Electronically Signed   By: Lauralyn Primes M.D.   On: 01/08/2019 12:27   Mr Abdomen Mrcp Wo Contrast  Result Date: 01/07/2019 CLINICAL DATA:  Inpatient. Abnormal liver function tests. Abnormal appearance of the liver on CT study performed 1 day prior. Diarrhea, nausea, vomiting, bloody stools. EXAM: MRI ABDOMEN WITHOUT CONTRAST  (INCLUDING MRCP) TECHNIQUE: Multiplanar multisequence MR imaging of the abdomen was performed. Heavily T2-weighted images of the biliary and pancreatic ducts were obtained, and three-dimensional MRCP images were rendered by post processing. COMPARISON:  01/06/2019 CT abdomen/pelvis. FINDINGS: Lower chest: Small dependent bilateral pleural effusions, right greater than left. Hepatobiliary: Severe diffuse hepatic steatosis. Questionable fine liver surface irregularity. Normal liver size. There is Teja Costen 0.5 cm subcapsular T2 hyperintense T1 hypointense lesion in the inferior right liver lobe (series 2/image 41), incompletely characterized on this noncontrast MRI, probably Millicent Blazejewski simple cyst. No additional liver lesions. Normal gallbladder with no cholelithiasis. No biliary ductal dilatation. Common bile duct diameter 5 mm. No evidence of choledocholithiasis. No biliary strictures or beading. Pancreas: No pancreatic mass or duct dilation.  No pancreas divisum. Spleen: Normal size. No mass. Adrenals/Urinary Tract: Normal adrenals. No hydronephrosis. Normal kidneys with no renal mass. Stomach/Bowel: Normal non-distended stomach. Visualized small and large bowel is normal caliber, with no bowel wall thickening. Vascular/Lymphatic: Normal caliber abdominal aorta. No pathologically  enlarged lymph nodes in the abdomen. Other: Moderate volume ascites. No focal fluid collection. Mild anasarca. Musculoskeletal: No aggressive appearing focal osseous lesions. Mild T11, moderate T12 and mild L3 and L4 vertebral compression fractures of uncertain chronicity. IMPRESSION: 1. Severe diffuse hepatic steatosis. Questionable fine liver surface irregularity, cannot exclude hepatic cirrhosis. Solitary subcentimeter T2 hyperintense inferior right liver lesion, incompletely characterized on this noncontrast MRI study, probably Flornce Record benign cyst. Suggest follow-up MRI without and with IV contrast in 3-6 months. 2. No biliary ductal dilatation. No cholelithiasis or choledocholithiasis. 3. Small dependent bilateral pleural effusions, right greater than left. 4. Moderate volume ascites. 5. Mild anasarca. 6. Multilevel thoracolumbar vertebral compression fractures of uncertain chronicity. Electronically Signed   By: Delbert Phenix M.D.   On: 01/07/2019 12:20   Mr 3d Recon At Scanner  Result Date: 01/07/2019 CLINICAL DATA:  Inpatient. Abnormal liver function tests. Abnormal appearance of the liver on CT study performed 1 day prior. Diarrhea, nausea, vomiting, bloody stools. EXAM: MRI ABDOMEN WITHOUT CONTRAST  (INCLUDING MRCP) TECHNIQUE: Multiplanar multisequence MR imaging of the abdomen was performed. Heavily T2-weighted images of the biliary and pancreatic ducts were obtained, and three-dimensional MRCP images were rendered by post processing. COMPARISON:  01/06/2019 CT abdomen/pelvis. FINDINGS: Lower chest: Small dependent bilateral pleural effusions, right greater than left. Hepatobiliary: Severe diffuse hepatic steatosis. Questionable fine liver surface irregularity. Normal liver size. There is Simonne Boulos 0.5 cm subcapsular T2 hyperintense T1 hypointense lesion in the inferior right liver lobe (series 2/image 41), incompletely characterized on this noncontrast MRI, probably Rafeal Skibicki simple cyst. No additional liver lesions.  Normal gallbladder with no cholelithiasis. No biliary ductal dilatation. Common bile duct diameter 5 mm. No evidence of choledocholithiasis. No biliary strictures or beading. Pancreas: No pancreatic mass or duct dilation.  No pancreas divisum. Spleen: Normal size. No mass. Adrenals/Urinary Tract: Normal adrenals. No hydronephrosis. Normal kidneys with no renal mass. Stomach/Bowel: Normal non-distended stomach. Visualized small and large bowel is normal caliber, with no bowel wall thickening. Vascular/Lymphatic: Normal caliber abdominal aorta. No pathologically enlarged lymph nodes in the abdomen. Other: Moderate volume ascites. No focal fluid collection. Mild anasarca.  Musculoskeletal: No aggressive appearing focal osseous lesions. Mild T11, moderate T12 and mild L3 and L4 vertebral compression fractures of uncertain chronicity. IMPRESSION: 1. Severe diffuse hepatic steatosis. Questionable fine liver surface irregularity, cannot exclude hepatic cirrhosis. Solitary subcentimeter T2 hyperintense inferior right liver lesion, incompletely characterized on this noncontrast MRI study, probably Leoni Goodness benign cyst. Suggest follow-up MRI without and with IV contrast in 3-6 months. 2. No biliary ductal dilatation. No cholelithiasis or choledocholithiasis. 3. Small dependent bilateral pleural effusions, right greater than left. 4. Moderate volume ascites. 5. Mild anasarca. 6. Multilevel thoracolumbar vertebral compression fractures of uncertain chronicity. Electronically Signed   By: Delbert Phenix M.D.   On: 01/07/2019 12:20   Dg Chest Port 1 View  Result Date: 01/06/2019 CLINICAL DATA:  CT abdomen pelvis 01/06/2019. EXAM: PORTABLE CHEST 1 VIEW COMPARISON:  Same-day CT abdomen and pelvis, chest radiograph 07/05/2018. FINDINGS: Corresponding well to the CT abnormality is Therasa Lorenzi patchy region of airspace disease in the left lung base. The 4 mm nodule seen on CT is not radiographically apparent. Small right pleural effusion is present.  No pneumothorax. Patient is rotated left anterior oblique superimposing much the cardiac silhouette over the right chest. Cardiomediastinal contours are otherwise unremarkable. No acute osseous or soft tissue abnormality. IMPRESSION: Patchy airspace disease in left lung base could reflect early pneumonia or sequela of aspiration in the appropriate setting. Electronically Signed   By: Kreg Shropshire M.D.   On: 01/06/2019 22:36   Vas [place] Lower Extremity Venous (dvt)  Result Date: 01/08/2019  Lower Venous Study Indications: Edema.  Limitations: Bruising in the lower leg and edema. Performing Technologist: Toma Deiters RVS  Examination Guidelines: Amire Gossen complete evaluation includes B-mode imaging, spectral Doppler, color Doppler, and power Doppler as needed of all accessible portions of each vessel. Bilateral testing is considered an integral part of Melea Prezioso complete examination. Limited examinations for reoccurring indications may be performed as noted.  +-----+---------------+---------+-----------+----------+-------+  RIGHT Compressibility Phasicity Spontaneity Properties Summary  +-----+---------------+---------+-----------+----------+-------+  CFV   Full            Yes       Yes                             +-----+---------------+---------+-----------+----------+-------+  SFJ   Full                                                      +-----+---------------+---------+-----------+----------+-------+   +---------+---------------+---------+-----------+----------+-------------------+  LEFT      Compressibility Phasicity Spontaneity Properties Summary              +---------+---------------+---------+-----------+----------+-------------------+  CFV       Full                                                                  +---------+---------------+---------+-----------+----------+-------------------+  SFJ       Full                                                                   +---------+---------------+---------+-----------+----------+-------------------+  FV Prox   Full                                                                  +---------+---------------+---------+-----------+----------+-------------------+  FV Mid    Full                                                                  +---------+---------------+---------+-----------+----------+-------------------+  FV Distal Full                                                                  +---------+---------------+---------+-----------+----------+-------------------+  PFV       Full                                                                  +---------+---------------+---------+-----------+----------+-------------------+  POP       Full                                                                  +---------+---------------+---------+-----------+----------+-------------------+  PTV       Full                                             Difficult to image                                                               due to above                                                                     mentioned  limitations          +---------+---------------+---------+-----------+----------+-------------------+  PERO      Full                                             Difficult to image                                                               due to above                                                                     mentioned                                                                        limitations          +---------+---------------+---------+-----------+----------+-------------------+     Summary: Right: There is no evidence of Shian Goodnow right common femoral vein obstruction. Left: There is no evidence of deep vein thrombosis in the lower extremity. No cystic structure found in the popliteal fossa.  *See table(s) above for measurements  and observations.    Preliminary         Scheduled Meds:  bisacodyl  20 mg Oral Once   calcium carbonate  500 mg of elemental calcium Oral BID   And   cholecalciferol  200 Units Oral BID   escitalopram  10 mg Oral Daily   feeding supplement (PRO-STAT SUGAR FREE 64)  30 mL Oral BID   levothyroxine  88 mcg Oral Q0600   metoCLOPramide (REGLAN) injection  10 mg Intravenous Once   metoCLOPramide (REGLAN) injection  10 mg Intravenous Once   polyethylene glycol powder  0.5 Container Oral Once   polyethylene glycol powder  0.5 Container Oral Once   potassium chloride  10 mEq Oral Daily   sodium bicarbonate  650 mg Oral BID   Continuous Infusions:  azithromycin 500 mg (01/07/19 1821)   cefTRIAXone (ROCEPHIN)  IV Stopped (01/07/19 1725)   lactated ringers       LOS: 1 day    Time spent: over 30 min    Lacretia Nicks, MD [facility] Pager AMION  If 7PM-7AM, please contact night-coverage [email or web address] Password [facility] 01/08/2019, 4:16 PM

## 2019-01-08 NOTE — Progress Notes (Addendum)
   Patient Name: Kaylee Rasmussen Date of Encounter: 01/08/2019, 9:15 AM    Subjective  Feels stronger Ready to do a colonoscopy   Objective  BP (!) 94/55 (BP Location: Left Arm)   Pulse 63   Temp 98.1 F (36.7 C)   Resp 16   Ht '5\' 1"'$  (1.549 m)   Wt 68.9 kg   SpO2 (!) 88%   BMI 28.68 kg/m  Chronically ill A bit more vigorous Lungs clr ant Ht s1s2 abd soft NT  Lab Results  Component Value Date   WBC 15.3 (H) 01/08/2019   HGB 9.6 (L) 01/08/2019   HCT 30.3 (L) 01/08/2019   MCV 99.3 01/08/2019   PLT 194 01/08/2019   Lab Results  Component Value Date   ALT 13 01/08/2019   AST 56 (H) 01/08/2019   ALKPHOS 160 (H) 01/08/2019   BILITOT 2.3 (H) 01/08/2019   Prealbumin 5! TSH NL ESR 18 Lab Results  Component Value Date   VITAMINB12 1,199 (H) 01/07/2019   Lab Results  Component Value Date   FERRITIN 498 (H) 01/07/2019     Assessment and Plan  Diarrhea Rectal bleeding/hematochezia Past cecal CMV ulcer 06/2018 Severe protein calorie malnutrition Abnl LFT's RA   Colonoscopy tomorrow F/U Labs Dietitian consult if not done PA/Lat CXR Gatha Mayer, MD, [facility] 01/08/2019 9:15 AM Pager [phone]

## 2019-01-08 NOTE — Anesthesia Preprocedure Evaluation (Addendum)
Anesthesia Evaluation  Patient identified by MRN, date of birth, ID band Patient awake    Reviewed: Allergy & Precautions, Patient's Chart, lab work & pertinent test results, reviewed documented beta blocker date and time   Airway Mallampati: II  TM Distance: >3 FB Neck ROM: Full    Dental  (+) Dental Advisory Given, Teeth Intact   Pulmonary asthma , COPD,    Pulmonary exam normal breath sounds clear to auscultation       Cardiovascular hypertension, Pt. on medications and Pt. on home beta blockers Normal cardiovascular exam Rhythm:Regular Rate:Normal     Neuro/Psych negative neurological ROS     GI/Hepatic negative GI ROS, Neg liver ROS,   Endo/Other  negative endocrine ROS  Renal/GU Renal disease     Musculoskeletal  (+) Arthritis ,   Abdominal   Peds  Hematology negative hematology ROS (+)   Anesthesia Other Findings Day of surgery medications reviewed with the patient.  Reproductive/Obstetrics negative OB ROS                            Anesthesia Physical Anesthesia Plan  ASA: III  Anesthesia Plan: MAC   Post-op Pain Management:    Induction: Intravenous  PONV Risk Score and Plan: 2 and Propofol infusion, Ondansetron and Treatment may vary due to age or medical condition  Airway Management Planned:   Additional Equipment:   Intra-op Plan:   Post-operative Plan:   Informed Consent: I have reviewed the patients History and Physical, chart, labs and discussed the procedure including the risks, benefits and alternatives for the proposed anesthesia with the patient or authorized representative who has indicated his/her understanding and acceptance.     Dental advisory given  Plan Discussed with: CRNA  Anesthesia Plan Comments:         Anesthesia Quick Evaluation

## 2019-01-08 NOTE — Progress Notes (Signed)
Left lower extremity venous duplex completed. Preliminary results in Chart review CV Proc. [place] Amayia Ciano,RVS 01/08/2019, 12:25 PM

## 2019-01-08 NOTE — Evaluation (Signed)
Clinical/Bedside Swallow Evaluation Patient Details  Name: Kaylee Rasmussen MRN: [id number] Date of Birth: 10/03/1950  Today's Date: 01/08/2019 Time: SLP Start Time (ACUTE ONLY): 7209 SLP Stop Time (ACUTE ONLY): 1530 SLP Time Calculation (min) (ACUTE ONLY): 14 min  Past Medical History:  Past Medical History:  Diagnosis Date  . Asthma   . COPD (chronic obstructive pulmonary disease) ([place]) 07/13/2018   on CT chest at [facility]  . Esophagitis, CMV (cytomegalovirus) ([place]) 07/08/2018  . Herpes simplex esophagitis 07/08/2018  . High cholesterol   . Hypertension   . Pancytopenia ([place])    secondary to cyclophosphamide  . Rheumatoid arthritis ([place])   . Severe protein-calorie malnutrition ([place]) 01/08/2019  . Vertigo    Past Surgical History:  Past Surgical History:  Procedure Laterality Date  . ABDOMINAL HYSTERECTOMY    . HIP ARTHROPLASTY    . Lumbarectomy    . RENAL BIOPSY  03/24/2018   HPI:  68 y.o. female with h/o  hypertension, hyperlipidemia, Asthma, Rheumatoid arthritis (formerly on humira but did not tolerate -> CMV colitis, HSV esophagitis February 2020), h/o necrotizing glomerulonephritis/ vasculitis(+ ANCA),  pancytopenia secondary to cyclophosphamide, chronic diarrhea and some intermittent n/v.   She admits to significant weight loss stating she "gets full fast" with resultant bloating and belching and requires an hour to eat a meal as she "takes it slow".  Pt denies oropharyngeal deficits nor neurological diagnosis. Admits her voice is weaker than normal.  Admits she was to start on medication per Dr Lyndel Safe via telehealth - Per review of notes it was a two week supply of Reglan.  Pt is reticent to take Reglan due to side effects, advised she speak to MD re: these concerns.    She states she took a PPI before but it caused her more nausea.  Swallow eval ordered by Dr Florene Glen, pt is for colonoscopy tomorrow with Dr Carlean Purl.    Pt says she wants to eat and she "dreams of food" but her  bloating prevents her from consuming larger amounts.  She also states she gets nauseated when she exerts herself - mostly at night when getting ready for bed and states her stomach is empty by then.     Assessment / Plan / Recommendation Clinical Impression  Patient presents with functional oropharyngeal swallow ability based on limited clinical swallow evaluation.  She does have h/o RA but this is not impacting her pharyngeal swallow/arytenoid closure based on her report and evaluation.  With limited intake, pt's swallow appeared timely with clear voice and no indication of pharyngeal residuals.  Did not conduct 3 ounce water test due to pt's severity of bloating with intake and clinical evaluation provided necessary information.  Recommend diet as per GI as pt's symptoms appear to be GI related- pt for colonoscopy tomorrow. No SLP follow up indicated.  Thanks.   SLP Visit Diagnosis: Dysphagia, unspecified (R13.10)    Aspiration Risk  No limitations    Diet Recommendation Other (Comment)(clears per GI - defer to GI post-procedure)   Medication Administration: Whole meds with liquid Supervision: Patient able to self feed Compensations: Slow rate;Small sips/bites Postural Changes: Seated upright at 90 degrees;Remain upright for at least 30 minutes after po intake    Other  Recommendations Oral Care Recommendations: Oral care BID   Follow up Recommendations None      Frequency and Duration     n/a       Prognosis   n/a     Swallow Study   General  Date of Onset: 01/08/19 HPI: 68 y.o. female with h/o  hypertension, hyperlipidemia, Asthma, Rheumatoid arthritis (formerly on humira but did not tolerate -> CMV colitis, HSV esophagitis February 2020), h/o necrotizing glomerulonephritis/ vasculitis(+ ANCA),  pancytopenia secondary to cyclophosphamide, chronic diarrhea and some intermittent n/v.   She admits to significant weight loss stating she "gets full fast" with resultant bloating and  belching and requires an hour to eat a meal as she "takes it slow".  Pt denies oropharyngeal deficits nor neurological diagnosis. Admits her voice is weaker than normal.  Admits she was to start on medication per Dr Lyndel Safe via telehealth - Per review of notes it was a two week supply of Reglan.  Pt is reticent to take Reglan due to side effects, advised she speak to MD re: these concerns.    She states she took a PPI before but it caused her more nausea.  Swallow eval ordered by Dr Florene Glen, pt is for colonoscopy tomorrow with Dr Carlean Purl. Type of Study: Bedside Swallow Evaluation Previous Swallow Assessment: none - endoscopies completed previously - see MD notes Diet Prior to this Study: Thin liquids(clears) Temperature Spikes Noted: No Respiratory Status: Nasal cannula History of Recent Intubation: No Behavior/Cognition: Alert;Cooperative;Pleasant mood Oral Cavity Assessment: Within Functional Limits Oral Care Completed by SLP: No Oral Cavity - Dentition: Adequate natural dentition Vision: Functional for self-feeding Self-Feeding Abilities: Able to feed self Patient Positioning: Upright in bed Baseline Vocal Quality: Low vocal intensity Volitional Cough: Strong Volitional Swallow: Able to elicit    Oral/Motor/Sensory Function Overall Oral Motor/Sensory Function: Within functional limits   Ice Chips Ice chips: Not tested   Thin Liquid Thin Liquid: Within functional limits Presentation: Cup;Self Fed    Nectar Thick Nectar Thick Liquid: Not tested   Honey Thick Honey Thick Liquid: Not tested   Puree Puree: Not tested   Solid     Solid: Not tested     Luanna Salk, MS [facility] SLP Acute Rehab Services Pager [phone] Office [phone]  Macario Golds 01/08/2019,3:46 PM

## 2019-01-09 ENCOUNTER — Encounter (HOSPITAL_COMMUNITY): Payer: PPO

## 2019-01-09 ENCOUNTER — Encounter (HOSPITAL_COMMUNITY): Payer: Self-pay | Admitting: Emergency Medicine

## 2019-01-09 ENCOUNTER — Inpatient Hospital Stay (HOSPITAL_COMMUNITY): Payer: PPO | Admitting: Anesthesiology

## 2019-01-09 ENCOUNTER — Encounter (HOSPITAL_COMMUNITY)
Admission: EM | Disposition: A | Discharge status: Hospice | Payer: Self-pay | Source: Home / Self Care | Attending: Internal Medicine

## 2019-01-09 DIAGNOSIS — K625 Hemorrhage of anus and rectum: Secondary | ICD-10-CM

## 2019-01-09 HISTORY — PX: BIOPSY: SHX5522

## 2019-01-09 HISTORY — PX: COLONOSCOPY WITH PROPOFOL: SHX5780

## 2019-01-09 LAB — MAGNESIUM: Magnesium: 1.2 mg/dL — ABNORMAL LOW (ref 1.7–2.4)

## 2019-01-09 LAB — COMPREHENSIVE METABOLIC PANEL
ALT: 12 U/L (ref 0–44)
AST: 57 U/L — ABNORMAL HIGH (ref 15–41)
Albumin: 1.5 g/dL — ABNORMAL LOW (ref 3.5–5.0)
Alkaline Phosphatase: 160 U/L — ABNORMAL HIGH (ref 38–126)
Anion gap: 15 (ref 5–15)
BUN: 24 mg/dL — ABNORMAL HIGH (ref 8–23)
CO2: 17 mmol/L — ABNORMAL LOW (ref 22–32)
Calcium: 7.3 mg/dL — ABNORMAL LOW (ref 8.9–10.3)
Chloride: 109 mmol/L (ref 98–111)
Creatinine, Ser: 2.18 mg/dL — ABNORMAL HIGH (ref 0.44–1.00)
GFR calc Af Amer: 26 mL/min — ABNORMAL LOW (ref >60)
GFR calc non Af Amer: 23 mL/min — ABNORMAL LOW (ref >60)
Glucose, Bld: 64 mg/dL — ABNORMAL LOW (ref 70–99)
Potassium: 4.1 mmol/L (ref 3.5–5.1)
Sodium: 141 mmol/L (ref 135–145)
Total Bilirubin: 1.9 mg/dL — ABNORMAL HIGH (ref 0.3–1.2)
Total Protein: 4.3 g/dL — ABNORMAL LOW (ref 6.5–8.1)

## 2019-01-09 LAB — CBC
HCT: 31 % — ABNORMAL LOW (ref 36.0–46.0)
Hemoglobin: 9.8 g/dL — ABNORMAL LOW (ref 12.0–15.0)
MCH: 30.3 pg (ref 26.0–34.0)
MCHC: 31.6 g/dL (ref 30.0–36.0)
MCV: 96 fL (ref 80.0–100.0)
Platelets: 181 10*3/uL (ref 150–400)
RBC: 3.23 MIL/uL — ABNORMAL LOW (ref 3.87–5.11)
RDW: 17.7 % — ABNORMAL HIGH (ref 11.5–15.5)
WBC: 15.8 10*3/uL — ABNORMAL HIGH (ref 4.0–10.5)
nRBC: 0 % (ref 0.0–0.2)

## 2019-01-09 LAB — GLUCOSE, CAPILLARY: Glucose-Capillary: 88 mg/dL (ref 70–99)

## 2019-01-09 SURGERY — COLONOSCOPY WITH PROPOFOL
Anesthesia: Monitor Anesthesia Care

## 2019-01-09 MED ORDER — MAGNESIUM SULFATE 4 GM/100ML IV SOLN: 4.0000 g | Freq: Once | INTRAVENOUS | Status: DC

## 2019-01-09 MED ORDER — ENSURE ENLIVE PO LIQD
237.0000 mL | ORAL | Status: DC
  Administered 2019-01-12 – 2019-01-13 (×2): 237 mL via ORAL

## 2019-01-09 MED ORDER — SODIUM CHLORIDE 0.9% FLUSH
10.0000 mL | INTRAVENOUS | Status: DC | PRN
  Administered 2019-01-13 – 2019-02-10 (×4): 10 mL
  Filled 2019-01-09 (×4): qty 40

## 2019-01-09 MED ORDER — SPOT INK MARKER SYRINGE KIT
PACK | SUBMUCOSAL | Status: AC
  Filled 2019-01-09: qty 5

## 2019-01-09 MED ORDER — EPINEPHRINE 1 MG/10ML IJ SOSY
PREFILLED_SYRINGE | INTRAMUSCULAR | Status: AC
  Filled 2019-01-09: qty 10

## 2019-01-09 MED ORDER — ADULT MULTIVITAMIN W/MINERALS CH
1.0000 | ORAL_TABLET | Freq: Every day | ORAL | Status: DC
  Administered 2019-01-09 – 2019-01-23 (×14): 1 via ORAL
  Filled 2019-01-09 (×17): qty 1

## 2019-01-09 MED ORDER — MAGNESIUM SULFATE 2 GM/50ML IV SOLN
2.0000 g | Freq: Once | INTRAVENOUS | Status: AC
  Administered 2019-01-09: 2 g via INTRAVENOUS
  Filled 2019-01-09: qty 50

## 2019-01-09 MED ORDER — PROPOFOL 500 MG/50ML IV EMUL
INTRAVENOUS | Status: DC | PRN
  Administered 2019-01-09: 150 ug/kg/min via INTRAVENOUS

## 2019-01-09 MED ORDER — LACTATED RINGERS IV BOLUS
500.0000 mL | Freq: Once | INTRAVENOUS | Status: AC
  Administered 2019-01-09: 500 mL via INTRAVENOUS

## 2019-01-09 MED ORDER — PHENYLEPHRINE HCL (PRESSORS) 10 MG/ML IV SOLN
INTRAVENOUS | Status: DC | PRN
  Administered 2019-01-09: 80 ug via INTRAVENOUS

## 2019-01-09 MED ORDER — SODIUM CHLORIDE 0.9 % IV SOLN: INTRAVENOUS | Status: DC

## 2019-01-09 MED ORDER — BOOST / RESOURCE BREEZE PO LIQD CUSTOM
1.0000 | Freq: Two times a day (BID) | ORAL | Status: DC
  Administered 2019-01-11: 1 via ORAL

## 2019-01-09 MED ORDER — EPHEDRINE SULFATE 50 MG/ML IJ SOLN
INTRAMUSCULAR | Status: DC | PRN
  Administered 2019-01-09: 10 mg via INTRAVENOUS

## 2019-01-09 MED ORDER — PROPOFOL 10 MG/ML IV BOLUS
INTRAVENOUS | Status: AC
  Filled 2019-01-09: qty 40

## 2019-01-09 MED ORDER — LIDOCAINE HCL (CARDIAC) PF 100 MG/5ML IV SOSY
PREFILLED_SYRINGE | INTRAVENOUS | Status: DC | PRN
  Administered 2019-01-09: 50 mg via INTRAVENOUS

## 2019-01-09 SURGICAL SUPPLY — 22 items

## 2019-01-09 NOTE — Interval H&P Note (Signed)
History and Physical Interval Note:  01/09/2019 8:01 AM  Kaylee Rasmussen  has presented today for surgery, with the diagnosis of diarrhea and rectal bleeding.  The various methods of treatment have been discussed with the patient and family. After consideration of risks, benefits and other options for treatment, the patient has consented to  Procedure(s): COLONOSCOPY WITH PROPOFOL (N/A) as a surgical intervention.  The patient's history has been reviewed, patient examined, no change in status, stable for surgery.  I have reviewed the patient's chart and labs.  Questions were answered to the patient's satisfaction.     Silvano Rusk

## 2019-01-09 NOTE — Progress Notes (Signed)
PROGRESS NOTE    Kaylee Rasmussen  OZH:[id number] DOB: [birth year] DOA: 01/06/2019 PCP: Nicoletta Dress, MD   Brief Narrative:  Kaylee Rasmussen  is Kaylee Rasmussen 68 y.o. 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 Bx-CMV colitis. No malignancy. Records scanned in "MEDIA TAB".EGD showed LA grade D reflux esophagitis, gastritis. -Colonoscopy 08/12/2017-small colonic polyp, moderate colonic diverticulosis  Reviewed CT scan Abd/ pelvis  IMPRESSION: 1. Abnormal appearance of the liver with heterogeneous hepatic steatosis and heterogeneous liver enhancement. Questionable fine liver surface irregularity. Cannot exclude hepatic cirrhosis. No discrete liver masses. Recommend correlation with liver enzymes. 2. Third-spacing of fluid with moderate ascites, small dependent bilateral pleural effusions, right greater than left, and mild anasarca. 3. No evidence of bowel obstruction or acute bowel inflammation. Mild colonic diverticulosis, without convincing findings of acute diverticulitis. 4. Patchy bandlike consolidation in the left lung base, cannot exclude pneumonia or aspiration. 5. Left lung base 4 mm solid pulmonary nodule. No follow-up needed if patient is low-risk. Non-contrast chest CT can be considered in 12 months if patient is high-risk.    In ED,  T 98.5  P 67 R 16  Bp 80/58  Pox 97% on RA Wt 63.5 kg  Current BP 121/58  Na  138, K 4.9,  Bun 21, Creatinine 1.66 Hco3 16, AG 15 Alb 2.2 Ast 96, Alt 20, Alk phos 258, T. Bili 2.6  Wbc 10.9, hgb 10.5, Plt 225 Type and screen done  FOBT negative  covid -19 negative  Pt will be admitted for c/o rectal bleeding , and chronic diarrhea, and non-AG acidosis secondary to diarrhea, and abnormal liver function.    Assessment & Plan:   Principal Problem:   ARF (acute renal failure) (HCC) Active Problems:   Rheumatoid arthritis involving multiple sites with positive rheumatoid factor (HCC)   History of hypertension   History of hypothyroidism   Abnormal liver function   Diarrhea   Pulmonary nodule   Metabolic acidosis   Severe protein-calorie malnutrition (HCC)   Rectal bleeding  Blood in stool GI consult, appreciate recommendations Colonoscopy with localized inflammation at ileocecal valve, external and internal hemorrhoids - see report - bx taken ? Recurrent/persistent CMV  Follow CMV dna by pcr (pending)  Abnormal liver function  Check GGT (elevated) Check ferritin (elevated), iron (wnl), tibc (not calc), ceruloplasmin (wnl), alpha 1 antitripsin (wnl), ana (negative), antismooth muscle ab (pending), check anti mitochondrial antibody (pending) Check MRCP -> with severe diffuse hepatic steatosis (can't exclude cirrhosis) - solitary subcentimeter R liver lesion, likely benign cyst -> recommend f/u MRI with and without IV contrast in 3-6 months  Chronic diarrhea Check celiac panel (gliadin antibodies - pending, TTG - normal, and reticulin antibody pending) Cholestyramine prn Appreciate GI recs  Community Acquired Pneumonia  Fever:  Follow blood cx CXR with patchy airspace dz in L lung base (early pneumonia vs sequelae of aspiration) Repeat 2 view CXR with small bilateral effusions and associated atelectasis vs consolidation Follow speech recs Ceftriaxone/azithro  Hypotension: improved, continue holding BP meds.   Right Greater than  Left  Effusions  Moderate Volume Ascites:  Continue to monitor for now Consider paracentesis prior to d/c  Non- AG acidosis Improved, d/c IV bicarb Pt on PO bicarb  AKI on CKD III: creatinine worsened today in setting of soft BP's on 7/18.  Continue IVF.  baseline creatinine appears to be ~1.5, continue to monitor Follow urinalysis (pending collection) No hydro on imaging from earlier in hospitalization  N/v Zofran 39m iv q6h prn   Hypertension Hold antihypertensives below with hypotension this AM Cont Amlodipine 2.574mpo qday Cont Toprol XL 10066mo qday  Hypothyroidism Cont Levothyroxine 88 micrograms po qday  Anxiety Cont Lexapro 4m59m qday  Anemia Check cbc in am  RA Off Humira, follows with Deveshwar Voltaren gel prn   Pulmonary Nodule: outpatient follow up  Right greater than Left LEE:  RLE without DVT.  DVT prophylaxis: SCD Code Status: full  Family Communication: none at bedside Disposition Plan: pending   Consultants:   GI  Procedures:  Colonoscopy Impression - Hemorrhoids found on perianal exam. - Perianal skin tags found on perianal exam. - Localized inflammation was found at the ileocecal valve. Biopsied. - The examined portion of the ileum was normal. - Kaylee Rasmussen tattoo was seen in the ascending colon, in the proximal ascending colon and in the cecum. - Diverticulosis in the left colon. - External and internal hemorrhoids. - The examination was otherwise normal on direct and retroflexion views. - Biopsies were taken with Kaylee Rasmussen cold forceps from the right colon and left colon for evaluation of microscopic colitis. Recommendation - Return patient to hospital ward for ongoing care. - THIS COULD BE SCARRING VS PERSISTENT/RECURRENT CMV AWAIT BXS, CMV PCR HYPOALBUMINEMIA CAN CONTRIBUTE TO/CAUSE DIARRHEA HEMORRHOIDS ARE SOURCE OF BLEEDING I SUSPECT FULL LIQUIDS AND ADVANCE AS TOLERATED WE WILL SEE AGAIN TOMORROW  Antimicrobials:  Anti-infectives  (From admission, onward)   Start     Dose/Rate Route Frequency Ordered Stop   01/07/19 1700  cefTRIAXone (ROCEPHIN) 2 g in sodium chloride 0.9 % 100 mL IVPB     2 g 200 mL/hr over 30 Minutes Intravenous Every 24 hours 01/07/19 1557 01/12/19 1659   01/07/19 1700  azithromycin (ZITHROMAX) 500 mg in sodium chloride 0.9 % 250 mL IVPB     500 mg 250 mL/hr over 60 Minutes Intravenous Every 24 hours 01/07/19 1557 01/12/19 1659     Subjective: Feels ok.  About the same.  Objective: Vitals:   01/09/19 0731 01/09/19 0842 01/09/19 0901 01/09/19 0915  BP: (!) 101/34 (!) 95/48 (!) 108/49 (!) 104/52  Pulse: 79 71 74 77  Resp: 16 20 (!) 25   Temp: 98.4 F (36.9 C) 97.7 F (36.5 C)  97.7 F (36.5 C)  TempSrc: Oral Oral  Oral  SpO2: 91% 97% 92% 100%  Weight: 71.9 kg     Height: _0  (1.549 m)       Intake/Output Summary (Last 24 hours) at 01/09/2019 1407 Last data filed at 01/09/2019 0900 Gross per 24 hour  Intake 3073.37 ml  Output -  Net 3073.37 ml   Filed Weights   01/08/19 0523 01/09/19 0711 01/09/19 0731  Weight: 68.9 kg 71.9 kg 71.9 kg    Examination:  General: No acute distress. Cardiovascular: Heart sounds show Kaylee Rasmussen regular rate, and rhythm.  Lungs: Clear to auscultation bilaterally  Abdomen: Soft, nontender, nondistended  Neurological: Alert and oriented 3. Moves all extremities 4. Cranial nerves II through XII grossly intact. Skin: Warm and dry. No rashes or lesions. Extremities:  No clubbing or cyanosis. No edema.   Data Reviewed: I have personally reviewed following labs and imaging studies  CBC: Recent Labs  Lab 01/06/19 1759 01/07/19 0056 01/07/19 0928 01/08/19 0630 01/09/19 0559  WBC 10.9* 10.7* 11.1* 15.3* 15.8*  HGB 10.5* 10.6* 10.2* 9.6* 9.8*  HCT 33.0* 34.5* 31.7* 30.3* 31.0*  MCV 97.6 99.7 96.9 99.3 96.0  PLT 225 223 232 194 132   Basic Metabolic Panel: Recent Labs  Lab 01/06/19 1744 01/07/19 0056 01/07/19 0928 01/08/19 0630 01/09/19 0559  NA  138 139 139 139 141  K 4.9 4.5 3.6 4.1 4.1  CL 107 108 106 106 109  CO2 16* 20* 22 21* 17*  GLUCOSE 90 66* 106* 96 64*  BUN _0 24*  CREATININE 1.66* 1.63* 1.63* 2.04* 2.18*  CALCIUM 7.9* 7.5* 7.4* 7.1* 7.3*  MG  --   --   --   --  1.2*   GFR: Estimated Creatinine Clearance: 23 mL/min (Kaylee Rasmussen) (by C-G formula based on SCr of 2.18 mg/dL (H)). Liver Function Tests: Recent Labs  Lab 01/06/19 1744 01/07/19 0056 01/07/19 0928 01/08/19 0630 01/09/19 0559  AST 96* 80* 72* 56* 57*  ALT _1 ALKPHOS 258* 210* 198* 160* 160*  BILITOT 2.6* 2.3* 2.1* 2.3* 1.9*  PROT 5.8* 5.4* 4.9* 4.7* 4.3*  ALBUMIN 2.2* 2.0* 1.9* 1.6* 1.5*   No results for input(s): LIPASE, AMYLASE in the last 168 hours. No results for input(s): AMMONIA in the last 168 hours. Coagulation Profile: No results for input(s): INR, PROTIME in the last 168 hours. Cardiac Enzymes: No results for input(s): CKTOTAL, CKMB, CKMBINDEX, TROPONINI in the last 168 hours. BNP (last 3 results) No results for input(s): PROBNP in the last 8760 hours. HbA1C: No results for input(s): HGBA1C in the last 72 hours. CBG: Recent Labs  Lab 01/07/19 1341 01/07/19 2024 01/08/19 0741 01/08/19 1627 01/09/19 0045  GLUCAP 97 117* 89 72 88   Lipid Profile: No results for input(s): CHOL, HDL, LDLCALC, TRIG, CHOLHDL, LDLDIRECT in the last 72 hours. Thyroid Function Tests: Recent Labs    01/07/19 0928  TSH 1.980   Anemia Panel: Recent Labs    01/07/19 0928  VITAMINB12 1,199*  FERRITIN 498*  TIBC NOT CALCULATED  IRON 81   Sepsis Labs: No results for input(s): PROCALCITON, LATICACIDVEN in the last 168 hours.  Recent Results (from the past 240 hour(s))  Stool Culture     Status: None   Collection Time: 12/31/18 12:43 PM   Specimen: Stool  Result Value Ref Range Status   MICRO NUMBER: [id number]  Final   SPECIMEN QUALITY: Adequate  Final   SOURCE: NOT GIVEN  Final   STATUS: FINAL  Final   SHIGA RESULT: Not Detected   Final   MICRO NUMBER: [id number]  Final   SPECIMEN QUALITY: Adequate  Final   Source NOT GIVEN  Final   STATUS: FINAL  Final   CAM RESULT: No enteric Campylobacter isolated  Final   MICRO NUMBER: [id number]  Final   SPECIMEN QUALITY: Adequate  Final   SOURCE: NOT GIVEN  Final   STATUS: FINAL  Final   SS RESULT: No Salmonella or Shigella isolated  Final  SARS Coronavirus 2 (CEPHEID - Performed in [place] hospital lab), Hosp Order     Status: None   Collection Time: 01/06/19  9:23 PM   Specimen: Nasopharyngeal Swab  Result Value Ref Range Status   SARS Coronavirus 2 NEGATIVE NEGATIVE Final  Comment: (NOTE) If result is NEGATIVE SARS-CoV-2 target nucleic acids are NOT DETECTED. The SARS-CoV-2 RNA is generally detectable in upper and lower  respiratory specimens during the acute phase of infection. The lowest  concentration of SARS-CoV-2 viral copies this assay can detect is 250  copies / mL. Kaylee Rasmussen negative result does not preclude SARS-CoV-2 infection  and should not be used as the sole basis for treatment or other  patient management decisions.  Kaylee Rasmussen negative result may occur with  improper specimen collection / handling, submission of specimen other  than nasopharyngeal swab, presence of viral mutation(s) within the  areas targeted by this assay, and inadequate number of viral copies  (<250 copies / mL). Kaylee Rasmussen negative result must be combined with clinical  observations, patient history, and epidemiological information. If result is POSITIVE SARS-CoV-2 target nucleic acids are DETECTED. The SARS-CoV-2 RNA is generally detectable in upper and lower  respiratory specimens dur ing the acute phase of infection.  Positive  results are indicative of active infection with SARS-CoV-2.  Clinical  correlation with patient history and other diagnostic information is  necessary to determine patient infection status.  Positive results do  not rule out bacterial infection or co-infection with other  viruses. If result is PRESUMPTIVE POSTIVE SARS-CoV-2 nucleic acids MAY BE PRESENT.   Kaylee Rasmussen presumptive positive result was obtained on the submitted specimen  and confirmed on repeat testing.  While 2019 novel coronavirus  (SARS-CoV-2) nucleic acids may be present in the submitted sample  additional confirmatory testing may be necessary for epidemiological  and / or clinical management purposes  to differentiate between  SARS-CoV-2 and other Sarbecovirus currently known to infect humans.  If clinically indicated additional testing with Rasmussen alternate test  methodology [phone]) is advised. The SARS-CoV-2 RNA is generally  detectable in upper and lower respiratory sp ecimens during the acute  phase of infection. The expected result is Negative. Fact Sheet for Patients:  StrictlyIdeas.no Fact Sheet for Healthcare Providers: BankingDealers.co.za This test is not yet approved or cleared by the [place] FDA and has been authorized for detection and/or diagnosis of SARS-CoV-2 by FDA under Rasmussen Emergency Use Authorization (EUA).  This EUA will remain in effect (meaning this test can be used) for the duration of the COVID-19 declaration under Section 564(b)(1) of the Act, 21 U.S.C. section 360bbb-3(b)(1), unless the authorization is terminated or revoked sooner. Performed at [facility], [place] [address], [place], [place] [address]   Culture, blood (routine x 2)     Status: None (Preliminary result)   Collection Time: 01/07/19  5:46 PM   Specimen: BLOOD LEFT HAND  Result Value Ref Range Status   Specimen Description   Final    BLOOD LEFT HAND Performed at [facility] [address], [place], [place] [address]    Special Requests   Final    BOTTLES DRAWN AEROBIC ONLY Blood Culture results may not be optimal due to Rasmussen inadequate volume of blood received in culture bottles Performed at [facility] [address], [place], [place] [address]    Culture   Final    NO GROWTH 2 DAYS Performed at [facility] [address], [place],  [address]    Report Status PENDING  Incomplete  Culture, blood (routine x 2)     Status: None (Preliminary result)   Collection Time: 01/07/19  5:46 PM   Specimen: BLOOD  Result Value Ref Range Status   Specimen Description  Final    BLOOD LEFT ANTECUBITAL Performed at [facility] [address] [place], [place] [address]    Special Requests   Final    BOTTLES DRAWN AEROBIC ONLY Blood Culture adequate volume Performed at [facility] [address] [place], [place] [address]    Culture   Final    NO GROWTH 2 DAYS Performed at [facility] [address] [place], [place] [address]    Report Status PENDING  Incomplete         Radiology Studies: Dg Chest 2 View  Result Date: 01/08/2019 CLINICAL DATA:  Follow-up airspace disease EXAM: CHEST - 2 VIEW COMPARISON:  01/06/2019 FINDINGS: No significant change in low volume examination with small bilateral pleural effusions and associated atelectasis or consolidation, these in keeping with both prior radiographs and CT/MRI. There is no new airspace opacity. IMPRESSION: No significant change in low volume examination with small bilateral pleural effusions and associated atelectasis or consolidation, these in keeping with both prior radiographs and CT/MRI. There is no new airspace opacity. Electronically Signed   By: Kaylee Rasmussen M.D.   On: 01/08/2019 12:27   Vas [place] Lower Extremity Venous (dvt)  Result Date: 01/09/2019  Lower Venous Study Indications: Edema.  Limitations: Bruising in the lower leg and edema. Performing Technologist: Kaylee Rasmussen  Examination Guidelines: Kaylee Rasmussen complete evaluation includes B-mode imaging, spectral Doppler, color Doppler, and power Doppler as needed of all accessible portions of each vessel.  Bilateral testing is considered Rasmussen integral part of Kaylee Rasmussen complete examination. Limited examinations for reoccurring indications may be performed as noted.  +---------+---------------+---------+-----------+----------+-------------------+ RIGHT    CompressibilityPhasicitySpontaneityPropertiesSummary             +---------+---------------+---------+-----------+----------+-------------------+ CFV      Full           Yes      Yes                                      +---------+---------------+---------+-----------+----------+-------------------+ SFJ      Full                                                             +---------+---------------+---------+-----------+----------+-------------------+ FV Prox  Full           Yes      Yes                                      +---------+---------------+---------+-----------+----------+-------------------+ FV Mid   Full                                                             +---------+---------------+---------+-----------+----------+-------------------+ FV DistalFull           Yes      Yes                                      +---------+---------------+---------+-----------+----------+-------------------+  PFV      Full           Yes      Yes                                      +---------+---------------+---------+-----------+----------+-------------------+ POP      Full           Yes      Yes                                      +---------+---------------+---------+-----------+----------+-------------------+ PTV      Full                                         Difficult to image                                                        due to above                                                              mentioned                                                                 limitations         +---------+---------------+---------+-----------+----------+-------------------+ PERO      Full                                         Difficult to image                                                        due to above                                                              mentioned  limitations         +---------+---------------+---------+-----------+----------+-------------------+   +----+---------------+---------+-----------+----------+-------+ LEFTCompressibilityPhasicitySpontaneityPropertiesSummary +----+---------------+---------+-----------+----------+-------+ CFV Full           Yes      Yes                          +----+---------------+---------+-----------+----------+-------+ SFJ Full                                                 +----+---------------+---------+-----------+----------+-------+     Summary: Right: There is no evidence of deep vein thrombosis in the lower extremity. No cystic structure found in the popliteal fossa. Left: There is no evidence of Kaylee Rasmussen common femoral vein obstruction  *See table(s) above for measurements and observations. Electronically signed by Kaylee Martinez MD on 01/09/2019 at 11:50:26 AM.    Final         Scheduled Meds: . calcium carbonate  500 mg of elemental calcium Oral BID   And  . cholecalciferol  200 Units Oral BID  . escitalopram  10 mg Oral Daily  . feeding supplement (PRO-STAT SUGAR FREE 64)  30 mL Oral BID  . levothyroxine  88 mcg Oral Q0600  . potassium chloride  10 mEq Oral Daily  . sodium bicarbonate  650 mg Oral BID   Continuous Infusions: . azithromycin Stopped (01/08/19 1856)  . cefTRIAXone (ROCEPHIN)  IV 2 g (01/08/19 1646)  . lactated ringers 75 mL/hr at 01/08/19 2345  . lactated ringers 100 mL/hr at 01/09/19 1400     LOS: 2 days    Time spent: over 30 min    Fayrene Helper, MD [facility] Pager AMION  If 7PM-7AM, please contact night-coverage [email or web address] Password [facility]  01/09/2019, 2:07 PM

## 2019-01-09 NOTE — Progress Notes (Signed)
Initial Nutrition Assessment  RD working remotely.   DOCUMENTATION CODES:   (unable to assess for malnutrition at this time.)  INTERVENTION:  - continue 30 mL Prostat BID, each supplement provides 100 kcal and 15 grams of protein. - will order Boost Breeze BID, each supplement provides 250 kcal and 9 grams of protein. - will order Ensure Enlive once/day, each supplement provides 350 kcal and 20 grams of protein. - will order daily multivitamin with minerals. - continue to encourage PO intakes and advance diet as medically feasible. - will monitor for diet education needs.  - recommend vitamin B complex with vitamin C   NUTRITION DIAGNOSIS:   Inadequate oral intake related to acute illness, decreased appetite as evidenced by per patient/family report, meal completion < 50%.  GOAL:   Patient will meet greater than or equal to 90% of their needs  MONITOR:   PO intake, Supplement acceptance, Diet advancement, Labs, Weight trends, I & O's  REASON FOR ASSESSMENT:   Consult Assessment of nutrition requirement/status  ASSESSMENT:   68 y.o. female with medical history of HTN, hyperlipidemia, asthma, rheumatoid arthritis, CMV colitis, HSV esophagitis in 07/2018, h/o necrotizing glomerulonephritis/vasculitis(+ ANCA), and pancytopenia secondary to cyclophosphamide. She presented to the ED with complaints of chronic diarrhea and intermittent N/V. She noted some blood in the stool and alerted outpatient GI provider who encouraged her to present to the ED. She had an EGD/colonoscopy in 07/2018 which showed an ulcerated mass-like lesion in the ileocecal valve with satellite ulcers, no malignancy noted, did show esophagitis and gastritis. She was admitted for c/o rectal bleeding, chronic diarrhea, non-AG acidosis secondary to diarrhea, and abnormal liver function.  Patient was admitted on 7/16 and was NPO at that time. Diet advanced to Soft on 7/17 at 11:20 AM, downgraded to CLD on 7/18 at 9:20  AM. She was made NPO at midnight last not for colonoscopy this AM and then advanced to FLD at 10:15 AM today. Per RN flow sheet, she consumed 25% of dinner on 7/17 and 25% of breakfast on 7/18.   Unable to reach patient by phone at this time. Prostat was ordered BID and patient has accepted 5 of the 7 packets offered to her since diet advancement on 7/17.  Per chart review, current weight is 158 lb, weight on 5/27 was 146 lb, and weight on 03/24/18 was 164 lb. This indicates 6 lb weight loss in the past 9 months.   Per notes: - abnormal liver function with MRCP showing severe diffuse hepatic steatosis and recommendation made for MRI with and without contrast in 3-6 months - chronic diarrhea with celiac panel pending - CAP - hypotension--improved - non-AG acidosis--improved - AKI on CKD stage 3 - ongoing N/V with PRN zofran ordered - R>L lower extremity edema--no DVT - pulmonary nodule with plan for outpatient follow-up    Medications reviewed; 200 mg vitamin D3/day, 88 mcg oral synthroid/day, 2 g IV Mg sulfate x1 run 7/19, 10 mg IV reglan x2 doses 7/18, 10 mEq K-Dur/day, 650 mg sodium bicarb BID.  Labs reviewed; CBG: 88 mg/dl, BUN: 24 mg/dl, creatinine: 2.18 mg/dl, Ca: 7.3 mg/dl, Mg: 1.2 mg/dl, Alk Phos elevated, GFR: 26 ml/min. IVF; LR @ 100 ml/hr.     NUTRITION - FOCUSED PHYSICAL EXAM:  unable to complete at this time.   Diet Order:   Diet Order            Diet full liquid Room service appropriate? Yes; Fluid consistency: Thin  Diet effective now                EDUCATION NEEDS:   Not appropriate for education at this time  Skin:  Skin Assessment: Reviewed RN Assessment  Last BM:  7/19  Height:   Ht Readings from Last 1 Encounters:  01/09/19 5' 1" (1.549 m)    Weight:   Wt Readings from Last 1 Encounters:  01/09/19 71.9 kg    Ideal Body Weight:  47.7 kg  BMI:  Body mass index is 29.95 kg/m.  Estimated Nutritional Needs:   Kcal:  2000-2200  kcal  Protein:  100-115 grams  Fluid:  >/= 2.5 L/day       , MS, RD, LDN, CNSC Inpatient Clinical Dietitian Pager # [phone] After hours/weekend pager # [phone]  

## 2019-01-09 NOTE — Anesthesia Procedure Notes (Signed)
Procedure Name: MAC Date/Time: 01/09/2019 8:02 AM Performed by: Lissa Morales, CRNA Pre-anesthesia Checklist: Patient identified, Emergency Drugs available, Suction available, Patient being monitored and Timeout performed Patient Re-evaluated:Patient Re-evaluated prior to induction Oxygen Delivery Method: Simple face mask Placement Confirmation: positive ETCO2

## 2019-01-09 NOTE — Progress Notes (Signed)
Correction in charting for the venous duplex completed 01/08/2019. Charting was reported as a left lower extremity which should had been a right as ordered. Dr Florene Glen has been notified of the progress note correction. [facility], [place] 01/09/2019, 7:44 AM

## 2019-01-09 NOTE — Plan of Care (Signed)

## 2019-01-09 NOTE — Transfer of Care (Signed)
Immediate Anesthesia Transfer of Care Note  Patient: Kaylee Rasmussen  Procedure(s) Performed: COLONOSCOPY WITH PROPOFOL (N/A ) BIOPSY  Patient Location: PACU  Anesthesia Type:MAC  Level of Consciousness: awake, alert , oriented and patient cooperative  Airway & Oxygen Therapy: Patient Spontanous Breathing and Patient connected to face mask oxygen  Post-op Assessment: Report given to RN, Post -op Vital signs reviewed and stable and Patient moving all extremities X 4  Post vital signs: stable  Last Vitals:  Vitals Value Taken Time  BP    Temp    Pulse    Resp    SpO2      Last Pain:  Vitals:   01/09/19 0731  TempSrc: Oral  PainSc: 0-No pain      Patients Stated Pain Goal: 3 (74/25/95 [id number])  Complications: No apparent anesthesia complications

## 2019-01-09 NOTE — Consult Note (Signed)
Pt OTF, will come back

## 2019-01-09 NOTE — Anesthesia Postprocedure Evaluation (Signed)
Anesthesia Post Note  Patient: Kaylee Rasmussen  Procedure(s) Performed: COLONOSCOPY WITH PROPOFOL (N/A ) BIOPSY     Patient location during evaluation: PACU Anesthesia Type: MAC Level of consciousness: awake and alert Pain management: pain level controlled Vital Signs Assessment: post-procedure vital signs reviewed and stable Respiratory status: spontaneous breathing Cardiovascular status: stable Anesthetic complications: no    Last Vitals:  Vitals:   01/09/19 0901 01/09/19 0915  BP: (!) 108/49 (!) 104/52  Pulse: 74 77  Resp: (!) 25   Temp:  36.5 C  SpO2: 92% 100%    Last Pain:  Vitals:   01/09/19 1014  TempSrc:   PainSc: [place]

## 2019-01-09 NOTE — Progress Notes (Addendum)
    I called husband and updated him re: colonoscopy results.  Conversations with patient and husband tell me she had to stop Tx CMV early due to low counts.  I am requesting a dc summary from [facility].  Note also that PCP  Nicoletta Dress, MD  Has all records per husband so we can ask his office also - tomorrow.  Peripheral IV access is shot - d/w Dr. Marcelline Deist and will order midline vs PICC  Gatha Mayer, MD, [facility] 01/09/2019 9:01 AM Pager [phone]

## 2019-01-09 NOTE — Op Note (Signed)
[facility] Patient Name: Kaylee Rasmussen Procedure Date: 01/09/2019 MRN: [id number] Attending MD: Gatha Mayer , MD Date of Birth: Jun 02, 1951 CSN: [id number] Age: 68 Admit Type: Inpatient Procedure:                Colonoscopy Indications:              Chronic diarrhea, Rectal bleeding, hx CMV ?                            recurrent Providers:                Gatha Mayer, MD, Glori Bickers, RN, Laverda Sorenson, Technician, Enrigue Catena, CRNA Referring MD:              Medicines:                Propofol per Anesthesia, Monitored Anesthesia Care Complications:            No immediate complications. Estimated Blood Loss:     Estimated blood loss was minimal. Estimated blood                            loss was minimal. Procedure:                Pre-Anesthesia Assessment:                           - Prior to the procedure, a History and Physical                            was performed, and patient medications and                            allergies were reviewed. The patient's tolerance of                            previous anesthesia was also reviewed. The risks                            and benefits of the procedure and the sedation                            options and risks were discussed with the patient.                            All questions were answered, and informed consent                            was obtained. Prior Anticoagulants: The patient has                            taken no previous anticoagulant or antiplatelet  agents. ASA Grade Assessment: III - A patient with                            severe systemic disease. After reviewing the risks                            and benefits, the patient was deemed in                            satisfactory condition to undergo the procedure.                           After obtaining informed consent, the colonoscope                            was passed  under direct vision. Throughout the                            procedure, the patient's blood pressure, pulse, and                            oxygen saturations were monitored continuously. The                            CF-HQ190L ([id number] Olympus colonoscope was                            introduced through the anus and advanced to the the                            terminal ileum, with identification of the                            appendiceal orifice and IC valve. The colonoscopy                            was performed without difficulty. The patient                            tolerated the procedure well. The quality of the                            bowel preparation was excellent. The bowel                            preparation used was Miralax via split dose                            instruction. The terminal ileum, ileocecal valve,                            appendiceal orifice, and rectum were photographed. Scope In: 8:18:20 AM Scope Out: 8:30:32 AM Scope Withdrawal Time: 0 hours 9 minutes 4 seconds  Total Procedure  Duration: 0 hours 12 minutes 12 seconds  Findings:      Hemorrhoids were found on perianal exam.      Skin tags were found on perianal exam.      The digital rectal exam was normal.      Localized inflammation characterized by altered vascularity, congestion       (edema) and erythema was found at the ileocecal valve. Biopsies were       taken with a cold forceps for histology. Verification of patient       identification for the specimen was done. Estimated blood loss was       minimal.      The terminal ileum appeared normal.      A tattoo was seen in the ascending colon, in the proximal ascending       colon and in the cecum.      Multiple diverticula were found in the left colon.      External and internal hemorrhoids were found.      The exam was otherwise without abnormality on direct and retroflexion       views.      Biopsies for histology were  taken with a cold forceps from the right       colon and left colon for evaluation of microscopic colitis. Impression:               - Hemorrhoids found on perianal exam.                           - Perianal skin tags found on perianal exam.                           - Localized inflammation was found at the ileocecal                            valve. Biopsied.                           - The examined portion of the ileum was normal.                           - A tattoo was seen in the ascending colon, in the                            proximal ascending colon and in the cecum.                           - Diverticulosis in the left colon.                           - External and internal hemorrhoids.                           - The examination was otherwise normal on direct                            and retroflexion views.                           -  Biopsies were taken with a cold forceps from the                            right colon and left colon for evaluation of                            microscopic colitis. Moderate Sedation:      Not Applicable - Patient had care per Anesthesia. Recommendation:           - Return patient to hospital ward for ongoing care.                           - THIS COULD BE SCARRING VS PERSISTENT/RECURRENT CMV                           AWAIT BXS, CMV PCR                           HYPOALBUMINEMIA CAN CONTRIBUTE TO/CAUSE DIARRHEA                           HEMORRHOIDS ARE SOURCE OF BLEEDING I SUSPECT                           FULL LIQUIDS AND ADVANCE AS TOLERATED                           WE WILL SEE AGAIN TOMORROW Procedure Code(s):        --- Professional ---                           [phone], Colonoscopy, flexible; with biopsy, single                            or multiple Diagnosis Code(s):        --- Professional ---                           K52.9, Noninfective gastroenteritis and colitis,                            unspecified                            K64.8, Other hemorrhoids                           K64.4, Residual hemorrhoidal skin tags                           K62.5, Hemorrhage of anus and rectum                           K57.30, Diverticulosis of large intestine without                            perforation or abscess without bleeding  CPT copyright 2019 American [employer] Association. All rights reserved. The codes documented in this report are preliminary and upon coder review may  be revised to meet current compliance requirements. Gatha Mayer, MD 01/09/2019 8:48:24 AM This report has been signed electronically. Number of Addenda: 0

## 2019-01-10 ENCOUNTER — Encounter (HOSPITAL_COMMUNITY): Payer: Self-pay | Admitting: Internal Medicine

## 2019-01-10 ENCOUNTER — Other Ambulatory Visit: Payer: Self-pay

## 2019-01-10 DIAGNOSIS — A0839 Other viral enteritis: Secondary | ICD-10-CM

## 2019-01-10 DIAGNOSIS — E43 Unspecified severe protein-calorie malnutrition: Secondary | ICD-10-CM

## 2019-01-10 DIAGNOSIS — R945 Abnormal results of liver function studies: Secondary | ICD-10-CM

## 2019-01-10 DIAGNOSIS — R197 Diarrhea, unspecified: Secondary | ICD-10-CM

## 2019-01-10 DIAGNOSIS — B259 Cytomegaloviral disease, unspecified: Secondary | ICD-10-CM

## 2019-01-10 LAB — CBC
HCT: 28.3 % — ABNORMAL LOW (ref 36.0–46.0)
Hemoglobin: 9.1 g/dL — ABNORMAL LOW (ref 12.0–15.0)
MCH: 31.2 pg (ref 26.0–34.0)
MCHC: 32.2 g/dL (ref 30.0–36.0)
MCV: 96.9 fL (ref 80.0–100.0)
Platelets: 184 10*3/uL (ref 150–400)
RBC: 2.92 MIL/uL — ABNORMAL LOW (ref 3.87–5.11)
RDW: 18.2 % — ABNORMAL HIGH (ref 11.5–15.5)
WBC: 11.2 10*3/uL — ABNORMAL HIGH (ref 4.0–10.5)
nRBC: 0 % (ref 0.0–0.2)

## 2019-01-10 LAB — COMPREHENSIVE METABOLIC PANEL
ALT: 13 U/L (ref 0–44)
AST: 53 U/L — ABNORMAL HIGH (ref 15–41)
Albumin: 1.5 g/dL — ABNORMAL LOW (ref 3.5–5.0)
Alkaline Phosphatase: 175 U/L — ABNORMAL HIGH (ref 38–126)
Anion gap: 13 (ref 5–15)
BUN: 25 mg/dL — ABNORMAL HIGH (ref 8–23)
CO2: 20 mmol/L — ABNORMAL LOW (ref 22–32)
Calcium: 7.3 mg/dL — ABNORMAL LOW (ref 8.9–10.3)
Chloride: 108 mmol/L (ref 98–111)
Creatinine, Ser: 1.99 mg/dL — ABNORMAL HIGH (ref 0.44–1.00)
GFR calc Af Amer: 30 mL/min — ABNORMAL LOW (ref >60)
GFR calc non Af Amer: 26 mL/min — ABNORMAL LOW (ref >60)
Glucose, Bld: 67 mg/dL — ABNORMAL LOW (ref 70–99)
Potassium: 3.6 mmol/L (ref 3.5–5.1)
Sodium: 141 mmol/L (ref 135–145)
Total Bilirubin: 1.5 mg/dL — ABNORMAL HIGH (ref 0.3–1.2)
Total Protein: 4.3 g/dL — ABNORMAL LOW (ref 6.5–8.1)

## 2019-01-10 LAB — MAGNESIUM: Magnesium: 1.7 mg/dL (ref 1.7–2.4)

## 2019-01-10 LAB — FOLATE RBC
Folate, Hemolysate: 255 ng/mL
Folate, RBC: 861 ng/mL (ref >498)
Hematocrit: 29.6 % — ABNORMAL LOW (ref 34.0–46.6)

## 2019-01-10 LAB — GLIADIN ANTIBODIES, SERUM
Antigliadin Abs, IgA: 2 units (ref 0–19)
Gliadin IgG: 0 units (ref 0–19)

## 2019-01-10 LAB — MITOCHONDRIAL ANTIBODIES: Mitochondrial M2 Ab, IgG: <20 Units (ref 0.0–20.0)

## 2019-01-10 LAB — ANTI-SMOOTH MUSCLE ANTIBODY, IGG: F-Actin IgG: 6 Units (ref 0–19)

## 2019-01-10 MED ORDER — LOPERAMIDE HCL 2 MG PO CAPS
2.0000 mg | ORAL_CAPSULE | Freq: Every day | ORAL | Status: DC
  Administered 2019-01-10 – 2019-01-14 (×5): 2 mg via ORAL
  Filled 2019-01-10 (×5): qty 1

## 2019-01-10 MED ORDER — LACTATED RINGERS IV SOLN
INTRAVENOUS | Status: DC
  Administered 2019-01-10 – 2019-01-11 (×2): via INTRAVENOUS

## 2019-01-10 MED ORDER — CHOLESTYRAMINE LIGHT 4 G PO PACK
4.0000 g | PACK | Freq: Two times a day (BID) | ORAL | Status: DC
  Administered 2019-01-10: 4 g via ORAL
  Filled 2019-01-10 (×2): qty 1

## 2019-01-10 NOTE — Care Management Important Message (Signed)
Important Message  Patient Details IM Letter given to Rhea Pink SW to present to the Patient Name: Kaylee Rasmussen MRN: [id number] Date of Birth: 07-08-1950   Medicare Important Message Given:  Yes     Kerin Salen 01/10/2019, 11:00 AM

## 2019-01-10 NOTE — Progress Notes (Addendum)
PROGRESS NOTE    Kaylee Rasmussen  ZLD:[id number] DOB: [birth year] DOA: 01/06/2019 PCP: Nicoletta Dress, MD   Brief Narrative:  Kaylee Rasmussen  is a 68 y.o. 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 Bx-CMV colitis. No malignancy. Records scanned in "MEDIA TAB".EGD showed LA grade D reflux esophagitis, gastritis. -Colonoscopy 08/12/2017-small colonic polyp, moderate colonic diverticulosis  Reviewed CT scan Abd/ pelvis  IMPRESSION: 1. Abnormal appearance of the liver with heterogeneous hepatic steatosis and heterogeneous liver enhancement. Questionable fine liver surface irregularity. Cannot exclude hepatic cirrhosis. No discrete liver masses. Recommend correlation with liver enzymes. 2. Third-spacing of fluid with moderate ascites, small dependent bilateral pleural effusions, right greater than left, and mild anasarca. 3. No evidence of bowel obstruction or acute bowel inflammation. Mild colonic diverticulosis, without convincing findings of acute diverticulitis. 4. Patchy bandlike consolidation in the left lung base, cannot exclude pneumonia or aspiration. 5. Left lung base 4 mm solid pulmonary nodule. No follow-up needed if patient is low-risk. Non-contrast chest CT can be considered in 12 months if patient is high-risk.    In ED,  T 98.5  P 67 R 16  Bp 80/58  Pox 97% on RA Wt 63.5 kg  Current BP 121/58  Na  138, K 4.9,  Bun 21, Creatinine 1.66 Hco3 16, AG 15 Alb 2.2 Ast 96, Alt 20, Alk phos 258, T. Bili 2.6  Wbc 10.9, hgb 10.5, Plt 225 Type and screen done  FOBT negative  covid -19 negative  Pt will be admitted for c/o rectal bleeding , and chronic diarrhea, and non-AG acidosis secondary to diarrhea, and abnormal liver function.    Assessment & Plan:   Principal Problem:   ARF (acute renal failure) (HCC) Active Problems:   Rheumatoid arthritis involving multiple sites with positive rheumatoid factor (HCC)   History of hypertension   History of hypothyroidism   Abnormal liver function   Diarrhea   Pulmonary nodule   Metabolic acidosis   Severe protein-calorie malnutrition (HCC)   Rectal bleeding  Blood in stool GI consult, appreciate recommendations Colonoscopy with localized inflammation at ileocecal valve, external and internal hemorrhoids - see report - bx taken (results pending) ? Recurrent/persistent CMV  Follow CMV dna by pcr (pending)  Abnormal liver function  Check GGT (elevated) Check ferritin (elevated), iron (wnl), tibc (not calc), ceruloplasmin (wnl), alpha 1 antitripsin (wnl), ana (negative), antismooth muscle ab (pending), check anti mitochondrial antibody (pending) Check MRCP -> with severe diffuse hepatic steatosis (can't exclude cirrhosis) - solitary subcentimeter R liver lesion, likely benign cyst -> recommend f/u MRI with and without IV contrast in 3-6 months  Chronic diarrhea Check celiac panel (gliadin antibodies - wnl, TTG - normal, and reticulin antibody pending) Cholestyramine prn Appreciate GI recs  Community Acquired Pneumonia  Fever:  Follow blood cx NGTD x 3 CXR with patchy airspace dz in L lung base (early pneumonia vs sequelae of aspiration) Repeat 2 view CXR with small bilateral effusions and associated atelectasis vs consolidation Follow speech recs Ceftriaxone/azithro x 5 days  Hypotension: improved, continue  holding BP meds.    Right Greater than Left Effusions  Moderate Volume Ascites:  Continue to monitor for now Consider paracentesis prior to d/c  Non- AG acidosis Improved, d/c IV bicarb Pt on PO bicarb  AKI on CKD III: creatinine worsened in setting of soft BP's on 7/18.  Peaked at 2.18  Continue IVF.  Improving now.  baseline creatinine appears to be ~1.5, continue to monitor Follow urinalysis (pending collection) No hydro on imaging from earlier in hospitalization  N/v Zofran 94m iv q6h prn   Hypertension Hold antihypertensives below with hypotension this AM Cont Amlodipine 2.558mpo qday Cont Toprol XL 10084mo qday  Hypothyroidism Cont Levothyroxine 88 micrograms po qday  Anxiety Cont Lexapro 83m72m qday  Anemia Check cbc in am  RA Off Humira, follows with Deveshwar Voltaren gel prn   Pulmonary Nodule: outpatient follow up  Right greater than Left LEE:  RLE without DVT.  Hypoglycemia: mild on CMP, continue to monitor.  Seems asx.  Follow POCT BG and tomorrow labs.  DVT prophylaxis: SCD Code Status: full  Family Communication: none at bedside Disposition Plan: pending   Consultants:   GI  Procedures:  Colonoscopy Impression - Hemorrhoids found on perianal exam. - Perianal skin tags found on perianal exam. - Localized inflammation was found at the ileocecal valve. Biopsied. - The examined portion of the ileum was normal. - A tattoo was seen in the ascending colon, in the proximal ascending colon and in the cecum. - Diverticulosis in the left colon. - External and internal hemorrhoids. - The examination was otherwise normal on direct and retroflexion views. - Biopsies were taken with a cold forceps from the right colon and left colon for evaluation of microscopic colitis. Recommendation - Return patient to hospital ward for ongoing care. - THIS COULD BE SCARRING VS PERSISTENT/RECURRENT CMV AWAIT BXS, CMV PCR HYPOALBUMINEMIA CAN CONTRIBUTE TO/CAUSE DIARRHEA  HEMORRHOIDS ARE SOURCE OF BLEEDING I SUSPECT FULL LIQUIDS AND ADVANCE AS TOLERATED WE WILL SEE AGAIN TOMORROW  Antimicrobials:  Anti-infectives (From admission, onward)   Start     Dose/Rate Route Frequency Ordered Stop   01/07/19 1700  cefTRIAXone (ROCEPHIN) 2 g in sodium chloride 0.9 % 100 mL IVPB     2 g 200 mL/hr over 30 Minutes Intravenous Every 24 hours 01/07/19 1557 01/12/19 1659   01/07/19 1700  azithromycin (ZITHROMAX) 500 mg in sodium chloride 0.9 % 250 mL IVPB     500 mg 250 mL/hr over 60 Minutes Intravenous Every 24 hours 01/07/19 1557 01/12/19 1659     Subjective: Feels a little lonely Frustrated being in hospital.  Objective: Vitals:   01/09/19 2122 01/10/19 0500 01/10/19 0546 01/10/19 1330  BP: (!) 109/55  (!) 109/45 (!) 96/47  Pulse: 83  85 93  Resp: 17  18 (!) 22  Temp: 98.8 F (37.1 C)  98.4 F (36.9 C) 99.1 F (37.3 C)  TempSrc: Oral  Oral Oral  SpO2: 97%  94% 90%  Weight:  72.9 kg    Height:        Intake/Output Summary (Last 24 hours) at 01/10/2019 1803 Last data filed at 01/10/2019 1700 Gross per 24 hour  Intake 2550.18 ml  Output -  Net 2550.18 ml   Filed Weights   01/09/19 0711 01/09/19 0731 01/10/19 0500  Weight: 71.9 kg 71.9 kg 72.9 kg    Examination:  General: No acute distress. Cardiovascular: Heart sounds show a regular rate, and rhythm. Lungs: Clear to auscultation bilaterally Abdomen: Soft,  nontender, nondistended Neurological: Alert and oriented 3. Moves all extremities 4 . Cranial nerves II through XII grossly intact. Skin: Warm and dry. No rashes or lesions. Extremities: No clubbing or cyanosis. No edema.   Data Reviewed: I have personally reviewed following labs and imaging studies  CBC: Recent Labs  Lab 01/07/19 0056 01/07/19 0928 01/08/19 0630 01/09/19 0559 01/10/19 0534  WBC 10.7* 11.1* 15.3* 15.8* 11.2*  HGB 10.6* 10.2* 9.6* 9.8* 9.1*  HCT 34.5* 31.7*  29.6* 30.3* 31.0* 28.3*  MCV 99.7 96.9 99.3 96.0 96.9   PLT 223 232 194 181 349   Basic Metabolic Panel: Recent Labs  Lab 01/07/19 0056 01/07/19 0928 01/08/19 0630 01/09/19 0559 01/10/19 0534  NA 139 139 139 141 141  K 4.5 3.6 4.1 4.1 3.6  CL 108 106 106 109 108  CO2 20* 22 21* 17* 20*  GLUCOSE 66* 106* 96 64* 67*  BUN _0 24* 25*  CREATININE 1.63* 1.63* 2.04* 2.18* 1.99*  CALCIUM 7.5* 7.4* 7.1* 7.3* 7.3*  MG  --   --   --  1.2* 1.7   GFR: Estimated Creatinine Clearance: 25.4 mL/min (A) (by C-G formula based on SCr of 1.99 mg/dL (H)). Liver Function Tests: Recent Labs  Lab 01/07/19 0056 01/07/19 0928 01/08/19 0630 01/09/19 0559 01/10/19 0534  AST 80* 72* 56* 57* 53*  ALT _1 ALKPHOS 210* 198* 160* 160* 175*  BILITOT 2.3* 2.1* 2.3* 1.9* 1.5*  PROT 5.4* 4.9* 4.7* 4.3* 4.3*  ALBUMIN 2.0* 1.9* 1.6* 1.5* 1.5*   No results for input(s): LIPASE, AMYLASE in the last 168 hours. No results for input(s): AMMONIA in the last 168 hours. Coagulation Profile: No results for input(s): INR, PROTIME in the last 168 hours. Cardiac Enzymes: No results for input(s): CKTOTAL, CKMB, CKMBINDEX, TROPONINI in the last 168 hours. BNP (last 3 results) No results for input(s): PROBNP in the last 8760 hours. HbA1C: No results for input(s): HGBA1C in the last 72 hours. CBG: Recent Labs  Lab 01/07/19 1341 01/07/19 2024 01/08/19 0741 01/08/19 1627 01/09/19 0045  GLUCAP 97 117* 89 72 88   Lipid Profile: No results for input(s): CHOL, HDL, LDLCALC, TRIG, CHOLHDL, LDLDIRECT in the last 72 hours. Thyroid Function Tests: No results for input(s): TSH, T4TOTAL, FREET4, T3FREE, THYROIDAB in the last 72 hours. Anemia Panel: No results for input(s): VITAMINB12, FOLATE, FERRITIN, TIBC, IRON, RETICCTPCT in the last 72 hours. Sepsis Labs: No results for input(s): PROCALCITON, LATICACIDVEN in the last 168 hours.  Recent Results (from the past 240 hour(s))  SARS Coronavirus 2 (CEPHEID - Performed in [place] hospital lab), Hosp  Order     Status: None   Collection Time: 01/06/19  9:23 PM   Specimen: Nasopharyngeal Swab  Result Value Ref Range Status   SARS Coronavirus 2 NEGATIVE NEGATIVE Final    Comment: (NOTE) If result is NEGATIVE SARS-CoV-2 target nucleic acids are NOT DETECTED. The SARS-CoV-2 RNA is generally detectable in upper and lower  respiratory specimens during the acute phase of infection. The lowest  concentration of SARS-CoV-2 viral copies this assay can detect is 250  copies / mL. A negative result does not preclude SARS-CoV-2 infection  and should not be used as the sole basis for treatment or other  patient management decisions.  A negative result may occur with  improper specimen collection / handling, submission of specimen other  than nasopharyngeal swab, presence of viral mutation(s) within the  areas targeted by this assay, and  inadequate number of viral copies  (<250 copies / mL). A negative result must be combined with clinical  observations, patient history, and epidemiological information. If result is POSITIVE SARS-CoV-2 target nucleic acids are DETECTED. The SARS-CoV-2 RNA is generally detectable in upper and lower  respiratory specimens dur ing the acute phase of infection.  Positive  results are indicative of active infection with SARS-CoV-2.  Clinical  correlation with patient history and other diagnostic information is  necessary to determine patient infection status.  Positive results do  not rule out bacterial infection or co-infection with other viruses. If result is PRESUMPTIVE POSTIVE SARS-CoV-2 nucleic acids MAY BE PRESENT.   A presumptive positive result was obtained on the submitted specimen  and confirmed on repeat testing.  While 2019 novel coronavirus  (SARS-CoV-2) nucleic acids may be present in the submitted sample  additional confirmatory testing may be necessary for epidemiological  and / or clinical management purposes  to differentiate between  SARS-CoV-2  and other Sarbecovirus currently known to infect humans.  If clinically indicated additional testing with an alternate test  methodology [phone]) is advised. The SARS-CoV-2 RNA is generally  detectable in upper and lower respiratory sp ecimens during the acute  phase of infection. The expected result is Negative. Fact Sheet for Patients:  StrictlyIdeas.no Fact Sheet for Healthcare Providers: BankingDealers.co.za This test is not yet approved or cleared by the [place] FDA and has been authorized for detection and/or diagnosis of SARS-CoV-2 by FDA under an Emergency Use Authorization (EUA).  This EUA will remain in effect (meaning this test can be used) for the duration of the COVID-19 declaration under Section 564(b)(1) of the Act, 21 U.S.C. section 360bbb-3(b)(1), unless the authorization is terminated or revoked sooner. Performed at [facility], [place] [address], [place], [place] [address]   Culture, blood (routine x 2)     Status: None (Preliminary result)   Collection Time: 01/07/19  5:46 PM   Specimen: BLOOD LEFT HAND  Result Value Ref Range Status   Specimen Description   Final    BLOOD LEFT HAND Performed at [facility] [address] [place], [place] [address]    Special Requests   Final    BOTTLES DRAWN AEROBIC ONLY Blood Culture results may not be optimal due to an inadequate volume of blood received in culture bottles Performed at [facility] [address], [place], [place] [address]    Culture   Final    NO GROWTH 3 DAYS Performed at [facility], [place] [address] [place], [place] [address]    Report Status PENDING  Incomplete  Culture, blood (routine x 2)     Status: None (Preliminary result)   Collection Time: 01/07/19  5:46 PM   Specimen: BLOOD  Result Value Ref Range Status   Specimen Description   Final    BLOOD LEFT ANTECUBITAL Performed  at [facility] [address] [place], [place] [address]    Special Requests   Final    BOTTLES DRAWN AEROBIC ONLY Blood Culture adequate volume Performed at [facility] [address], [place], [place] [address]    Culture   Final    NO GROWTH 3 DAYS Performed at [facility], [place] [address], [place], [place] [address]    Report Status PENDING  Incomplete         Radiology Studies: No results found.      Scheduled Meds: .  calcium carbonate  500 mg of elemental calcium Oral BID   And  . cholecalciferol  200 Units Oral BID  . cholestyramine light  4 g Oral Q12H  . escitalopram  10 mg Oral Daily  . feeding supplement  1 Container Oral BID BM  . feeding supplement (ENSURE ENLIVE)  237 mL Oral Q24H  . feeding supplement (PRO-STAT SUGAR FREE 64)  30 mL Oral BID  . levothyroxine  88 mcg Oral Q0600  . loperamide  2 mg Oral Daily  . multivitamin with minerals  1 tablet Oral Daily  . potassium chloride  10 mEq Oral Daily  . sodium bicarbonate  650 mg Oral BID   Continuous Infusions: . azithromycin 500 mg (01/10/19 1708)  . cefTRIAXone (ROCEPHIN)  IV 2 g (01/10/19 1631)  . lactated ringers 75 mL/hr at 01/08/19 2345     LOS: 3 days    Time spent: over 30 min    Fayrene Helper, MD [facility] Pager AMION  If 7PM-7AM, please contact night-coverage [email or web address] Password [facility] 01/10/2019, 6:03 PM

## 2019-01-10 NOTE — Progress Notes (Addendum)
Progress Note    ASSESSMENT AND PLAN:   14. 68 yo female with crampy diarrhea with blood / weight loss. 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 Will try one 2 mg imodium daily. Apparently quite sensitive to imodium as just one daily has previously led to constipation.    2. ? Cirrhosis, by CT scan. Secondary to long term use of methotrexate vr fatty liver vs other. No hx of right sided heart failure. No evidence for acute decompensation, outpatient workup appropriate  -ceruloplasmin, A-1 antitrypsin, ANA , tTg, HBsAg, HCV ab all negative -AMA, ASMA both pending  3. RA, was on methotrexate for years, also treated last year with humira.   4. CKD 3  5. Chronic anemia, likely multifactorial. No evidence for iron deficiency.  6. CAP, on Zithromax and Rocephin   SUBJECTIVE   Tired of the diarrhea. Swollen from IVF and hard to get up / down from down to go to the bathroom.    OBJECTIVE:     Vital signs in last 24 hours: Temp:  [97.7 F (36.5 C)-98.8 F (37.1 C)] 98.4 F (36.9 C) (07/20 0546) Pulse Rate:  [74-85] 85 (07/20 0546) Resp:  [17-25] 18 (07/20 0546) BP: (104-109)/(45-55) 109/45 (07/20 0546) SpO2:  [92 %-100 %] 94 % (07/20 0546) Weight:  [72.9 kg] 72.9 kg (07/20 0500) Last BM Date: 01/09/19 General:   Alert female sitting on bedside commode  in NAD EENT:  Normal hearing, non icteric sclera, conjunctive pink.  Heart:  Regular rate and rhythm;  2+ BLE edema   Pulm: short of breath at rest. Lungs CTA bilaterally without wheezes or crackles. Abdomen:  Soft, nondistended, nontender.  Normal bowel sounds,.   Neurologic:  Alert and  oriented x4;  grossly normal neurologically. Psych:  Pleasant but emotional   Intake/Output  from previous day: 07/19 0701 - 07/20 0700 In: 2930.2 [P.O.:240; I.V.:2690.2] Out: -  Intake/Output this shift: No intake/output data recorded.  Lab Results: Recent Labs    01/08/19 0630 01/09/19 0559 01/10/19 0534  WBC 15.3* 15.8* 11.2*  HGB 9.6* 9.8* 9.1*  HCT 30.3* 31.0* 28.3*  PLT 194 181 184   BMET Recent Labs    01/08/19 0630 01/09/19 0559 01/10/19 0534  NA 139 141 141  K 4.1 4.1 3.6  CL 106 109 108  CO2 21* 17* 20*  GLUCOSE 96 64* 67*  BUN 23 24* 25*  CREATININE 2.04* 2.18* 1.99*  CALCIUM 7.1* 7.3* 7.3*   LFT Recent Labs    01/10/19 0534  PROT 4.3*  ALBUMIN 1.5*  AST 53*  ALT 13  ALKPHOS 175*  BILITOT 1.5*   PT/INR No results for input(s): LABPROT, INR in the last 72 hours. Hepatitis Panel No results for input(s): HEPBSAG, HCVAB, HEPAIGM, HEPBIGM in the last 72 hours.  Dg Chest 2 View  Result Date: 01/08/2019 CLINICAL DATA:  Follow-up airspace disease EXAM: CHEST - 2 VIEW COMPARISON:  01/06/2019 FINDINGS: No significant change in low volume examination with small bilateral pleural effusions and associated atelectasis or consolidation, these in keeping with both prior radiographs and CT/MRI. There is no new airspace opacity. IMPRESSION: No significant change in low volume examination with small bilateral pleural effusions and associated atelectasis or consolidation, these in keeping with both prior  radiographs and CT/MRI. There is no new airspace opacity. Electronically Signed   By: Eddie Candle M.D.   On: 01/08/2019 12:27   Vas [place] Lower Extremity Venous (dvt)  Result Date: 01/09/2019  Lower Venous Study Indications: Edema.  Limitations: Bruising in the lower leg and edema. Performing Technologist: Toma Copier RVS  Examination Guidelines: A complete evaluation includes B-mode imaging, spectral Doppler, color Doppler, and power Doppler as needed of all accessible portions of each vessel. Bilateral testing is considered an integral part of a complete  examination. Limited examinations for reoccurring indications may be performed as noted.  +---------+---------------+---------+-----------+----------+-------------------+ RIGHT    CompressibilityPhasicitySpontaneityPropertiesSummary             +---------+---------------+---------+-----------+----------+-------------------+ CFV      Full           Yes      Yes                                      +---------+---------------+---------+-----------+----------+-------------------+ SFJ      Full                                                             +---------+---------------+---------+-----------+----------+-------------------+ FV Prox  Full           Yes      Yes                                      +---------+---------------+---------+-----------+----------+-------------------+ FV Mid   Full                                                             +---------+---------------+---------+-----------+----------+-------------------+ FV DistalFull           Yes      Yes                                      +---------+---------------+---------+-----------+----------+-------------------+ PFV      Full           Yes      Yes                                      +---------+---------------+---------+-----------+----------+-------------------+ POP      Full           Yes      Yes                                      +---------+---------------+---------+-----------+----------+-------------------+ PTV      Full  Difficult to image                                                        due to above                                                              mentioned                                                                 limitations         +---------+---------------+---------+-----------+----------+-------------------+ PERO     Full                                         Difficult to  image                                                        due to above                                                              mentioned                                                                 limitations         +---------+---------------+---------+-----------+----------+-------------------+   +----+---------------+---------+-----------+----------+-------+ LEFTCompressibilityPhasicitySpontaneityPropertiesSummary +----+---------------+---------+-----------+----------+-------+ CFV Full           Yes      Yes                          +----+---------------+---------+-----------+----------+-------+ SFJ Full                                                 +----+---------------+---------+-----------+----------+-------+     Summary: Right: There is no evidence of deep vein thrombosis in the lower extremity. No cystic structure found in the popliteal fossa. Left: There is no evidence of a common femoral vein obstruction  *See table(s) above for measurements and observations. Electronically signed by Monica Martinez MD on 01/09/2019 at 11:50:26 AM.  Final      Principal Problem:   ARF (acute renal failure) (HCC) Active Problems:   Rheumatoid arthritis involving multiple sites with positive rheumatoid factor (HCC)   History of hypertension   History of hypothyroidism   Abnormal liver function   Diarrhea   Pulmonary nodule   Metabolic acidosis   Severe protein-calorie malnutrition (HCC)   Rectal bleeding     LOS: 3 days   Tye Savoy ,NP 01/10/2019, 8:49 AM     Attending physician's note   I have taken an interval history, reviewed the chart and examined the patient. I agree with the Advanced Practitioner's note, impression and recommendations.   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

## 2019-01-10 NOTE — Plan of Care (Signed)

## 2019-01-11 ENCOUNTER — Inpatient Hospital Stay (HOSPITAL_COMMUNITY): Payer: PPO

## 2019-01-11 DIAGNOSIS — R945 Abnormal results of liver function studies: Secondary | ICD-10-CM

## 2019-01-11 DIAGNOSIS — K7460 Unspecified cirrhosis of liver: Secondary | ICD-10-CM

## 2019-01-11 DIAGNOSIS — R7989 Other specified abnormal findings of blood chemistry: Secondary | ICD-10-CM

## 2019-01-11 DIAGNOSIS — K8689 Other specified diseases of pancreas: Secondary | ICD-10-CM

## 2019-01-11 LAB — GLUCOSE, CAPILLARY
Glucose-Capillary: 80 mg/dL (ref 70–99)
Glucose-Capillary: 83 mg/dL (ref 70–99)
Glucose-Capillary: 92 mg/dL (ref 70–99)

## 2019-01-11 LAB — COMPREHENSIVE METABOLIC PANEL
ALT: 15 U/L (ref 0–44)
AST: 57 U/L — ABNORMAL HIGH (ref 15–41)
Albumin: 1.6 g/dL — ABNORMAL LOW (ref 3.5–5.0)
Alkaline Phosphatase: 206 U/L — ABNORMAL HIGH (ref 38–126)
Anion gap: 13 (ref 5–15)
BUN: 27 mg/dL — ABNORMAL HIGH (ref 8–23)
CO2: 20 mmol/L — ABNORMAL LOW (ref 22–32)
Calcium: 7.6 mg/dL — ABNORMAL LOW (ref 8.9–10.3)
Chloride: 107 mmol/L (ref 98–111)
Creatinine, Ser: 2.06 mg/dL — ABNORMAL HIGH (ref 0.44–1.00)
GFR calc Af Amer: 28 mL/min — ABNORMAL LOW (ref >60)
GFR calc non Af Amer: 24 mL/min — ABNORMAL LOW (ref >60)
Glucose, Bld: 82 mg/dL (ref 70–99)
Potassium: 3.4 mmol/L — ABNORMAL LOW (ref 3.5–5.1)
Sodium: 140 mmol/L (ref 135–145)
Total Bilirubin: 1.7 mg/dL — ABNORMAL HIGH (ref 0.3–1.2)
Total Protein: 4.6 g/dL — ABNORMAL LOW (ref 6.5–8.1)

## 2019-01-11 LAB — CBC
HCT: 28.5 % — ABNORMAL LOW (ref 36.0–46.0)
Hemoglobin: 9.2 g/dL — ABNORMAL LOW (ref 12.0–15.0)
MCH: 30.8 pg (ref 26.0–34.0)
MCHC: 32.3 g/dL (ref 30.0–36.0)
MCV: 95.3 fL (ref 80.0–100.0)
Platelets: 151 10*3/uL (ref 150–400)
RBC: 2.99 MIL/uL — ABNORMAL LOW (ref 3.87–5.11)
RDW: 17.7 % — ABNORMAL HIGH (ref 11.5–15.5)
WBC: 9.3 10*3/uL (ref 4.0–10.5)
nRBC: 0 % (ref 0.0–0.2)

## 2019-01-11 LAB — URINALYSIS, ROUTINE W REFLEX MICROSCOPIC
Bilirubin Urine: NEGATIVE
Glucose, UA: NEGATIVE mg/dL
Hgb urine dipstick: NEGATIVE
Ketones, ur: NEGATIVE mg/dL
Leukocytes,Ua: NEGATIVE
Nitrite: NEGATIVE
Protein, ur: NEGATIVE mg/dL
Specific Gravity, Urine: 1.014 (ref 1.005–1.030)
pH: 5 (ref 5.0–8.0)

## 2019-01-11 LAB — MAGNESIUM: Magnesium: 1.6 mg/dL — ABNORMAL LOW (ref 1.7–2.4)

## 2019-01-11 MED ORDER — FUROSEMIDE 10 MG/ML IJ SOLN
40.0000 mg | Freq: Once | INTRAMUSCULAR | Status: AC
  Administered 2019-01-11: 40 mg via INTRAVENOUS
  Filled 2019-01-11: qty 4

## 2019-01-11 MED ORDER — PANCRELIPASE (LIP-PROT-AMYL) 12000-38000 UNITS PO CPEP
36000.0000 [IU] | ORAL_CAPSULE | Freq: Three times a day (TID) | ORAL | Status: DC
  Administered 2019-01-11 – 2019-01-20 (×22): 36000 [IU] via ORAL
  Filled 2019-01-11 (×25): qty 3

## 2019-01-11 MED ORDER — COLESTIPOL HCL 1 G PO TABS
1.0000 g | ORAL_TABLET | Freq: Two times a day (BID) | ORAL | Status: DC
  Administered 2019-01-11 – 2019-01-20 (×14): 1 g via ORAL
  Filled 2019-01-11 (×21): qty 1

## 2019-01-11 MED ORDER — POTASSIUM CHLORIDE CRYS ER 20 MEQ PO TBCR
40.0000 meq | EXTENDED_RELEASE_TABLET | Freq: Once | ORAL | Status: AC
  Administered 2019-01-11: 40 meq via ORAL
  Filled 2019-01-11: qty 2

## 2019-01-11 MED ORDER — ONDANSETRON HCL 4 MG/2ML IJ SOLN
4.0000 mg | Freq: Three times a day (TID) | INTRAMUSCULAR | Status: DC
  Administered 2019-01-11 – 2019-01-17 (×11): 4 mg via INTRAVENOUS
  Filled 2019-01-11 (×14): qty 2

## 2019-01-11 NOTE — Progress Notes (Addendum)
Progress Note    ASSESSMENT AND PLAN:   1. Crampy diarrhea with blood / weight loss. Localized inflammation at ICV on colonoscopy yesterday . Also, fecal elastase returned yesterday and is very low suggesting pancreatic exocrine insufficiency. We made cholestyramine scheduled dosing and added imodium yesterday. She couldn't tolerate taste of Prevalite. Not sure imodium helped, she had 6 diarrheal episodes last night.  -discontinue prevalite change to Colestid.  -Trial of pancreatic enzymes. Start with Creon 2 with each meal.  -CMV pcr pending -awaiting colon biopsies.  2. ? Cirrhosis by CT scan, ? secondary to long term use of methotrexate vr fatty liver vs other. No hx of right sided heart failure. No evidence for acute decompensation, outpatient workup appropriate. Autoimmune  / genetic markers / viral markers all negative. Ferritin ~ 500 but probably from systemic inflammation not iron overload.  -outpatient follow up appropriate. May need eventual liver biopsy  3. CAP, on Zithromycin and Rocephin  4. RA / CKD3 / chronic anemia     SUBJECTIVE    Rough night, 6 stools overnight  OBJECTIVE:     Vital signs in last 24 hours: Temp:  [98.4 F (36.9 C)-99.1 F (37.3 C)] 98.4 F (36.9 C) (07/21 0646) Pulse Rate:  [93-102] 97 (07/21 0646) Resp:  [18-22] 18 (07/21 0646) BP: (96-113)/(47-69) 108/69 (07/21 0646) SpO2:  [90 %-95 %] 94 % (07/21 0646) Weight:  [74.8 kg] 74.8 kg (07/21 0500) Last BM Date: 01/11/19 General:   Alert, female in NAD EENT:  Normal hearing, non icteric sclera, conjunctive pink.  Heart:  Regular rate and rhythm;   BLE pitting edema  Pulm: Normal respiratory effort, lungs CTA bilaterally without wheezes or crackles. Abdomen:  Soft, nondistended, nontender.  Normal bowel sounds,.   Neurologic:  Alert and  oriented x4;  grossly normal neurologically. Psych:  Pleasant, cooperative.  Normal mood and affect.   Intake/Output from previous day: 07/20  0701 - 07/21 0700 In: 2264.1 [P.O.:960; I.V.:954.1; IV Piggyback:350] Out: -  Intake/Output this shift: No intake/output data recorded.  Lab Results: Recent Labs    01/09/19 0559 01/10/19 0534 01/11/19 0513  WBC 15.8* 11.2* 9.3  HGB 9.8* 9.1* 9.2*  HCT 31.0* 28.3* 28.5*  PLT 181 184 151   BMET Recent Labs    01/09/19 0559 01/10/19 0534 01/11/19 0513  NA 141 141 140  K 4.1 3.6 3.4*  CL 109 108 107  CO2 17* 20* 20*  GLUCOSE 64* 67* 82  BUN 24* 25* 27*  CREATININE 2.18* 1.99* 2.06*  CALCIUM 7.3* 7.3* 7.6*   LFT Recent Labs    01/11/19 0513  PROT 4.6*  ALBUMIN 1.6*  AST 57*  ALT 15  ALKPHOS 206*  BILITOT 1.7*     Principal Problem:   ARF (acute renal failure) (HCC) Active Problems:   Rheumatoid arthritis involving multiple sites with positive rheumatoid factor (HCC)   History of hypertension   History of hypothyroidism   Abnormal liver function   Diarrhea   Pulmonary nodule   Metabolic acidosis   Severe protein-calorie malnutrition (HCC)   Rectal bleeding     LOS: 4 days   Kaylee Rasmussen ,NP 01/11/2019, 9:35 AM    Attending physician's note   I have taken an interval history, reviewed the chart and examined the patient. I agree with the Advanced Practitioner's note, impression and recommendations.   Low pancreatic elastase suggestive of pancreatic insufficiency  Will start Creon 72000 units with meals TID and 36000 units with snacks  prn  Colestid 1gm BID  Await biopsy results and CMV PCR  Cirrhosis/advanced liver fibrosis, no evidence of decompensation. Ascites and anasarca likely secondary to hypoalbuminemia May consider liver biopsy as outpatient (NASH vs secondary to methotrexate)  K. Denzil Magnuson , MD [phone]

## 2019-01-11 NOTE — Progress Notes (Signed)
PROGRESS NOTE    Kaylee Rasmussen  UJW:[id number] DOB: 1951/06/17 DOA: 01/06/2019 PCP: Paulina Fusi, MD   Brief Narrative:  Kaylee Rasmussen  is Kaylee Rasmussen 68 y.o. 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  She was admitted for blood in stool and chronic diarrhea and abnormal liver function.  She's now s/p colonoscopy with GI, awaiting bx results.  CMV pending.  With worsening edema 7/21, given dose of lasix x1.  Assessment & Plan:   Principal Problem:   ARF (acute renal failure) (HCC) Active Problems:   Rheumatoid arthritis involving multiple sites with positive rheumatoid factor (HCC)   History of hypertension   History of hypothyroidism   Abnormal liver function   Diarrhea   Pulmonary nodule   Metabolic acidosis   Severe protein-calorie malnutrition (HCC)   Rectal bleeding  Blood in stool GI consult, appreciate recommendations Colonoscopy with localized inflammation at ileocecal valve, external and internal hemorrhoids - see report - bx taken (results pending) ? Recurrent/persistent CMV  Follow CMV dna by pcr (pending)  Abnormal liver function  Check GGT (elevated) Check ferritin (elevated), iron (wnl), tibc (not calc), ceruloplasmin (wnl), alpha 1 antitripsin (wnl), ana (negative), antismooth muscle ab (wnl), check anti mitochondrial antibody (wnl) Check MRCP -> with severe diffuse hepatic steatosis (can't exclude cirrhosis) - solitary subcentimeter R liver lesion, likely benign cyst -> recommend f/u MRI with and without IV contrast in 3-6 months ? 2/2 methotrexate [place] vs fatty liver vs other per GI Will need further w/u outpatient   Chronic diarrhea  Check celiac panel (gliadin antibodies - wnl, TTG - normal, and reticulin antibody pending) Cholestyramine prn Trial of creon  Appreciate GI recs  Community Acquired Pneumonia  Fever:  Follow blood cx NGTD x 4 CXR with patchy airspace dz in L lung base (early pneumonia vs sequelae of aspiration) Repeat 2 view CXR with small bilateral effusions and associated atelectasis vs consolidation Follow speech recs Ceftriaxone/azithro x 5 days  AKI on CKD III: creatinine worsened in setting of soft BP's on 7/18.  Peaked at 2.18.  Recently improved.  baseline creatinine appears to be ~1.5, continue to monitor. Pt with worsening LE edema today, will give dose of lasix and follow response Follow repeat CXR Follow urinalysis (pending collection) No hydro on imaging from earlier in hospitalization  Right Greater than Left Effusions  Moderate Volume Ascites:  Continue to monitor for now Consider paracentesis prior to d/c  Hypotension: improved, continue holding BP meds.   Non- AG acidosis Improved, d/c IV bicarb Pt on PO bicarb  N/v Zofran 4mg  iv q6h prn   Hypertension Hold antihypertensives below with hypotension this AM Cont Amlodipine 2.5mg  po qday Cont Toprol XL 100mg  po qday  Hypothyroidism Cont Levothyroxine 88 micrograms po qday  Anxiety Cont Lexapro 10mg  po qday  Anemia Check cbc in am  RA Off Humira, follows with Deveshwar Voltaren gel prn   Pulmonary Nodule: outpatient follow up  Right greater than Left LEE:  RLE without DVT.  Hypoglycemia: mild on CMP, continue to monitor.  Seems asx.  Follow POCT BG and tomorrow labs.  DVT prophylaxis: SCD Code Status: full  Family Communication: none at bedside Disposition Plan: pending   Consultants:   GI  Procedures:  Colonoscopy  Impression - Hemorrhoids found on perianal exam. - Perianal skin tags found on perianal exam. - Localized inflammation was found at the ileocecal valve. Biopsied. - The examined  portion of the ileum was normal. - Felipe Paluch tattoo was seen in the ascending colon, in the proximal ascending colon and in the cecum. - Diverticulosis in the left colon. - External and internal hemorrhoids. - The examination was otherwise normal on direct and retroflexion views. - Biopsies were taken with Christop Hippert cold forceps from the right colon and left colon for evaluation of microscopic colitis. Recommendation - Return patient to hospital ward for ongoing care. - THIS COULD BE SCARRING VS PERSISTENT/RECURRENT CMV AWAIT BXS, CMV PCR HYPOALBUMINEMIA CAN CONTRIBUTE TO/CAUSE DIARRHEA HEMORRHOIDS ARE SOURCE OF BLEEDING I SUSPECT FULL LIQUIDS AND ADVANCE AS TOLERATED WE WILL SEE AGAIN TOMORROW  Antimicrobials:  Anti-infectives (From admission, onward)   Start     Dose/Rate Route Frequency Ordered Stop   01/07/19 1700  cefTRIAXone (ROCEPHIN) 2 g in sodium chloride 0.9 % 100 mL IVPB     2 g 200 mL/hr over 30 Minutes Intravenous Every 24 hours 01/07/19 1557 01/12/19 1659   01/07/19 1700  azithromycin (ZITHROMAX) 500 mg in sodium chloride 0.9 % 250 mL IVPB     500 mg 250 mL/hr over 60 Minutes Intravenous Every 24 hours 01/07/19 1557 01/12/19 1659     Subjective: Feels tired Not so great overall  Objective: Vitals:   01/10/19 1330 01/10/19 2303 01/11/19 0500 01/11/19 0646  BP: (!) 96/47 (!) 113/58  108/69  Pulse: 93 (!) 102  97  Resp: (!) 22 20  18   Temp: 99.1 F (37.3 C) 98.9 F (37.2 C)  98.4 F (36.9 C)  TempSrc: Oral Oral  Oral  SpO2: 90% 95%  94%  Weight:   74.8 kg   Height:        Intake/Output Summary (Last 24 hours) at 01/11/2019 1459 Last data filed at 01/11/2019 0600 Gross per 24 hour  Intake 1424.09 ml  Output -  Net 1424.09 ml   Filed Weights   01/09/19 0731 01/10/19 0500 01/11/19 0500  Weight: 71.9 kg 72.9 kg 74.8 kg    Examination:  General: No acute distress. Cardiovascular: Heart sounds show Brookelynn Hamor regular rate, and rhythm. Lungs: Clear to auscultation bilaterally   Abdomen: Soft, nontender, nondistended  Neurological: Alert and oriented 3. Moves all extremities 4. Cranial nerves II through XII grossly intact. Skin: Warm and dry. No rashes or lesions. Extremities: No clubbing or cyanosis. LE bilateral edematous 2+.    Data Reviewed: I have personally reviewed following labs and imaging studies  CBC: Recent Labs  Lab 01/07/19 0928 01/08/19 0630 01/09/19 0559 01/10/19 0534 01/11/19 0513  WBC 11.1* 15.3* 15.8* 11.2* 9.3  HGB 10.2* 9.6* 9.8* 9.1* 9.2*  HCT 31.7*  29.6* 30.3* 31.0* 28.3* 28.5*  MCV 96.9 99.3 96.0 96.9 95.3  PLT 232 194 181 184 151   Basic Metabolic Panel: Recent Labs  Lab 01/07/19 0928 01/08/19 0630 01/09/19 0559 01/10/19 0534 01/11/19 0513  NA 139 139 141 141 140  K 3.6 4.1 4.1 3.6 3.4*  CL 106 106 109 108 107  CO2 22 21* 17* 20* 20*  GLUCOSE 106* 96 64* 67* 82  BUN 22 23 24* 25* 27*  CREATININE 1.63* 2.04* 2.18* 1.99* 2.06*  CALCIUM 7.4* 7.1* 7.3* 7.3* 7.6*  MG  --   --  1.2* 1.7 1.6*   GFR: Estimated Creatinine Clearance: 24.9 mL/min (Grete Bosko) (by C-G formula based on SCr  of 2.06 mg/dL (H)). Liver Function Tests: Recent Labs  Lab 01/07/19 0928 01/08/19 0630 01/09/19 0559 01/10/19 0534 01/11/19 0513  AST 72* 56* 57* 53* 57*  ALT 16 13 12 13 15   ALKPHOS 198* 160* 160* 175* 206*  BILITOT 2.1* 2.3* 1.9* 1.5* 1.7*  PROT 4.9* 4.7* 4.3* 4.3* 4.6*  ALBUMIN 1.9* 1.6* 1.5* 1.5* 1.6*   No results for input(s): LIPASE, AMYLASE in the last 168 hours. No results for input(s): AMMONIA in the last 168 hours. Coagulation Profile: No results for input(s): INR, PROTIME in the last 168 hours. Cardiac Enzymes: No results for input(s): CKTOTAL, CKMB, CKMBINDEX, TROPONINI in the last 168 hours. BNP (last 3 results) No results for input(s): PROBNP in the last 8760 hours. HbA1C: No results for input(s): HGBA1C in the last 72 hours. CBG: Recent Labs  Lab 01/08/19 0741 01/08/19 1627 01/09/19 0045 01/11/19 0649  01/11/19 1205  GLUCAP 89 72 88 80 83   Lipid Profile: No results for input(s): CHOL, HDL, LDLCALC, TRIG, CHOLHDL, LDLDIRECT in the last 72 hours. Thyroid Function Tests: No results for input(s): TSH, T4TOTAL, FREET4, T3FREE, THYROIDAB in the last 72 hours. Anemia Panel: No results for input(s): VITAMINB12, FOLATE, FERRITIN, TIBC, IRON, RETICCTPCT in the last 72 hours. Sepsis Labs: No results for input(s): PROCALCITON, LATICACIDVEN in the last 168 hours.  Recent Results (from the past 240 hour(s))  SARS Coronavirus 2 (CEPHEID - Performed in [facility] hospital lab), Hosp Order     Status: None   Collection Time: 01/06/19  9:23 PM   Specimen: Nasopharyngeal Swab  Result Value Ref Range Status   SARS Coronavirus 2 NEGATIVE NEGATIVE Final    Comment: (NOTE) If result is NEGATIVE SARS-CoV-2 target nucleic acids are NOT DETECTED. The SARS-CoV-2 RNA is generally detectable in upper and lower  respiratory specimens during the acute phase of infection. The lowest  concentration of SARS-CoV-2 viral copies this assay can detect is 250  copies / mL. Noam Franzen negative result does not preclude SARS-CoV-2 infection  and should not be used as the sole basis for treatment or other  patient management decisions.  Torrian Canion negative result may occur with  improper specimen collection / handling, submission of specimen other  than nasopharyngeal swab, presence of viral mutation(s) within the  areas targeted by this assay, and inadequate number of viral copies  (<250 copies / mL). Nandan Willems negative result must be combined with clinical  observations, patient history, and epidemiological information. If result is POSITIVE SARS-CoV-2 target nucleic acids are DETECTED. The SARS-CoV-2 RNA is generally detectable in upper and lower  respiratory specimens dur ing the acute phase of infection.  Positive  results are indicative of active infection with SARS-CoV-2.  Clinical  correlation with patient history and other  diagnostic information is  necessary to determine patient infection status.  Positive results do  not rule out bacterial infection or co-infection with other viruses. If result is PRESUMPTIVE POSTIVE SARS-CoV-2 nucleic acids MAY BE PRESENT.   Krue Peterka presumptive positive result was obtained on the submitted specimen  and confirmed on repeat testing.  While 2019 novel coronavirus  (SARS-CoV-2) nucleic acids may be present in the submitted sample  additional confirmatory testing may be necessary for epidemiological  and / or clinical management purposes  to differentiate between  SARS-CoV-2 and other Sarbecovirus currently known to infect humans.  If clinically indicated additional testing with an alternate test  methodology [phone]) is advised. The SARS-CoV-2 RNA is generally  detectable in upper and lower respiratory sp  ecimens during the acute  phase of infection. The expected result is Negative. Fact Sheet for Patients:  BoilerBrush.com.cy Fact Sheet for Healthcare Providers: https://pope.com/ This test is not yet approved or cleared by the [place] FDA and has been authorized for detection and/or diagnosis of SARS-CoV-2 by FDA under an Emergency Use Authorization (EUA).  This EUA will remain in effect (meaning this test can be used) for the duration of the COVID-19 declaration under Section 564(b)(1) of the Act, 21 U.S.C. section 360bbb-3(b)(1), unless the authorization is terminated or revoked sooner. Performed at [facility], [address], [place], [place] [address]   Culture, blood (routine x 2)     Status: None (Preliminary result)   Collection Time: 01/07/19  5:46 PM   Specimen: BLOOD LEFT HAND  Result Value Ref Range Status   Specimen Description   Final    BLOOD LEFT HAND Performed at [facility], [address], [place], [place] [address]    Special Requests   Final    BOTTLES  DRAWN AEROBIC ONLY Blood Culture results may not be optimal due to an inadequate volume of blood received in culture bottles Performed at [facility], [address], [place], [place] [address]    Culture   Final    NO GROWTH 4 DAYS Performed at [facility], [address] [place], [place] [address]    Report Status PENDING  Incomplete  Culture, blood (routine x 2)     Status: None (Preliminary result)   Collection Time: 01/07/19  5:46 PM   Specimen: BLOOD  Result Value Ref Range Status   Specimen Description   Final    BLOOD LEFT ANTECUBITAL Performed at [facility], [address], [place], [place] [address]    Special Requests   Final    BOTTLES DRAWN AEROBIC ONLY Blood Culture adequate volume Performed at [facility], [address] [place], [place] [address]    Culture   Final    NO GROWTH 4 DAYS Performed at [facility], [address] [place], [place] [address]    Report Status PENDING  Incomplete         Radiology Studies: No results found.      Scheduled Meds: . calcium carbonate  500 mg of elemental calcium Oral BID   And  . cholecalciferol  200 Units Oral BID  . colestipol  1 g Oral BID  . escitalopram  10 mg Oral Daily  . feeding supplement  1 Container Oral BID BM  . feeding supplement (ENSURE ENLIVE)  237 mL Oral Q24H  . feeding supplement (PRO-STAT SUGAR FREE 64)  30 mL Oral BID  . levothyroxine  88 mcg Oral Q0600  . lipase/protease/amylase  36,000 Units Oral TID AC  . loperamide  2 mg Oral Daily  . multivitamin with minerals  1 tablet Oral Daily  . potassium chloride  10 mEq Oral Daily  . sodium bicarbonate  650 mg Oral BID   Continuous Infusions: . azithromycin Stopped (01/10/19 1810)  . cefTRIAXone (ROCEPHIN)  IV Stopped (01/10/19 1701)  . lactated ringers 100 mL/hr at 01/11/19 0600     LOS: 4 days    Time spent: over 30 min    Lacretia Nicks, MD [facility] Pager AMION  If 7PM-7AM, please contact night-coverage [email or web address] Password [facility] 01/11/2019, 2:59 PM

## 2019-01-11 NOTE — Progress Notes (Signed)
Spoke with RN and made her aware that the patient has a midline and she could treat the midline like a PIV

## 2019-01-12 ENCOUNTER — Inpatient Hospital Stay (HOSPITAL_COMMUNITY): Payer: PPO

## 2019-01-12 DIAGNOSIS — K909 Intestinal malabsorption, unspecified: Secondary | ICD-10-CM

## 2019-01-12 DIAGNOSIS — M0579 Rheumatoid arthritis with rheumatoid factor of multiple sites without organ or systems involvement: Secondary | ICD-10-CM

## 2019-01-12 DIAGNOSIS — Z8679 Personal history of other diseases of the circulatory system: Secondary | ICD-10-CM

## 2019-01-12 DIAGNOSIS — R911 Solitary pulmonary nodule: Secondary | ICD-10-CM

## 2019-01-12 LAB — COMPREHENSIVE METABOLIC PANEL
ALT: 16 U/L (ref 0–44)
AST: 55 U/L — ABNORMAL HIGH (ref 15–41)
Albumin: 1.6 g/dL — ABNORMAL LOW (ref 3.5–5.0)
Alkaline Phosphatase: 191 U/L — ABNORMAL HIGH (ref 38–126)
Anion gap: 10 (ref 5–15)
BUN: 30 mg/dL — ABNORMAL HIGH (ref 8–23)
CO2: 21 mmol/L — ABNORMAL LOW (ref 22–32)
Calcium: 7.7 mg/dL — ABNORMAL LOW (ref 8.9–10.3)
Chloride: 108 mmol/L (ref 98–111)
Creatinine, Ser: 2.14 mg/dL — ABNORMAL HIGH (ref 0.44–1.00)
GFR calc Af Amer: 27 mL/min — ABNORMAL LOW (ref >60)
GFR calc non Af Amer: 23 mL/min — ABNORMAL LOW (ref >60)
Glucose, Bld: 68 mg/dL — ABNORMAL LOW (ref 70–99)
Potassium: 4 mmol/L (ref 3.5–5.1)
Sodium: 139 mmol/L (ref 135–145)
Total Bilirubin: 2.1 mg/dL — ABNORMAL HIGH (ref 0.3–1.2)
Total Protein: 4.6 g/dL — ABNORMAL LOW (ref 6.5–8.1)

## 2019-01-12 LAB — BODY FLUID CELL COUNT WITH DIFFERENTIAL
Eos, Fluid: 0 %
Lymphs, Fluid: 56 %
Monocyte-Macrophage-Serous Fluid: 37 % — ABNORMAL LOW (ref 50–90)
Neutrophil Count, Fluid: 7 % (ref 0–25)
Total Nucleated Cell Count, Fluid: 95 cu mm (ref 0–1000)

## 2019-01-12 LAB — LACTATE DEHYDROGENASE, PLEURAL OR PERITONEAL FLUID: LD, Fluid: 23 U/L (ref 3–23)

## 2019-01-12 LAB — MAGNESIUM: Magnesium: 1.6 mg/dL — ABNORMAL LOW (ref 1.7–2.4)

## 2019-01-12 LAB — CULTURE, BLOOD (ROUTINE X 2)
Culture: NO GROWTH
Culture: NO GROWTH
Special Requests: ADEQUATE

## 2019-01-12 LAB — CBC
HCT: 29.7 % — ABNORMAL LOW (ref 36.0–46.0)
Hemoglobin: 9.5 g/dL — ABNORMAL LOW (ref 12.0–15.0)
MCH: 30.3 pg (ref 26.0–34.0)
MCHC: 32 g/dL (ref 30.0–36.0)
MCV: 94.6 fL (ref 80.0–100.0)
Platelets: 155 10*3/uL (ref 150–400)
RBC: 3.14 MIL/uL — ABNORMAL LOW (ref 3.87–5.11)
RDW: 17.5 % — ABNORMAL HIGH (ref 11.5–15.5)
WBC: 10.2 10*3/uL (ref 4.0–10.5)
nRBC: 0 % (ref 0.0–0.2)

## 2019-01-12 LAB — GLUCOSE, CAPILLARY
Glucose-Capillary: 68 mg/dL — ABNORMAL LOW (ref 70–99)
Glucose-Capillary: 87 mg/dL (ref 70–99)

## 2019-01-12 LAB — GRAM STAIN

## 2019-01-12 LAB — AMMONIA: Ammonia: 68 umol/L — ABNORMAL HIGH (ref 9–35)

## 2019-01-12 LAB — ALBUMIN: Albumin: 1.8 g/dL — ABNORMAL LOW (ref 3.5–5.0)

## 2019-01-12 LAB — RETICULIN ANTIBODIES, IGA W TITER: Reticulin Ab, IgA: NEGATIVE titer (ref <2.5)

## 2019-01-12 MED ORDER — ONDANSETRON HCL 4 MG/2ML IJ SOLN
4.0000 mg | Freq: Four times a day (QID) | INTRAMUSCULAR | Status: DC | PRN
  Administered 2019-01-20: 4 mg via INTRAVENOUS
  Filled 2019-01-12 (×2): qty 2

## 2019-01-12 MED ORDER — LIDOCAINE HCL 1 % IJ SOLN
INTRAMUSCULAR | Status: AC
  Filled 2019-01-12: qty 10

## 2019-01-12 MED ORDER — ALBUMIN HUMAN 25 % IV SOLN
12.5000 g | INTRAVENOUS | Status: AC
  Administered 2019-01-12: 12.5 g via INTRAVENOUS
  Filled 2019-01-12: qty 50

## 2019-01-12 MED ORDER — MAGNESIUM OXIDE 400 (241.3 MG) MG PO TABS
400.0000 mg | ORAL_TABLET | Freq: Two times a day (BID) | ORAL | Status: DC
  Administered 2019-01-12 – 2019-02-01 (×29): 400 mg via ORAL
  Filled 2019-01-12 (×38): qty 1

## 2019-01-12 NOTE — Progress Notes (Signed)
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  She was admitted for blood in stool and chronic diarrhea and abnormal liver function.  She's now s/p colonoscopy with GI, awaiting bx results.  CMV pending.  With worsening edema 7/21, given dose of lasix x1.   Assessment & Plan:   Principal Problem:   ARF (acute renal failure) (HCC) Active Problems:   Rheumatoid arthritis involving multiple sites with positive rheumatoid factor (HCC)   History of hypertension   History of hypothyroidism   Abnormal liver function   Diarrhea   Pulmonary nodule   Metabolic acidosis   Severe protein-calorie malnutrition (HCC)   Rectal bleeding   LFTs abnormal   Hepatic cirrhosis (HCC)   Pancreatic insufficiency   Blood in stool     - GI consult, appreciate recommendations     - Colonoscopy with localized inflammation at ileocecal valve, external and internal hemorrhoids - see report - bx taken (results pending)     - ? Recurrent/persistent CMV      - Follow CMV dna by pcr (pending)  Abnormal liver function      - Check GGT (elevated)     - Check ferritin (elevated), iron (wnl), tibc (not calc), ceruloplasmin (wnl), alpha 1 antitripsin (wnl), ana (negative), antismooth muscle ab (wnl), check anti mitochondrial antibody (wnl)     - Check MRCP -> with severe diffuse hepatic steatosis (can't exclude cirrhosis) - solitary subcentimeter R liver lesion, likely benign cyst -> recommend f/u MRI with and without IV contrast in  3-6 months     - ? 2/2 methotrexate [place] vs fatty liver vs other per GI     - Will need further w/u outpatient   Chronic diarrhea     - Check celiac panel (gliadin antibodies - wnl, TTG - normal, and reticulin antibody pending)     - Cholestyramine prn     - Trial of creon      - Appreciate GI recs  Community Acquired Pneumonia  Fever:      - Bld Cx Neg     - CXR with patchy airspace dz in L lung base (early pneumonia vs sequelae of aspiration)     - Repeat 2 view CXR with small bilateral effusions and associated atelectasis vs consolidation     - Ceftriaxone/azithro x 5 days  AKI on CKD III     - creatinine worsened in setting of soft BP's on 7/18.       - Peaked at 2.18; baseline creatinine appears to be ~1.5, continue to monitor.     - Pt with worsening LE edema today, will give dose of lasix and follow response     - No hydro on imaging from earlier in hospitalization  Right Greater than Left Effusions  Moderate Volume Ascites:      - consult IR for paracentesis  Hypotension/Hx of Hypertension     - BP low normal, continue holding antihypertensives for now  Non- AG acidosis     - Improved, d/c IV bicarb     - Pt on PO bicarb  N/V     -  Zofran 4mg  iv q6h prn   Hypothyroidism     - Cont Levothyroxine 88 micrograms po qday  Anxiety     - Cont Lexapro 10mg  po qday  Normocytic Anemia     - stable, Hgb 9.5, no frank bleed noted  RA     - Off Humira, follows with Kaylee Rasmussen     - Voltaren gel prn  Pulmonary Nodule     - outpatient follow up  Right greater than Left LEE     - RLE without DVT.  Hypoglycemia     - poor PO intake; encourage PO intake     - says she feels bloated and stomach is tight; will ask IR to eval for paracentesis  Hypomagnesemia     - replete, monitor  Debility     - will ask PT to eval  DVT prophylaxis: SCDs Code Status: FULL Family Communication: Spoke with husband by phone   Disposition Plan: TBD   Consultants:    GI  IR    Subjective: "I feel bloated. I don't have an appetite. My stomach is big."  Objective: Vitals:   01/11/19 2126 01/11/19 2127 01/12/19 0554 01/12/19 0625  BP: (!) 105/52   106/63  Pulse: (!) 101 100  89  Resp: (!) 21   (!) 21  Temp: 98.5 F (36.9 C)   98 F (36.7 C)  TempSrc: Oral   Oral  SpO2: 94% 95%  100%  Weight:   76.1 kg   Height:        Intake/Output Summary (Last 24 hours) at 01/12/2019 1439 Last data filed at 01/12/2019 0930 Gross per 24 hour  Intake 600 ml  Output 100 ml  Net 500 ml   Filed Weights   01/10/19 0500 01/11/19 0500 01/12/19 0554  Weight: 72.9 kg 74.8 kg 76.1 kg    Examination:  General: 68 y.o. female resting in bed in NAD Cardiovascular: RRR, +S1, S2, no m/g/r, equal pulses throughout Respiratory: CTABL, no w/r/r, normal WOB GI: BS+, NT, distended, tight, no masses noted, no organomegaly noted Skin: No rashes, bruises, ulcerations noted Neuro: A&O x 3, no focal deficits Psyc: Appropriate interaction and affect, calm/cooperative     Data Reviewed: I have personally reviewed following labs and imaging studies.  CBC: Recent Labs  Lab 01/08/19 0630 01/09/19 0559 01/10/19 0534 01/11/19 0513 01/12/19 0522  WBC 15.3* 15.8* 11.2* 9.3 10.2  HGB 9.6* 9.8* 9.1* 9.2* 9.5*  HCT 30.3* 31.0* 28.3* 28.5* 29.7*  MCV 99.3 96.0 96.9 95.3 94.6  PLT 194 181 184 151 767   Basic Metabolic Panel: Recent Labs  Lab 01/08/19 0630 01/09/19 0559 01/10/19 0534 01/11/19 0513 01/12/19 0522  NA 139 141 141 140 139  K 4.1 4.1 3.6 3.4* 4.0  CL 106 109 108 107 108  CO2 21* 17* 20* 20* 21*  GLUCOSE 96 64* 67* 82 68*  BUN 23 24* 25* 27* 30*  CREATININE 2.04* 2.18* 1.99* 2.06* 2.14*  CALCIUM 7.1* 7.3* 7.3* 7.6* 7.7*  MG  --  1.2* 1.7 1.6* 1.6*   GFR: Estimated Creatinine Clearance: 23.8 mL/min (A) (by C-G formula based on SCr of 2.14 mg/dL (H)). Liver Function Tests: Recent Labs  Lab 01/08/19 0630 01/09/19 0559 01/10/19 0534 01/11/19  0513 01/12/19 0522  AST 56* 57* 53* 57* 55*  ALT 13 12 13 15 16   ALKPHOS 160* 160* 175* 206* 191*  BILITOT 2.3* 1.9* 1.5* 1.7* 2.1*  PROT 4.7* 4.3* 4.3* 4.6* 4.6*  ALBUMIN 1.6* 1.5* 1.5*  1.6* 1.6*   No results for input(s): LIPASE, AMYLASE in the last 168 hours. No results for input(s): AMMONIA in the last 168 hours. Coagulation Profile: No results for input(s): INR, PROTIME in the last 168 hours. Cardiac Enzymes: No results for input(s): CKTOTAL, CKMB, CKMBINDEX, TROPONINI in the last 168 hours. BNP (last 3 results) No results for input(s): PROBNP in the last 8760 hours. HbA1C: No results for input(s): HGBA1C in the last 72 hours. CBG: Recent Labs  Lab 01/11/19 0649 01/11/19 1205 01/11/19 1635 01/12/19 0757 01/12/19 1155  GLUCAP 80 83 92 68* 87   Lipid Profile: No results for input(s): CHOL, HDL, LDLCALC, TRIG, CHOLHDL, LDLDIRECT in the last 72 hours. Thyroid Function Tests: No results for input(s): TSH, T4TOTAL, FREET4, T3FREE, THYROIDAB in the last 72 hours. Anemia Panel: No results for input(s): VITAMINB12, FOLATE, FERRITIN, TIBC, IRON, RETICCTPCT in the last 72 hours. Sepsis Labs: No results for input(s): PROCALCITON, LATICACIDVEN in the last 168 hours.  Recent Results (from the past 240 hour(s))  SARS Coronavirus 2 (CEPHEID - Performed in [facility] hospital lab), Hosp Order     Status: None   Collection Time: 01/06/19  9:23 PM   Specimen: Nasopharyngeal Swab  Result Value Ref Range Status   SARS Coronavirus 2 NEGATIVE NEGATIVE Final    Comment: (NOTE) If result is NEGATIVE SARS-CoV-2 target nucleic acids are NOT DETECTED. The SARS-CoV-2 RNA is generally detectable in upper and lower  respiratory specimens during the acute phase of infection. The lowest  concentration of SARS-CoV-2 viral copies this assay can detect is 250  copies / mL. A negative result does not preclude SARS-CoV-2 infection  and should not be used as the sole basis for treatment or other   patient management decisions.  A negative result may occur with  improper specimen collection / handling, submission of specimen other  than nasopharyngeal swab, presence of viral mutation(s) within the  areas targeted by this assay, and inadequate number of viral copies  (<250 copies / mL). A negative result must be combined with clinical  observations, patient history, and epidemiological information. If result is POSITIVE SARS-CoV-2 target nucleic acids are DETECTED. The SARS-CoV-2 RNA is generally detectable in upper and lower  respiratory specimens dur ing the acute phase of infection.  Positive  results are indicative of active infection with SARS-CoV-2.  Clinical  correlation with patient history and other diagnostic information is  necessary to determine patient infection status.  Positive results do  not rule out bacterial infection or co-infection with other viruses. If result is PRESUMPTIVE POSTIVE SARS-CoV-2 nucleic acids MAY BE PRESENT.   A presumptive positive result was obtained on the submitted specimen  and confirmed on repeat testing.  While 2019 novel coronavirus  (SARS-CoV-2) nucleic acids may be present in the submitted sample  additional confirmatory testing may be necessary for epidemiological  and / or clinical management purposes  to differentiate between  SARS-CoV-2 and other Sarbecovirus currently known to infect humans.  If clinically indicated additional testing with an alternate test  methodology [phone]) is advised. The SARS-CoV-2 RNA is generally  detectable in upper and lower respiratory sp ecimens during the acute  phase of infection. The expected result is Negative. Fact Sheet for Patients:  StrictlyIdeas.no Fact Sheet for Healthcare Providers: BankingDealers.co.za This test is not yet approved or cleared by the [place] FDA and has been authorized for detection and/or diagnosis of SARS-CoV-2 by  FDA under an Emergency Use Authorization (EUA).  This EUA will remain  in effect (meaning this test can be used) for the duration of the COVID-19 declaration under Section 564(b)(1) of the Act, 21 U.S.C. section 360bbb-3(b)(1), unless the authorization is terminated or revoked sooner. Performed at [facility], [place] [address], [place], [place] [address]   Culture, blood (routine x 2)     Status: None   Collection Time: 01/07/19  5:46 PM   Specimen: BLOOD LEFT HAND  Result Value Ref Range Status   Specimen Description   Final    BLOOD LEFT HAND Performed at [facility] [address] [place], [place] [address]    Special Requests   Final    BOTTLES DRAWN AEROBIC ONLY Blood Culture results may not be optimal due to an inadequate volume of blood received in culture bottles Performed at [facility] [address], [place], [place] [address]    Culture   Final    NO GROWTH 5 DAYS Performed at [facility], [place] [address] [place], [place] [address]    Report Status 01/12/2019 FINAL  Final  Culture, blood (routine x 2)     Status: None   Collection Time: 01/07/19  5:46 PM   Specimen: BLOOD  Result Value Ref Range Status   Specimen Description   Final    BLOOD LEFT ANTECUBITAL Performed at [facility] [address], [place], [place] [address]    Special Requests   Final    BOTTLES DRAWN AEROBIC ONLY Blood Culture adequate volume Performed at [facility] [address] [place], [place] [address]    Culture   Final    NO GROWTH 5 DAYS Performed at [facility], [place] [address], [place], [place] [address]    Report Status 01/12/2019 FINAL  Final         Radiology Studies: Dg Chest 2 View  Result Date: 01/11/2019 CLINICAL DATA:  Hypoxia. EXAM: CHEST - 2 VIEW COMPARISON:  Chest x-rays dated 01/08/2019 and 01/06/2019. FINDINGS: Study is hypoinspiratory. Small  bilateral pleural effusions with probable atelectasis, better demonstrated on recent CT abdomen of 01/06/2019. No new lung findings. No evidence of superimposed pneumonia. No pneumothorax seen. Heart size and mediastinal contours are within normal limits. IMPRESSION: Low lung volumes. Small bilateral pleural effusions with probable atelectasis. Electronically Signed   By: Franki Cabot M.D.   On: 01/11/2019 16:25    Scheduled Meds: . calcium carbonate  500 mg of elemental calcium Oral BID   And  . cholecalciferol  200 Units Oral BID  . colestipol  1 g Oral BID  . escitalopram  10 mg Oral Daily  . feeding supplement  1 Container Oral BID BM  . feeding supplement (ENSURE ENLIVE)  237 mL Oral Q24H  . feeding supplement (PRO-STAT SUGAR FREE 64)  30 mL Oral BID  . levothyroxine  88 mcg Oral Q0600  . lipase/protease/amylase  36,000 Units Oral TID AC  . loperamide  2 mg Oral Daily  . multivitamin with minerals  1 tablet Oral Daily  . ondansetron (ZOFRAN) IV  4 mg Intravenous Q8H  . potassium chloride  10 mEq Oral Daily  . sodium bicarbonate  650 mg Oral BID   Continuous Infusions: . albumin human       LOS: 5 days    Time spent: 35 minutes spent in the coordination of care today.    Jonnie Finner, DO [facility] Pager [phone]  If 7PM-7AM, please contact night-coverage [email or web address]  Password [email or web address] 01/12/2019, 2:39 PM

## 2019-01-12 NOTE — Progress Notes (Signed)
Patient was complaining of "seeing people who have been dead for years and things all over the walls." She knows they are not really there, and is alert and oriented x4. Dr. Marylyn Ishihara made aware.

## 2019-01-12 NOTE — Procedures (Signed)
Ultrasound-guided diagnostic and therapeutic paracentesis performed yielding 4.5 liters of clear, yellow fluid. No immediate complications. EBL< 1 cc.

## 2019-01-12 NOTE — Progress Notes (Addendum)
SLP Cancellation Note  Patient Details Name: Kaylee Rasmussen MRN: [id number] DOB: Mar 01, 1951   Cancelled treatment:       Reason Eval/Treat Not Completed: SLP screened, no needs identified, will sign off;Other (comment)(pt seen for a swallow evaluation on Saturday July 18th and no oropharyngeal deficits noted, will sign off, please reorder if indicated)  SLP spoke to RN who reports pt with some mouth discomfort but no mechanical problem swallowing.      Luanna Salk, MS [facility] SLP Acute Rehab Services Pager [phone] Office [phone]  Macario Golds 01/12/2019, 3:10 PM

## 2019-01-12 NOTE — Progress Notes (Addendum)
Progress Note    ASSESSMENT AND PLAN:    1. Crampy diarrhea with blood / weight loss. IMPROVING. Localized inflammation at ICV on colonoscopy yesterday . Also, fecal elastase returned  very low suggesting pancreatic exocrine insufficiency so diarrhea may be multifactorial. Started Colestid BID and Creon yesterday. Stools not as liquid and no nocturnal diarrhea last night. -awaiting colon biopsies and CMV pcr  -Continue Colestid, imodium and Creon for now. Hopefully regimen can be simplified in near future.  -hopefully home some (from GI standpoint)  2. ? Cirrhosis by CT scan, ? secondary to long term use of methotrexate vr fatty liver vs other. No hx of right sided heart failure. No evidence for acute decompensation, outpatient workup appropriate. Autoimmune  / genetic markers / viral markers all negative. Ferritin ~ 500 but probably from systemic inflammation not iron overload.  -outpatient follow up appropriate. May need eventual liver biopsy  3. CAP, on Zithromycin and Rocephin  4. RA / CKD3 / chronic anemia       SUBJECTIVE   Diarrhea improving. Stools not as loose nor frequent  OBJECTIVE:     Vital signs in last 24 hours: Temp:  [98 F (36.7 C)-98.5 F (36.9 C)] 98 F (36.7 C) (07/22 0625) Pulse Rate:  [89-101] 89 (07/22 0625) Resp:  [21] 21 (07/22 0625) BP: (105-106)/(52-63) 106/63 (07/22 0625) SpO2:  [94 %-100 %] 100 % (07/22 0625) Weight:  [76.1 kg] 76.1 kg (07/22 0554) Last BM Date: 01/11/19 General:   Alert,pleasant female in NAD EENT:  Normal hearing, non icteric sclera, conjunctive pink.  Heart:  Regular rate and rhythm, pitting edema BLE  Pulm: Normal respiratory effort, Abdomen:  Soft, mildly distended, nontender.  Normal bowel sounds,.       Neurologic:  Alert and  oriented x4;  grossly normal neurologically. Psych:  Pleasant, cooperative.  Normal mood and affect.   Intake/Output from previous day: 07/21 0701 - 07/22 0700 In: 540 [P.O.:290; IV  Piggyback:250] Out: 100 [Urine:100] Intake/Output this shift: Total I/O In: 60 [P.O.:60] Out: -   Lab Results: Recent Labs    01/10/19 0534 01/11/19 0513 01/12/19 0522  WBC 11.2* 9.3 10.2  HGB 9.1* 9.2* 9.5*  HCT 28.3* 28.5* 29.7*  PLT 184 151 155   BMET Recent Labs    01/10/19 0534 01/11/19 0513 01/12/19 0522  NA 141 140 139  K 3.6 3.4* 4.0  CL 108 107 108  CO2 20* 20* 21*  GLUCOSE 67* 82 68*  BUN 25* 27* 30*  CREATININE 1.99* 2.06* 2.14*  CALCIUM 7.3* 7.6* 7.7*   LFT Recent Labs    01/12/19 0522  PROT 4.6*  ALBUMIN 1.6*  AST 55*  ALT 16  ALKPHOS 191*  BILITOT 2.1*   Principal Problem:   ARF (acute renal failure) (HCC) Active Problems:   Rheumatoid arthritis involving multiple sites with positive rheumatoid factor (HCC)   History of hypertension   History of hypothyroidism   Abnormal liver function   Diarrhea   Pulmonary nodule   Metabolic acidosis   Severe protein-calorie malnutrition (HCC)   Rectal bleeding   LFTs abnormal   Hepatic cirrhosis ([place])   Pancreatic insufficiency     LOS: 5 days   Tye Savoy ,NP 01/12/2019, 9:51 AM  GI ATTENDING  Interval history data reviewed.  Patient personally seen and examined.  Agree with interval progress note as outlined above.  Diarrhea significantly improved (for the time being) after initiating combination of Colestid (empiric) and pancreatic enzymes (question  element of pancreatic insufficiency with low fecal elastase).  Advance diet as tolerated and continue to observe for durable response to therapies.  Discussed with patient.  Docia Chuck. Geri [place]in[place]., M.D. [facility] Division of Gastroenterology

## 2019-01-12 NOTE — Progress Notes (Signed)
IC valve bxs show nonspecific chronic colitis and no CMV Could be IBD

## 2019-01-13 DIAGNOSIS — R188 Other ascites: Secondary | ICD-10-CM

## 2019-01-13 DIAGNOSIS — K529 Noninfective gastroenteritis and colitis, unspecified: Secondary | ICD-10-CM

## 2019-01-13 DIAGNOSIS — K909 Intestinal malabsorption, unspecified: Secondary | ICD-10-CM

## 2019-01-13 LAB — CBC WITH DIFFERENTIAL/PLATELET
Abs Immature Granulocytes: 0.14 10*3/uL — ABNORMAL HIGH (ref 0.00–0.07)
Basophils Absolute: 0.1 10*3/uL (ref 0.0–0.1)
Basophils Relative: 1 %
Eosinophils Absolute: 0.3 10*3/uL (ref 0.0–0.5)
Eosinophils Relative: 2 %
HCT: 29.8 % — ABNORMAL LOW (ref 36.0–46.0)
Hemoglobin: 9.3 g/dL — ABNORMAL LOW (ref 12.0–15.0)
Immature Granulocytes: 1 %
Lymphocytes Relative: 11 %
Lymphs Abs: 1.2 10*3/uL (ref 0.7–4.0)
MCH: 30.9 pg (ref 26.0–34.0)
MCHC: 31.2 g/dL (ref 30.0–36.0)
MCV: 99 fL (ref 80.0–100.0)
Monocytes Absolute: 1.1 10*3/uL — ABNORMAL HIGH (ref 0.1–1.0)
Monocytes Relative: 10 %
Neutro Abs: 8.2 10*3/uL — ABNORMAL HIGH (ref 1.7–7.7)
Neutrophils Relative %: 75 %
Platelets: 135 10*3/uL — ABNORMAL LOW (ref 150–400)
RBC: 3.01 MIL/uL — ABNORMAL LOW (ref 3.87–5.11)
RDW: 17.9 % — ABNORMAL HIGH (ref 11.5–15.5)
WBC: 11 10*3/uL — ABNORMAL HIGH (ref 4.0–10.5)
nRBC: 0 % (ref 0.0–0.2)

## 2019-01-13 LAB — RENAL FUNCTION PANEL
Albumin: 1.8 g/dL — ABNORMAL LOW (ref 3.5–5.0)
Anion gap: 9 (ref 5–15)
BUN: 31 mg/dL — ABNORMAL HIGH (ref 8–23)
CO2: 22 mmol/L (ref 22–32)
Calcium: 7.8 mg/dL — ABNORMAL LOW (ref 8.9–10.3)
Chloride: 107 mmol/L (ref 98–111)
Creatinine, Ser: 2.22 mg/dL — ABNORMAL HIGH (ref 0.44–1.00)
GFR calc Af Amer: 26 mL/min — ABNORMAL LOW (ref >60)
GFR calc non Af Amer: 22 mL/min — ABNORMAL LOW (ref >60)
Glucose, Bld: 87 mg/dL (ref 70–99)
Phosphorus: 2.6 mg/dL (ref 2.5–4.6)
Potassium: 4.3 mmol/L (ref 3.5–5.1)
Sodium: 138 mmol/L (ref 135–145)

## 2019-01-13 LAB — GLUCOSE, CAPILLARY
Glucose-Capillary: 86 mg/dL (ref 70–99)
Glucose-Capillary: 79 mg/dL (ref 70–99)
Glucose-Capillary: 90 mg/dL (ref 70–99)
Glucose-Capillary: 102 mg/dL — ABNORMAL HIGH (ref 70–99)

## 2019-01-13 LAB — MAGNESIUM: Magnesium: 1.9 mg/dL (ref 1.7–2.4)

## 2019-01-13 MED ORDER — ALBUMIN HUMAN 25 % IV SOLN
12.5000 g | Freq: Once | INTRAVENOUS | Status: AC
  Administered 2019-01-13: 12.5 g via INTRAVENOUS
  Filled 2019-01-13: qty 50

## 2019-01-13 NOTE — Care Management Important Message (Signed)
Important Message  Patient Details IM Letter given to Sharren Bridge SW to present to the Patient Name: Kaylee Rasmussen MRN: [id number] Date of Birth: [birth year]   Medicare Important Message Given:  Yes     Kerin Salen 01/13/2019, 11:35 AM

## 2019-01-13 NOTE — Progress Notes (Addendum)
Progress Note    ASSESSMENT AND PLAN:   1. Diarrhea with blood / weight loss. Possible pancreatic exocrine insufficiency and chronic colitis (nonspecific) on colon biopsies.  Diarrhea resolved with colestid, low dose imodium and Creon.  No BMs during the night , just had a partially formed stool.  -Continue Colestid, imodium and Creon for now. Hopefully regimen can be simplified in near future. Advised patient to let Nurse know if starts to develop constipation at which point would hold imodium -Can further evaluate colon biopsy findings as outpatient. Consider IBD markers. Of interest, patient was on Humira (RA) for years until last August  -hopefully home soon (from GI standpoint)  2. ? Cirrhosis by CT scan, ? secondary to long term use of methotrexate vr fatty liver vs other. No hx of right sided heart failure. No evidence for acute decompensation, outpatient workup appropriate. Autoimmune / genetic markers / viral markers all negative. Ferritin ~ 500 but probably from systemic inflammation not iron overload.  -outpatient follow up appropriate. May need eventual liver biopsy -Hospitalist ordered LVP yesterday, 4/5 L removed. No SBP. Cannot yet calculate SAAG (ascitic fluid albumin ordered separately last evening).  Cytology pending.  -Would hold off on further LVPs for now given renal dysfunction. Not able to give diuretics yet either.  -Changing her to low sodium diet.    3. CAP, completed Zithromycin and Rocephin  4. RA / CKD3 / chronic anemia      SUBJECTIVE    No further diarrhea. No abdominal pain.    OBJECTIVE:     Vital signs in last 24 hours: Temp:  [98.1 F (36.7 C)-98.2 F (36.8 C)] 98.2 F (36.8 C) (07/23 0516) Pulse Rate:  [91-95] 92 (07/23 0516) Resp:  [16-18] 18 (07/23 0516) BP: (111-131)/(53-62) 112/53 (07/23 0516) SpO2:  [91 %-98 %] 91 % (07/23 0516) Weight:  [71.3 kg] 71.3 kg (07/23 0513) Last BM Date: 01/13/19 General:   Alert,  female in  NAD EENT:  Normal hearing, non icteric sclera, conjunctive pink.  Heart:  Regular rate and rhythm, BLE pitting edema   Pulm: Normal respiratory effort Abdomen:  Soft, mildly distended, nontender.  Normal bowel sounds,.       Neurologic:  Alert and  oriented x4;  grossly normal neurologically. Psych:  Pleasant, cooperative.  Normal mood and affect.   Intake/Output from previous day: 07/22 0701 - 07/23 0700 In: 600 [P.O.:540; IV Piggyback:60] Out: -  Intake/Output this shift: No intake/output data recorded.  Lab Results: Recent Labs    01/11/19 0513 01/12/19 0522 01/13/19 0705  WBC 9.3 10.2 11.0*  HGB 9.2* 9.5* 9.3*  HCT 28.5* 29.7* 29.8*  PLT 151 155 135*   BMET Recent Labs    01/11/19 0513 01/12/19 0522 01/13/19 0705  NA 140 139 138  K 3.4* 4.0 4.3  CL 107 108 107  CO2 20* 21* 22  GLUCOSE 82 68* 87  BUN 27* 30* 31*  CREATININE 2.06* 2.14* 2.22*  CALCIUM 7.6* 7.7* 7.8*   LFT Recent Labs    01/12/19 0522  01/13/19 0705  PROT 4.6*  --   --   ALBUMIN 1.6*   < > 1.8*  AST 55*  --   --   ALT 16  --   --   ALKPHOS 191*  --   --   BILITOT 2.1*  --   --    < > = values in this interval not displayed.   PT/INR No results for input(s): LABPROT,  INR in the last 72 hours. Hepatitis Panel No results for input(s): HEPBSAG, HCVAB, HEPAIGM, HEPBIGM in the last 72 hours.  Dg Chest 2 View  Result Date: 01/11/2019 CLINICAL DATA:  Hypoxia. EXAM: CHEST - 2 VIEW COMPARISON:  Chest x-rays dated 01/08/2019 and 01/06/2019. FINDINGS: Study is hypoinspiratory. Small bilateral pleural effusions with probable atelectasis, better demonstrated on recent CT abdomen of 01/06/2019. No new lung findings. No evidence of superimposed pneumonia. No pneumothorax seen. Heart size and mediastinal contours are within normal limits. IMPRESSION: Low lung volumes. Small bilateral pleural effusions with probable atelectasis. Electronically Signed   By: Franki Cabot M.D.   On: 01/11/2019 16:25   US  Paracentesis  Result Date: 01/12/2019 INDICATION: Patient with history of cirrhosis by imaging, chronic kidney disease, ascites. Request made for diagnostic and therapeutic paracentesis. EXAM: ULTRASOUND GUIDED DIAGNOSTIC AND THERAPEUTIC PARACENTESIS MEDICATIONS: None COMPLICATIONS: None immediate. PROCEDURE: Informed written consent was obtained from the patient after a discussion of the risks, benefits and alternatives to treatment. A timeout was performed prior to the initiation of the procedure. Initial ultrasound scanning demonstrates a moderate to large amount of ascites within the right lower abdominal quadrant. The right lower abdomen was prepped and draped in the usual sterile fashion. 1% lidocaine was used for local anesthesia. Following this, a 6 Fr Safe-T-Centesis catheter was introduced. An ultrasound image was saved for documentation purposes. The paracentesis was performed. The catheter was removed and a dressing was applied. The patient tolerated the procedure well without immediate post procedural complication. FINDINGS: A total of approximately 4.5 liters of clear, yellow fluid was removed. Samples were sent to the laboratory as requested by the clinical team. IMPRESSION: Successful ultrasound-guided diagnostic and therapeutic paracentesis yielding 4.5 liters of peritoneal fluid. Read by: Rowe Robert, PA-C Electronically Signed   By: Jacqulynn Cadet M.D.   On: 01/12/2019 16:44    Principal Problem:   ARF (acute renal failure) (HCC) Active Problems:   Rheumatoid arthritis involving multiple sites with positive rheumatoid factor (HCC)   History of hypertension   History of hypothyroidism   Abnormal liver function   Diarrhea   Pulmonary nodule   Metabolic acidosis   Severe protein-calorie malnutrition (HCC)   Rectal bleeding   LFTs abnormal   Hepatic cirrhosis ([place])   Pancreatic insufficiency     LOS: 6 days   Kaylee Rasmussen ,NP 01/13/2019, 11:05 AM    Attending  physician's note   I have taken an interval history, reviewed the chart and examined the patient. I agree with the Advanced Practitioner's note, impression and recommendations.   Feeling better overall Diarrhea improving with Creon and Colestid  IC valve biopsies suggestive of mildly active chronic colitis, IBD in the differential.  CMV negative Given her symptoms are improving, will hold off on starting steroids or immunosuppressive therapy We will discuss further evaluation and management as outpatient  Cirrhosis Underwent large-volume para centesis yesterday, requested by hospitalist team.  Follow-up SAAG and cytology Do not recommend additional LVP due to her CKD and hypoalbuminemic state Creatinine is slightly worse this a.m., consider IV albumin   We will arrange for GI follow-up  post discharge  K. Denzil Magnuson , MD [phone]

## 2019-01-13 NOTE — Progress Notes (Signed)
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  She was admitted for blood in stool and chronic diarrhea and abnormal liver function. She's now s/p colonoscopy with GI, awaiting bx results. CMV pending. With worsening edema 7/21, given dose of lasix x1.   Assessment & Plan:   Principal Problem:   ARF (acute renal failure) (HCC) Active Problems:   Rheumatoid arthritis involving multiple sites with positive rheumatoid factor (HCC)   History of hypertension   History of hypothyroidism   Abnormal liver function   Diarrhea   Pulmonary nodule   Metabolic acidosis   Severe protein-calorie malnutrition (HCC)   Rectal bleeding   LFTs abnormal   Hepatic cirrhosis (HCC)   Pancreatic insufficiency   Blood in stool     - GI consult, appreciate recommendations     - Colonoscopy with localized inflammation at ileocecal valve, external and internal hemorrhoids - see report - bx taken (results pending)     - ? Recurrent/persistent CMV      - Follow CMV dna by pcr (pending)  Abnormal liver function      - Check GGT (elevated)     - Check ferritin (elevated), iron (wnl), tibc (not calc), ceruloplasmin (wnl), alpha 1 antitripsin (wnl), ana (negative), antismooth muscle ab (wnl), check anti mitochondrial antibody (wnl)     - Check MRCP -> with severe diffuse hepatic steatosis (can't exclude cirrhosis) - solitary subcentimeter R liver lesion, likely benign cyst -> recommend f/u MRI with and without IV contrast in  3-6 months     - ? 2/2 methotrexate [place] vs fatty liver vs other per GI     - Will need further w/u outpatient   Chronic diarrhea     - Check celiac panel (gliadin antibodies - wnl, TTG - normal, and reticulin antibody pending)     - Cholestyramine prn     - Trial of creon      - Appreciate GI recs  Community Acquired Pneumonia  Fever:      - Bld Cx Neg     - CXR with patchy airspace dz in L lung base (early pneumonia vs sequelae of aspiration)     - Repeat 2 view CXR with small bilateral effusions and associated atelectasis vs consolidation     - Ceftriaxone/azithro x 5 days  AKI on CKD III     - creatinine worsened in setting of soft BP's on 7/18.       - Peaked at 2.18; baseline creatinine appears to be ~1.5, continue to monitor.     - No hydro on imaging from earlier in hospitalization  Right Greater than Left Effusions  Moderate Volume Ascites:      - s/p paracentesis w/ 4.5L removed     - reports feeling much better today and wants to eat     - monitor renal function (essentially unmoved from yesterday; s/p 1 dose albumin w/ paracentesis; will repeat)  Hypotension/Hx of Hypertension     - BP low normal, continue holding antihypertensives for now  Non- AG acidosis     - Improved, d/c IV  bicarb     - Pt on PO bicarb; acidosis resolved, monitor  N/V     - Zofran 4mg  iv q6h prn     - improved after paracentesis  Hypothyroidism     - Cont Levothyroxine 88 micrograms po qday  Anxiety     - Cont Lexapro 10mg  po qday  Normocytic Anemia     - stable, Hgb 9.5, no frank bleed noted  RA     - Off Humira, follows with Deveshwar     - Voltaren gel prn  Pulmonary Nodule     - outpatient follow up  Right greater than Left LEE     - RLE without DVT.  Hypoglycemia     - poor PO intake; encourage PO intake     - says she feels bloated and stomach is tight; will ask IR to eval for paracentesis     - now s/p paracentesis; says that she feels hungry and wants  to eat; encourage diet  Hypomagnesemia     - replete, monitor  Debility     - will ask PT to eval     - PT recs SNF   DVT prophylaxis: SCDs Code Status: FULL Disposition Plan: TBD   Consultants:   GI  IR    Subjective: "I feel TONS better. Thank you."  Objective: Vitals:   01/12/19 1742 01/12/19 2124 01/13/19 0513 01/13/19 0516  BP: (!) 111/55 (!) 112/58  (!) 112/53  Pulse: 92 91  92  Resp:  16  18  Temp:  98.1 F (36.7 C)  98.2 F (36.8 C)  TempSrc:  Oral  Oral  SpO2: 94% 98%  91%  Weight:   71.3 kg   Height:        Intake/Output Summary (Last 24 hours) at 01/13/2019 1447 Last data filed at 01/13/2019 0600 Gross per 24 hour  Intake 480 ml  Output -  Net 480 ml   Filed Weights   01/11/19 0500 01/12/19 0554 01/13/19 0513  Weight: 74.8 kg 76.1 kg 71.3 kg    Examination:  General: 68 y.o. female resting in bed in NAD Cardiovascular: RRR, +S1, S2, no m/g/r, equal pulses throughout Respiratory: CTABL, no w/r/r, normal WOB GI: BS+, NT, distended, soft, no masses noted, no organomegaly noted Skin: No rashes, bruises, ulcerations noted Neuro: A&O x 3, no focal deficits Psyc: Appropriate interaction and affect, calm/cooperative     Data Reviewed: I have personally reviewed following labs and imaging studies.  CBC: Recent Labs  Lab 01/09/19 0559 01/10/19 0534 01/11/19 0513 01/12/19 0522 01/13/19 0705  WBC 15.8* 11.2* 9.3 10.2 11.0*  NEUTROABS  --   --   --   --  8.2*  HGB 9.8* 9.1* 9.2* 9.5* 9.3*  HCT 31.0* 28.3* 28.5* 29.7* 29.8*  MCV 96.0 96.9 95.3 94.6 99.0  PLT 181 184 151 155 563*   Basic Metabolic Panel: Recent Labs  Lab 01/09/19 0559 01/10/19 0534 01/11/19 0513 01/12/19 0522 01/13/19 0705  NA 141 141 140 139 138  K 4.1 3.6 3.4* 4.0 4.3  CL 109 108 107 108 107  CO2 17* 20* 20* 21* 22  GLUCOSE 64* 67* 82 68* 87  BUN 24* 25* 27* 30* 31*  CREATININE 2.18* 1.99* 2.06* 2.14* 2.22*  CALCIUM 7.3* 7.3* 7.6* 7.7* 7.8*  MG 1.2* 1.7  1.6* 1.6* 1.9  PHOS  --   --   --   --  2.6   GFR: Estimated Creatinine Clearance: 22.2 mL/min (A) (by  C-G formula based on SCr of 2.22 mg/dL (H)). Liver Function Tests: Recent Labs  Lab 01/08/19 0630 01/09/19 0559 01/10/19 0534 01/11/19 0513 01/12/19 0522 01/12/19 2115 01/13/19 0705  AST 56* 57* 53* 57* 55*  --   --   ALT 13 12 13 15 16   --   --   ALKPHOS 160* 160* 175* 206* 191*  --   --   BILITOT 2.3* 1.9* 1.5* 1.7* 2.1*  --   --   PROT 4.7* 4.3* 4.3* 4.6* 4.6*  --   --   ALBUMIN 1.6* 1.5* 1.5* 1.6* 1.6* 1.8* 1.8*   No results for input(s): LIPASE, AMYLASE in the last 168 hours. Recent Labs  Lab 01/12/19 2115  AMMONIA 68*   Coagulation Profile: No results for input(s): INR, PROTIME in the last 168 hours. Cardiac Enzymes: No results for input(s): CKTOTAL, CKMB, CKMBINDEX, TROPONINI in the last 168 hours. BNP (last 3 results) No results for input(s): PROBNP in the last 8760 hours. HbA1C: No results for input(s): HGBA1C in the last 72 hours. CBG: Recent Labs  Lab 01/12/19 0757 01/12/19 1155 01/13/19 0643 01/13/19 0753 01/13/19 1208  GLUCAP 68* 87 86 79 90   Lipid Profile: No results for input(s): CHOL, HDL, LDLCALC, TRIG, CHOLHDL, LDLDIRECT in the last 72 hours. Thyroid Function Tests: No results for input(s): TSH, T4TOTAL, FREET4, T3FREE, THYROIDAB in the last 72 hours. Anemia Panel: No results for input(s): VITAMINB12, FOLATE, FERRITIN, TIBC, IRON, RETICCTPCT in the last 72 hours. Sepsis Labs: No results for input(s): PROCALCITON, LATICACIDVEN in the last 168 hours.  Recent Results (from the past 240 hour(s))  SARS Coronavirus 2 (CEPHEID - Performed in [place] hospital lab), Hosp Order     Status: None   Collection Time: 01/06/19  9:23 PM   Specimen: Nasopharyngeal Swab  Result Value Ref Range Status   SARS Coronavirus 2 NEGATIVE NEGATIVE Final    Comment: (NOTE) If result is NEGATIVE SARS-CoV-2 target nucleic acids are NOT DETECTED. The  SARS-CoV-2 RNA is generally detectable in upper and lower  respiratory specimens during the acute phase of infection. The lowest  concentration of SARS-CoV-2 viral copies this assay can detect is 250  copies / mL. A negative result does not preclude SARS-CoV-2 infection  and should not be used as the sole basis for treatment or other  patient management decisions.  A negative result may occur with  improper specimen collection / handling, submission of specimen other  than nasopharyngeal swab, presence of viral mutation(s) within the  areas targeted by this assay, and inadequate number of viral copies  (<250 copies / mL). A negative result must be combined with clinical  observations, patient history, and epidemiological information. If result is POSITIVE SARS-CoV-2 target nucleic acids are DETECTED. The SARS-CoV-2 RNA is generally detectable in upper and lower  respiratory specimens dur ing the acute phase of infection.  Positive  results are indicative of active infection with SARS-CoV-2.  Clinical  correlation with patient history and other diagnostic information is  necessary to determine patient infection status.  Positive results do  not rule out bacterial infection or co-infection with other viruses. If result is PRESUMPTIVE POSTIVE SARS-CoV-2 nucleic acids MAY BE PRESENT.   A presumptive positive result was obtained on the submitted specimen  and confirmed on repeat testing.  While 2019 novel coronavirus  (SARS-CoV-2) nucleic acids may be present in the submitted sample  additional confirmatory testing may be necessary for epidemiological  and / or clinical management purposes  to differentiate between  SARS-CoV-2 and other Sarbecovirus currently known to infect humans.  If clinically indicated additional testing with an alternate test  methodology [phone]) is advised. The SARS-CoV-2 RNA is generally  detectable in upper and lower respiratory sp ecimens during the acute   phase of infection. The expected result is Negative. Fact Sheet for Patients:  StrictlyIdeas.no Fact Sheet for Healthcare Providers: BankingDealers.co.za This test is not yet approved or cleared by the [place] FDA and has been authorized for detection and/or diagnosis of SARS-CoV-2 by FDA under an Emergency Use Authorization (EUA).  This EUA will remain in effect (meaning this test can be used) for the duration of the COVID-19 declaration under Section 564(b)(1) of the Act, 21 U.S.C. section 360bbb-3(b)(1), unless the authorization is terminated or revoked sooner. Performed at [facility], [place] [address] [place], [place] [address]   Culture, blood (routine x 2)     Status: None   Collection Time: 01/07/19  5:46 PM   Specimen: BLOOD LEFT HAND  Result Value Ref Range Status   Specimen Description   Final    BLOOD LEFT HAND Performed at [facility] [address] [place], [place] [address]    Special Requests   Final    BOTTLES DRAWN AEROBIC ONLY Blood Culture results may not be optimal due to an inadequate volume of blood received in culture bottles Performed at [facility] [address] [place], [place] [address]    Culture   Final    NO GROWTH 5 DAYS Performed at [facility], [place] [address] [place], [place] [address]    Report Status 01/12/2019 FINAL  Final  Culture, blood (routine x 2)     Status: None   Collection Time: 01/07/19  5:46 PM   Specimen: BLOOD  Result Value Ref Range Status   Specimen Description   Final    BLOOD LEFT ANTECUBITAL Performed at [facility] [address], [place], [place] [address]    Special Requests   Final    BOTTLES DRAWN AEROBIC ONLY Blood Culture adequate volume Performed at [facility] [address] [place], [place] [address]    Culture   Final    NO GROWTH 5 DAYS  Performed at [facility], [place] [address] [place], [place] [address]    Report Status 01/12/2019 FINAL  Final  Culture, body fluid-bottle     Status: None (Preliminary result)   Collection Time: 01/12/19  4:17 PM   Specimen: Fluid  Result Value Ref Range Status   Specimen Description FLUID PERITONEAL  Final   Special Requests BOTTLES DRAWN AEROBIC AND ANAEROBIC  Final   Culture   Final    NO GROWTH < 12 HOURS Performed at [facility], [place] [address], [place], [place] [address]    Report Status PENDING  Incomplete  Gram stain     Status: None   Collection Time: 01/12/19  4:17 PM   Specimen: Fluid  Result Value Ref Range Status   Specimen Description FLUID PERITONEAL  Final   Special Requests NONE  Final   Gram Stain   Final    MODERATE WBC PRESENT, PREDOMINANTLY MONONUCLEAR NO ORGANISMS SEEN Performed at [facility], [place] [address], [place], [place] [address]    Report Status 01/12/2019 FINAL  Final         Radiology Studies: Dg Chest 2 View  Result Date: 01/11/2019 CLINICAL  DATA:  Hypoxia. EXAM: CHEST - 2 VIEW COMPARISON:  Chest x-rays dated 01/08/2019 and 01/06/2019. FINDINGS: Study is hypoinspiratory. Small bilateral pleural effusions with probable atelectasis, better demonstrated on recent CT abdomen of 01/06/2019. No new lung findings. No evidence of superimposed pneumonia. No pneumothorax seen. Heart size and mediastinal contours are within normal limits. IMPRESSION: Low lung volumes. Small bilateral pleural effusions with probable atelectasis. Electronically Signed   By: Franki Cabot M.D.   On: 01/11/2019 16:25   US Paracentesis  Result Date: 01/12/2019 INDICATION: Patient with history of cirrhosis by imaging, chronic kidney disease, ascites. Request made for diagnostic and therapeutic paracentesis. EXAM: ULTRASOUND GUIDED DIAGNOSTIC AND THERAPEUTIC PARACENTESIS MEDICATIONS: None COMPLICATIONS: None immediate. PROCEDURE: Informed written consent was  obtained from the patient after a discussion of the risks, benefits and alternatives to treatment. A timeout was performed prior to the initiation of the procedure. Initial ultrasound scanning demonstrates a moderate to large amount of ascites within the right lower abdominal quadrant. The right lower abdomen was prepped and draped in the usual sterile fashion. 1% lidocaine was used for local anesthesia. Following this, a 6 Fr Safe-T-Centesis catheter was introduced. An ultrasound image was saved for documentation purposes. The paracentesis was performed. The catheter was removed and a dressing was applied. The patient tolerated the procedure well without immediate post procedural complication. FINDINGS: A total of approximately 4.5 liters of clear, yellow fluid was removed. Samples were sent to the laboratory as requested by the clinical team. IMPRESSION: Successful ultrasound-guided diagnostic and therapeutic paracentesis yielding 4.5 liters of peritoneal fluid. Read by: Rowe Robert, PA-C Electronically Signed   By: Jacqulynn Cadet M.D.   On: 01/12/2019 16:44        Scheduled Meds: . calcium carbonate  500 mg of elemental calcium Oral BID   And  . cholecalciferol  200 Units Oral BID  . colestipol  1 g Oral BID  . escitalopram  10 mg Oral Daily  . feeding supplement  1 Container Oral BID BM  . feeding supplement (ENSURE ENLIVE)  237 mL Oral Q24H  . feeding supplement (PRO-STAT SUGAR FREE 64)  30 mL Oral BID  . levothyroxine  88 mcg Oral Q0600  . lipase/protease/amylase  36,000 Units Oral TID AC  . loperamide  2 mg Oral Daily  . magnesium oxide  400 mg Oral BID  . multivitamin with minerals  1 tablet Oral Daily  . ondansetron (ZOFRAN) IV  4 mg Intravenous Q8H  . potassium chloride  10 mEq Oral Daily  . sodium bicarbonate  650 mg Oral BID   Continuous Infusions: . albumin human       LOS: 6 days    Time spent: 25 minutes spent in the coordination of care today.    Jonnie Finner,  DO [facility] Pager [phone]  If 7PM-7AM, please contact night-coverage [email or web address] Password [facility] 01/13/2019, 2:47 PM

## 2019-01-13 NOTE — Evaluation (Addendum)
Physical Therapy Evaluation Patient Details Name: Kaylee Rasmussen MRN: [id number] DOB: 1950-07-10 Today's Date: 01/13/2019   History of Present Illness  68 yo female admitted to ED 7/16 with rectal bleeding, CT abdomen pelvis revealing ? liver cirrhosis, ascites, small pleural effusions. Colonoscopy 7/19 reveals internal and external hemorrhoids, diverticulosis. 7/22 paracentesis yielding 4.5L clear yellow fluid. PMH includes HTN, HLD, asthma, RA, necrotizing glomerulonephritis/vasculitis, pancytopenia, chronic diarrhea with N/V.  Clinical Impression   Pt presents with abdominal pain, generalized weakness, difficulty performing mobility tasks, decreased activity tolerance, and inability to ambulate this session due to general fatigue and weakness. Pt to benefit from acute PT to address deficits. Pt transferred to and from [facility] only, requiring min-mod assist for mobility. Pt states her husband assists her at home, and reports multiple falls due to weakness and back pain. PT recommending SNF, pt states she would like to go home and "see if I get better". PT to progress mobility as tolerated, and will continue to follow acutely.    Dyspnea 3/4 SpO2 on 2LO2: 98% HR: up to 133 bpm during bed mobility    Follow Up Recommendations SNF    Equipment Recommendations  None recommended by PT    Recommendations for Other Services       Precautions / Restrictions Precautions Precautions: Fall Restrictions Weight Bearing Restrictions: No      Mobility  Bed Mobility Overal bed mobility: Needs Assistance Bed Mobility: Supine to Sit;Sit to Supine     Supine to sit: Min assist;HOB elevated Sit to supine: Mod assist;HOB elevated   General bed mobility comments: Min assist for supine to sit for LE management, trunk elevation with HHA to help pt assist self in power up. Mod assist for sit to supine for lifting LEs into bed, scooting up in bed, positioning trunk.  Transfers Overall transfer level:  Needs assistance Equipment used: 1 person hand held assist Transfers: Sit to/from [employer] Sit to Stand: Min assist Stand pivot transfers: Min assist       General transfer comment: Min assist for power up, steadying. Pt with heavy anterior leaning with standing, stand pivot to and from [facility] with min assist for steadying and slow eccentric lowering.  Ambulation/Gait Ambulation/Gait assistance: (Pt defers - states she is too fatigued)              Stairs            Wheelchair Mobility    Modified Rankin (Stroke Patients Only)       Balance Overall balance assessment: Needs assistance;History of Falls Sitting-balance support: No upper extremity supported       Standing balance support: Single extremity supported Standing balance-Leahy Scale: Poor                               Pertinent Vitals/Pain Pain Assessment: Faces Faces Pain Scale: Hurts little more Pain Location: abdomen Pain Descriptors / Indicators: Discomfort Pain Intervention(s): Limited activity within patient's tolerance;Monitored during session;Repositioned    Home Living Family/patient expects to be discharged to:: Private residence Living Arrangements: Spouse/significant other Available Help at Discharge: Family Type of Home: House Home Access: Stairs to enter Entrance Stairs-Rails: Left Entrance Stairs-Number of Steps: 2 Home Layout: One level Home Equipment: Environmental consultant - 2 wheels;Wheelchair - manual      Prior Function Level of Independence: Needs assistance   Gait / Transfers Assistance Needed: Pt reports using RW for ambulation PTA, states she could only  walk to bathroom and back and would have falls when doing this. Pt states her husband assists her with walking,and pt uses wheelchair in community as needed.  ADL's / Homemaking Assistance Needed: Pt states her husband assisted her with dressing, bathing, cooking, and cleaning PTA.        Hand  Dominance   Dominant Hand: Right    Extremity/Trunk Assessment   Upper Extremity Assessment Upper Extremity Assessment: Generalized weakness    Lower Extremity Assessment Lower Extremity Assessment: Generalized weakness    Cervical / Trunk Assessment Cervical / Trunk Assessment: Kyphotic  Communication   Communication: No difficulties  Cognition Arousal/Alertness: Awake/alert Behavior During Therapy: WFL for tasks assessed/performed;Flat affect Overall Cognitive Status: Within Functional Limits for tasks assessed                                 General Comments: Pt difficult to motivate to participate in PT      General Comments      Exercises     Assessment/Plan    PT Assessment Patient needs continued PT services  PT Problem List Decreased strength;Decreased activity tolerance;Decreased balance;Decreased knowledge of use of DME;Decreased mobility       PT Treatment Interventions DME instruction;Therapeutic activities;Gait training;Therapeutic exercise;Patient/family education;Balance training;Functional mobility training    PT Goals (Current goals can be found in the Care Plan section)  Acute Rehab PT Goals Patient Stated Goal: go home, get better PT Goal Formulation: With patient Time For Goal Achievement: 01/27/19 Potential to Achieve Goals: Good    Frequency Min 2X/week   Barriers to discharge        Co-evaluation               AM-PAC PT "6 Clicks" Mobility  Outcome Measure Help needed turning from your back to your side while in a flat bed without using bedrails?: A Lot Help needed moving from lying on your back to sitting on the side of a flat bed without using bedrails?: A Lot Help needed moving to and from a bed to a chair (including a wheelchair)?: A Little Help needed standing up from a chair using your arms (e.g., wheelchair or bedside chair)?: A Little Help needed to walk in hospital room?: A Lot Help needed climbing 3-5  steps with a railing? : A Lot 6 Click Score: 14    End of Session Equipment Utilized During Treatment: Gait belt Activity Tolerance: Patient limited by fatigue;Patient limited by pain Patient left: in bed;with bed alarm set;with call bell/phone within reach Nurse Communication: Mobility status PT Visit Diagnosis: Other abnormalities of gait and mobility (R26.89);Muscle weakness (generalized) (M62.81);History of falling (Z91.81)    Time: 1610-9604 PT Time Calculation (min) (ACUTE ONLY): 21 min   Charges:   PT Evaluation $PT Eval Low Complexity: 1 Low          Ordean Fouts Terrial Rhodes, PT Acute Rehabilitation Services Pager [phone]  Office [phone]  Jahquan Klugh D Zakiya Sporrer 01/13/2019, 1:27 PM

## 2019-01-14 ENCOUNTER — Telehealth: Payer: PPO | Admitting: Gastroenterology

## 2019-01-14 DIAGNOSIS — N179 Acute kidney failure, unspecified: Secondary | ICD-10-CM

## 2019-01-14 DIAGNOSIS — R188 Other ascites: Secondary | ICD-10-CM

## 2019-01-14 LAB — CBC WITH DIFFERENTIAL/PLATELET
Abs Immature Granulocytes: 0.12 10*3/uL — ABNORMAL HIGH (ref 0.00–0.07)
Basophils Absolute: 0.1 10*3/uL (ref 0.0–0.1)
Basophils Relative: 1 %
Eosinophils Absolute: 0.4 10*3/uL (ref 0.0–0.5)
Eosinophils Relative: 3 %
HCT: 30.1 % — ABNORMAL LOW (ref 36.0–46.0)
Hemoglobin: 9.5 g/dL — ABNORMAL LOW (ref 12.0–15.0)
Immature Granulocytes: 1 %
Lymphocytes Relative: 10 %
Lymphs Abs: 1.1 10*3/uL (ref 0.7–4.0)
MCH: 30.7 pg (ref 26.0–34.0)
MCHC: 31.6 g/dL (ref 30.0–36.0)
MCV: 97.4 fL (ref 80.0–100.0)
Monocytes Absolute: 1.3 10*3/uL — ABNORMAL HIGH (ref 0.1–1.0)
Monocytes Relative: 11 %
Neutro Abs: 8.1 10*3/uL — ABNORMAL HIGH (ref 1.7–7.7)
Neutrophils Relative %: 74 %
Platelets: 133 10*3/uL — ABNORMAL LOW (ref 150–400)
RBC: 3.09 MIL/uL — ABNORMAL LOW (ref 3.87–5.11)
RDW: 18 % — ABNORMAL HIGH (ref 11.5–15.5)
WBC: 11 10*3/uL — ABNORMAL HIGH (ref 4.0–10.5)
nRBC: 0 % (ref 0.0–0.2)

## 2019-01-14 LAB — RENAL FUNCTION PANEL
Albumin: 2.1 g/dL — ABNORMAL LOW (ref 3.5–5.0)
Anion gap: 11 (ref 5–15)
BUN: 34 mg/dL — ABNORMAL HIGH (ref 8–23)
CO2: 21 mmol/L — ABNORMAL LOW (ref 22–32)
Calcium: 8 mg/dL — ABNORMAL LOW (ref 8.9–10.3)
Chloride: 107 mmol/L (ref 98–111)
Creatinine, Ser: 2.19 mg/dL — ABNORMAL HIGH (ref 0.44–1.00)
GFR calc Af Amer: 26 mL/min — ABNORMAL LOW (ref >60)
GFR calc non Af Amer: 23 mL/min — ABNORMAL LOW (ref >60)
Glucose, Bld: 87 mg/dL (ref 70–99)
Phosphorus: 2 mg/dL — ABNORMAL LOW (ref 2.5–4.6)
Potassium: 4.6 mmol/L (ref 3.5–5.1)
Sodium: 139 mmol/L (ref 135–145)

## 2019-01-14 LAB — GLUCOSE, CAPILLARY
Glucose-Capillary: 86 mg/dL (ref 70–99)
Glucose-Capillary: 85 mg/dL (ref 70–99)
Glucose-Capillary: 81 mg/dL (ref 70–99)

## 2019-01-14 LAB — MAGNESIUM: Magnesium: 2 mg/dL (ref 1.7–2.4)

## 2019-01-14 LAB — CMV DNA BY PCR, QUALITATIVE: CMV DNA, Qual PCR: NEGATIVE

## 2019-01-14 MED ORDER — NYSTATIN 100000 UNIT/ML MT SUSP
5.0000 mL | Freq: Four times a day (QID) | OROMUCOSAL | Status: AC
  Administered 2019-01-14 – 2019-01-15 (×3): 500000 [IU] via ORAL
  Filled 2019-01-14 (×4): qty 5

## 2019-01-14 MED ORDER — POTASSIUM PHOSPHATE MONOBASIC 500 MG PO TABS
500.0000 mg | ORAL_TABLET | Freq: Three times a day (TID) | ORAL | Status: DC
  Administered 2019-01-14 – 2019-01-16 (×5): 500 mg via ORAL
  Filled 2019-01-14 (×12): qty 1

## 2019-01-14 MED ORDER — SODIUM CHLORIDE 0.9 % IV SOLN
Freq: Once | INTRAVENOUS | Status: AC
  Administered 2019-01-14: 13:00:00 via INTRAVENOUS

## 2019-01-14 NOTE — TOC Initial Note (Signed)
Transition of Care [facility]) - Initial/Assessment Note    Patient Details  Name: Kaylee Rasmussen MRN: [id number] Date of Birth: 1951/02/11  Transition of Care [facility]) CM/SW Contact:    Dessa Phi, RN Phone Number: 01/14/2019, 2:30 PM  Clinical Narrative: PT/OT-recc SNf.Patient agreed to fax out.attending also stated patient agreed to fax out for SNF. TC HTA rep Crystal-they will review for auth or med review-await response. Also f/u on [facility] agencies as a [place] unable to accept. Await [facility] response.                  Expected Discharge Plan: Skilled Nursing Facility Barriers to Discharge: Continued Medical Work up   Patient Goals and CMS Choice Patient states their goals for this hospitalization and ongoing recovery are:: go home CMS Medicare.gov Compare Post Acute Care list provided to:: Patient Choice offered to / list presented to : Spouse, Patient  Expected Discharge Plan and Services Expected Discharge Plan: [place]   Discharge Planning Services: CM Consult Post Acute Care Choice: [place] arrangements for the past 2 months: Single Family Home Expected Discharge Date: 01/08/19                                    Prior Living Arrangements/Services Living arrangements for the past 2 months: Single Family Home Lives with:: Spouse Patient language and need for interpreter reviewed:: Yes Do you feel safe going back to the place where you live?: Yes      Need for Family Participation in Patient Care: No (Comment) Care giver support system in place?: Yes (comment)   Criminal Activity/Legal Involvement Pertinent to Current Situation/Hospitalization: No - Comment as needed  Activities of Daily Living Home Assistive Devices/Equipment: Environmental consultant (specify type), Eyeglasses, Wheelchair ADL Screening (condition at time of admission) Patient's cognitive ability adequate to safely complete daily activities?: Yes Is the  patient deaf or have difficulty hearing?: No Does the patient have difficulty seeing, even when wearing glasses/contacts?: No Does the patient have difficulty concentrating, remembering, or making decisions?: No Patient able to express need for assistance with ADLs?: Yes Does the patient have difficulty dressing or bathing?: Yes Independently performs ADLs?: Yes (appropriate for developmental age) Does the patient have difficulty walking or climbing stairs?: Yes Weakness of Legs: Both Weakness of Arms/Hands: None  Permission Sought/Granted Permission sought to share information with : Case Manager Permission granted to share information with : Yes, Verbal Permission Granted  Share Information with NAME: Verdine Grenfell spuse  Permission granted to share info w AGENCY: [facility]  Permission granted to share info w Relationship: spouse  Permission granted to share info w Contact Information: [phone]  Emotional Assessment Appearance:: Appears stated age Attitude/Demeanor/Rapport: Gracious Affect (typically observed): Accepting Orientation: : Oriented to Self, Oriented to Place, Oriented to  Time, Oriented to Situation Alcohol / Substance Use: Never Used Psych Involvement: No (comment)  Admission diagnosis:  Hyperbilirubinemia [X38.1] Metabolic acidosis [W29.9] Pulmonary nodule [R91.1] Other ascites [R18.8] AKI (acute kidney injury) ([place]) [N17.9] Patient Active Problem List   Diagnosis Date Noted  . Ascites   . LFTs abnormal   . Hepatic cirrhosis ([place])   . Pancreatic insufficiency   . Rectal bleeding   . Severe protein-calorie malnutrition ([place]) 01/08/2019  . Abnormal liver function 01/07/2019  . Diarrhea 01/07/2019  . Pulmonary nodule 01/07/2019  . Metabolic acidosis 37/16/9678  . ARF (acute renal failure) ([place])  01/06/2019  . Rheumatoid arthritis involving multiple sites with positive rheumatoid factor ([place]) 08/27/2017  . High risk medication use 08/27/2017  . Primary  osteoarthritis of both knees 08/27/2017  . History of total hip replacement, right 08/27/2017  . Renal calcinosis 08/27/2017  . History of hypertension 08/27/2017  . History of hypothyroidism 08/27/2017  . History of hypercholesterolemia 08/27/2017   PCP:  Nicoletta Dress, MD Pharmacy:   [facility], [place] [address] Phone: [phone] Fax: [phone]     Social Determinants of Health (SDOH) Interventions    Readmission Risk Interventions No flowsheet data found.

## 2019-01-14 NOTE — Progress Notes (Signed)
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  She was admitted for blood in stool and chronic diarrhea and abnormal liver function. She's now s/p colonoscopy with GI, awaiting bx results. CMV pending. With worsening edema 7/21, given dose of lasix x1.   Assessment & Plan:   Principal Problem:   ARF (acute renal failure) (HCC) Active Problems:   Rheumatoid arthritis involving multiple sites with positive rheumatoid factor (HCC)   History of hypertension   History of hypothyroidism   Abnormal liver function   Diarrhea   Pulmonary nodule   Metabolic acidosis   Severe protein-calorie malnutrition (HCC)   Rectal bleeding   LFTs abnormal   Hepatic cirrhosis (HCC)   Pancreatic insufficiency   Blood in stool - GI consult, appreciate recommendations - Colonoscopy with localized inflammation at ileocecal valve, external and internal hemorrhoids - see report - bx taken (results pending) - ? Recurrent/persistent CMV  - CMV negative  Abnormal liver function  - Check GGT (elevated) - Check ferritin (elevated), iron (wnl), tibc (not calc), ceruloplasmin (wnl), alpha 1 antitripsin (wnl), ana (negative), antismooth muscle ab (wnl), check anti mitochondrial antibody (wnl) - Check MRCP ->with severe diffuse hepatic steatosis (can't exclude cirrhosis) - solitary subcentimeter R liver lesion, likely benign cyst ->recommend f/u MRI with and without IV contrast in 3-6 months - ?  2/2 methotrexate [place] vs fatty liver vs other per GI - Will need further w/u outpatient   Chronic diarrhea - Check celiac panel (gliadin antibodies - wnl, TTG - normal, and reticulin antibody pending) - Cholestyramine prn - Trial of creon  - Appreciate GI recs     - resolved with colestid BID, imodium, and creon  Community Acquired Pneumonia   Fever:  - Bld Cx Neg - CXR with patchy airspace dz in L lung base (early pneumonia vs sequelae of aspiration) - Repeat 2 view CXR with small bilateral effusions and associated atelectasis vs consolidation - Ceftriaxone/azithro x 5 days  AKI on CKD III - creatinine worsened in setting of soft BP's on 7/18.  - baseline creatinine appears to be ~1.5, continue to monitor. - No hydro on imaging from earlier in hospitalization     - renal function stable at about 2.1.   Right Greater than Left Effusions   Moderate Volume Ascites:  - s/p paracentesis w/ 4.5L removed     - reports feeling much better today and wants to eat     - monitor renal function (essentially unmoved from; s/p 2 dose albumin)  Hypotension/Hx of Hypertension - BP low normal, continue holding antihypertensives for now  Non- AG acidosis - Improved, d/c IV bicarb - Pt on PO bicarb; acidosis resolved, monitor  N/V - Zofran 4mg  iv q6h prn     - improved after paracentesis  Hypothyroidism - Cont Levothyroxine 88 micrograms po qday  Anxiety - Cont Lexapro 10mg  po qday  Normocytic Anemia - stable, Hgb 9.5, no frank bleed noted  RA - Off Humira, follows with Deveshwar - Voltaren gel prn  Pulmonary Nodule - outpatient follow up  Right greater than Left LEE - RLE without  DVT.  Hypoglycemia - poor PO intake; encourage PO intake - says she feels bloated and stomach is tight; will ask IR to eval for paracentesis     - now s/p paracentesis; says that she  feels hungry and wants to eat; encourage diet     - continue encouraging diet  Hypomagnesemia/Hypophospatemia - replete, monitor  Debility - will ask PT to eval     - PT recs SNF; spoke with patient, and she has agreed to SNF rehab   DVT prophylaxis:SCDs Code Status:FULL Disposition Plan:TBD   Consultants:  GI  IR  Subjective: "I ate really good last night."  Objective: Vitals:   01/13/19 0516 01/13/19 1840 01/13/19 2126 01/14/19 0430  BP: (!) 112/53 105/61 119/66 (!) 115/53  Pulse: 92 86 89 81  Resp: 18  16 (!) 22  Temp: 98.2 F (36.8 C) 98.3 F (36.8 C) 98.8 F (37.1 C) 98.5 F (36.9 C)  TempSrc: Oral Oral Oral Oral  SpO2: 91%  95% 95%  Weight:    71.5 kg  Height:        Intake/Output Summary (Last 24 hours) at 01/14/2019 0748 Last data filed at 01/14/2019 0645 Gross per 24 hour  Intake 170.02 ml  Output --  Net 170.02 ml   Filed Weights   01/12/19 0554 01/13/19 0513 01/14/19 0430  Weight: 76.1 kg 71.3 kg 71.5 kg    Examination:  General:68 y.o.femaleresting in bed in NAD Cardiovascular: RRR, +S1, S2, no m/g/r, equal pulses throughout Respiratory: CTABL, no w/r/r, normal WOB GI: BS+, NT,distended, soft,no masses noted, no organomegaly noted Skin: No rashes, bruises, ulcerations noted Neuro: A&O x 3, no focal deficits Psyc: Appropriate interaction and affect, calm/cooperative      Data Reviewed: I have personally reviewed following labs and imaging studies.  CBC: Recent Labs  Lab 01/10/19 0534 01/11/19 0513 01/12/19 0522 01/13/19 0705 01/14/19 0533  WBC 11.2* 9.3 10.2 11.0* 11.0*  NEUTROABS  --   --   --  8.2* 8.1*  HGB 9.1* 9.2* 9.5* 9.3* 9.5*  HCT 28.3* 28.5* 29.7* 29.8* 30.1*  MCV 96.9 95.3 94.6 99.0 97.4  PLT 184 151 155 135* 700*   Basic Metabolic Panel: Recent Labs  Lab 01/10/19 0534 01/11/19 0513 01/12/19 0522 01/13/19 0705 01/14/19 0533  NA 141 140 139 138 139  K 3.6 3.4* 4.0 4.3 4.6  CL 108  107 108 107 107  CO2 20* 20* 21* 22 21*  GLUCOSE 67* 82 68* 87 87  BUN 25* 27* 30* 31* 34*  CREATININE 1.99* 2.06* 2.14* 2.22* 2.19*  CALCIUM 7.3* 7.6* 7.7* 7.8* 8.0*  MG 1.7 1.6* 1.6* 1.9 2.0  PHOS  --   --   --  2.6 2.0*   GFR: Estimated Creatinine Clearance: 22.5 mL/min (A) (by C-G formula based on SCr of 2.19 mg/dL (H)). Liver Function Tests: Recent Labs  Lab 01/08/19 0630 01/09/19 0559 01/10/19 0534 01/11/19 0513 01/12/19 0522 01/12/19 2115 01/13/19 0705 01/14/19 0533  AST 56* 57* 53* 57* 55*  --   --   --   ALT 13 12 13 15 16   --   --   --   ALKPHOS 160* 160* 175* 206* 191*  --   --   --   BILITOT 2.3* 1.9* 1.5* 1.7* 2.1*  --   --   --   PROT 4.7* 4.3* 4.3* 4.6* 4.6*  --   --   --   ALBUMIN 1.6* 1.5* 1.5* 1.6* 1.6* 1.8* 1.8* 2.1*  No results for input(s): LIPASE, AMYLASE in the last 168 hours. Recent Labs  Lab 01/12/19 2115  AMMONIA 68*   Coagulation Profile: No results for input(s): INR, PROTIME in the last 168 hours. Cardiac Enzymes: No results for input(s): CKTOTAL, CKMB, CKMBINDEX, TROPONINI in the last 168 hours. BNP (last 3 results) No results for input(s): PROBNP in the last 8760 hours. HbA1C: No results for input(s): HGBA1C in the last 72 hours. CBG: Recent Labs  Lab 01/12/19 1155 01/13/19 0643 01/13/19 0753 01/13/19 1208 01/13/19 1638  GLUCAP 87 86 79 90 102*   Lipid Profile: No results for input(s): CHOL, HDL, LDLCALC, TRIG, CHOLHDL, LDLDIRECT in the last 72 hours. Thyroid Function Tests: No results for input(s): TSH, T4TOTAL, FREET4, T3FREE, THYROIDAB in the last 72 hours. Anemia Panel: No results for input(s): VITAMINB12, FOLATE, FERRITIN, TIBC, IRON, RETICCTPCT in the last 72 hours. Sepsis Labs: No results for input(s): PROCALCITON, LATICACIDVEN in the last 168 hours.  Recent Results (from the past 240 hour(s))  SARS Coronavirus 2 (CEPHEID - Performed in [facility] hospital lab), Hosp Order     Status: None   Collection Time:  01/06/19  9:23 PM   Specimen: Nasopharyngeal Swab  Result Value Ref Range Status   SARS Coronavirus 2 NEGATIVE NEGATIVE Final    Comment: (NOTE) If result is NEGATIVE SARS-CoV-2 target nucleic acids are NOT DETECTED. The SARS-CoV-2 RNA is generally detectable in upper and lower  respiratory specimens during the acute phase of infection. The lowest  concentration of SARS-CoV-2 viral copies this assay can detect is 250  copies / mL. A negative result does not preclude SARS-CoV-2 infection  and should not be used as the sole basis for treatment or other  patient management decisions.  A negative result may occur with  improper specimen collection / handling, submission of specimen other  than nasopharyngeal swab, presence of viral mutation(s) within the  areas targeted by this assay, and inadequate number of viral copies  (<250 copies / mL). A negative result must be combined with clinical  observations, patient history, and epidemiological information. If result is POSITIVE SARS-CoV-2 target nucleic acids are DETECTED. The SARS-CoV-2 RNA is generally detectable in upper and lower  respiratory specimens dur ing the acute phase of infection.  Positive  results are indicative of active infection with SARS-CoV-2.  Clinical  correlation with patient history and other diagnostic information is  necessary to determine patient infection status.  Positive results do  not rule out bacterial infection or co-infection with other viruses. If result is PRESUMPTIVE POSTIVE SARS-CoV-2 nucleic acids MAY BE PRESENT.   A presumptive positive result was obtained on the submitted specimen  and confirmed on repeat testing.  While 2019 novel coronavirus  (SARS-CoV-2) nucleic acids may be present in the submitted sample  additional confirmatory testing may be necessary for epidemiological  and / or clinical management purposes  to differentiate between  SARS-CoV-2 and other Sarbecovirus currently known to  infect humans.  If clinically indicated additional testing with an alternate test  methodology [phone]) is advised. The SARS-CoV-2 RNA is generally  detectable in upper and lower respiratory sp ecimens during the acute  phase of infection. The expected result is Negative. Fact Sheet for Patients:  StrictlyIdeas.no Fact Sheet for Healthcare Providers: BankingDealers.co.za This test is not yet approved or cleared by the [place] FDA and has been authorized for detection and/or diagnosis of SARS-CoV-2 by FDA under an Emergency Use Authorization (EUA).  This EUA will remain in effect (meaning  this test can be used) for the duration of the COVID-19 declaration under Section 564(b)(1) of the Act, 21 U.S.C. section 360bbb-3(b)(1), unless the authorization is terminated or revoked sooner. Performed at [facility], [place] [address], [place], [place] [address]   Culture, blood (routine x 2)     Status: None   Collection Time: 01/07/19  5:46 PM   Specimen: BLOOD LEFT HAND  Result Value Ref Range Status   Specimen Description   Final    BLOOD LEFT HAND Performed at [facility] [address], [place], [place] [address]    Special Requests   Final    BOTTLES DRAWN AEROBIC ONLY Blood Culture results may not be optimal due to an inadequate volume of blood received in culture bottles Performed at [facility] [address], [place], [place] [address]    Culture   Final    NO GROWTH 5 DAYS Performed at [facility], [place] [address] [place], [place] [address]    Report Status 01/12/2019 FINAL  Final  Culture, blood (routine x 2)     Status: None   Collection Time: 01/07/19  5:46 PM   Specimen: BLOOD  Result Value Ref Range Status   Specimen Description   Final    BLOOD LEFT ANTECUBITAL Performed at [facility] [address], [place], [place] [address]     Special Requests   Final    BOTTLES DRAWN AEROBIC ONLY Blood Culture adequate volume Performed at [facility] [address], [place], [place] [address]    Culture   Final    NO GROWTH 5 DAYS Performed at [facility], [place] [address], [place], [place] [address]    Report Status 01/12/2019 FINAL  Final  Culture, body fluid-bottle     Status: None (Preliminary result)   Collection Time: 01/12/19  4:17 PM   Specimen: Fluid  Result Value Ref Range Status   Specimen Description FLUID PERITONEAL  Final   Special Requests BOTTLES DRAWN AEROBIC AND ANAEROBIC  Final   Culture   Final    NO GROWTH < 12 HOURS Performed at [facility], [place] [address] [place], [place] [address]    Report Status PENDING  Incomplete  Gram stain     Status: None   Collection Time: 01/12/19  4:17 PM   Specimen: Fluid  Result Value Ref Range Status   Specimen Description FLUID PERITONEAL  Final   Special Requests NONE  Final   Gram Stain   Final    MODERATE WBC PRESENT, PREDOMINANTLY MONONUCLEAR NO ORGANISMS SEEN Performed at [facility], [place] [address], [place],  [address]    Report Status 01/12/2019 FINAL  Final         Radiology Studies: US Paracentesis  Result Date: 01/12/2019 INDICATION: Patient with history of cirrhosis by imaging, chronic kidney disease, ascites. Request made for diagnostic and therapeutic paracentesis. EXAM: ULTRASOUND GUIDED DIAGNOSTIC AND THERAPEUTIC PARACENTESIS MEDICATIONS: None COMPLICATIONS: None immediate. PROCEDURE: Informed written consent was obtained from the patient after a discussion of the risks, benefits and alternatives to treatment. A timeout was performed prior to the initiation of the procedure. Initial ultrasound scanning demonstrates a moderate to large amount of ascites within the right lower abdominal quadrant. The right lower abdomen was prepped and draped in the usual sterile fashion. 1% lidocaine was used for  local anesthesia. Following this, a 6 Fr Safe-T-Centesis catheter was introduced.  An ultrasound image was saved for documentation purposes. The paracentesis was performed. The catheter was removed and a dressing was applied. The patient tolerated the procedure well without immediate post procedural complication. FINDINGS: A total of approximately 4.5 liters of clear, yellow fluid was removed. Samples were sent to the laboratory as requested by the clinical team. IMPRESSION: Successful ultrasound-guided diagnostic and therapeutic paracentesis yielding 4.5 liters of peritoneal fluid. Read by: Rowe Robert, PA-C Electronically Signed   By: Jacqulynn Cadet M.D.   On: 01/12/2019 16:44        Scheduled Meds:  calcium carbonate  500 mg of elemental calcium Oral BID   And   cholecalciferol  200 Units Oral BID   colestipol  1 g Oral BID   escitalopram  10 mg Oral Daily   feeding supplement  1 Container Oral BID BM   feeding supplement (ENSURE ENLIVE)  237 mL Oral Q24H   feeding supplement (PRO-STAT SUGAR FREE 64)  30 mL Oral BID   levothyroxine  88 mcg Oral Q0600   lipase/protease/amylase  36,000 Units Oral TID AC   loperamide  2 mg Oral Daily   magnesium oxide  400 mg Oral BID   multivitamin with minerals  1 tablet Oral Daily   ondansetron (ZOFRAN) IV  4 mg Intravenous Q8H   potassium chloride  10 mEq Oral Daily   potassium phosphate (monobasic)  500 mg Oral TID WC & HS   sodium bicarbonate  650 mg Oral BID   Continuous Infusions:   LOS: 7 days    Time spent: 25 minutes spent in the coordination of care today.    Jonnie Finner, DO [facility] Pager [phone]  If 7PM-7AM, please contact night-coverage [email or web address] Password [facility] 01/14/2019, 7:48 AM

## 2019-01-14 NOTE — TOC Progression Note (Signed)
Transition of Care [facility]) - Progression Note    Patient Details  Name: Kaylee Rasmussen MRN: [id number] Date of Birth: Jan 10, 1951  [facility]) CM/SW Contact  Zaidy Absher, Juliann Pulse, RN Phone Number: 01/14/2019, 3:16 PM  Clinical Narrative: TC HTA rep Crystal-in network [place] agencies-[facility]-have said unable to accept,interim-await response from rep [facility], [facility]-await response fro [facility] await response from [facility].      Expected Discharge Plan: [place] Barriers to Discharge: Continued Medical Work up  Expected Discharge Plan and Services Expected Discharge Plan: [place]   Discharge Planning Services: CM Consult Post Acute Care Choice: [place] arrangements for the past 2 months: Single Family Home Expected Discharge Date: 01/08/19                                     Social Determinants of Health (SDOH) Interventions    Readmission Risk Interventions No flowsheet data found.

## 2019-01-14 NOTE — Progress Notes (Signed)
[name] Progress Note  CC:  Diarrhea  Subjective:  No further diarrhea on current regimen.  Only one BM in 24 hours per patient.  Plan is to go to rehab if possible.  Objective:  Vital signs in last 24 hours: Temp:  [98.3 F (36.8 C)-98.8 F (37.1 C)] 98.5 F (36.9 C) (07/24 0430) Pulse Rate:  [81-89] 81 (07/24 0430) Resp:  [16-22] 22 (07/24 0430) BP: (105-119)/(53-66) 115/53 (07/24 0430) SpO2:  [95 %] 95 % (07/24 0430) Weight:  [71.5 kg] 71.5 kg (07/24 0430) Last BM Date: 01/14/19 General:  Alert, Well-developed, in NAD Heart:  Regular rate and rhythm; no murmurs Pulm:  CTAB.  No increased WOB. Abdomen:  Soft, somewhat distended from ascites fluid.  Non-tender.  BS present and slightly hyperactive. Extremities:  Without edema. Neurologic:  Alert and oriented x 4;  grossly normal neurologically. Psych:  Alert and cooperative. Normal mood and affect.  Intake/Output from previous day: 07/23 0701 - 07/24 0700 In: 170 [P.O.:100; I.V.:20; IV Piggyback:50] Out: -  Intake/Output this shift: Total I/O In: -  Out: 100 [Urine:100]  Lab Results: Recent Labs    01/12/19 0522 01/13/19 0705 01/14/19 0533  WBC 10.2 11.0* 11.0*  HGB 9.5* 9.3* 9.5*  HCT 29.7* 29.8* 30.1*  PLT 155 135* 133*   BMET Recent Labs    01/12/19 0522 01/13/19 0705 01/14/19 0533  NA 139 138 139  K 4.0 4.3 4.6  CL 108 107 107  CO2 21* 22 21*  GLUCOSE 68* 87 87  BUN 30* 31* 34*  CREATININE 2.14* 2.22* 2.19*  CALCIUM 7.7* 7.8* 8.0*   LFT Recent Labs    01/12/19 0522  01/14/19 0533  PROT 4.6*  --   --   ALBUMIN 1.6*   < > 2.1*  AST 55*  --   --   ALT 16  --   --   ALKPHOS 191*  --   --   BILITOT 2.1*  --   --    < > = values in this interval not displayed.   US Paracentesis  Result Date: 01/12/2019 INDICATION: Patient with history of cirrhosis by imaging, chronic kidney disease, ascites. Request made for diagnostic and therapeutic paracentesis. EXAM: ULTRASOUND  GUIDED DIAGNOSTIC AND THERAPEUTIC PARACENTESIS MEDICATIONS: None COMPLICATIONS: None immediate. PROCEDURE: Informed written consent was obtained from the patient after a discussion of the risks, benefits and alternatives to treatment. A timeout was performed prior to the initiation of the procedure. Initial ultrasound scanning demonstrates a moderate to large amount of ascites within the right lower abdominal quadrant. The right lower abdomen was prepped and draped in the usual sterile fashion. 1% lidocaine was used for local anesthesia. Following this, a 6 Fr Safe-T-Centesis catheter was introduced. An ultrasound image was saved for documentation purposes. The paracentesis was performed. The catheter was removed and a dressing was applied. The patient tolerated the procedure well without immediate post procedural complication. FINDINGS: A total of approximately 4.5 liters of clear, yellow fluid was removed. Samples were sent to the laboratory as requested by the clinical team. IMPRESSION: Successful ultrasound-guided diagnostic and therapeutic paracentesis yielding 4.5 liters of peritoneal fluid. Read by: Rowe Robert, PA-C Electronically Signed   By: Jacqulynn Cadet M.D.   On: 01/12/2019 16:44   Assessment / Plan: 1. Diarrhea with blood / weight loss. Possible pancreatic exocrine insufficiency and chronic colitis (nonspecific) on colon biopsies. Diarrhea resolved with colestid BID, Imodium once daily, and Creon.  One BM in 24  hours per patient.  ContinueColestid, imodium, and Creon for now.  Would hold Imodium for constipation. -Can further evaluate colon biopsy findings as outpatient. Consider IBD markers. Of interest, patient was on Humira (RA) for years until last August.   2. ? Cirrhosis by CT scan, ? secondary to long term use of methotrexate vs fatty liver vs other. No hx of right sided heart failure. No evidence for acute decompensation, outpatient workup appropriate. Autoimmune / genetic  markers / viral markers all negative. Ferritin ~ 500 but probably from systemic inflammation not iron overload.  -outpatient follow up appropriate. May need eventual liver biopsy. -Hospitalist ordered LVP 7/22, 4.5 L removed. No SBP. Cannot yet calculate SAAG (ascitic fluid albumin ordered separately at a later time and does not look like it has been processed.  I tried to contact the lab about this 3 times this morning and keep getting put on hold for long periods of time).  Cytology pending.  -Would hold off on further LVPs for now given renal dysfunction. Not able to give diuretics yet either.  -Low sodium diet.    3. CAP, completed Zithromycin and Rocephin  4. RA / CKD3 / chronic anemia    **Ok for discharge from GI standpoint.  Has F/U GI appt with Dr. Lyndel Safe in August (about 4 weeks away).    LOS: 7 days   Kaylee Rasmussen. Kaylee Rasmussen  01/14/2019, 9:27 AM

## 2019-01-14 NOTE — NC FL2 (Signed)
[place] MEDICAID FL2 LEVEL OF CARE SCREENING TOOL     IDENTIFICATION  Patient Name: Kaylee Rasmussen Birthdate: December 07, 1950 Sex: female Admission Date (Current Location): 01/06/2019  [facility] and [place] Number:  Herbalist and Address:  [facility],  [place], [place]      Provider Number: [id number]  Attending Physician Name and Address:  Jonnie Finner, DO  Relative Name and Phone Number:  Gresia Isidoro spouse [phone]    Current Level of Care: Hospital Recommended Level of Care: [place] Prior Approval Number:    Date Approved/Denied:   PASRR Number: [id number]  Discharge Plan: SNF    Current Diagnoses: Patient Active Problem List   Diagnosis Date Noted  . Ascites   . LFTs abnormal   . Hepatic cirrhosis ([place])   . Pancreatic insufficiency   . Rectal bleeding   . Severe protein-calorie malnutrition ([place]) 01/08/2019  . Abnormal liver function 01/07/2019  . Diarrhea 01/07/2019  . Pulmonary nodule 01/07/2019  . Metabolic acidosis 59/16/3846  . ARF (acute renal failure) ([place]) 01/06/2019  . Rheumatoid arthritis involving multiple sites with positive rheumatoid factor ([place]) 08/27/2017  . High risk medication use 08/27/2017  . Primary osteoarthritis of both knees 08/27/2017  . History of total hip replacement, right 08/27/2017  . Renal calcinosis 08/27/2017  . History of hypertension 08/27/2017  . History of hypothyroidism 08/27/2017  . History of hypercholesterolemia 08/27/2017    Orientation RESPIRATION BLADDER Height & Weight     Self, Time, Situation, Place    Continent Weight: 71.5 kg Height:  5\' 1"  (154.9 cm)  BEHAVIORAL SYMPTOMS/MOOD NEUROLOGICAL BOWEL NUTRITION STATUS      Continent Diet(2 gm na)  AMBULATORY STATUS COMMUNICATION OF NEEDS Skin   Limited Assist Verbally Normal                       Personal Care Assistance Level of Assistance  Bathing, Feeding, Dressing Bathing  Assistance: Limited assistance Feeding assistance: Limited assistance Dressing Assistance: Limited assistance     Functional Limitations Info  Sight(eyeglasses) Sight Info: Impaired(eyeglasses)        SPECIAL CARE FACTORS FREQUENCY                       Contractures Contractures Info: Not present    Additional Factors Info  Code Status, Allergies Code Status Info: Full code Allergies Info: Cozaar Losartan Potassium, Scallops Shellfish Allergy           Current Medications (01/14/2019):  This is the current hospital active medication list Current Facility-Administered Medications  Medication Dose Route Frequency Provider Last Rate Last Dose  . acetaminophen (TYLENOL) tablet 500 mg  500 mg Oral Q6H PRN Gatha Mayer, MD   500 mg at 01/07/19 1819  . albuterol (PROVENTIL) (2.5 MG/3ML) 0.083% nebulizer solution 2.5 mg  2.5 mg Nebulization Q4H PRN Gatha Mayer, MD      . calcium carbonate (TUMS - dosed in mg elemental calcium) chewable tablet 500 mg of elemental calcium  500 mg of elemental calcium Oral BID Gatha Mayer, MD   500 mg of elemental calcium at 01/13/19 2132   And  . cholecalciferol (VITAMIN D3) tablet 200 Units  200 Units Oral BID Gatha Mayer, MD   200 Units at 01/14/19 1055  . colestipol (COLESTID) tablet 1 g  1 g Oral BID Willia Craze, NP   1 g at 01/13/19  2131  . diclofenac sodium (VOLTAREN) 1 % transdermal gel 4 g  4 g Topical TID PRN Gatha Mayer, MD      . escitalopram (LEXAPRO) tablet 10 mg  10 mg Oral Daily Gatha Mayer, MD   10 mg at 01/14/19 1053  . feeding supplement (BOOST / RESOURCE BREEZE) liquid 1 Container  1 Container Oral BID BM Elodia Florence., MD   1 Container at 01/11/19 1017  . feeding supplement (ENSURE ENLIVE) (ENSURE ENLIVE) liquid 237 mL  237 mL Oral Q24H Elodia Florence., MD   237 mL at 01/13/19 1356  . feeding supplement (PRO-STAT SUGAR FREE 64) liquid 30 mL  30 mL Oral BID Gatha Mayer, MD   30 mL  at 01/13/19 1030  . levothyroxine (SYNTHROID) tablet 88 mcg  88 mcg Oral Q0600 Gatha Mayer, MD   88 mcg at 01/13/19 0518  . lipase/protease/amylase (CREON) capsule 36,000 Units  36,000 Units Oral TID AC Willia Craze, NP   36,000 Units at 01/14/19 1227  . loperamide (IMODIUM) capsule 2 mg  2 mg Oral Daily Willia Craze, NP   2 mg at 01/14/19 1053  . magnesium oxide (MAG-OX) tablet 400 mg  400 mg Oral BID Marylyn Ishihara, Tyrone A, DO   400 mg at 01/14/19 1053  . morphine 2 MG/ML injection 1 mg  1 mg Intravenous Q4H PRN Gatha Mayer, MD      . multivitamin with minerals tablet 1 tablet  1 tablet Oral Daily Elodia Florence., MD   1 tablet at 01/14/19 1054  . nystatin (MYCOSTATIN) 100000 UNIT/ML suspension 500,000 Units  5 mL Oral QID Marylyn Ishihara, Tyrone A, DO   500,000 Units at 01/14/19 1226  . ondansetron (ZOFRAN) injection 4 mg  4 mg Intravenous Q8H Willia Craze, NP   4 mg at 01/13/19 2127  . ondansetron (ZOFRAN) injection 4 mg  4 mg Intravenous Q6H PRN Willia Craze, NP      . oxyCODONE (Oxy IR/ROXICODONE) immediate release tablet 5 mg  5 mg Oral Q4H PRN Gatha Mayer, MD   5 mg at 01/09/19 1013  . potassium chloride (K-DUR) CR tablet 10 mEq  10 mEq Oral Daily Gatha Mayer, MD   10 mEq at 01/13/19 1014  . potassium phosphate (monobasic) (K-PHOS ORIGINAL) tablet 500 mg  500 mg Oral TID WC & HS Kyle, Tyrone A, DO   500 mg at 01/14/19 1056  . sodium bicarbonate tablet 650 mg  650 mg Oral BID Gatha Mayer, MD   650 mg at 01/14/19 1054  . sodium chloride flush (NS) 0.9 % injection 10-40 mL  10-40 mL Intracatheter PRN Elodia Florence., MD   10 mL at 01/14/19 0645     Discharge Medications: Please see discharge summary for a list of discharge medications.  Relevant Imaging Results:  Relevant Lab Results:   Additional Information ss#[phone] [place], [place]

## 2019-01-14 NOTE — Progress Notes (Signed)
CMV is negative.

## 2019-01-14 NOTE — Progress Notes (Signed)
Pt informed this RN that she does not want to go to rehab and wishes to go home with husband. This RN attempted to educate patient on the benefits of rehab but the patient still refused. MD Marylyn Ishihara notified.

## 2019-01-14 NOTE — TOC Progression Note (Signed)
Transition of Care [facility]) - Progression Note    Patient Details  Name: Kaylee Rasmussen MRN: [id number] Date of Birth: 24-Feb-1951  [facility]) CM/SW Contact  Delmon Andrada, Juliann Pulse, RN Phone Number: 01/14/2019, 5:29 PM  Clinical Narrative:Received call from HTA rep-they have auth for SNF-auth#[id number] for 7 days. Call HTA [phone])with selected SNF [facility] has accepted rep Dorian Pod following if d/c plan is home w/HHC. Noted per attending patient wants home w/HHC.       Expected Discharge Plan: [place] Barriers to Discharge: Continued Medical Work up  Expected Discharge Plan and Services Expected Discharge Plan: [place]   Discharge Planning Services: CM Consult Post Acute Care Choice: [place] arrangements for the past 2 months: Single Family Home Expected Discharge Date: 01/08/19                                     Social Determinants of Health (SDOH) Interventions    Readmission Risk Interventions No flowsheet data found.

## 2019-01-15 DIAGNOSIS — K529 Noninfective gastroenteritis and colitis, unspecified: Secondary | ICD-10-CM

## 2019-01-15 LAB — GLUCOSE, CAPILLARY
Glucose-Capillary: 74 mg/dL (ref 70–99)
Glucose-Capillary: 74 mg/dL (ref 70–99)
Glucose-Capillary: 67 mg/dL — ABNORMAL LOW (ref 70–99)
Glucose-Capillary: 86 mg/dL (ref 70–99)

## 2019-01-15 LAB — RENAL FUNCTION PANEL
Albumin: 2.1 g/dL — ABNORMAL LOW (ref 3.5–5.0)
Anion gap: 11 (ref 5–15)
BUN: 36 mg/dL — ABNORMAL HIGH (ref 8–23)
CO2: 22 mmol/L (ref 22–32)
Calcium: 7.9 mg/dL — ABNORMAL LOW (ref 8.9–10.3)
Chloride: 109 mmol/L (ref 98–111)
Creatinine, Ser: 2.16 mg/dL — ABNORMAL HIGH (ref 0.44–1.00)
GFR calc Af Amer: 27 mL/min — ABNORMAL LOW (ref >60)
GFR calc non Af Amer: 23 mL/min — ABNORMAL LOW (ref >60)
Glucose, Bld: 80 mg/dL (ref 70–99)
Phosphorus: 2.3 mg/dL — ABNORMAL LOW (ref 2.5–4.6)
Potassium: 4.6 mmol/L (ref 3.5–5.1)
Sodium: 142 mmol/L (ref 135–145)

## 2019-01-15 LAB — CBC
HCT: 31.4 % — ABNORMAL LOW (ref 36.0–46.0)
Hemoglobin: 9.6 g/dL — ABNORMAL LOW (ref 12.0–15.0)
MCH: 30.7 pg (ref 26.0–34.0)
MCHC: 30.6 g/dL (ref 30.0–36.0)
MCV: 100.3 fL — ABNORMAL HIGH (ref 80.0–100.0)
Platelets: 142 10*3/uL — ABNORMAL LOW (ref 150–400)
RBC: 3.13 MIL/uL — ABNORMAL LOW (ref 3.87–5.11)
RDW: 18.6 % — ABNORMAL HIGH (ref 11.5–15.5)
WBC: 8.3 10*3/uL (ref 4.0–10.5)
nRBC: 0 % (ref 0.0–0.2)

## 2019-01-15 LAB — NOVEL CORONAVIRUS, NAA (HOSP ORDER, SEND-OUT TO REF LAB; TAT 18-24 HRS): SARS-CoV-2, NAA: NOT DETECTED

## 2019-01-15 MED ORDER — LACTULOSE 10 GM/15ML PO SOLN
10.0000 g | Freq: Three times a day (TID) | ORAL | Status: DC
  Administered 2019-01-15 – 2019-01-20 (×10): 10 g via ORAL
  Filled 2019-01-15 (×17): qty 15

## 2019-01-15 MED ORDER — DEXTROSE 50 % IV SOLN
1.0000 | Freq: Once | INTRAVENOUS | Status: AC
  Administered 2019-01-15: 50 mL via INTRAVENOUS
  Filled 2019-01-15: qty 50

## 2019-01-15 NOTE — Progress Notes (Signed)
Per report, pt will only take a few pills whole in applesauce. This nurse attempted to crush and administer in applesauce, pt could only tolerate one of the two bites of sauce. Unknown amount of medication received. Pt took 40mEq potassium ER whole.

## 2019-01-15 NOTE — Progress Notes (Signed)
Pt refusing 1200, 1400, 1600 medications. MD Marylyn Ishihara aware. Will attempt to administer again later. Will continue to monitor.

## 2019-01-15 NOTE — Progress Notes (Signed)
PT Cancellation Note  Patient Details Name: Kaylee Rasmussen MRN: [id number] DOB: 05/31/1951   Cancelled Treatment:    Reason Eval/Treat Not Completed: Fatigue/lethargy limiting ability to participate(pt sleeping upon my arrival. She stated she's too tired to attempt any mobilityand wants to sleep right now. She stated her husband can assist her as needed at home. Will follow.)   Philomena Doheny PT 01/15/2019  Acute Rehabilitation Services Pager [phone] Office [phone]

## 2019-01-15 NOTE — Progress Notes (Signed)
Pt CBG of 68 and pulling at tape on PIV. NT Mae gave pt orange juice and encouraged pt to drink. Pt has had no PO intake today, other than 2 bits of applesauce with medications. Will continue to monitor.

## 2019-01-15 NOTE — Progress Notes (Signed)
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  She was admitted for blood in stool and chronic diarrhea and abnormal liver function. She's now s/p colonoscopy with GI, awaiting bx results. CMV pending. With worsening edema 7/21, given dose of lasix x1.    Assessment & Plan:   Principal Problem:   ARF (acute renal failure) (HCC) Active Problems:   Rheumatoid arthritis involving multiple sites with positive rheumatoid factor (HCC)   History of hypertension   History of hypothyroidism   Abnormal liver function   Diarrhea   Pulmonary nodule   Metabolic acidosis   Severe protein-calorie malnutrition (HCC)   Rectal bleeding   LFTs abnormal   Hepatic cirrhosis (HCC)   Pancreatic insufficiency   Ascites   Blood in stool - GI consult, appreciate recommendations - Colonoscopy with localized inflammation at ileocecal valve, external and internal hemorrhoids - see report - bx taken (results pending) - ? Recurrent/persistent CMV  - CMV negative  Abnormal liver function  - Check GGT (elevated) - Check ferritin (elevated), iron (wnl), tibc (not calc), ceruloplasmin (wnl), alpha 1 antitripsin (wnl), ana (negative), antismooth muscle ab (wnl), check anti mitochondrial antibody (wnl) - Check MRCP ->with severe diffuse hepatic steatosis (can't exclude cirrhosis) - solitary subcentimeter R liver lesion, likely benign cyst ->recommend f/u MRI with and without IV contrast in 3-6  months - ? 2/2 methotrexate [place] vs fatty liver vs other per GI - Will need further w/u outpatient   Chronic diarrhea - Check celiac panel (gliadin antibodies - wnl, TTG - normal, and reticulin antibody pending) - Cholestyramine prn - Trial of creon  - Appreciate GI recs     - resolved with colestid BID, imodium, and creon  Community Acquired Pneumonia  Fever:  - Bld Cx Neg - CXR with patchy airspace dz in L lung base (early pneumonia vs sequelae of aspiration) - Repeat 2 view CXR with small bilateral effusions and associated atelectasis vs consolidation - Ceftriaxone/azithro x 5 days  AKI on CKD III - creatinine worsened in setting of soft BP's on 7/18.  - baseline creatinine appears to be ~1.5, continue to monitor. - No hydro on imaging from earlier in hospitalization     - renal function stable at about 2.1.   Right Greater than Left Effusions  Moderate Volume Ascites:  -s/p paracentesis w/ 4.5L removed - reports feeling much better today and wants to eat - monitor renal function (essentially unmoved from; s/p 2 dose albumin)  Hypotension/Hx of Hypertension - BP low normal, continue holding antihypertensives for now  Non- AG acidosis - Improved, d/c IV bicarb - Pt on PO bicarb; acidosis resolved, monitor  N/V - Zofran 4mg  iv q6h prn - improved after paracentesis  Hypothyroidism - Cont Levothyroxine 88 micrograms po qday  Anxiety - Cont Lexapro 10mg  po qday  Normocytic Anemia - stable, Hgb 9.5, no frank bleed noted  RA - Off Humira, follows with Deveshwar - Voltaren gel prn  Pulmonary Nodule - outpatient follow up  Right greater than Left LEE - RLE without DVT.  Hypoglycemia - poor PO intake; encourage PO intake -  says she feels bloated and stomach is tight; will ask IR to eval for paracentesis - now s/p  paracentesis; says that she feels hungry and wants to eat; encourage diet     - continue encouraging diet     - hypoglycemic again; amp D50  Hypomagnesemia/Hypophospatemia - replete, monitor  Debility - will ask PT to eval - PT recs SNF; spoke with patient, and she has agreed to SNF rehab  More confused in lethargic today. Last ammonia check was elevated. Most likely cause. Trying to get labs, but difficult. Give lactulose. Monitor. Not safe for discharge yet.   DVT prophylaxis:SCDs Code Status:FULL Disposition Plan:TBD   Consultants:  GI  IR  Subjective: "Ok! I'll take it."  Objective: Vitals:   01/14/19 2124 01/15/19 0514 01/15/19 0700 01/15/19 1436  BP: 123/80 125/60  120/67  Pulse: 92 86  87  Resp: 20 (!) 22  20  Temp: 98.4 F (36.9 C) 98.2 F (36.8 C)  98.2 F (36.8 C)  TempSrc: Oral Oral    SpO2: 92% 91%  96%  Weight:   73 kg   Height:        Intake/Output Summary (Last 24 hours) at 01/15/2019 1644 Last data filed at 01/15/2019 1634 Gross per 24 hour  Intake 178 ml  Output 125 ml  Net 53 ml   Filed Weights   01/13/19 0513 01/14/19 0430 01/15/19 0700  Weight: 71.3 kg 71.5 kg 73 kg    Examination:  General:68 y.o.femaleresting in bed in NAD Cardiovascular: RRR, +S1, S2, no m/g/r, equal pulses throughout Respiratory: CTABL, no w/r/r, normal WOB GI: BS+, NT,distended,soft,no masses noted, no organomegaly noted Skin: No rashes, bruises, ulcerations noted Neuro: alert to name, following commands, however slow, lethargic    Data Reviewed: I have personally reviewed following labs and imaging studies.  CBC: Recent Labs  Lab 01/11/19 0513 01/12/19 0522 01/13/19 0705 01/14/19 0533 01/15/19 1305  WBC 9.3 10.2 11.0* 11.0* 8.3  NEUTROABS  --   --  8.2* 8.1*  --   HGB 9.2* 9.5* 9.3* 9.5* 9.6*  HCT 28.5* 29.7* 29.8* 30.1* 31.4*  MCV 95.3 94.6 99.0 97.4 100.3*  PLT 151 155 135* 133* 892*   Basic Metabolic Panel: Recent  Labs  Lab 01/10/19 0534 01/11/19 0513 01/12/19 0522 01/13/19 0705 01/14/19 0533 01/15/19 1305  NA 141 140 139 138 139 142  K 3.6 3.4* 4.0 4.3 4.6 4.6  CL 108 107 108 107 107 109  CO2 20* 20* 21* 22 21* 22  GLUCOSE 67* 82 68* 87 87 80  BUN 25* 27* 30* 31* 34* 36*  CREATININE 1.99* 2.06* 2.14* 2.22* 2.19* 2.16*  CALCIUM 7.3* 7.6* 7.7* 7.8* 8.0* 7.9*  MG 1.7 1.6* 1.6* 1.9 2.0  --   PHOS  --   --   --  2.6 2.0* 2.3*   GFR: Estimated Creatinine Clearance: 23.1 mL/min (A) (by C-G formula based on SCr of 2.16 mg/dL (H)). Liver Function Tests: Recent Labs  Lab 01/09/19 0559 01/10/19 0534 01/11/19 0513 01/12/19 0522 01/12/19 2115 01/13/19 0705 01/14/19 0533 01/15/19 1305  AST 57* 53* 57* 55*  --   --   --   --   ALT 12 13 15 16   --   --   --   --   ALKPHOS 160* 175* 206* 191*  --   --   --   --   BILITOT 1.9* 1.5* 1.7* 2.1*  --   --   --   --  PROT 4.3* 4.3* 4.6* 4.6*  --   --   --   --   ALBUMIN 1.5* 1.5* 1.6* 1.6* 1.8* 1.8* 2.1* 2.1*   No results for input(s): LIPASE, AMYLASE in the last 168 hours. Recent Labs  Lab 01/12/19 2115  AMMONIA 68*   Coagulation Profile: No results for input(s): INR, PROTIME in the last 168 hours. Cardiac Enzymes: No results for input(s): CKTOTAL, CKMB, CKMBINDEX, TROPONINI in the last 168 hours. BNP (last 3 results) No results for input(s): PROBNP in the last 8760 hours. HbA1C: No results for input(s): HGBA1C in the last 72 hours. CBG: Recent Labs  Lab 01/14/19 1222 01/14/19 1627 01/15/19 0725 01/15/19 1143 01/15/19 1625  GLUCAP 85 81 74 74 67*   Lipid Profile: No results for input(s): CHOL, HDL, LDLCALC, TRIG, CHOLHDL, LDLDIRECT in the last 72 hours. Thyroid Function Tests: No results for input(s): TSH, T4TOTAL, FREET4, T3FREE, THYROIDAB in the last 72 hours. Anemia Panel: No results for input(s): VITAMINB12, FOLATE, FERRITIN, TIBC, IRON, RETICCTPCT in the last 72 hours. Sepsis Labs: No results for input(s): PROCALCITON,  LATICACIDVEN in the last 168 hours.  Recent Results (from the past 240 hour(s))  SARS Coronavirus 2 (CEPHEID - Performed in [place] hospital lab), Hosp Order     Status: None   Collection Time: 01/06/19  9:23 PM   Specimen: Nasopharyngeal Swab  Result Value Ref Range Status   SARS Coronavirus 2 NEGATIVE NEGATIVE Final    Comment: (NOTE) If result is NEGATIVE SARS-CoV-2 target nucleic acids are NOT DETECTED. The SARS-CoV-2 RNA is generally detectable in upper and lower  respiratory specimens during the acute phase of infection. The lowest  concentration of SARS-CoV-2 viral copies this assay can detect is 250  copies / mL. A negative result does not preclude SARS-CoV-2 infection  and should not be used as the sole basis for treatment or other  patient management decisions.  A negative result may occur with  improper specimen collection / handling, submission of specimen other  than nasopharyngeal swab, presence of viral mutation(s) within the  areas targeted by this assay, and inadequate number of viral copies  (<250 copies / mL). A negative result must be combined with clinical  observations, patient history, and epidemiological information. If result is POSITIVE SARS-CoV-2 target nucleic acids are DETECTED. The SARS-CoV-2 RNA is generally detectable in upper and lower  respiratory specimens dur ing the acute phase of infection.  Positive  results are indicative of active infection with SARS-CoV-2.  Clinical  correlation with patient history and other diagnostic information is  necessary to determine patient infection status.  Positive results do  not rule out bacterial infection or co-infection with other viruses. If result is PRESUMPTIVE POSTIVE SARS-CoV-2 nucleic acids MAY BE PRESENT.   A presumptive positive result was obtained on the submitted specimen  and confirmed on repeat testing.  While 2019 novel coronavirus  (SARS-CoV-2) nucleic acids may be present in the submitted  sample  additional confirmatory testing may be necessary for epidemiological  and / or clinical management purposes  to differentiate between  SARS-CoV-2 and other Sarbecovirus currently known to infect humans.  If clinically indicated additional testing with an alternate test  methodology [phone]) is advised. The SARS-CoV-2 RNA is generally  detectable in upper and lower respiratory sp ecimens during the acute  phase of infection. The expected result is Negative. Fact Sheet for Patients:  StrictlyIdeas.no Fact Sheet for Healthcare Providers: BankingDealers.co.za This test is not yet approved or cleared by the  [place] Architectural technologist and has been authorized for detection and/or diagnosis of SARS-CoV-2 by FDA under an Print production planner (EUA).  This EUA will remain in effect (meaning this test can be used) for the duration of the COVID-19 declaration under Section 564(b)(1) of the Act, 21 U.S.C. section 360bbb-3(b)(1), unless the authorization is terminated or revoked sooner. Performed at [facility], [place] [address] [place], [place] [address]   Culture, blood (routine x 2)     Status: None   Collection Time: 01/07/19  5:46 PM   Specimen: BLOOD LEFT HAND  Result Value Ref Range Status   Specimen Description   Final    BLOOD LEFT HAND Performed at [facility] [address], [place], [place] [address]    Special Requests   Final    BOTTLES DRAWN AEROBIC ONLY Blood Culture results may not be optimal due to an inadequate volume of blood received in culture bottles Performed at [facility] [place] [facility] [address], [place], [place] [address]    Culture   Final    NO GROWTH 5 DAYS Performed at [facility], [place] [address] [place], [place] [address]    Report Status 01/12/2019 FINAL  Final  Culture, blood (routine x 2)     Status: None   Collection Time: 01/07/19  5:46 PM    Specimen: BLOOD  Result Value Ref Range Status   Specimen Description   Final    BLOOD LEFT ANTECUBITAL Performed at [facility] [address] [place], [place] [address]    Special Requests   Final    BOTTLES DRAWN AEROBIC ONLY Blood Culture adequate volume Performed at [facility] [address] [place], [place] [address]    Culture   Final    NO GROWTH 5 DAYS Performed at [facility], [place] [address] [place], [place] [address]    Report Status 01/12/2019 FINAL  Final  Culture, body fluid-bottle     Status: None (Preliminary result)   Collection Time: 01/12/19  4:17 PM   Specimen: Fluid  Result Value Ref Range Status   Specimen Description FLUID PERITONEAL  Final   Special Requests BOTTLES DRAWN AEROBIC AND ANAEROBIC  Final   Culture   Final    NO GROWTH 3 DAYS Performed at [facility], [place] [address], [place], [place] [address]    Report Status PENDING  Incomplete  Gram stain     Status: None   Collection Time: 01/12/19  4:17 PM   Specimen: Fluid  Result Value Ref Range Status   Specimen Description FLUID PERITONEAL  Final   Special Requests NONE  Final   Gram Stain   Final    MODERATE WBC PRESENT, PREDOMINANTLY MONONUCLEAR NO ORGANISMS SEEN Performed at [facility], [place] [address], [place], [place] [address]    Report Status 01/12/2019 FINAL  Final  Novel Coronavirus, NAA (hospital order; send-out to ref lab)     Status: None   Collection Time: 01/14/19  2:00 PM   Specimen: Nasopharyngeal Swab; Respiratory  Result Value Ref Range Status   SARS-CoV-2, NAA NOT DETECTED NOT DETECTED Final    Comment: (NOTE) This test was developed and its performance characteristics determined by [employer]. This test has not been FDA cleared or approved. This test has been authorized by FDA under an Emergency Use Authorization (EUA). This test is only authorized for the duration of time the declaration that  circumstances exist justifying the authorization of the emergency use of in vitro diagnostic tests for detection of SARS-CoV-2 virus and/or diagnosis of COVID-19 infection under section 564(b)(1) of the Act, 21 U.S.C. 159YVO-5(F)(2), unless the authorization is terminated or revoked sooner. When diagnostic testing is negative, the possibility of a false negative result should be considered in the context of a patient's recent exposures and the presence of clinical signs and symptoms consistent with COVID-19. An individual without symptoms of COVID-19 and who is not shedding SARS-CoV-2 virus would expect to have a negative (not detected) result in this assay. Performed  At: [facility] [place], [place] [id number] Rush Farmer MD OT:[id number]    [place]  Final    Comment: Performed at [facility] [address] [place], [place] [address]         Radiology Studies: No results found.      Scheduled Meds: . calcium carbonate  500 mg of elemental calcium Oral BID   And  . cholecalciferol  200 Units Oral BID  . colestipol  1 g Oral BID  . escitalopram  10 mg Oral Daily  . feeding supplement  1 Container Oral BID BM  . feeding supplement (ENSURE ENLIVE)  237 mL Oral Q24H  . feeding supplement (PRO-STAT SUGAR FREE 64)  30 mL Oral BID  . lactulose  10 g Oral TID  . levothyroxine  88 mcg Oral Q0600  . lipase/protease/amylase  36,000 Units Oral TID AC  . magnesium oxide  400 mg Oral BID  . multivitamin with minerals  1 tablet Oral Daily  . nystatin  5 mL Oral QID  . ondansetron (ZOFRAN) IV  4 mg Intravenous Q8H  . potassium chloride  10 mEq Oral Daily  . potassium phosphate (monobasic)  500 mg Oral TID WC & HS  . sodium bicarbonate  650 mg Oral BID   Continuous Infusions:   LOS: 8 days    Time spent: 35 minutes spent in the coordination of care today.    Jonnie Finner, DO [facility]  Pager [phone]  If 7PM-7AM, please contact night-coverage [email or web address] Password [facility] 01/15/2019, 4:44 PM

## 2019-01-15 NOTE — Progress Notes (Signed)
The patient's spouse telephoned the RN to get an update on the patient's condition. The RN was in the patient's room and told the spouse that there was nothing new to report.  The spouse then telephoned the patient on the patient's phone.

## 2019-01-16 LAB — RENAL FUNCTION PANEL
Albumin: 2 g/dL — ABNORMAL LOW (ref 3.5–5.0)
Anion gap: 10 (ref 5–15)
BUN: 36 mg/dL — ABNORMAL HIGH (ref 8–23)
CO2: 21 mmol/L — ABNORMAL LOW (ref 22–32)
Calcium: 8 mg/dL — ABNORMAL LOW (ref 8.9–10.3)
Chloride: 112 mmol/L — ABNORMAL HIGH (ref 98–111)
Creatinine, Ser: 2.12 mg/dL — ABNORMAL HIGH (ref 0.44–1.00)
GFR calc Af Amer: 27 mL/min — ABNORMAL LOW (ref >60)
GFR calc non Af Amer: 23 mL/min — ABNORMAL LOW (ref >60)
Glucose, Bld: 94 mg/dL (ref 70–99)
Phosphorus: 2.5 mg/dL (ref 2.5–4.6)
Potassium: 5.3 mmol/L — ABNORMAL HIGH (ref 3.5–5.1)
Sodium: 143 mmol/L (ref 135–145)

## 2019-01-16 LAB — GLUCOSE, CAPILLARY
Glucose-Capillary: 73 mg/dL (ref 70–99)
Glucose-Capillary: 110 mg/dL — ABNORMAL HIGH (ref 70–99)
Glucose-Capillary: 90 mg/dL (ref 70–99)

## 2019-01-16 LAB — AMMONIA: Ammonia: 103 umol/L — ABNORMAL HIGH (ref 9–35)

## 2019-01-16 MED ORDER — ONDANSETRON 4 MG PO TBDP
4.0000 mg | ORAL_TABLET | Freq: Three times a day (TID) | ORAL | Status: DC | PRN
  Administered 2019-01-16 – 2019-01-20 (×2): 4 mg via ORAL
  Filled 2019-01-16 (×3): qty 1

## 2019-01-16 NOTE — Progress Notes (Signed)
Nutrition Follow-up  INTERVENTION:   -Continue Prostat liquid protein PO 30 ml BID with meals, each supplement provides 100 kcal, 15 grams protein. -Continue Boost Breeze po BID, each supplement provides 250 kcal and 9 grams of protein -Continue Ensure Enlive po daily, each supplement provides 350 kcal and 20 grams of protein -Continue Multivitamin with minerals daily  NUTRITION DIAGNOSIS:   Inadequate oral intake related to acute illness, decreased appetite as evidenced by per patient/family report, meal completion < 50%.  Ongoing.  GOAL:   Patient will meet greater than or equal to 90% of their needs  Not meeting.  MONITOR:   PO intake, Supplement acceptance, Diet advancement, Labs, Weight trends, I & O's  ASSESSMENT:   68 y.o. female with medical history of HTN, hyperlipidemia, asthma, rheumatoid arthritis, CMV colitis, HSV esophagitis in 07/2018, h/o necrotizing glomerulonephritis/vasculitis(+ ANCA), and pancytopenia secondary to cyclophosphamide. She presented to the ED with complaints of chronic diarrhea and intermittent N/V. She noted some blood in the stool and alerted outpatient GI provider who encouraged her to present to the ED. She had an EGD/colonoscopy in 07/2018 which showed an ulcerated mass-like lesion in the ileocecal valve with satellite ulcers, no malignancy noted, did show esophagitis and gastritis. She was admitted for c/o rectal bleeding, chronic diarrhea, non-AG acidosis secondary to diarrhea, and abnormal liver function.  **RD working remotely**  Patient is now having nausea, has not been eating well at all and has not drank any supplements in 3 days. Pt mainly drinking juice and eating some applesauce per documentation. Will continue supplements as pt has been confused/lethargic for the past day or so per MD note.  Per weight records, pt's weight has increased +12 lbs since admission 7/17. Per I/Os: +10.2L since admit.   Medications: Calcium carbonate BID,  Vitamin D tablet BID, MAG-OX tablet BID, Creon capsule TID, Multivitamin with minerals daily, IV Zofran QID, K-DUR tablet daily, K Phos tablet TID  Labs reviewed: CBGs: 73-110 Elevated K GFR: 23  Diet Order:   Diet Order            Diet 2 gram sodium Room service appropriate? Yes; Fluid consistency: Thin  Diet effective now              EDUCATION NEEDS:   Not appropriate for education at this time  Skin:  Skin Assessment: Reviewed RN Assessment  Last BM:  7/25  Height:   Ht Readings from Last 1 Encounters:  01/09/19 5\' 1"  (1.549 m)    Weight:   Wt Readings from Last 1 Encounters:  01/15/19 73 kg    Ideal Body Weight:  47.7 kg  BMI:  Body mass index is 30.41 kg/m.  Estimated Nutritional Needs:   Kcal:  2000-2200 kcal  Protein:  100-115 grams  Fluid:  >/= 2.5 L/day  Clayton Bibles, MS, RD, LDN [place] [facility] Pager: [phone] After Hours Pager: [phone]

## 2019-01-16 NOTE — Progress Notes (Signed)
This nurse spoke with husband, Elenore Rota, multiple times today to update. He requested to be called at discharge for discharge teaching.

## 2019-01-16 NOTE — Progress Notes (Signed)
[name]  PROGRESS NOTE    DAVONDA AUSLEY  XNT:[id number] DOB: [birth year] DOA: 01/06/2019 PCP: Nicoletta Dress, MD   Brief Narrative:   evalette montrose y.o.female,hypertension, hyperlipidemia, Asthma, Rheumatoid arthritis (formerly on humira->CMV colitis, HSV esophagitis February 2020), h/o necrotizing glomerulonephritis/ vasculitis(+ ANCA), pancytopenia secondary to cyclophosphamide , has c/o chronic diarrhea and some intermittent n/v, and apparently noticed some blood in stool and told to come to ER for evaluation by GI.  She was admitted for blood in stool and chronic diarrhea and abnormal liver function. She's now s/p colonoscopy with GI, awaiting bx results. CMV pending. With worsening edema 7/21, given dose of lasix x1.   Assessment & Plan:   Principal Problem:   ARF (acute renal failure) (HCC) Active Problems:   Rheumatoid arthritis involving multiple sites with positive rheumatoid factor (HCC)   History of hypertension   History of hypothyroidism   Abnormal liver function   Diarrhea   Pulmonary nodule   Metabolic acidosis   Severe protein-calorie malnutrition (HCC)   Rectal bleeding   LFTs abnormal   Hepatic cirrhosis (HCC)   Pancreatic insufficiency   Ascites   Blood in stool - GI consult, appreciate recommendations - Colonoscopy with localized inflammation at ileocecal valve, external and internal hemorrhoids - see report - bx taken (results pending) - ? Recurrent/persistent CMV  -CMV negative  Abnormal liver function  - Check GGT (elevated) - Check ferritin (elevated), iron (wnl), tibc (not calc), ceruloplasmin (wnl), alpha 1 antitripsin (wnl), ana (negative), antismooth muscle ab (wnl), check anti mitochondrial antibody (wnl) - Check MRCP ->with severe diffuse hepatic steatosis (can't exclude cirrhosis) - solitary subcentimeter R liver lesion, likely benign cyst ->recommend f/u MRI with and without IV contrast in 3-6  months - ? 2/2 methotrexate [place] vs fatty liver vs other per GI - Will need further w/u outpatient   Chronic diarrhea - Check celiac panel (gliadin antibodies - wnl, TTG - normal, and reticulin antibody pending) - Cholestyramine prn - Trial of creon  - Appreciate GI recs - resolved with colestid BID, imodium, and creon  Community Acquired Pneumonia  Fever:  - Bld Cx Neg - CXR with patchy airspace dz in L lung base (early pneumonia vs sequelae of aspiration) - Repeat 2 view CXR with small bilateral effusions and associated atelectasis vs consolidation - Ceftriaxone/azithro x 5 days  AKI on CKD III - creatinine worsened in setting of soft BP's on 7/18.  - baseline creatinine appears to be ~1.5, continue to monitor. - No hydro on imaging from earlier in hospitalization -renal function stable at about 2.1.  Right Greater than Left Effusions  Moderate Volume Ascites:  -s/p paracentesis w/ 4.5L removed - reports feeling much better today and wants to eat - monitor renal function (essentially unmoved from; s/p2dose albumin)  Hypotension/Hx of Hypertension - BP low normal, continue holding antihypertensives for now  Non- AG acidosis - Improved, d/c IV bicarb - Pt on PO bicarb; acidosis resolved, monitor  N/V - Zofran 4mg  iv q6h prn - improved after paracentesis  Hypothyroidism - Cont Levothyroxine 88 micrograms po qday  Anxiety - Cont Lexapro 10mg  po qday  Normocytic Anemia - stable, Hgb 9.5, no frank bleed noted  RA - Off Humira, follows with Deveshwar - Voltaren gel prn  Pulmonary Nodule - outpatient follow up  Right greater than Left LEE - RLE without DVT.  Hypoglycemia - poor PO intake; encourage PO intake - says she feels bloated and stomach is tight; will ask IR to eval for  paracentesis - now s/p  paracentesis; says that she feels hungry and wants to eat; encourage diet - continue encouraging diet     - hypoglycemic again; amp D50     - hypoglycemia ON, encourage diet  Hypomagnesemia/Hypophospatemia - replete, monitor  Debility - will ask PT to eval - PT recs SNF; spoke with patient, and she has agreed to SNF rehab  AMS     - lethargy yesterday, improved today; question if related to ammonia, lactulose was added; still not quite at baseline  Doing a little better on the confusion front this AM. Still with hypoglycemia ON. Needs to eat. Encourage diet. Continue lactulose.   DVT prophylaxis:SCDs Code Status:FULL Disposition Plan:TBD   Consultants:  GI  IR  Subjective: "I remember you."  Objective: Vitals:   01/15/19 2253 01/16/19 0505 01/16/19 1044 01/16/19 1400  BP: 132/72 138/69 (!) 104/45 112/68  Pulse: 84 88 (!) 49 68  Resp: 20 20 16 17   Temp: 98.1 F (36.7 C) 98 F (36.7 C) 98 F (36.7 C) 98.1 F (36.7 C)  TempSrc: Oral   Oral  SpO2: 97% 92% 91% 94%  Weight:      Height:        Intake/Output Summary (Last 24 hours) at 01/16/2019 1453 Last data filed at 01/16/2019 0600 Gross per 24 hour  Intake 258 ml  Output -  Net 258 ml   Filed Weights   01/13/19 0513 01/14/19 0430 01/15/19 0700  Weight: 71.3 kg 71.5 kg 73 kg    Examination:  General:68 y.o.femaleresting in bed in NAD Cardiovascular: RRR, +S1, S2, no m/g/r, equal pulses throughout Respiratory: CTABL, no w/r/r, normal WOB GI: BS+, NT,distended,soft,no masses noted, no organomegaly noted Skin: No rashes, bruises, ulcerations noted Neuro: alert to name, following commands, lethargic but improving    Data Reviewed: I have personally reviewed following labs and imaging studies.  CBC: Recent Labs  Lab 01/11/19 0513 01/12/19 0522 01/13/19 0705 01/14/19 0533 01/15/19 1305  WBC 9.3 10.2 11.0* 11.0* 8.3  NEUTROABS  --   --  8.2* 8.1*  --   HGB 9.2* 9.5*  9.3* 9.5* 9.6*  HCT 28.5* 29.7* 29.8* 30.1* 31.4*  MCV 95.3 94.6 99.0 97.4 100.3*  PLT 151 155 135* 133* 998*   Basic Metabolic Panel: Recent Labs  Lab 01/10/19 0534 01/11/19 0513 01/12/19 0522 01/13/19 0705 01/14/19 0533 01/15/19 1305 01/16/19 0833  NA 141 140 139 138 139 142 143  K 3.6 3.4* 4.0 4.3 4.6 4.6 5.3*  CL 108 107 108 107 107 109 112*  CO2 20* 20* 21* 22 21* 22 21*  GLUCOSE 67* 82 68* 87 87 80 94  BUN 25* 27* 30* 31* 34* 36* 36*  CREATININE 1.99* 2.06* 2.14* 2.22* 2.19* 2.16* 2.12*  CALCIUM 7.3* 7.6* 7.7* 7.8* 8.0* 7.9* 8.0*  MG 1.7 1.6* 1.6* 1.9 2.0  --   --   PHOS  --   --   --  2.6 2.0* 2.3* 2.5   GFR: Estimated Creatinine Clearance: 23.5 mL/min (A) (by C-G formula based on SCr of 2.12 mg/dL (H)). Liver Function Tests: Recent Labs  Lab 01/10/19 0534 01/11/19 0513 01/12/19 0522 01/12/19 2115 01/13/19 0705 01/14/19 0533 01/15/19 1305 01/16/19 0833  AST 53* 57* 55*  --   --   --   --   --   ALT 13 15 16   --   --   --   --   --   ALKPHOS 175* 206* 191*  --   --   --   --   --  BILITOT 1.5* 1.7* 2.1*  --   --   --   --   --   PROT 4.3* 4.6* 4.6*  --   --   --   --   --   ALBUMIN 1.5* 1.6* 1.6* 1.8* 1.8* 2.1* 2.1* 2.0*   No results for input(s): LIPASE, AMYLASE in the last 168 hours. Recent Labs  Lab 01/12/19 2115 01/16/19 0833  AMMONIA 68* 103*   Coagulation Profile: No results for input(s): INR, PROTIME in the last 168 hours. Cardiac Enzymes: No results for input(s): CKTOTAL, CKMB, CKMBINDEX, TROPONINI in the last 168 hours. BNP (last 3 results) No results for input(s): PROBNP in the last 8760 hours. HbA1C: No results for input(s): HGBA1C in the last 72 hours. CBG: Recent Labs  Lab 01/15/19 1143 01/15/19 1625 01/15/19 2137 01/16/19 0653 01/16/19 1140  GLUCAP 74 67* 86 73 110*   Lipid Profile: No results for input(s): CHOL, HDL, LDLCALC, TRIG, CHOLHDL, LDLDIRECT in the last 72 hours. Thyroid Function Tests: No results for input(s):  TSH, T4TOTAL, FREET4, T3FREE, THYROIDAB in the last 72 hours. Anemia Panel: No results for input(s): VITAMINB12, FOLATE, FERRITIN, TIBC, IRON, RETICCTPCT in the last 72 hours. Sepsis Labs: No results for input(s): PROCALCITON, LATICACIDVEN in the last 168 hours.  Recent Results (from the past 240 hour(s))  SARS Coronavirus 2 (CEPHEID - Performed in [place] hospital lab), Hosp Order     Status: None   Collection Time: 01/06/19  9:23 PM   Specimen: Nasopharyngeal Swab  Result Value Ref Range Status   SARS Coronavirus 2 NEGATIVE NEGATIVE Final    Comment: (NOTE) If result is NEGATIVE SARS-CoV-2 target nucleic acids are NOT DETECTED. The SARS-CoV-2 RNA is generally detectable in upper and lower  respiratory specimens during the acute phase of infection. The lowest  concentration of SARS-CoV-2 viral copies this assay can detect is 250  copies / mL. A negative result does not preclude SARS-CoV-2 infection  and should not be used as the sole basis for treatment or other  patient management decisions.  A negative result may occur with  improper specimen collection / handling, submission of specimen other  than nasopharyngeal swab, presence of viral mutation(s) within the  areas targeted by this assay, and inadequate number of viral copies  (<250 copies / mL). A negative result must be combined with clinical  observations, patient history, and epidemiological information. If result is POSITIVE SARS-CoV-2 target nucleic acids are DETECTED. The SARS-CoV-2 RNA is generally detectable in upper and lower  respiratory specimens dur ing the acute phase of infection.  Positive  results are indicative of active infection with SARS-CoV-2.  Clinical  correlation with patient history and other diagnostic information is  necessary to determine patient infection status.  Positive results do  not rule out bacterial infection or co-infection with other viruses. If result is PRESUMPTIVE POSTIVE  SARS-CoV-2 nucleic acids MAY BE PRESENT.   A presumptive positive result was obtained on the submitted specimen  and confirmed on repeat testing.  While 2019 novel coronavirus  (SARS-CoV-2) nucleic acids may be present in the submitted sample  additional confirmatory testing may be necessary for epidemiological  and / or clinical management purposes  to differentiate between  SARS-CoV-2 and other Sarbecovirus currently known to infect humans.  If clinically indicated additional testing with an alternate test  methodology [phone]) is advised. The SARS-CoV-2 RNA is generally  detectable in upper and lower respiratory sp ecimens during the acute  phase of infection. The expected  result is Negative. Fact Sheet for Patients:  StrictlyIdeas.no Fact Sheet for Healthcare Providers: BankingDealers.co.za This test is not yet approved or cleared by the [place] FDA and has been authorized for detection and/or diagnosis of SARS-CoV-2 by FDA under an Emergency Use Authorization (EUA).  This EUA will remain in effect (meaning this test can be used) for the duration of the COVID-19 declaration under Section 564(b)(1) of the Act, 21 U.S.C. section 360bbb-3(b)(1), unless the authorization is terminated or revoked sooner. Performed at [facility], [place] [address], [place], [place] [address]   Culture, blood (routine x 2)     Status: None   Collection Time: 01/07/19  5:46 PM   Specimen: BLOOD LEFT HAND  Result Value Ref Range Status   Specimen Description   Final    BLOOD LEFT HAND Performed at [facility] [address], [place], [place] [address]    Special Requests   Final    BOTTLES DRAWN AEROBIC ONLY Blood Culture results may not be optimal due to an inadequate volume of blood received in culture bottles Performed at [facility] [address] [place], [place] [address]     Culture   Final    NO GROWTH 5 DAYS Performed at [facility], [place] [address], [place], [place] [address]    Report Status 01/12/2019 FINAL  Final  Culture, blood (routine x 2)     Status: None   Collection Time: 01/07/19  5:46 PM   Specimen: BLOOD  Result Value Ref Range Status   Specimen Description   Final    BLOOD LEFT ANTECUBITAL Performed at [facility] [address], [place], [place] [address]    Special Requests   Final    BOTTLES DRAWN AEROBIC ONLY Blood Culture adequate volume Performed at [facility] [address] [place], [place] [address]    Culture   Final    NO GROWTH 5 DAYS Performed at [facility], [place] [address], [place], [place] [address]    Report Status 01/12/2019 FINAL  Final  Culture, body fluid-bottle     Status: None (Preliminary result)   Collection Time: 01/12/19  4:17 PM   Specimen: Fluid  Result Value Ref Range Status   Specimen Description FLUID PERITONEAL  Final   Special Requests BOTTLES DRAWN AEROBIC AND ANAEROBIC  Final   Culture   Final    NO GROWTH 4 DAYS Performed at [facility], [place] [address] [place], [place] [address]    Report Status PENDING  Incomplete  Gram stain     Status: None   Collection Time: 01/12/19  4:17 PM   Specimen: Fluid  Result Value Ref Range Status   Specimen Description FLUID PERITONEAL  Final   Special Requests NONE  Final   Gram Stain   Final    MODERATE WBC PRESENT, PREDOMINANTLY MONONUCLEAR NO ORGANISMS SEEN Performed at [facility], [place] [address], [place], [place] [address]    Report Status 01/12/2019 FINAL  Final  Novel Coronavirus, NAA (hospital order; send-out to ref lab)     Status: None   Collection Time: 01/14/19  2:00 PM   Specimen: Nasopharyngeal Swab; Respiratory  Result Value Ref Range Status   SARS-CoV-2, NAA NOT DETECTED NOT DETECTED Final    Comment: (NOTE) This test was developed and its performance characteristics  determined by [employer]. This test has not been FDA cleared or approved.  This test has been authorized by FDA under an Emergency Use Authorization (EUA). This test is only authorized for the duration of time the declaration that circumstances exist justifying the authorization of the emergency use of in vitro diagnostic tests for detection of SARS-CoV-2 virus and/or diagnosis of COVID-19 infection under section 564(b)(1) of the Act, 21 U.S.C. 836OQH-4(T)(6), unless the authorization is terminated or revoked sooner. When diagnostic testing is negative, the possibility of a false negative result should be considered in the context of a patient's recent exposures and the presence of clinical signs and symptoms consistent with COVID-19. An individual without symptoms of COVID-19 and who is not shedding SARS-CoV-2 virus would expect to have a negative (not detected) result in this assay. Performed  At: [facility] [place], [place] [id number] Rush Farmer MD [id number]    [place]  Final    Comment: Performed at [facility] [address] [place], [place] [address]         Radiology Studies: No results found.      Scheduled Meds: . calcium carbonate  500 mg of elemental calcium Oral BID   And  . cholecalciferol  200 Units Oral BID  . colestipol  1 g Oral BID  . escitalopram  10 mg Oral Daily  . feeding supplement  1 Container Oral BID BM  . feeding supplement (ENSURE ENLIVE)  237 mL Oral Q24H  . feeding supplement (PRO-STAT SUGAR FREE 64)  30 mL Oral BID  . lactulose  10 g Oral TID  . levothyroxine  88 mcg Oral Q0600  . lipase/protease/amylase  36,000 Units Oral TID AC  . magnesium oxide  400 mg Oral BID  . multivitamin with minerals  1 tablet Oral Daily  . nystatin  5 mL Oral QID  . ondansetron (ZOFRAN) IV  4 mg Intravenous Q8H  . sodium bicarbonate  650 mg Oral BID   Continuous  Infusions:   LOS: 9 days    Time spent: 25 minutes spent in the coordination of care today.    Jonnie Finner, DO [facility] Pager [phone]  If 7PM-7AM, please contact night-coverage [email or web address] Password [facility] 01/16/2019, 2:53 PM

## 2019-01-16 NOTE — Plan of Care (Signed)
  Problem: Education: Goal: Knowledge of General Education information will improve Description: Including pain rating scale, medication(s)/side effects and non-pharmacologic comfort measures Outcome: Progressing   Problem: Elimination: Goal: Will not experience complications related to bowel motility Outcome: Progressing Goal: Will not experience complications related to urinary retention Outcome: Progressing   Problem: Safety: Goal: Ability to remain free from injury will improve Outcome: Progressing   

## 2019-01-16 NOTE — TOC Progression Note (Signed)
Transition of Care [facility]) - Progression Note    Patient Details  Name: Kaylee Rasmussen MRN: [id number] Date of Birth: 29-May-1951  Transition of Care [facility]) CM/SW Contact  Joaquin Courts, RN Phone Number: 01/16/2019, 3:12 PM  Clinical Narrative: [employer] arranged to deliver 3-in-1 to bedside for home use.  Rep [place] notified.     Expected Discharge Plan: [place] Barriers to Discharge: Continued Medical Work up  Expected Discharge Plan and Services Expected Discharge Plan: [place]   Discharge Planning Services: CM Consult Post Acute Care Choice: [place] arrangements for the past 2 months: Single Family Home Expected Discharge Date: 01/08/19                                     Social Determinants of Health (SDOH) Interventions    Readmission Risk Interventions No flowsheet data found.

## 2019-01-17 LAB — GLUCOSE, CAPILLARY
Glucose-Capillary: 108 mg/dL — ABNORMAL HIGH (ref 70–99)
Glucose-Capillary: 115 mg/dL — ABNORMAL HIGH (ref 70–99)
Glucose-Capillary: 107 mg/dL — ABNORMAL HIGH (ref 70–99)

## 2019-01-17 LAB — CULTURE, BODY FLUID W GRAM STAIN -BOTTLE: Culture: NO GROWTH

## 2019-01-17 NOTE — TOC Progression Note (Signed)
Transition of Care [facility]) - Progression Note    Patient Details  Name: NICEY KRAH MRN: [id number] Date of Birth: 02-23-1951  Transition of Care [facility]) CM/SW [place], [place] Phone Number: [phone] 01/17/2019, 1:56 PM  Clinical Narrative:   Provided SNF bed offers to pt's husband. He and pt to review and make selection.  Pt had approved [employer] insurance auth as of 01/14/19 for 7 days.    Expected Discharge Plan: [place] Barriers to Discharge: Continued Medical Work up  Expected Discharge Plan and Services Expected Discharge Plan: [place]   Discharge Planning Services: CM Consult Post Acute Care Choice: [place] arrangements for the past 2 months: Single Family Home Expected Discharge Date: 01/08/19                                     Social Determinants of Health (SDOH) Interventions    Readmission Risk Interventions No flowsheet data found.

## 2019-01-17 NOTE — Progress Notes (Signed)
CSW received a call from [place] at [facility] stating that pt has a bed ready there and that pt's Josem Kaufmann has been received.  CSW called pt's RN who stated she will check with pt's provider and call the CSW back.  CSW will continue to follow for D/C needs.  Alphonse Guild. Shanavia Makela, LCSW, LCAS, CSI Transitions of Care Clinical Social Worker Care Coordination Department Ph: [phone]

## 2019-01-17 NOTE — Progress Notes (Addendum)
CSW called [employer] for the pt's auth, which per notes, was received and pt was approved for 7 days as of 7/24.  CSW then reviewed notes and sees that [facility] CM "Received call from [employer] rep-they have auth for SNF-auth#[id number]" on 7/24".  CSW provided auth # to [place] at [facility] and CSW awaiting return call from pt's RN with MD's decision regarding D/C.  CSW will continue to follow for D/C needs.  Alphonse Guild. Wei Newbrough, LCSW, LCAS, CSI Transitions of Care Clinical Social Worker Care Coordination Department Ph: [phone]

## 2019-01-17 NOTE — Progress Notes (Signed)
No blood collected this am per phlebotomist. Pt was assessed & attempted twice without success due to poor venous access. Provider paged with update.

## 2019-01-17 NOTE — Plan of Care (Signed)
Patient will use breathing exercises to assist with episodes of anxiety.

## 2019-01-17 NOTE — Progress Notes (Signed)
Per Juliann Pulse at [facility]:  CSW received a call from [place] at [facility] stating the patient has been offered a bed and has been accepted and that the pt can arrive on 01/17/19.  The pt's accepting doctor is SNF MD.  The room number will be 101.  The number for report is [phone].  CSW will update RN once the provider makes a decision.  Alphonse Guild. Lacole Komorowski, Latanya Presser, LCAS Clinical Social Worker Ph: [phone]

## 2019-01-17 NOTE — Progress Notes (Signed)
Physical Therapy Treatment Patient Details Name: Kaylee Rasmussen MRN: [id number] DOB: [birth year] Today's Date: 01/17/2019    History of Present Illness 68 yo female admitted to ED 7/16 with rectal bleeding, CT abdomen pelvis revealing ? liver cirrhosis, ascites, small pleural effusions. Colonoscopy 7/19 reveals internal and external hemorrhoids, diverticulosis. 7/22 paracentesis yielding 4.5L clear yellow fluid. PMH includes HTN, HLD, asthma, RA, necrotizing glomerulonephritis/vasculitis, pancytopenia, chronic diarrhea with N/V.    PT Comments    Pt overall feels weak, tired and discouraged.  Assisted OOB to attempt amb to bathroom.  "I don't think I can".  Assisted to EOB.  General bed mobility comments: required increased assist with increased c/o ABD size, cramping.  great difficulty self performing due to ABD girth.  Required increased assist back to bed. Pt significantly weaker with extended length of stay and poor po intake.  Pt does NOT want to go to a SNF but does understand why as her spouse is unable to care for her at this level.    Follow Up Recommendations  SNF     Equipment Recommendations  None recommended by PT    Recommendations for Other Services       Precautions / Restrictions Precautions Precautions: Fall Restrictions Weight Bearing Restrictions: No    Mobility  Bed Mobility Overal bed mobility: Needs Assistance Bed Mobility: Supine to Sit;Sit to Supine     Supine to sit: Mod assist;Max assist Sit to supine: Max assist   General bed mobility comments: required increased assist with increased c/o ABD size, cramping.  great difficulty self performing due to ABD girth.  required increased assist back to bed.  Transfers Overall transfer level: Needs assistance Equipment used: 1 person hand held assist Transfers: Sit to/from [employer] Transfers   Stand pivot transfers: Mod assist       General transfer comment: increased difficulty slf performing  due to weakness and mild c/o dizziness.  Pt also reports poor po intake and overall feeling "bad".  Ambulation/Gait             General Gait Details: transfers only this session due to tolerance   Stairs             Wheelchair Mobility    Modified Rankin (Stroke Patients Only)       Balance                                            Cognition Arousal/Alertness: Awake/alert Behavior During Therapy: WFL for tasks assessed/performed;Flat affect Overall Cognitive Status: Within Functional Limits for tasks assessed                                 General Comments: requires MAX encouragement due to state of health      Exercises      General Comments        Pertinent Vitals/Pain Pain Assessment: Faces Faces Pain Scale: Hurts little more Pain Location: abdomen Pain Descriptors / Indicators: Tightness;Cramping;Grimacing;Other (Comment)(bloated) Pain Intervention(s): Monitored during session    Home Living                      Prior Function            PT Goals (current goals can now be found in the care plan section) Progress towards  PT goals: Progressing toward goals    Frequency    Min 2X/week      PT Plan Current plan remains appropriate    Co-evaluation              AM-PAC PT "6 Clicks" Mobility   Outcome Measure  Help needed turning from your back to your side while in a flat bed without using bedrails?: A Lot Help needed moving from lying on your back to sitting on the side of a flat bed without using bedrails?: A Lot Help needed moving to and from a bed to a chair (including a wheelchair)?: A Lot Help needed standing up from a chair using your arms (e.g., wheelchair or bedside chair)?: A Lot Help needed to walk in hospital room?: Total Help needed climbing 3-5 steps with a railing? : Total 6 Click Score: 10    End of Session Equipment Utilized During Treatment: Gait belt Activity  Tolerance: Patient limited by fatigue;Patient limited by pain Patient left: in bed;with bed alarm set;with call bell/phone within reach Nurse Communication: Mobility status PT Visit Diagnosis: Other abnormalities of gait and mobility (R26.89);Muscle weakness (generalized) (M62.81);History of falling (Z91.81)     Time: 1436-1500 PT Time Calculation (min) (ACUTE ONLY): 24 min  Charges:  $Therapeutic Activity: 23-37 mins                    Rica Koyanagi  PTA Acute  Rehabilitation Services Pager      [phone] Office      [phone]

## 2019-01-17 NOTE — Progress Notes (Signed)
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  She was admitted for blood in stool and chronic diarrhea and abnormal liver function. She's now s/p colonoscopy with GI, awaiting bx results. CMV pending. With worsening edema 7/21, given dose of lasix x1.   Assessment & Plan:   Principal Problem:   ARF (acute renal failure) (HCC) Active Problems:   Rheumatoid arthritis involving multiple sites with positive rheumatoid factor (HCC)   History of hypertension   History of hypothyroidism   Abnormal liver function   Diarrhea   Pulmonary nodule   Metabolic acidosis   Severe protein-calorie malnutrition (HCC)   Rectal bleeding   LFTs abnormal   Hepatic cirrhosis (HCC)   Pancreatic insufficiency   Ascites   Blood in stool - GI consult, appreciate recommendations - Colonoscopy with localized inflammation at ileocecal valve, external and internal hemorrhoids - see report - bx taken (results pending) - ? Recurrent/persistent CMV  -CMV negative  Abnormal liver function  - Check GGT (elevated) - Check ferritin (elevated), iron (wnl), tibc (not calc), ceruloplasmin (wnl), alpha 1 antitripsin (wnl), ana (negative), antismooth muscle ab (wnl), check anti mitochondrial antibody (wnl) - Check MRCP ->with severe diffuse hepatic steatosis (can't exclude cirrhosis) - solitary subcentimeter R liver lesion, likely benign cyst ->recommend f/u MRI with and without IV contrast in 3-6  months - ? 2/2 methotrexate [place] vs fatty liver vs other per GI - Will need further w/u outpatient   Chronic diarrhea - Check celiac panel (gliadin antibodies - wnl, TTG - normal, and reticulin antibody pending) - Cholestyramine prn - Trial of creon  - Appreciate GI recs - resolved with colestid BID, imodium, and creon  Community Acquired Pneumonia  Fever:  - Bld Cx Neg - CXR with patchy airspace dz in L lung base (early pneumonia vs sequelae of aspiration) - Repeat 2 view CXR with small bilateral effusions and associated atelectasis vs consolidation - Ceftriaxone/azithro x 5 days  AKI on CKD III - creatinine worsened in setting of soft BP's on 7/18.  - baseline creatinine appears to be ~1.5, continue to monitor. - No hydro on imaging from earlier in hospitalization -renal function stable at about 2.1.  Right Greater than Left Effusions  Moderate Volume Ascites:  -s/p paracentesis w/ 4.5L removed - reports feeling much better today and wants to eat - monitor renal function (essentially unmoved from; s/p2dose albumin)  Hypotension/Hx of Hypertension - BP low normal, continue holding antihypertensives for now  Non- AG acidosis - Improved, d/c IV bicarb - Pt on PO bicarb; acidosis resolved, monitor  N/V - Zofran 4mg  iv q6h prn - improved after paracentesis  Hypothyroidism - Cont Levothyroxine 88 micrograms po qday  Anxiety - Cont Lexapro 10mg  po qday  Normocytic Anemia - stable, Hgb 9.5, no frank bleed noted  RA - Off Humira, follows with Deveshwar - Voltaren gel prn  Pulmonary Nodule - outpatient follow up  Right greater than Left LEE - RLE without DVT.  Hypoglycemia - poor PO intake; encourage PO intake - says she feels bloated and stomach is tight; will ask IR to eval for  paracentesis - now s/p  paracentesis; says that she feels hungry and wants to eat; encourage diet - continue encouraging diet - hypoglycemic again; amp D50     - hypoglycemia ON, encourage diet  Hypomagnesemia/Hypophospatemia - replete, monitor  Debility - will ask PT to eval - PT recs SNF; spoke with patient, and she has agreed to SNF rehab  AMS     - lethargy yesterday, improved today; question if related to ammonia, lactulose was added; still not quite at baseline     - continue lactulose, she's more clear of mind this morning  Looks like husband has convinced her to go to SNF. CM working out options. Ok to d/c to SNF as soon as bed available. Coronovirus updated 7/24   DVT prophylaxis:SCDs Code Status:FULL Disposition Plan:TBD   Consultants:  GI  IR  Subjective: "It looks like my husband agrees with you."  Objective: Vitals:   01/16/19 1400 01/16/19 2213 01/17/19 0517 01/17/19 1220  BP: 112/68 (!) 145/71 (!) 135/50 107/67  Pulse: 68 93 89 (!) 102  Resp: 17 16 18 18   Temp: 98.1 F (36.7 C) 98.3 F (36.8 C) 97.8 F (36.6 C) 98.5 F (36.9 C)  TempSrc: Oral Oral Oral Oral  SpO2: 94% 94% 100% 94%  Weight:      Height:        Intake/Output Summary (Last 24 hours) at 01/17/2019 1447 Last data filed at 01/17/2019 1325 Gross per 24 hour  Intake 440 ml  Output -  Net 440 ml   Filed Weights   01/13/19 0513 01/14/19 0430 01/15/19 0700  Weight: 71.3 kg 71.5 kg 73 kg    Examination:  General:68 y.o.femaleresting in bed in NAD Cardiovascular: RRR, +S1, S2, no m/g/r, equal pulses throughout Respiratory: CTABL, no w/r/r, normal WOB GI: BS+, NT,distended,soft,no masses noted, no organomegaly noted Skin: No rashes, bruises, ulcerations noted Neuro:A&O x 4, more interactive today.     Data Reviewed: I have personally reviewed following labs and imaging studies.  CBC: Recent Labs  Lab 01/11/19 0513 01/12/19 0522 01/13/19 0705 01/14/19 0533  01/15/19 1305  WBC 9.3 10.2 11.0* 11.0* 8.3  NEUTROABS  --   --  8.2* 8.1*  --   HGB 9.2* 9.5* 9.3* 9.5* 9.6*  HCT 28.5* 29.7* 29.8* 30.1* 31.4*  MCV 95.3 94.6 99.0 97.4 100.3*  PLT 151 155 135* 133* 478*   Basic Metabolic Panel: Recent Labs  Lab 01/11/19 0513 01/12/19 0522 01/13/19 0705 01/14/19 0533 01/15/19 1305 01/16/19 0833  NA 140 139 138 139 142 143  K 3.4* 4.0 4.3 4.6 4.6 5.3*  CL 107 108 107 107 109 112*  CO2 20* 21* 22 21* 22 21*  GLUCOSE 82 68* 87 87 80 94  BUN 27* 30* 31* 34* 36* 36*  CREATININE 2.06* 2.14* 2.22* 2.19* 2.16* 2.12*  CALCIUM 7.6* 7.7* 7.8* 8.0* 7.9* 8.0*  MG 1.6* 1.6* 1.9 2.0  --   --   PHOS  --   --  2.6 2.0* 2.3* 2.5   GFR: Estimated Creatinine Clearance: 23.5 mL/min (A) (by C-G formula based on SCr of 2.12 mg/dL (H)). Liver Function Tests: Recent Labs  Lab 01/11/19 0513 01/12/19 0522 01/12/19 2115 01/13/19 0705 01/14/19 0533 01/15/19 1305 01/16/19 0833  AST 57* 55*  --   --   --   --   --   ALT 15 16  --   --   --   --   --   ALKPHOS 206* 191*  --   --   --   --   --  BILITOT 1.7* 2.1*  --   --   --   --   --   PROT 4.6* 4.6*  --   --   --   --   --   ALBUMIN 1.6* 1.6* 1.8* 1.8* 2.1* 2.1* 2.0*   No results for input(s): LIPASE, AMYLASE in the last 168 hours. Recent Labs  Lab 01/12/19 2115 01/16/19 0833  AMMONIA 68* 103*   Coagulation Profile: No results for input(s): INR, PROTIME in the last 168 hours. Cardiac Enzymes: No results for input(s): CKTOTAL, CKMB, CKMBINDEX, TROPONINI in the last 168 hours. BNP (last 3 results) No results for input(s): PROBNP in the last 8760 hours. HbA1C: No results for input(s): HGBA1C in the last 72 hours. CBG: Recent Labs  Lab 01/16/19 0653 01/16/19 1140 01/16/19 1643 01/17/19 0636 01/17/19 1154  GLUCAP 73 110* 90 108* 115*   Lipid Profile: No results for input(s): CHOL, HDL, LDLCALC, TRIG, CHOLHDL, LDLDIRECT in the last 72 hours. Thyroid Function Tests: No results for input(s):  TSH, T4TOTAL, FREET4, T3FREE, THYROIDAB in the last 72 hours. Anemia Panel: No results for input(s): VITAMINB12, FOLATE, FERRITIN, TIBC, IRON, RETICCTPCT in the last 72 hours. Sepsis Labs: No results for input(s): PROCALCITON, LATICACIDVEN in the last 168 hours.  Recent Results (from the past 240 hour(s))  Culture, blood (routine x 2)     Status: None   Collection Time: 01/07/19  5:46 PM   Specimen: BLOOD LEFT HAND  Result Value Ref Range Status   Specimen Description   Final    BLOOD LEFT HAND Performed at [facility] [address] [place], [place] [address]    Special Requests   Final    BOTTLES DRAWN AEROBIC ONLY Blood Culture results may not be optimal due to an inadequate volume of blood received in culture bottles Performed at [facility] [address] [place], [place] [address]    Culture   Final    NO GROWTH 5 DAYS Performed at [facility], [place] [address], [place], [place] [address]    Report Status 01/12/2019 FINAL  Final  Culture, blood (routine x 2)     Status: None   Collection Time: 01/07/19  5:46 PM   Specimen: BLOOD  Result Value Ref Range Status   Specimen Description   Final    BLOOD LEFT ANTECUBITAL Performed at [facility] [address], [place], [place] [address]    Special Requests   Final    BOTTLES DRAWN AEROBIC ONLY Blood Culture adequate volume Performed at [facility] [address], [place], [place] [address]    Culture   Final    NO GROWTH 5 DAYS Performed at [facility], [place] [address], [place], [place] [address]    Report Status 01/12/2019 FINAL  Final  Culture, body fluid-bottle     Status: None   Collection Time: 01/12/19  4:17 PM   Specimen: Fluid  Result Value Ref Range Status   Specimen Description FLUID PERITONEAL  Final   Special Requests BOTTLES DRAWN AEROBIC AND ANAEROBIC  Final   Culture   Final    NO GROWTH 5 DAYS Performed at  [facility], [place] [address] [place], [place] [address]    Report Status 01/17/2019 FINAL  Final  Gram stain     Status: None   Collection Time: 01/12/19  4:17 PM   Specimen: Fluid  Result Value Ref Range Status   Specimen  Description FLUID PERITONEAL  Final   Special Requests NONE  Final   Gram Stain   Final    MODERATE WBC PRESENT, PREDOMINANTLY MONONUCLEAR NO ORGANISMS SEEN Performed at [facility], [place] [address], [place], [place] [address]    Report Status 01/12/2019 FINAL  Final  Novel Coronavirus, NAA (hospital order; send-out to ref lab)     Status: None   Collection Time: 01/14/19  2:00 PM   Specimen: Nasopharyngeal Swab; Respiratory  Result Value Ref Range Status   SARS-CoV-2, NAA NOT DETECTED NOT DETECTED Final    Comment: (NOTE) This test was developed and its performance characteristics determined by [employer]. This test has not been FDA cleared or approved. This test has been authorized by FDA under an Emergency Use Authorization (EUA). This test is only authorized for the duration of time the declaration that circumstances exist justifying the authorization of the emergency use of in vitro diagnostic tests for detection of SARS-CoV-2 virus and/or diagnosis of COVID-19 infection under section 564(b)(1) of the Act, 21 U.S.C. 638GTX-6(I)(6), unless the authorization is terminated or revoked sooner. When diagnostic testing is negative, the possibility of a false negative result should be considered in the context of a patient's recent exposures and the presence of clinical signs and symptoms consistent with COVID-19. An individual without symptoms of COVID-19 and who is not shedding SARS-CoV-2 virus would expect to have a negative (not detected) result in this assay. Performed  At: [facility] [place], [place] [id number] Rush Farmer MD GN:[id number]    [place]  Final    Comment:  Performed at [facility] [address] [place], [place] [address]         Radiology Studies: No results found.      Scheduled Meds: . calcium carbonate  500 mg of elemental calcium Oral BID   And  . cholecalciferol  200 Units Oral BID  . colestipol  1 g Oral BID  . escitalopram  10 mg Oral Daily  . feeding supplement  1 Container Oral BID BM  . feeding supplement (ENSURE ENLIVE)  237 mL Oral Q24H  . feeding supplement (PRO-STAT SUGAR FREE 64)  30 mL Oral BID  . lactulose  10 g Oral TID  . levothyroxine  88 mcg Oral Q0600  . lipase/protease/amylase  36,000 Units Oral TID AC  . magnesium oxide  400 mg Oral BID  . multivitamin with minerals  1 tablet Oral Daily  . ondansetron (ZOFRAN) IV  4 mg Intravenous Q8H  . sodium bicarbonate  650 mg Oral BID   Continuous Infusions:   LOS: 10 days    Time spent: 25 minutes spent in the coordination of care today.    Jonnie Finner, DO [facility] Pager [phone]  If 7PM-7AM, please contact night-coverage [email or web address] Password [facility] 01/17/2019, 2:47 PM

## 2019-01-17 NOTE — Care Management Important Message (Signed)
Important Message  Patient Details IM Letter given to Sharren Bridge SW to present to the Patient Name: Kaylee Rasmussen MRN: [id number] Date of Birth: September 26, 1950   Medicare Important Message Given:  Yes     Kerin Salen 01/17/2019, 11:33 AM

## 2019-01-17 NOTE — Progress Notes (Addendum)
Patients complained of pain with flushingIV site appears to be occluded. Site removed. Will attempt restart. Hospitalist provider updated. Provider request site be replaced d/t DM hx. Pt has not had any hypoglycemic events in the last 24 hours. Will contact IV RN for assistance.

## 2019-01-18 LAB — GLUCOSE, CAPILLARY
Glucose-Capillary: 81 mg/dL (ref 70–99)
Glucose-Capillary: 115 mg/dL — ABNORMAL HIGH (ref 70–99)
Glucose-Capillary: 83 mg/dL (ref 70–99)

## 2019-01-18 LAB — NOVEL CORONAVIRUS, NAA (HOSP ORDER, SEND-OUT TO REF LAB; TAT 18-24 HRS): SARS-CoV-2, NAA: NOT DETECTED

## 2019-01-18 MED ORDER — LACTULOSE 10 GM/15ML PO SOLN: 10.0000 g | Freq: Three times a day (TID) | ORAL | 0 refills | Status: DC

## 2019-01-18 MED ORDER — SODIUM BICARBONATE 650 MG PO TABS: 650.0000 mg | ORAL_TABLET | Freq: Two times a day (BID) | ORAL | 0 refills | Status: DC

## 2019-01-18 MED ORDER — COLESTIPOL HCL 1 G PO TABS: 1.0000 g | ORAL_TABLET | Freq: Two times a day (BID) | ORAL | 0 refills | Status: AC

## 2019-01-18 MED ORDER — OXYCODONE HCL 5 MG PO TABS: 5.0000 mg | ORAL_TABLET | ORAL | 0 refills | Status: DC | PRN

## 2019-01-18 MED ORDER — PANCRELIPASE (LIP-PROT-AMYL) 36000-114000 UNITS PO CPEP: 36000.0000 [IU] | ORAL_CAPSULE | Freq: Three times a day (TID) | ORAL | 1 refills | Status: DC

## 2019-01-18 MED ORDER — MAGNESIUM OXIDE 400 (241.3 MG) MG PO TABS: 400.0000 mg | ORAL_TABLET | Freq: Two times a day (BID) | ORAL | 0 refills | Status: DC

## 2019-01-18 MED ORDER — DICLOFENAC SODIUM 1 % TD GEL: 4.0000 g | Freq: Three times a day (TID) | TRANSDERMAL | 0 refills | Status: AC | PRN

## 2019-01-18 NOTE — Plan of Care (Signed)
Discharge plan reviewed with patient this am. Questions, concerns denied.

## 2019-01-18 NOTE — Progress Notes (Signed)
To assist with preparation for Discharge to SNF spouse took belongings home. Bag of clothing wheelchair, and bedside commode sent with spouse.  Spouse delivered patients suitcase. Suitcase placed in patients room.

## 2019-01-18 NOTE — Progress Notes (Signed)
Call placed to spouse Elenore Rota to update regarding discharge plan. Questions concerns denied

## 2019-01-18 NOTE — TOC Progression Note (Signed)
Transition of Care [facility]) - Progression Note    Patient Details  Name: LILYAUNA MIEDEMA MRN: [id number] Date of Birth: 08/11/1950  [facility]) CM/SW Contact  Joaquin Courts, RN Phone Number: 01/18/2019, 10:43 AM  Clinical Narrative:    Covid test still pending, prior auth submitted for ambulance transport.    Expected Discharge Plan: [place] Barriers to Discharge: Continued Medical Work up  Expected Discharge Plan and Services Expected Discharge Plan: [place]   Discharge Planning Services: CM Consult Post Acute Care Choice: [employer] arrangements for the past 2 months: Single Family Home Expected Discharge Date: 01/08/19                                     Social Determinants of Health (SDOH) Interventions    Readmission Risk Interventions No flowsheet data found.

## 2019-01-18 NOTE — Progress Notes (Signed)
Pt can admit to [facility] SNF once covid19 test has resulted. Currently in progress.  Spoke with HTA on call and with facility this morning to confirm.  Sharren Bridge, MSW, LCSW Transitions of Care 01/18/2019 [phone]

## 2019-01-18 NOTE — TOC Progression Note (Signed)
Transition of Care [facility]) - Progression Note    Patient Details  Name: Kaylee Rasmussen MRN: [id number] Date of Birth: 10-23-1950  Transition of Care [facility]) CM/SW Contact  Joaquin Courts, RN Phone Number: 01/18/2019, 1:05 PM  Clinical Narrative:    Non emergency transport auth number [id number], good for ninety days.    Expected Discharge Plan: [place] Barriers to Discharge: Continued Medical Work up  Expected Discharge Plan and Services Expected Discharge Plan: [place]   Discharge Planning Services: CM Consult Post Acute Care Choice: [place] arrangements for the past 2 months: Single Family Home Expected Discharge Date: 01/08/19                                     Social Determinants of Health (SDOH) Interventions    Readmission Risk Interventions No flowsheet data found.

## 2019-01-18 NOTE — Progress Notes (Signed)
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  She was admitted for blood in stool and chronic diarrhea and abnormal liver function. She's now s/p colonoscopy with GI, awaiting bx results. CMV pending. With worsening edema 7/21, given dose of lasix x1.   Assessment & Plan:   Principal Problem:   ARF (acute renal failure) (HCC) Active Problems:   Rheumatoid arthritis involving multiple sites with positive rheumatoid factor (HCC)   History of hypertension   History of hypothyroidism   Abnormal liver function   Diarrhea   Pulmonary nodule   Metabolic acidosis   Severe protein-calorie malnutrition (HCC)   Rectal bleeding   LFTs abnormal   Hepatic cirrhosis (HCC)   Pancreatic insufficiency   Ascites   Blood in stool - GI consult, appreciate recommendations - Colonoscopy with localized inflammation at ileocecal valve, external and internal hemorrhoids - see report - bx taken (results pending) - ? Recurrent/persistent CMV  -CMV negative     - resolved  Abnormal liver function  - Check GGT (elevated) - Check ferritin (elevated), iron (wnl), tibc (not calc), ceruloplasmin (wnl), alpha 1 antitripsin (wnl), ana (negative), antismooth muscle ab (wnl), check anti mitochondrial antibody (wnl) - Check MRCP ->with severe diffuse hepatic steatosis (can't exclude cirrhosis) - solitary subcentimeter R liver lesion, likely benign cyst ->recommend f/u MRI with and without IV  contrast in 3-6 months - ? 2/2 methotrexate [place] vs fatty liver vs other per GI - Will need further w/u outpatient; has appt set up w/ GI  Chronic diarrhea - Check celiac panel (gliadin antibodies - wnl, TTG - normal, and reticulin antibody pending) - Cholestyramine prn - Trial of creon  - Appreciate GI recs - resolved with colestid BID, imodium, and creon  Community Acquired Pneumonia   Fever:  - Bld Cx Neg - CXR with patchy airspace dz in L lung base (early pneumonia vs sequelae of aspiration) - Repeat 2 view CXR with small bilateral effusions and associated atelectasis vs consolidation - Ceftriaxone/azithro x 5 days  AKI on CKD III - creatinine worsened in setting of soft BP's on 7/18.  - baseline creatinine appears to be ~1.5, continue to monitor. - No hydro on imaging from earlier in hospitalization -renal function stable at about 2.1; given comorbidities, this will probably be about her new baseline.  Right Greater than Left Effusions   Moderate Volume Ascites:  -s/p paracentesis w/ 4.5L removed - reports feeling much better today and wants to eat - monitor renal function (essentially unmoved from; s/p2dose albumin)  Hypotension/Hx of Hypertension - BP low normal, continue holding antihypertensives for now  Non- AG acidosis - Improved, d/c IV bicarb - Pt on PO bicarb; acidosis resolved, monitor  N/V - Zofran 4mg  iv q6h prn - improved after paracentesis  Hypothyroidism - Cont Levothyroxine 88 micrograms po qday  Anxiety - Cont Lexapro 10mg  po qday  Normocytic Anemia - stable, no frank bleed noted, monitor  RA - Off Humira, follows with Deveshwar - Voltaren gel prn  Pulmonary Nodule - outpatient follow up  Right greater than Left LEE - RLE without DVT.  Hypoglycemia -  poor PO intake; encourage PO intake - says  she feels bloated and stomach is tight; will ask IR to eval for paracentesis - now s/p paracentesis; says that she feels hungry and wants to eat; encourage diet - continue encouraging diet - glucoses stable ON; will need to continue to encourage diet  Hypomagnesemia/Hypophospatemia - replete, monitor  Debility - will ask PT to eval - PT recs SNF; spoke with patient, and she has agreed to SNF rehab     - bed with [facility] - lethargy yesterday, improved today; question if related to ammonia, lactulose was added; still not quite at baseline - continue lactulose, she's more clear of mind this morning     - mentation good; she's A&O x 4, lethargy much improved, she answers somewhat slowly to questioning, but this seems more signs of frustration than anything else  She is doing better. Mentation improved. PO intake continues to need to be encouraged. Will go to SNF for rehab prior to going home.   DVT prophylaxis:SCDs Code Status:FULL Disposition Plan:TBD   Consultants:  GI  IR  Subjective: "It's you again."  Objective: Vitals:   01/17/19 1220 01/17/19 2113 01/18/19 0634 01/18/19 1324  BP: 107/67 120/61 113/63 123/73  Pulse: (!) 102 91 87 88  Resp: 18 18 18 16   Temp: 98.5 F (36.9 C) 98.2 F (36.8 C) 98 F (36.7 C) 97.9 F (36.6 C)  TempSrc: Oral Oral Oral Oral  SpO2: 94% 95% 93% 100%  Weight:   70.2 kg   Height:        Intake/Output Summary (Last 24 hours) at 01/18/2019 1542 Last data filed at 01/18/2019 1324 Gross per 24 hour  Intake 180 ml  Output 65 ml  Net 115 ml   Filed Weights   01/14/19 0430 01/15/19 0700 01/18/19 0634  Weight: 71.5 kg 73 kg 70.2 kg    Examination:  General:68 y.o.femaleresting in bed in NAD Cardiovascular: RRR, +S1, S2, no m/g/r, equal pulses throughout Respiratory: CTABL, no w/r/r, normal WOB GI: BS+, NT,distended,soft,no masses noted, no organomegaly noted Skin: No  rashes, bruises, ulcerations noted Neuro:A&O x 4, no focal deficits; appropriately interactive.    Data Reviewed: I have personally reviewed following labs and imaging studies.  CBC: Recent Labs  Lab 01/12/19 0522 01/13/19 0705 01/14/19 0533 01/15/19 1305  WBC 10.2 11.0* 11.0* 8.3  NEUTROABS  --  8.2* 8.1*  --   HGB 9.5* 9.3* 9.5* 9.6*  HCT 29.7* 29.8* 30.1* 31.4*  MCV 94.6 99.0 97.4 100.3*  PLT 155 135* 133* 828*   Basic Metabolic Panel: Recent Labs  Lab 01/12/19 0522 01/13/19 0705 01/14/19 0533 01/15/19 1305 01/16/19 0833  NA 139 138 139 142 143  K 4.0 4.3 4.6 4.6 5.3*  CL 108 107 107 109 112*  CO2 21* 22 21* 22 21*  GLUCOSE 68* 87 87 80 94  BUN 30* 31* 34* 36* 36*  CREATININE 2.14* 2.22* 2.19* 2.16* 2.12*  CALCIUM 7.7* 7.8* 8.0* 7.9* 8.0*  MG 1.6* 1.9 2.0  --   --   PHOS  --  2.6 2.0* 2.3* 2.5   GFR: Estimated Creatinine Clearance: 23.1 mL/min (A) (by C-G formula based on SCr of 2.12 mg/dL (H)). Liver Function Tests: Recent Labs  Lab 01/12/19 0522 01/12/19 2115 01/13/19 0705 01/14/19 0533 01/15/19 1305 01/16/19 0833  AST 55*  --   --   --   --   --   ALT 16  --   --   --   --   --  ALKPHOS 191*  --   --   --   --   --   BILITOT 2.1*  --   --   --   --   --   PROT 4.6*  --   --   --   --   --   ALBUMIN 1.6* 1.8* 1.8* 2.1* 2.1* 2.0*   No results for input(s): LIPASE, AMYLASE in the last 168 hours. Recent Labs  Lab 01/12/19 2115 01/16/19 0833  AMMONIA 68* 103*   Coagulation Profile: No results for input(s): INR, PROTIME in the last 168 hours. Cardiac Enzymes: No results for input(s): CKTOTAL, CKMB, CKMBINDEX, TROPONINI in the last 168 hours. BNP (last 3 results) No results for input(s): PROBNP in the last 8760 hours. HbA1C: No results for input(s): HGBA1C in the last 72 hours. CBG: Recent Labs  Lab 01/17/19 0636 01/17/19 1154 01/17/19 1651 01/18/19 0751 01/18/19 1128  GLUCAP 108* 115* 107* 81 115*   Lipid Profile: No results for  input(s): CHOL, HDL, LDLCALC, TRIG, CHOLHDL, LDLDIRECT in the last 72 hours. Thyroid Function Tests: No results for input(s): TSH, T4TOTAL, FREET4, T3FREE, THYROIDAB in the last 72 hours. Anemia Panel: No results for input(s): VITAMINB12, FOLATE, FERRITIN, TIBC, IRON, RETICCTPCT in the last 72 hours. Sepsis Labs: No results for input(s): PROCALCITON, LATICACIDVEN in the last 168 hours.  Recent Results (from the past 240 hour(s))  Culture, body fluid-bottle     Status: None   Collection Time: 01/12/19  4:17 PM   Specimen: Fluid  Result Value Ref Range Status   Specimen Description FLUID PERITONEAL  Final   Special Requests BOTTLES DRAWN AEROBIC AND ANAEROBIC  Final   Culture   Final    NO GROWTH 5 DAYS Performed at [facility], [address] [place], [place] [address]    Report Status 01/17/2019 FINAL  Final  Gram stain     Status: None   Collection Time: 01/12/19  4:17 PM   Specimen: Fluid  Result Value Ref Range Status   Specimen Description FLUID PERITONEAL  Final   Special Requests NONE  Final   Gram Stain   Final    MODERATE WBC PRESENT, PREDOMINANTLY MONONUCLEAR NO ORGANISMS SEEN Performed at [facility], [place] [address], [place], [place] [address]    Report Status 01/12/2019 FINAL  Final  Novel Coronavirus, NAA (hospital order; send-out to ref lab)     Status: None   Collection Time: 01/14/19  2:00 PM   Specimen: Nasopharyngeal Swab; Respiratory  Result Value Ref Range Status   SARS-CoV-2, NAA NOT DETECTED NOT DETECTED Final    Comment: (NOTE) This test was developed and its performance characteristics determined by [employer]. This test has not been FDA cleared or approved. This test has been authorized by FDA under an Emergency Use Authorization (EUA). This test is only authorized for the duration of time the declaration that circumstances exist justifying the authorization of the emergency use of in vitro diagnostic tests for detection of  SARS-CoV-2 virus and/or diagnosis of COVID-19 infection under section 564(b)(1) of the Act, 21 U.S.C. 081KGY-1(E)(5), unless the authorization is terminated or revoked sooner. When diagnostic testing is negative, the possibility of a false negative result should be considered in the context of a patient's recent exposures and the presence of clinical signs and symptoms consistent with COVID-19. An individual without symptoms of COVID-19 and who is not shedding SARS-CoV-2 virus would expect to have a negative (not detected) result in this assay.  Performed  At: [facility] [place], [place] [id number] Rush Farmer MD ZY:[id number]    [place]  Final    Comment: Performed at [facility] [address], [place], [place] [address]         Radiology Studies: No results found.      Scheduled Meds:  calcium carbonate  500 mg of elemental calcium Oral BID   And   cholecalciferol  200 Units Oral BID   colestipol  1 g Oral BID   escitalopram  10 mg Oral Daily   feeding supplement  1 Container Oral BID BM   feeding supplement (ENSURE ENLIVE)  237 mL Oral Q24H   feeding supplement (PRO-STAT SUGAR FREE 64)  30 mL Oral BID   lactulose  10 g Oral TID   levothyroxine  88 mcg Oral Q0600   lipase/protease/amylase  36,000 Units Oral TID AC   magnesium oxide  400 mg Oral BID   multivitamin with minerals  1 tablet Oral Daily   sodium bicarbonate  650 mg Oral BID   Continuous Infusions:   LOS: 11 days    Time spent: 35 minutes spent in the coordination of care today.    Jonnie Finner, DO [facility] Pager [phone]  If 7PM-7AM, please contact night-coverage [email or web address] Password [facility] 01/18/2019, 3:42 PM

## 2019-01-19 LAB — GLUCOSE, CAPILLARY
Glucose-Capillary: 106 mg/dL — ABNORMAL HIGH (ref 70–99)
Glucose-Capillary: 80 mg/dL (ref 70–99)
Glucose-Capillary: 85 mg/dL (ref 70–99)

## 2019-01-19 MED ORDER — FUROSEMIDE 10 MG/ML IJ SOLN
40.0000 mg | Freq: Two times a day (BID) | INTRAMUSCULAR | Status: DC
  Administered 2019-01-19 – 2019-01-20 (×2): 40 mg via INTRAVENOUS
  Filled 2019-01-19 (×2): qty 4

## 2019-01-19 NOTE — Progress Notes (Signed)
Physical Therapy Treatment Patient Details Name: Kaylee Rasmussen MRN: [id number] DOB: 1950-12-27 Today's Date: 01/19/2019    History of Present Illness 68 yo female admitted to ED 7/16 with rectal bleeding, CT abdomen pelvis revealing ? liver cirrhosis, ascites, small pleural effusions. Colonoscopy 7/19 reveals internal and external hemorrhoids, diverticulosis. 7/22 paracentesis yielding 4.5L clear yellow fluid. PMH includes HTN, HLD, asthma, RA, necrotizing glomerulonephritis/vasculitis, pancytopenia, chronic diarrhea with N/V.    PT Comments    Pt more Sprite and "feeling better".  Assisted OOB to [facility] then amb in hallway.  General bed mobility comments: required increased assist with increased c/o ABD size, cramping.  great difficulty self performing due to ABD girth. General transfer comment: assisted from elevated bed to BCS with 50% VC's on proper hand transfer and safety with turns.  General Gait Details: Assisted with amb + 2 for safety due to weakness and unsteadiness.  HIGH FALL RISK.  Limited distance due to fatigue and extended hospital stay.   Follow Up Recommendations  SNF     Equipment Recommendations  None recommended by PT    Recommendations for Other Services       Precautions / Restrictions Precautions Precautions: Fall Restrictions Weight Bearing Restrictions: No    Mobility  Bed Mobility Overal bed mobility: Needs Assistance Bed Mobility: Supine to Sit     Supine to sit: Mod assist     General bed mobility comments: required increased assist with increased c/o ABD size, cramping.  great difficulty self performing due to ABD girth.  Transfers Overall transfer level: Needs assistance Equipment used: 1 person hand held assist Transfers: Sit to/from [facility] Sit to Stand: Min assist Stand pivot transfers: Mod assist       General transfer comment: assisted from elevated bed to BCS with 50% VC's on proper hand transfer and safety with  turns  Ambulation/Gait Ambulation/Gait assistance: Min assist;Mod assist;+2 safety/equipment Gait Distance (Feet): 35 Feet Assistive device: Rolling walker (2 wheeled) Gait Pattern/deviations: Step-to pattern;Step-through pattern Gait velocity: decreased   General Gait Details: Assisted with amb + 2 for safety due to weakness and unsteadiness.  HIGH FALL RISK.  Limited distance due to fatigue and extended hospital stay.   Stairs             Wheelchair Mobility    Modified Rankin (Stroke Patients Only)       Balance                                            Cognition Arousal/Alertness: Awake/alert Behavior During Therapy: WFL for tasks assessed/performed;Flat affect Overall Cognitive Status: Within Functional Limits for tasks assessed                                 General Comments: pleasant and feeling "better"      Exercises      General Comments        Pertinent Vitals/Pain Pain Assessment: Faces Faces Pain Scale: Hurts a little bit Pain Location: abdomen Pain Descriptors / Indicators: Tightness Pain Intervention(s): Monitored during session    Home Living                      Prior Function            PT Goals (current goals can now  be found in the care plan section) Progress towards PT goals: Progressing toward goals    Frequency    Min 2X/week      PT Plan Current plan remains appropriate    Co-evaluation              AM-PAC PT "6 Clicks" Mobility   Outcome Measure  Help needed turning from your back to your side while in a flat bed without using bedrails?: A Lot Help needed moving from lying on your back to sitting on the side of a flat bed without using bedrails?: A Lot Help needed moving to and from a bed to a chair (including a wheelchair)?: A Lot Help needed standing up from a chair using your arms (e.g., wheelchair or bedside chair)?: A Lot Help needed to walk in hospital  room?: A Lot Help needed climbing 3-5 steps with a railing? : Total 6 Click Score: 11    End of Session Equipment Utilized During Treatment: Gait belt Activity Tolerance: Patient limited by fatigue;Patient limited by pain Patient left: in chair;with chair alarm set;with call bell/phone within reach Nurse Communication: Mobility status PT Visit Diagnosis: Other abnormalities of gait and mobility (R26.89);Muscle weakness (generalized) (M62.81);History of falling (Z91.81)     Time: 0600-4599 PT Time Calculation (min) (ACUTE ONLY): 27 min  Charges:  $Gait Training: 8-22 mins $Therapeutic Activity: 8-22 mins                     Rica Koyanagi  PTA Acute  Rehabilitation Services Pager      [phone] Office      [phone]

## 2019-01-19 NOTE — Progress Notes (Signed)
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  She was admitted for blood in stool and chronic diarrhea and abnormal liver function. She's now s/p colonoscopy with GI,  Assessment & Plan:   Principal Problem:   ARF (acute renal failure) (HCC) Active Problems:   Rheumatoid arthritis involving multiple sites with positive rheumatoid factor (HCC)   History of hypertension   History of hypothyroidism   Abnormal liver function   Diarrhea   Pulmonary nodule   Metabolic acidosis   Severe protein-calorie malnutrition (HCC)   Rectal bleeding   LFTs abnormal   Hepatic cirrhosis (HCC)   Pancreatic insufficiency   Ascites  Blood in stool - GI was consulted, appreciate recommendations - Colonoscopy was performed with findings of localized inflammation at ileocecal valve, external and internal hemorrhoids - see report - Recurrent/persistent CMV  -CMV negative - resolved and stable  Abnormal liver function  - GGT (elevated) - Check ferritin (elevated), iron (wnl), tibc (not calc), ceruloplasmin (wnl), alpha 1 antitripsin (wnl), ana (negative), antismooth muscle ab (wnl), check anti mitochondrial antibody (wnl) - Check MRCP ->with severe diffuse hepatic steatosis (can't exclude cirrhosis) - solitary subcentimeter R liver lesion, likely benign cyst ->recommend f/u MRI with and without IV contrast in 3-6 months - ? 2/2 methotrexate [place] vs fatty liver vs other per GI - Recommend outpatient;  has appt set up w/ GI  Chronic diarrhea - gliadin antibodies - wnl, TTG - normal, - Cholestyramine prn - Trial of creon  - Appreciate GI recs - resolved with colestid BID, imodium, and creon  Community Acquired Pneumonia  Fever:  - Bld Cx Neg - CXR with patchy airspace dz in L lung base (early pneumonia vs sequelae of aspiration) - Repeat 2 view CXR with small bilateral effusions and associated atelectasis vs consolidation - Completed Ceftriaxone/azithro x 5 days  AKI on CKD III - creatinine worsened in setting of soft BP's on 7/18.  - baseline creatinine appears to be ~1.5, continue to monitor. - No hydro on imaging from earlier in hospitalization -renal function stable at about 2.1  Right Greater than Left Effusions  Moderate Volume Ascites:  -s/p paracentesis w/ 4.5L removed - reports feeling much better today and wants to eat - monitor renal function (essentially unmoved from; s/p2dose albumin)    - on minimal O2 support  Hypotension/Hx of Hypertension - BP low normal, continue holding antihypertensives for now     - Stable at present  Non- AG acidosis - Improved, d/c IV bicarb - Pt on PO bicarb; acidosis resolved, stable  N/V - Zofran 4mg  iv q6h prn - improved after paracentesis  Hypothyroidism - Cont Levothyroxine 88 micrograms po qday as tolerated  Anxiety - Cont Lexapro 10mg  po qday as tolerated      -Currently stable  Normocytic Anemia - stable, no frank bleed noted, monitor    - Recheck cbc in AM   RA - Off Humira, follows with Deveshwar - Continued on Voltaren gel prn  Pulmonary Nodule - Recommend outpatient follow up  Right greater than Left LEE - RLE without DVT.  Hypoglycemia - poor PO intake; encourage  PO intake - says she feels bloated and stomach is tight; will ask IR to eval for paracentesis -  now s/p paracentesis; says that she feels hungry and wants to eat; encourage diet - continue encouraging diet -glucoses stable ON; cont to encourage PO intake  Hypomagnesemia/Hypophospatemia - replete, monitor  Debility - will ask PT to eval - PT recs SNF; spoke with patient, and she has agreed to SNF rehab - bed with [facility] - recent lethargy, however mentation currently much improved  Pitting edema - BLE edema and signs of volume overload -No echo noted. Will order -Will startt pt on IV lasix as tolerated, monitor I/o  DVT prophylaxis: SCD's Code Status: Full Family Communication: Pt in room, family not at bedside Disposition Plan: SNF when medically stable  Consultants:   GI  IR  Procedures:   Colonoscopy 7/19    Antimicrobials: Anti-infectives (From admission, onward)   Start     Dose/Rate Route Frequency Ordered Stop   01/07/19 1700  cefTRIAXone (ROCEPHIN) 2 g in sodium chloride 0.9 % 100 mL IVPB     2 g 200 mL/hr over 30 Minutes Intravenous Every 24 hours 01/07/19 1557 01/11/19 1908   01/07/19 1700  azithromycin (ZITHROMAX) 500 mg in sodium chloride 0.9 % 250 mL IVPB     500 mg 250 mL/hr over 60 Minutes Intravenous Every 24 hours 01/07/19 1557 01/11/19 2014       Subjective: Reports feeling better, however reports increased swelling  Objective: Vitals:   01/18/19 1324 01/18/19 2039 01/19/19 0445 01/19/19 1244  BP: 123/73 117/67 124/74 114/64  Pulse: 88 98 89 95  Resp: 16 18 18 18   Temp: 97.9 F (36.6 C) 98.6 F (37 C) 99.2 F (37.3 C) 98.7 F (37.1 C)  TempSrc: Oral  Oral Oral  SpO2: 100% 98% 94% 100%  Weight:   70.8 kg   Height:        Intake/Output Summary (Last 24 hours) at 01/19/2019 1627 Last data filed at 01/19/2019 1241 Gross per 24 hour  Intake 240 ml  Output -  Net 240 ml   Filed Weights   01/15/19 0700 01/18/19 0634 01/19/19 0445  Weight: 73 kg 70.2 kg 70.8 kg     Examination:  General exam: Appears calm and comfortable  Respiratory system: Clear to auscultation. Respiratory effort normal. Cardiovascular system: S1 & S2 heard, RRR Gastrointestinal system: Abdomen is nondistended, soft and nontender. No organomegaly or masses felt. Normal bowel sounds heard. Central nervous system: Alert and oriented. No focal neurological deficits. Extremities: Symmetric 5 x 5 power, BLE pitting edema Skin: No rashes, lesions Psychiatry: Judgement and insight appear normal. Mood & affect appropriate.   Data Reviewed: I have personally reviewed following labs and imaging studies  CBC: Recent Labs  Lab 01/13/19 0705 01/14/19 0533 01/15/19 1305  WBC 11.0* 11.0* 8.3  NEUTROABS 8.2* 8.1*  --   HGB 9.3* 9.5* 9.6*  HCT 29.8* 30.1* 31.4*  MCV 99.0 97.4 100.3*  PLT 135* 133* 315*   Basic Metabolic Panel: Recent Labs  Lab 01/13/19 0705 01/14/19 0533 01/15/19 1305 01/16/19 0833  NA 138 139 142 143  K 4.3 4.6 4.6 5.3*  CL 107 107 109 112*  CO2 22 21* 22 21*  GLUCOSE 87 87 80 94  BUN 31* 34* 36* 36*  CREATININE 2.22* 2.19* 2.16* 2.12*  CALCIUM 7.8* 8.0* 7.9* 8.0*  MG 1.9 2.0  --   --   PHOS 2.6 2.0* 2.3* 2.5  GFR: Estimated Creatinine Clearance: 23.2 mL/min (A) (by C-G formula based on SCr of 2.12 mg/dL (H)). Liver Function Tests: Recent Labs  Lab 01/12/19 2115 01/13/19 0705 01/14/19 0533 01/15/19 1305 01/16/19 0833  ALBUMIN 1.8* 1.8* 2.1* 2.1* 2.0*   No results for input(s): LIPASE, AMYLASE in the last 168 hours. Recent Labs  Lab 01/12/19 2115 01/16/19 0833  AMMONIA 68* 103*   Coagulation Profile: No results for input(s): INR, PROTIME in the last 168 hours. Cardiac Enzymes: No results for input(s): CKTOTAL, CKMB, CKMBINDEX, TROPONINI in the last 168 hours. BNP (last 3 results) No results for input(s): PROBNP in the last 8760 hours. HbA1C: No results for input(s): HGBA1C in the last 72 hours. CBG: Recent Labs  Lab 01/18/19 1128  01/18/19 1650 01/19/19 0750 01/19/19 1241 01/19/19 1621  GLUCAP 115* 83 106* 80 85   Lipid Profile: No results for input(s): CHOL, HDL, LDLCALC, TRIG, CHOLHDL, LDLDIRECT in the last 72 hours. Thyroid Function Tests: No results for input(s): TSH, T4TOTAL, FREET4, T3FREE, THYROIDAB in the last 72 hours. Anemia Panel: No results for input(s): VITAMINB12, FOLATE, FERRITIN, TIBC, IRON, RETICCTPCT in the last 72 hours. Sepsis Labs: No results for input(s): PROCALCITON, LATICACIDVEN in the last 168 hours.  Recent Results (from the past 240 hour(s))  Culture, body fluid-bottle     Status: None   Collection Time: 01/12/19  4:17 PM   Specimen: Fluid  Result Value Ref Range Status   Specimen Description FLUID PERITONEAL  Final   Special Requests BOTTLES DRAWN AEROBIC AND ANAEROBIC  Final   Culture   Final    NO GROWTH 5 DAYS Performed at [facility], [address] [place], [place] [address]    Report Status 01/17/2019 FINAL  Final  Gram stain     Status: None   Collection Time: 01/12/19  4:17 PM   Specimen: Fluid  Result Value Ref Range Status   Specimen Description FLUID PERITONEAL  Final   Special Requests NONE  Final   Gram Stain   Final    MODERATE WBC PRESENT, PREDOMINANTLY MONONUCLEAR NO ORGANISMS SEEN Performed at [facility], [place] [address], [place], [place] [address]    Report Status 01/12/2019 FINAL  Final  Novel Coronavirus, NAA (hospital order; send-out to ref lab)     Status: None   Collection Time: 01/14/19  2:00 PM   Specimen: Nasopharyngeal Swab; Respiratory  Result Value Ref Range Status   SARS-CoV-2, NAA NOT DETECTED NOT DETECTED Final    Comment: (NOTE) This test was developed and its performance characteristics determined by [employer]. This test has not been FDA cleared or approved. This test has been authorized by FDA under an Emergency Use Authorization (EUA). This test is only authorized for the duration of time the declaration  that circumstances exist justifying the authorization of the emergency use of in vitro diagnostic tests for detection of SARS-CoV-2 virus and/or diagnosis of COVID-19 infection under section 564(b)(1) of the Act, 21 U.S.C. 751ZGY-1(V)(4), unless the authorization is terminated or revoked sooner. When diagnostic testing is negative, the possibility of a false negative result should be considered in the context of a patient's recent exposures and the presence of clinical signs and symptoms consistent with COVID-19. An individual without symptoms of COVID-19 and who is not shedding SARS-CoV-2 virus would expect to have a negative (not detected) result in this assay. Performed  At: [facility] [address] [place], [place] [id number] Rush Farmer MD MB:[id number]    [id number]  Source NASOPHARYNGEAL  Final    Comment: Performed at [facility], [place] [address], [place], [place] [address]  Novel Coronavirus, NAA (hospital order; send-out to ref lab)     Status: None   Collection Time: 01/17/19  7:37 PM   Specimen: Nasopharyngeal Swab; Respiratory  Result Value Ref Range Status   SARS-CoV-2, NAA NOT DETECTED NOT DETECTED Final    Comment: (NOTE) This test was developed and its performance characteristics determined by [employer]. This test has not been FDA cleared or approved. This test has been authorized by FDA under an Emergency Use Authorization (EUA). This test is only authorized for the duration of time the declaration that circumstances exist justifying the authorization of the emergency use of in vitro diagnostic tests for detection of SARS-CoV-2 virus and/or diagnosis of COVID-19 infection under section 564(b)(1) of the Act, 21 U.S.C. 294TML-4(Y)(5), unless the authorization is terminated or revoked sooner. When diagnostic testing is negative, the possibility of a false negative result should be considered in the context of a patient's  recent exposures and the presence of clinical signs and symptoms consistent with COVID-19. An individual without symptoms of COVID-19 and who is not shedding SARS-CoV-2 virus would expect to have a negative (not detected) result in this assay. Performed  At: [facility] [place], [place] [id number] Rush Farmer MD EX:[id number]    [place]  Final    Comment: Performed at [facility] [address], [place], [place] [address]     Radiology Studies: No results found.  Scheduled Meds: . calcium carbonate  500 mg of elemental calcium Oral BID   And  . cholecalciferol  200 Units Oral BID  . colestipol  1 g Oral BID  . escitalopram  10 mg Oral Daily  . feeding supplement  1 Container Oral BID BM  . feeding supplement (ENSURE ENLIVE)  237 mL Oral Q24H  . feeding supplement (PRO-STAT SUGAR FREE 64)  30 mL Oral BID  . furosemide  40 mg Intravenous Q12H  . lactulose  10 g Oral TID  . levothyroxine  88 mcg Oral Q0600  . lipase/protease/amylase  36,000 Units Oral TID AC  . magnesium oxide  400 mg Oral BID  . multivitamin with minerals  1 tablet Oral Daily  . sodium bicarbonate  650 mg Oral BID   Continuous Infusions:   LOS: 12 days   Marylu Lund, MD [facility] Pager On Amion  If 7PM-7AM, please contact night-coverage 01/19/2019, 4:27 PM

## 2019-01-20 ENCOUNTER — Inpatient Hospital Stay (HOSPITAL_COMMUNITY): Payer: PPO

## 2019-01-20 DIAGNOSIS — R630 Anorexia: Secondary | ICD-10-CM

## 2019-01-20 DIAGNOSIS — I5031 Acute diastolic (congestive) heart failure: Secondary | ICD-10-CM

## 2019-01-20 DIAGNOSIS — R1310 Dysphagia, unspecified: Secondary | ICD-10-CM

## 2019-01-20 DIAGNOSIS — R601 Generalized edema: Secondary | ICD-10-CM

## 2019-01-20 LAB — COMPREHENSIVE METABOLIC PANEL
ALT: 18 U/L (ref 0–44)
AST: 62 U/L — ABNORMAL HIGH (ref 15–41)
Albumin: 2 g/dL — ABNORMAL LOW (ref 3.5–5.0)
Alkaline Phosphatase: 231 U/L — ABNORMAL HIGH (ref 38–126)
Anion gap: 10 (ref 5–15)
BUN: 40 mg/dL — ABNORMAL HIGH (ref 8–23)
CO2: 23 mmol/L (ref 22–32)
Calcium: 8.2 mg/dL — ABNORMAL LOW (ref 8.9–10.3)
Chloride: 107 mmol/L (ref 98–111)
Creatinine, Ser: 2.63 mg/dL — ABNORMAL HIGH (ref 0.44–1.00)
GFR calc Af Amer: 21 mL/min — ABNORMAL LOW (ref >60)
GFR calc non Af Amer: 18 mL/min — ABNORMAL LOW (ref >60)
Glucose, Bld: 86 mg/dL (ref 70–99)
Potassium: 4.4 mmol/L (ref 3.5–5.1)
Sodium: 140 mmol/L (ref 135–145)
Total Bilirubin: 3.2 mg/dL — ABNORMAL HIGH (ref 0.3–1.2)
Total Protein: 5.3 g/dL — ABNORMAL LOW (ref 6.5–8.1)

## 2019-01-20 LAB — GLUCOSE, CAPILLARY
Glucose-Capillary: 83 mg/dL (ref 70–99)
Glucose-Capillary: 95 mg/dL (ref 70–99)
Glucose-Capillary: 98 mg/dL (ref 70–99)

## 2019-01-20 LAB — ECHOCARDIOGRAM COMPLETE
Height: 61 in
Weight: 2578.5 oz

## 2019-01-20 MED ORDER — PRO-STAT SUGAR FREE PO LIQD
30.0000 mL | Freq: Three times a day (TID) | ORAL | Status: DC
  Administered 2019-01-20 – 2019-01-23 (×6): 30 mL via ORAL
  Filled 2019-01-20 (×7): qty 30

## 2019-01-20 MED ORDER — PANTOPRAZOLE SODIUM 40 MG IV SOLR: 40.0000 mg | INTRAVENOUS | Status: DC

## 2019-01-20 MED ORDER — SODIUM CHLORIDE 0.45 % IV SOLN
INTRAVENOUS | Status: DC
  Administered 2019-01-20 – 2019-01-21 (×3): via INTRAVENOUS

## 2019-01-20 NOTE — Progress Notes (Signed)
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  She was admitted for blood in stool and chronic diarrhea and abnormal liver function. She's now s/p colonoscopy with GI,  Assessment & Plan:   Principal Problem:   ARF (acute renal failure) (HCC) Active Problems:   Rheumatoid arthritis involving multiple sites with positive rheumatoid factor (HCC)   History of hypertension   History of hypothyroidism   Abnormal liver function   Diarrhea   Pulmonary nodule   Metabolic acidosis   Severe protein-calorie malnutrition (HCC)   Rectal bleeding   LFTs abnormal   Hepatic cirrhosis (HCC)   Pancreatic insufficiency   Ascites  Blood in stool - GI was consulted, appreciate recommendations - Colonoscopy was performed with findings of localized inflammation at ileocecal valve, external and internal hemorrhoids - see report -CMV found to be negative - noted to have esolved and stable  Abnormal liver function  - GGT (elevated) - Check ferritin (elevated), iron (wnl), tibc (not calc), ceruloplasmin (wnl), alpha 1 antitripsin (wnl), ana (negative), antismooth muscle ab (wnl), check anti mitochondrial antibody (wnl) - Check MRCP ->with severe diffuse hepatic steatosis (can't exclude cirrhosis) - solitary subcentimeter R liver lesion, likely benign cyst ->recommend f/u MRI with and without IV contrast in 3-6 months - ? 2/2 methotrexate [place] vs fatty liver vs other per GI - GI has recommended  outpatient; has appt set up w/ GI  Chronic diarrhea - gliadin antibodies - wnl, TTG - normal, - Cholestyramine prn - Trial of creon  - Appreciate GI recs - resolved with colestid BID, imodium, and creon, cont as tolerated  Community Acquired Pneumonia  Fever:  - Bld Cx Neg - CXR with patchy airspace dz in L lung base (early pneumonia vs sequelae of aspiration) - Repeat 2 view CXR with small bilateral effusions and associated atelectasis vs consolidation - Completed Ceftriaxone/azithro x 5 days  AKI on CKD III - creatinine worsened in setting of soft BP's on 7/18.  - baseline creatinine appears to be ~1.5, continue to monitor. - No hydro on imaging from earlier in hospitalization -Cr has increased to 2.6 following trial of IV lasix. Dry membranes on exam, thus stopped further diuretic - Encourage PO hydration  Right Greater than Left Effusions  Moderate Volume Ascites:  -s/p paracentesis w/ 4.5L removed - reports feeling much better today and wants to eat - monitor renal function (essentially unmoved from; s/p2dose albumin)    - Remains on minimal O2 support  Hypotension/Hx of Hypertension - BP low normal, continue holding antihypertensives for now     - Stable currently  Non- AG acidosis - Improved, d/c IV bicarb - Pt on PO bicarb; acidosis resolved, currently stable  N/V - Zofran 4mg  iv q6h prn - improved after recent paracentesis  Hypothyroidism - Cont Levothyroxine 88 micrograms po qday as pt tolerates  Anxiety - Cont Lexapro 10mg  po qday as tolerated      -Presently stable  Normocytic Anemia - stable, no frank bleed noted, monitor    - Cont to follow CBC trends   RA - Off Humira, follows with Deveshwar - Continued on Voltaren  gel prn  Pulmonary Nodule - Recommended outpatient follow up  Right greater than Left LEE - RLE  without DVT.  Hypoglycemia - poor PO intake; encourage PO intake - says she feels bloated and stomach is tight; will ask IR to eval for paracentesis - now s/p paracentesis; says that she feels hungry and wants to eat; encourage diet - continue encouraging diet -glucoses stable; cont to encourage PO intake  Hypomagnesemia/Hypophospatemia - replete, monitor  Debility - will ask PT to eval - PT recs SNF; spoke with patient, and she has agreed to SNF rehab - bed with [facility]   Toxic metabolic encephalopathy - recent lethargy, however mentation currently much improved -appropriately conversant this AM  Pitting edema - BLE edema and signs of volume overload -2d echo reviewed, normal LVEF -Given trial of lasix with some improvement -Suspect anasarca related to marked malnutrition -given continued symptoms, have asked GI to re-evaluate  DVT prophylaxis: SCD's Code Status: Full Family Communication: Pt in room, family not at bedside Disposition Plan: SNF when medically stable  Consultants:   GI  IR  Procedures:   Colonoscopy 7/19    Antimicrobials: Anti-infectives (From admission, onward)   Start     Dose/Rate Route Frequency Ordered Stop   01/07/19 1700  cefTRIAXone (ROCEPHIN) 2 g in sodium chloride 0.9 % 100 mL IVPB     2 g 200 mL/hr over 30 Minutes Intravenous Every 24 hours 01/07/19 1557 01/11/19 1908   01/07/19 1700  azithromycin (ZITHROMAX) 500 mg in sodium chloride 0.9 % 250 mL IVPB     500 mg 250 mL/hr over 60 Minutes Intravenous Every 24 hours 01/07/19 1557 01/11/19 2014      Subjective: Reports feeling worse today  Objective: Vitals:   01/19/19 2104 01/20/19 0420 01/20/19 0609 01/20/19 1449  BP: 108/65 124/69  (!) 103/58  Pulse: 91 91  90  Resp: 18 18  20   Temp: 98.7 F (37.1 C) 98.5 F (36.9 C)  99.3 F (37.4 C)  TempSrc: Oral Oral  Oral  SpO2: 90% 92%  97%  Weight:   73.1 kg   Height:         Intake/Output Summary (Last 24 hours) at 01/20/2019 1553 Last data filed at 01/20/2019 0630 Gross per 24 hour  Intake 700 ml  Output 45 ml  Net 655 ml   Filed Weights   01/18/19 0634 01/19/19 0445 01/20/19 0609  Weight: 70.2 kg 70.8 kg 73.1 kg    Examination: General exam: Awake, laying in bed, in nad Respiratory system: Normal respiratory effort, no wheezing Cardiovascular system: regular rate, s1, s2 Gastrointestinal system: distended, obese Central nervous system: CN2-12 grossly intact, strength intact Extremities: Perfused, no clubbing, BLE edema, improved Skin: Normal skin turgor, no notable skin lesions seen Psychiatry: Mood normal // no visual hallucinations   Data Reviewed: I have personally reviewed following labs and imaging studies  CBC: Recent Labs  Lab 01/14/19 0533 01/15/19 1305  WBC 11.0* 8.3  NEUTROABS 8.1*  --   HGB 9.5* 9.6*  HCT 30.1* 31.4*  MCV 97.4 100.3*  PLT 133* 160*   Basic Metabolic Panel: Recent Labs  Lab 01/14/19 0533 01/15/19 1305 01/16/19 0833 01/20/19 0533  NA 139 142 143 140  K 4.6 4.6 5.3* 4.4  CL 107 109 112* 107  CO2 21* 22 21* 23  GLUCOSE 87 80 94 86  BUN 34* 36* 36* 40*  CREATININE 2.19* 2.16* 2.12* 2.63*  CALCIUM 8.0* 7.9* 8.0* 8.2*  MG 2.0  --   --   --  PHOS 2.0* 2.3* 2.5  --    GFR: Estimated Creatinine Clearance: 19 mL/min (A) (by C-G formula based on SCr of 2.63 mg/dL (H)). Liver Function Tests: Recent Labs  Lab 01/14/19 0533 01/15/19 1305 01/16/19 0833 01/20/19 0533  AST  --   --   --  62*  ALT  --   --   --  18  ALKPHOS  --   --   --  231*  BILITOT  --   --   --  3.2*  PROT  --   --   --  5.3*  ALBUMIN 2.1* 2.1* 2.0* 2.0*   No results for input(s): LIPASE, AMYLASE in the last 168 hours. Recent Labs  Lab 01/16/19 0833  AMMONIA 103*   Coagulation Profile: No results for input(s): INR, PROTIME in the last 168 hours. Cardiac Enzymes: No results for input(s): CKTOTAL, CKMB, CKMBINDEX,  TROPONINI in the last 168 hours. BNP (last 3 results) No results for input(s): PROBNP in the last 8760 hours. HbA1C: No results for input(s): HGBA1C in the last 72 hours. CBG: Recent Labs  Lab 01/19/19 0750 01/19/19 1241 01/19/19 1621 01/20/19 0837 01/20/19 1159  GLUCAP 106* 80 85 83 95   Lipid Profile: No results for input(s): CHOL, HDL, LDLCALC, TRIG, CHOLHDL, LDLDIRECT in the last 72 hours. Thyroid Function Tests: No results for input(s): TSH, T4TOTAL, FREET4, T3FREE, THYROIDAB in the last 72 hours. Anemia Panel: No results for input(s): VITAMINB12, FOLATE, FERRITIN, TIBC, IRON, RETICCTPCT in the last 72 hours. Sepsis Labs: No results for input(s): PROCALCITON, LATICACIDVEN in the last 168 hours.  Recent Results (from the past 240 hour(s))  Culture, body fluid-bottle     Status: None   Collection Time: 01/12/19  4:17 PM   Specimen: Fluid  Result Value Ref Range Status   Specimen Description FLUID PERITONEAL  Final   Special Requests BOTTLES DRAWN AEROBIC AND ANAEROBIC  Final   Culture   Final    NO GROWTH 5 DAYS Performed at [facility], [address], [place], [place] [address]    Report Status 01/17/2019 FINAL  Final  Gram stain     Status: None   Collection Time: 01/12/19  4:17 PM   Specimen: Fluid  Result Value Ref Range Status   Specimen Description FLUID PERITONEAL  Final   Special Requests NONE  Final   Gram Stain   Final    MODERATE WBC PRESENT, PREDOMINANTLY MONONUCLEAR NO ORGANISMS SEEN Performed at [facility], [place] [address], [place], [place] [address]    Report Status 01/12/2019 FINAL  Final  Novel Coronavirus, NAA (hospital order; send-out to ref lab)     Status: None   Collection Time: 01/14/19  2:00 PM   Specimen: Nasopharyngeal Swab; Respiratory  Result Value Ref Range Status   SARS-CoV-2, NAA NOT DETECTED NOT DETECTED Final    Comment: (NOTE) This test was developed and its performance characteristics determined by [employer]. This test has not been FDA cleared or approved. This test has been authorized by FDA under an Emergency Use Authorization (EUA). This test is only authorized for the duration of time the declaration that circumstances exist justifying the authorization of the emergency use of in vitro diagnostic tests for detection of SARS-CoV-2 virus and/or diagnosis of COVID-19 infection under section 564(b)(1) of the Act, 21 U.S.C. 937TKW-4(O)(9), unless the authorization is terminated or revoked sooner. When diagnostic testing is negative, the possibility of a false negative result should be considered in  the context of a patient's recent exposures and the presence of clinical signs and symptoms consistent with COVID-19. An individual without symptoms of COVID-19 and who is not shedding SARS-CoV-2 virus would expect to have a negative (not detected) result in this assay. Performed  At: [facility] [place], [place] [id number] Rush Farmer MD [id number]:[id number]    [place]  Final    Comment: Performed at [facility] [address], [place], [place] [address]  Novel Coronavirus, NAA (hospital order; send-out to ref lab)     Status: None   Collection Time: 01/17/19  7:37 PM   Specimen: Nasopharyngeal Swab; Respiratory  Result Value Ref Range Status   SARS-CoV-2, NAA NOT DETECTED NOT DETECTED Final    Comment: (NOTE) This test was developed and its performance characteristics determined by [employer]. This test has not been FDA cleared or approved. This test has been authorized by FDA under an Emergency Use Authorization (EUA). This test is only authorized for the duration of time the declaration that circumstances exist justifying the authorization of the emergency use of in vitro diagnostic tests for detection of SARS-CoV-2 virus and/or diagnosis of COVID-19 infection under section 564(b)(1) of the Act, 21  U.S.C. 165VVZ-4(M)(2), unless the authorization is terminated or revoked sooner. When diagnostic testing is negative, the possibility of a false negative result should be considered in the context of a patient's recent exposures and the presence of clinical signs and symptoms consistent with COVID-19. An individual without symptoms of COVID-19 and who is not shedding SARS-CoV-2 virus would expect to have a negative (not detected) result in this assay. Performed  At: [facility] [place], [place] [id number] Rush Farmer MD BE:[id number]    [place]  Final    Comment: Performed at [facility] [address] [place], [place] [address]     Radiology Studies: No results found.  Scheduled Meds: . calcium carbonate  500 mg of elemental calcium Oral BID   And  . cholecalciferol  200 Units Oral BID  . colestipol  1 g Oral BID  . escitalopram  10 mg Oral Daily  . feeding supplement (ENSURE ENLIVE)  237 mL Oral Q24H  . feeding supplement (PRO-STAT SUGAR FREE 64)  30 mL Oral TID BM  . lactulose  10 g Oral TID  . levothyroxine  88 mcg Oral Q0600  . lipase/protease/amylase  36,000 Units Oral TID AC  . magnesium oxide  400 mg Oral BID  . multivitamin with minerals  1 tablet Oral Daily  . sodium bicarbonate  650 mg Oral BID   Continuous Infusions:   LOS: 13 days   Marylu Lund, MD [facility] Pager On Amion  If 7PM-7AM, please contact night-coverage 01/20/2019, 3:53 PM

## 2019-01-20 NOTE — Progress Notes (Signed)
Pt having increased complaints of abdominal pain and urine output very decreased.  Encouraged patient multiple times to take pro stat and drink fluids but refusing except for minimal sips.  Repositioned in bed for comfort.  Notified MD. Andre Lefort

## 2019-01-20 NOTE — Progress Notes (Signed)
Nutrition Follow-up  RD working remotely.    DOCUMENTATION CODES:   Not applicable  INTERVENTION:  - will d/c Boost Breeze. - continue Ensure Enlive once/day. - will increase prostat from BID to TID. - continue to encourage PO intakes.  - provide creon 30 minutes prior to meals.    NUTRITION DIAGNOSIS:   Inadequate oral intake related to acute illness, decreased appetite as evidenced by per patient/family report, meal completion < 50%. -ongoing  GOAL:   Patient will meet greater than or equal to 90% of their needs -unmet on average  MONITOR:   PO intake, Supplement acceptance, Diet advancement, Labs, Weight trends, I & O's  ASSESSMENT:   68 y.o. female with medical history of HTN, hyperlipidemia, asthma, rheumatoid arthritis, CMV colitis, HSV esophagitis in 07/2018, h/o necrotizing glomerulonephritis/vasculitis(+ ANCA), and pancytopenia secondary to cyclophosphamide. She presented to the ED with complaints of chronic diarrhea and intermittent N/V. She noted some blood in the stool and alerted outpatient GI provider who encouraged her to present to the ED. She had an EGD/colonoscopy in 07/2018 which showed an ulcerated mass-like lesion in the ileocecal valve with satellite ulcers, no malignancy noted, did show esophagitis and gastritis. She was admitted for c/o rectal bleeding, chronic diarrhea, non-AG acidosis secondary to diarrhea, and abnormal liver function.  Weight has been trending up over the past few days and fluctuating over the past 1.5 weeks. Per RN flow sheet, patient consumed 0% of all meals on 7/27; 25% of lunch on 7/28; 50% of all meals on 7/29; 25% of breakfast this AM. Per review of orders, patient has refused all but 1 carton of boost breeze, has accepted Ensure ~20% of the time offered, and has accepted prostat ~75% of the time offered.   Per notes: - s/p colonoscopy with findings of localized inflammation at the ileocecal valve, external and internal  hemorrhoids - abnormal liver function s/p MRCP showing severe diffuse hepatic steatosis with recommendation for follow-up MRI in 3-6 months - chronic diarrhea on trial of creon - CAP s/p antibiotics - AKI on CKD - ascites s/p paracentesis on 7/22 with 4.5L removed - pulmonary nodule with recommendation for outpatient follow-up - debility  The plan is for d/c to SNF when patient is medically stable.     Labs reviewed; CBG: 83 mg/dl today, BUN: 40 mg/dl, creatinine: 2.63 mg/dl, Ca: 8.2 mg/dl, Alk Phos elevated, AST elevated, GFR: 21 ml/min. Medications reviewed; 200 units vitamin D3 BID, 10 g lactulose TID, 88 mcg oral synthroid/day, 36000 units creon TID, 400 mg mag-ox BID, daily multivitamin with minerals, 650 mg oral sodium bicarb BID.     NUTRITION - FOCUSED PHYSICAL EXAM:  unable to complete at this time.  Diet Order:   Diet Order            Diet 2 gram sodium Room service appropriate? Yes; Fluid consistency: Thin  Diet effective now              EDUCATION NEEDS:   Not appropriate for education at this time  Skin:  Skin Assessment: Reviewed RN Assessment  Last BM:  7/30  Height:   Ht Readings from Last 1 Encounters:  01/09/19 '5\' 1"'$  (1.549 m)    Weight:   Wt Readings from Last 1 Encounters:  01/20/19 73.1 kg    Ideal Body Weight:  47.7 kg  BMI:  Body mass index is 30.45 kg/m.  Estimated Nutritional Needs:   Kcal:  2000-2200 kcal  Protein:  100-115 grams  Fluid:  >/=  2.5 L/day     Jarome Matin, MS, RD, LDN, [facility] Inpatient Clinical Dietitian Pager # [phone] After hours/weekend pager # [phone]

## 2019-01-20 NOTE — Progress Notes (Signed)
IV RN unable to find vein to start an IV. Pt skin very bruised and fragile. Patients RN made aware. Will ask day shift IV RN for second assess.

## 2019-01-20 NOTE — Consult Note (Addendum)
Consultation  Referring Provider: Dr Earlie Counts [facility] Primary Care Physician:  Nicoletta [facility], MD Primary Gastroenterologist:  Dr.Gupta  Reason for Re-Consultation:  Malnutrition,anasarca hypoalbuminemia, Failure to thrive   HPI: Kaylee Rasmussen is a 68 y.o. female, known to Dr. Lyndel Safe, who was seen by the GI service earlier this admission We are asked to reevaluate regarding persistent anorexia, inability to eat, malnutrition, and anasarca.  Patient has history of hypertension, COPD, rheumatoid arthritis, asthma, prior history of necrotizing glomerulonephritis, chronic kidney disease, and was hospitalized in February 2020 in [place] with an acute illness associated with severe diarrhea.  She had EGD and colonoscopy at that time with finding of severe grade D esophagitis and path consistent with HSV which was treated.  Colonoscopy showed a ulcerated masslike lesion at the ileocecal valve and scattered satellite ulcers.  Path was positive for CMV and she completed a course of therapy.  She really has been chronically ill since then and had been having persistent diarrhea for months with 5-6 bowel movements per day.  Appetite had been very poor and she had had a significant weight loss of 30 pounds prior to this admission. Patient had been on Humira in the past for rheumatoid arthritis but had been off for about a year.  She also relates that she took methotrexate for probably 20 years for RA.  Since admission she had repeat colonoscopy with finding of localized inflammation at the ileocecal valve.  Biopsies show mildly active nonspecific colitis.  Cytology for CMV negative. Over the past week or so she has been on Colestid, Creon and Imodium and has had significant decrease in diarrhea though still says she is having about 2 loose stools per day. Over this past weekend she was placed on lactulose apparently for an elevated ammonia level.  Nurses says she has been refusing most of it. She continues  to feel very poorly and says she just has absolutely no appetite.  When she does eat anything she feels very full and uncomfortable.  She says she actually has pain in her abdomen rather generalized usually each evening after trying to eat dinner.  She remains nauseated and has been having persistent intermittent vomiting.  She also admits to a sense of dysphagia and odynophagia. She has not been on a PPI over the past week or so. She says she is not able to eat much of anything including the supplements because they make her feel worse and nauseated  CT imaging on admission showed a new finding of probable cirrhosis with ascites.  She also presented with bilateral pleural effusions. She has had persistent issues with anasarca which is a bit worse since admission. She had a large volume paracentesis done on 01/12/2019 with 4.5 L removed, cell counts not consistent with SBP, cytology showed no evidence of malignancy, mixed inflammatory changes SAAG had not been calculated.  Chronic hepatitis serologies, autoimmune markers and hereditary markers all negative. She does have a persistent elevated bilirubin currently at 3.2 and elevated alk phos at 231. She has not been on any chronic diuretics since admission but did receive IV Lasix yesterday, and had a subsequent bump in her creatinine      Past Medical History:  Diagnosis Date  . Asthma   . COPD (chronic obstructive pulmonary disease) ([place]) 07/13/2018   on CT chest at [facility]  . Esophagitis, CMV (cytomegalovirus) ([place]) 07/08/2018  . Herpes simplex esophagitis 07/08/2018  . High cholesterol   . Hypertension   . Pancytopenia ([place])  secondary to cyclophosphamide  . Rheumatoid arthritis ([place])   . Severe protein-calorie malnutrition ([place]) 01/08/2019  . Vertigo     Past Surgical History:  Procedure Laterality Date  . ABDOMINAL HYSTERECTOMY    . BIOPSY  01/09/2019   Procedure: BIOPSY;  Surgeon: Gatha Mayer, MD;  Location: [facility];   Service: Endoscopy;;  . COLONOSCOPY WITH PROPOFOL N/A 01/09/2019   Procedure: COLONOSCOPY WITH PROPOFOL;  Surgeon: Gatha Mayer, MD;  Location: [facility];  Service: Endoscopy;  Laterality: N/A;  . HIP ARTHROPLASTY    . Lumbarectomy    . RENAL BIOPSY  03/24/2018    Prior to Admission medications   Medication Sig Start Date End Date Taking? Authorizing Provider  amLODipine (NORVASC) 5 MG tablet Take 2.5 mg by mouth at bedtime.   Yes [provider]  Calcium Carbonate-Vitamin D (CALCIUM 600+D PO) Take 1 tablet by mouth 2 (two) times daily.   Yes [provider]  escitalopram (LEXAPRO) 10 MG tablet Take 10 mg by mouth daily. 03/23/17  Yes [provider]  levothyroxine (SYNTHROID, LEVOTHROID) 88 MCG tablet Take 88 mcg by mouth daily before breakfast.  03/12/17  Yes [provider]  metoprolol succinate (TOPROL-XL) 100 MG 24 hr tablet Take 100 mg by mouth daily. 03/24/17  Yes [provider]  potassium chloride (MICRO-K) 10 MEQ CR capsule Take 10 mEq by mouth daily.   Yes [provider]  PROAIR HFA 108 (90 Base) MCG/ACT inhaler Inhale 1-2 puffs into the lungs every 4 (four) hours as needed for wheezing.  05/05/16  Yes [provider]  SODIUM BICARBONATE PO Take by mouth 2 (two) times daily. 10 gains twice  daily   Yes [provider]  colestipol (COLESTID) 1 g tablet Take 1 tablet (1 g total) by mouth 2 (two) times daily. 01/18/19 02/17/19  Cherylann Ratel A, DO  diclofenac sodium (VOLTAREN) 1 % GEL Apply 4 g topically 3 (three) times daily as needed for up to 7 days (arthritis pain). 01/18/19 01/25/19  Cherylann Ratel A, DO  dicyclomine (BENTYL) 10 MG capsule Take 1 capsule (10 mg total) by mouth 2 (two) times a day. Patient not taking: Reported on 01/06/2019 12/17/18   Jackquline Denmark, MD  lactulose (CHRONULAC) 10 GM/15ML solution Take 15 mLs (10 g total) by mouth 3 (three) times daily. 01/18/19 02/17/19  Cherylann Ratel A, DO   lipase/protease/amylase (CREON) 36000 UNITS CPEP capsule Take 1 capsule (36,000 Units total) by mouth 3 (three) times daily before meals. 01/18/19 03/19/19  Cherylann Ratel A, DO  magnesium oxide (MAG-OX) 400 (241.3 Mg) MG tablet Take 1 tablet (400 mg total) by mouth 2 (two) times daily. 01/18/19 02/17/19  Cherylann Ratel A, DO  metoCLOPramide (REGLAN) 5 MG tablet Take 1 tablet (5 mg total) by mouth 2 (two) times a day for 14 days. half an hour before meals x 2 weeks for now 01/05/19 01/19/19  Jackquline Denmark, MD  ondansetron (ZOFRAN ODT) 4 MG disintegrating tablet Take 1 tablet (4 mg total) by mouth every 6 (six) hours as needed for nausea or vomiting. Patient not taking: Reported on 11/17/2018 10/28/18   Jackquline Denmark, MD  oxyCODONE (OXY IR/ROXICODONE) 5 MG immediate release tablet Take 1 tablet (5 mg total) by mouth every 4 (four) hours as needed for up to 3 days for severe pain. 01/18/19 01/21/19  Cherylann Ratel A, DO  prochlorperazine (COMPAZINE) 10 MG tablet Take 1 tablet (10 mg total) by mouth 2 (two) times a day. Patient not  taking: Reported on 01/06/2019 12/15/18   Jackquline Denmark, MD  sodium bicarbonate 650 MG tablet Take 1 tablet (650 mg total) by mouth 2 (two) times daily. 01/18/19 02/17/19  Cherylann Ratel A, DO    Current Facility-Administered Medications  Medication Dose Route Frequency Provider Last Rate Last Dose  . acetaminophen (TYLENOL) tablet 500 mg  500 mg Oral Q6H PRN Gatha Mayer, MD   500 mg at 01/07/19 1819  . albuterol (PROVENTIL) (2.5 MG/3ML) 0.083% nebulizer solution 2.5 mg  2.5 mg Nebulization Q4H PRN Gatha Mayer, MD      . calcium carbonate (TUMS - dosed in mg elemental calcium) chewable tablet 500 mg of elemental calcium  500 mg of elemental calcium Oral BID Gatha Mayer, MD   500 mg of elemental calcium at 01/20/19 0913   And  . cholecalciferol (VITAMIN D3) tablet 200 Units  200 Units Oral BID Gatha Mayer, MD   200 Units at 01/19/19 2232  . colestipol (COLESTID) tablet 1 g  1 g  Oral BID Willia Craze, NP   1 g at 01/20/19 7673  . diclofenac sodium (VOLTAREN) 1 % transdermal gel 4 g  4 g Topical TID PRN Gatha Mayer, MD      . escitalopram (LEXAPRO) tablet 10 mg  10 mg Oral Daily Gatha Mayer, MD   10 mg at 01/20/19 0914  . feeding supplement (ENSURE ENLIVE) (ENSURE ENLIVE) liquid 237 mL  237 mL Oral Q24H Elodia Florence., MD   237 mL at 01/13/19 1356  . feeding supplement (PRO-STAT SUGAR FREE 64) liquid 30 mL  30 mL Oral TID BM Donne Hazel, MD      . lactulose (CHRONULAC) 10 GM/15ML solution 10 g  10 g Oral TID Cherylann Ratel A, DO   10 g at 01/20/19 0915  . levothyroxine (SYNTHROID) tablet 88 mcg  88 mcg Oral Q0600 Gatha Mayer, MD   88 mcg at 01/20/19 4193  . lipase/protease/amylase (CREON) capsule 36,000 Units  36,000 Units Oral TID AC Willia Craze, NP   36,000 Units at 01/20/19 [phone]  . magnesium oxide (MAG-OX) tablet 400 mg  400 mg Oral BID Marylyn Ishihara, Tyrone A, DO   400 mg at 01/20/19 4097  . morphine 2 MG/ML injection 1 mg  1 mg Intravenous Q4H PRN Gatha Mayer, MD   1 mg at 01/17/19 2309  . multivitamin with minerals tablet 1 tablet  1 tablet Oral Daily Elodia Florence., MD   1 tablet at 01/20/19 [phone]  . ondansetron (ZOFRAN) injection 4 mg  4 mg Intravenous Q6H PRN Willia Craze, NP   4 mg at 01/20/19 1253  . ondansetron (ZOFRAN-ODT) disintegrating tablet 4 mg  4 mg Oral Q8H PRN Marylyn Ishihara, Tyrone A, DO   4 mg at 01/16/19 1050  . oxyCODONE (Oxy IR/ROXICODONE) immediate release tablet 5 mg  5 mg Oral Q4H PRN Gatha Mayer, MD   5 mg at 01/19/19 1859  . sodium bicarbonate tablet 650 mg  650 mg Oral BID Gatha Mayer, MD   650 mg at 01/20/19 9924  . sodium chloride flush (NS) 0.9 % injection 10-40 mL  10-40 mL Intracatheter PRN Elodia Florence., MD   10 mL at 01/14/19 0645    Allergies as of 01/06/2019 - Review Complete 01/06/2019  Allergen Reaction Noted  . Cozaar [losartan potassium]  11/17/2018  . Scallops [shellfish allergy]  Rash 05/26/2016    Family History  Problem Relation Age of Onset  . Diabetes Mother   . Heart attack Mother   . Heart attack Father   . Breast cancer Maternal Aunt   . Colon cancer Neg Hx   . Esophageal cancer Neg Hx     Social History   Socioeconomic History  . Marital status: Married    Spouse name: Not on file  . Number of children: Not on file  . Years of education: Not on file  . Highest education level: Not on file  Occupational History  . Not on file  Social Needs  . Financial resource strain: Not hard at all  . Food insecurity    Worry: Never true    Inability: Never true  . Transportation needs    Medical: No    Non-medical: No  Tobacco Use  . Smoking status: Never Smoker  . Smokeless tobacco: Never Used  Substance and Sexual Activity  . Alcohol use: No  . Drug use: No  . Sexual activity: Not on file  Lifestyle  . Physical activity    Days per week: 0 days    Minutes per session: 0 min  . Stress: Not at all  Relationships  . Social Herbalist on phone: Three times a week    Gets together: Never    Attends religious service: Never    Active member of club or organization: No    Attends meetings of clubs or organizations: Never    Relationship status: Not on file  . Intimate partner violence    Fear of current or ex partner: Not on file    Emotionally abused: Not on file    Physically abused: Not on file    Forced sexual activity: Not on file  Other Topics Concern  . Not on file  Social History Narrative   Married   Lives in [place]   Never smoker, no EtoH/drugs    Review of Systems: Pertinent positive and negative review of systems were noted in the above HPI section.  All other review of systems was otherwise negative.  Physical Exam: Vital signs in last 24 hours: Temp:  [98.5 F (36.9 C)-99.3 F (37.4 C)] 99.3 F (37.4 C) (07/30 1449) Pulse Rate:  [90-91] 90 (07/30 1449) Resp:  [18-20] 20 (07/30 1449) BP:  (103-124)/(58-69) 103/58 (07/30 1449) SpO2:  [90 %-97 %] 97 % (07/30 1449) Weight:  [73.1 kg] 73.1 kg (07/30 0609) Last BM Date: 01/20/19 General:   Alert,  Well-developed, chronically ill-appearing older white female pleasant and cooperative in NAD Head:  Normocephalic and atraumatic. Eyes:  Sclera clear, no icterus.   Conjunctiva pink. Ears:  Normal auditory acuity. Nose:  No deformity, discharge,  or lesions. Mouth:  No deformity or lesions.   Neck:  Supple; no masses or thyromegaly. Lungs:  Clear throughout to auscultation decreased breath sounds at the bases   no wheezes, crackles, or rhonchi.  Heart:  Regular rate and rhythm; no murmurs, clicks, rubs,  or gallops. Abdomen:  Soft, non-tense ascites nontender, BS active,nonpalp mass or hsm.  She also has pitting edema in the soft tissues of the lower abdominal wall and into the flanks Rectal:  Deferred  Msk:  Symmetrical without gross deformities. . Pulses:  Normal pulses noted. Extremities: 2+ edema bilateral lower extremities to the shins Neurologic:  Alert and  oriented x4;  grossly normal neurologically. Skin:  Intact without significant lesions or rashes.. Psych:  Alert and cooperative. Normal mood and affect.  Intake/Output  from previous day: 07/29 0701 - 07/30 0700 In: 60 [P.O.:580] Out: 45 [Urine:45] Intake/Output this shift: Total I/O In: 360 [P.O.:360] Out: -   Lab Results: No results for input(s): WBC, HGB, HCT, PLT in the last 72 hours. BMET Recent Labs    01/20/19 0533  NA 140  K 4.4  CL 107  CO2 23  GLUCOSE 86  BUN 40*  CREATININE 2.63*  CALCIUM 8.2*   LFT Recent Labs    01/20/19 0533  PROT 5.3*  ALBUMIN 2.0*  AST 62*  ALT 18  ALKPHOS 231*  BILITOT 3.2*      IMPRESSION:  #81 68 year old white female with multiple comorbidities including rheumatoid arthritis, chronic kidney disease, prior history of necrotizing glomerular nephritis disease( drug-induced), hypertension, COPD, who was  admitted 2 weeks ago with 3 to 54-monthhistory of persistent diarrhea, weight loss and abdominal discomfort. She had been diagnosed with CMV colitis in [facility] 2020 [facility]  Diarrhea had persisted.  Colonoscopy this admission showed mild localized inflammation at the ileocecal valve.  Biopsies show mildly active nonspecific colitis, cannot rule out IBD.  Cytology for CMV negative Thus far she has been managed with antidiarrheals during this admission.  #2 persistent failure to thrive with severe protein calorie malnutrition.  Patient is unable to eat or drink much of anything at this point losing supplements. She is complaining of persistent nausea, intermittent vomiting and mild dysphagia and odynophagia. We will need to rule out esophagitis, stricture, gastropathy, gastroparesis.  She also needs nutritional support at this time and will need to start enteral feedings  #3 anasarca-with pleural effusions, peripheral edema, ascites Unclear if this is secondary to hypoalbuminemia from severe malnutrition and or in combination with decompensated cirrhosis  Echo today with normal EF right ventricle not well visualized  #4 chronic kidney disease with acute kidney injury #5 probable underlying cirrhosis per imaging, of unclear etiology Autoimmune markers, chronic hepatitis serologies and hereditary markers all negative Interesting that she has persistent elevated T bili and alk phos She had been on methotrexate for many years. I think she is going to need a liver biopsy to sort this out and help guide management #6 chronic anemia #7.  Rheumatoid arthritis  #8 community-acquired pneumonia on admission resolved   PLAN: Add Protonix 40 mg IV daily Gust upper endoscopy with the patient, and she is agreeable to proceed.  I reviewed indications risks and benefits.  She will be scheduled with Dr. MRush Landmarkfor tomorrow. Hopefully core tract feeding tube can be  placed tomorrow post endoscopy, and initiate enteral tube feeds she is agreeable to  Discontinue lactulose Nursing reports urine output poor today-will add half-normal saline at 50 cc/h Check urine sodium Will discuss the nonspecific colitis, may need to consider starting budesonide trial I briefly discussed liver biopsy with the patient, this may need to be done while she is inpatient. May also be prudent to do a diagnostic tap so sac can be calculated.  Plan is for patient to go to skilled nursing facility for rehab on discharge. We will follow along with you   Jonet Mathies PA-C 01/20/2019, 4:22 PM

## 2019-01-20 NOTE — Progress Notes (Signed)
  Echocardiogram 2D Echocardiogram has been performed.  Darlina Sicilian M 01/20/2019, 8:35 AM

## 2019-01-21 ENCOUNTER — Encounter (HOSPITAL_COMMUNITY): Payer: Self-pay | Admitting: Gastroenterology

## 2019-01-21 ENCOUNTER — Inpatient Hospital Stay: Payer: Self-pay

## 2019-01-21 ENCOUNTER — Inpatient Hospital Stay (HOSPITAL_COMMUNITY): Payer: PPO | Admitting: Certified Registered Nurse Anesthetist

## 2019-01-21 ENCOUNTER — Encounter (HOSPITAL_COMMUNITY)
Admission: EM | Disposition: A | Discharge status: Hospice | Payer: Self-pay | Source: Home / Self Care | Attending: Internal Medicine

## 2019-01-21 ENCOUNTER — Inpatient Hospital Stay (HOSPITAL_COMMUNITY): Payer: PPO

## 2019-01-21 DIAGNOSIS — K766 Portal hypertension: Secondary | ICD-10-CM

## 2019-01-21 DIAGNOSIS — K209 Esophagitis, unspecified: Secondary | ICD-10-CM

## 2019-01-21 DIAGNOSIS — K269 Duodenal ulcer, unspecified as acute or chronic, without hemorrhage or perforation: Secondary | ICD-10-CM

## 2019-01-21 HISTORY — PX: HEMOSTASIS CLIP PLACEMENT: SHX6857

## 2019-01-21 HISTORY — PX: ESOPHAGOGASTRODUODENOSCOPY (EGD) WITH PROPOFOL: SHX5813

## 2019-01-21 HISTORY — PX: BIOPSY: SHX5522

## 2019-01-21 LAB — COMPREHENSIVE METABOLIC PANEL
ALT: 13 U/L (ref 0–44)
AST: 57 U/L — ABNORMAL HIGH (ref 15–41)
Albumin: 1.7 g/dL — ABNORMAL LOW (ref 3.5–5.0)
Alkaline Phosphatase: 191 U/L — ABNORMAL HIGH (ref 38–126)
Anion gap: 7 (ref 5–15)
BUN: 46 mg/dL — ABNORMAL HIGH (ref 8–23)
CO2: 25 mmol/L (ref 22–32)
Calcium: 8.1 mg/dL — ABNORMAL LOW (ref 8.9–10.3)
Chloride: 106 mmol/L (ref 98–111)
Creatinine, Ser: 2.73 mg/dL — ABNORMAL HIGH (ref 0.44–1.00)
GFR calc Af Amer: 20 mL/min — ABNORMAL LOW (ref >60)
GFR calc non Af Amer: 17 mL/min — ABNORMAL LOW (ref >60)
Glucose, Bld: 84 mg/dL (ref 70–99)
Potassium: 4.5 mmol/L (ref 3.5–5.1)
Sodium: 138 mmol/L (ref 135–145)
Total Bilirubin: 3.2 mg/dL — ABNORMAL HIGH (ref 0.3–1.2)
Total Protein: 4.8 g/dL — ABNORMAL LOW (ref 6.5–8.1)

## 2019-01-21 LAB — PROTIME-INR
INR: 1.2 (ref 0.8–1.2)
Prothrombin Time: 15 seconds (ref 11.4–15.2)

## 2019-01-21 LAB — GLUCOSE, CAPILLARY
Glucose-Capillary: 75 mg/dL (ref 70–99)
Glucose-Capillary: 81 mg/dL (ref 70–99)
Glucose-Capillary: 67 mg/dL — ABNORMAL LOW (ref 70–99)
Glucose-Capillary: 109 mg/dL — ABNORMAL HIGH (ref 70–99)

## 2019-01-21 SURGERY — ESOPHAGOGASTRODUODENOSCOPY (EGD) WITH PROPOFOL
Anesthesia: Monitor Anesthesia Care

## 2019-01-21 MED ORDER — FENTANYL CITRATE (PF) 100 MCG/2ML IJ SOLN: INTRAMUSCULAR | Status: DC | PRN

## 2019-01-21 MED ORDER — PROPOFOL 500 MG/50ML IV EMUL
INTRAVENOUS | Status: DC | PRN
  Administered 2019-01-21: 150 ug/kg/min via INTRAVENOUS

## 2019-01-21 MED ORDER — PANTOPRAZOLE SODIUM 40 MG PO TBEC
40.0000 mg | DELAYED_RELEASE_TABLET | Freq: Two times a day (BID) | ORAL | Status: DC
  Administered 2019-01-21 – 2019-02-07 (×32): 40 mg via ORAL
  Filled 2019-01-21 (×34): qty 1

## 2019-01-21 MED ORDER — DEXTROSE 50 % IV SOLN
12.5000 g | INTRAVENOUS | Status: AC
  Administered 2019-01-21: 12.5 g via INTRAVENOUS

## 2019-01-21 MED ORDER — LIDOCAINE HCL (CARDIAC) PF 100 MG/5ML IV SOSY
PREFILLED_SYRINGE | INTRAVENOUS | Status: DC | PRN
  Administered 2019-01-21 (×2): 50 mg via INTRATRACHEAL

## 2019-01-21 MED ORDER — PROPOFOL 500 MG/50ML IV EMUL
INTRAVENOUS | Status: DC | PRN
  Administered 2019-01-21: 20 mg via INTRAVENOUS
  Administered 2019-01-21: 30 mg via INTRAVENOUS

## 2019-01-21 MED ORDER — SODIUM CHLORIDE 0.9% FLUSH
10.0000 mL | INTRAVENOUS | Status: DC | PRN
  Administered 2019-02-09: 10 mL
  Filled 2019-01-21: qty 40

## 2019-01-21 MED ORDER — BUDESONIDE 3 MG PO CPEP
9.0000 mg | ORAL_CAPSULE | Freq: Every day | ORAL | Status: DC
  Administered 2019-01-21 – 2019-02-06 (×15): 9 mg via ORAL
  Filled 2019-01-21 (×21): qty 3

## 2019-01-21 MED ORDER — LOPERAMIDE HCL 2 MG PO CAPS: 4.0000 mg | ORAL_CAPSULE | ORAL | Status: DC | PRN

## 2019-01-21 MED ORDER — DEXTROSE-NACL 5-0.9 % IV SOLN
INTRAVENOUS | Status: DC
  Administered 2019-01-21 – 2019-01-24 (×4): via INTRAVENOUS

## 2019-01-21 MED ORDER — DEXTROSE 50 % IV SOLN
INTRAVENOUS | Status: AC
  Filled 2019-01-21: qty 50

## 2019-01-21 MED ORDER — PHENYLEPHRINE HCL (PRESSORS) 10 MG/ML IV SOLN
INTRAVENOUS | Status: DC | PRN
  Administered 2019-01-21 (×2): 120 ug via INTRAVENOUS

## 2019-01-21 MED ORDER — PROPOFOL 10 MG/ML IV BOLUS
INTRAVENOUS | Status: AC
  Filled 2019-01-21: qty 80

## 2019-01-21 MED ORDER — ONDANSETRON HCL 4 MG/2ML IJ SOLN
4.0000 mg | Freq: Three times a day (TID) | INTRAMUSCULAR | Status: DC
  Administered 2019-01-21 – 2019-02-10 (×62): 4 mg via INTRAVENOUS
  Filled 2019-01-21 (×62): qty 2

## 2019-01-21 SURGICAL SUPPLY — 15 items

## 2019-01-21 NOTE — TOC Progression Note (Signed)
Transition of Care [facility]) - Progression Note    Patient Details  Name: Kaylee Rasmussen MRN: [id number] Date of Birth: [birth year]  Transition of Care [facility]) CM/SW [place], LCSW Phone Number: 01/21/2019, 11:17 AM  Clinical Narrative:   CSW following for support and discharge needs. Patient not medically stable for discharge per medical team. CSW made facility aware     Expected Discharge Plan: [place] Barriers to Discharge: Continued Medical Work up  Expected Discharge Plan and Services Expected Discharge Plan: [place]   Discharge Planning Services: CM Consult Post Acute Care Choice: [place] arrangements for the past 2 months: Single Family Home Expected Discharge Date: 01/08/19                                     Social Determinants of Health (SDOH) Interventions    Readmission Risk Interventions No flowsheet data found.

## 2019-01-21 NOTE — Progress Notes (Addendum)
Patient ID: Kaylee Rasmussen, female   DOB: 06/17/1951, 68 y.o.   MRN: [id number]    Progress Note   Subjective   Day # 15 Feeling about the same, remains nauseated and anorexic INR 1.2 Urine Na- pend Creat 2.7 trending up     Objective   Vital signs in last 24 hours: Temp:  [98.7 F (37.1 C)-99.3 F (37.4 C)] 98.7 F (37.1 C) (07/31 0506) Pulse Rate:  [90-94] 94 (07/31 0506) Resp:  [18-20] 18 (07/31 0506) BP: (103-123)/(58-73) 119/61 (07/31 0506) SpO2:  [93 %-100 %] 93 % (07/31 0506) Weight:  [73 kg] 73 kg (07/31 0551) Last BM Date: 01/19/29 General: Older white female in NAD Heart:  Regular rate and rhythm; no murmurs Lungs: Respirations even and unlabored, lungs CTA bilaterally Abdomen:  Soft, nontender, non-tense ascites normal bowel sounds.,  Pitting edema along lower abdominal wall Extremities: 1-2+ pitting edema lower extremities Neurologic:  Alert and oriented,  grossly normal neurologically. Psych:  Cooperative. Normal mood and affect.  Intake/Output from previous day: 07/30 0701 - 07/31 0700 In: 581.2 [P.O.:480; I.V.:101.2] Out: 20 [Urine:20] Intake/Output this shift: No intake/output data recorded.  Lab Results: No results for input(s): WBC, HGB, HCT, PLT in the last 72 hours. BMET Recent Labs    01/20/19 0533 01/21/19 0517  NA 140 138  K 4.4 4.5  CL 107 106  CO2 23 25  GLUCOSE 86 84  BUN 40* 46*  CREATININE 2.63* 2.73*  CALCIUM 8.2* 8.1*   LFT Recent Labs    01/21/19 0517  PROT 4.8*  ALBUMIN 1.7*  AST 57*  ALT 13  ALKPHOS 191*  BILITOT 3.2*   PT/INR Recent Labs    01/21/19 0517  LABPROT 15.0  INR 1.2        Assessment / Plan:    #75 68 year old white female with multiple comorbidities and complicated picture including history of rheumatoid arthritis, chronic kidney disease, hypertension and COPD who was admitted 2 weeks ago after 3 to 59-monthhistory of persistent diarrhea abdominal discomfort and weight loss. She had been  diagn[place] with [place] colitis [place]  Also had severe esophagitis positive for HSV February 2020 and treated.  Repeat colonoscopy this admission showed some mild localized inflammation at the ileocecal valve-biopsies show a mildly active nonspecific colitis cannot rule out IBD, CMV is negative. She has been on several antidiarrheals including Colestid, Creon and Imodium.  I think we should start a trial of mesalamine, or budesonide/Uceris which may be quicker acting Have ordered budesonide 9 mg p.o. daily Entocort is on formulary here May need to switch to Ulceris on discharge Stop Creon Stop Colestid for now, wonder if contributing to nausea etc.  Would also like to have better assessment of diarrhea volume without Colestid. Have ordered Imodium 4 mg every 6 hours PRN for diarrhea  #2 persistent failure to thrive and severe protein calorie malnutrition.  She has been anorexic for several months, despite antiemetics etc. here is still unable to eat Plan is to place Cor track tube later today and start enteral feedings  #3 persistent nausea and intermittent vomiting, mild dysphasia and odynophagia Started IV Protonix yesterday Patient is scheduled for EGD this afternoon  will change Zofran to around-the-clock dosing  #4 anasarca-with pleural effusions, peripheral edema and ascites Unclear whether this is secondary to hypoalbuminemia from malnutrition or possibly in combination with underlying liver disease NASH cirrhosis which is newly documented Etiology of underlying liver disease is unclear-markers have all  been negative However she does have elevated T bili and alk phos  -query PBC, versus possibly drug-induced liver disease with previous prolonged use of methotrexate May eventually need liver biopsy  #5 acute kidney injury on chronic kidney disease Diuretics have stopped Added gentle fluids at 50 cc an hour  #6 chronic anemia #7.  Rheumatoid arthritis  #8.  Community-acquired pneumonia this admission, resolved         Principal Problem:   ARF (acute renal failure) (HCC) Active Problems:   Rheumatoid arthritis involving multiple sites with positive rheumatoid factor (HCC)   History of hypertension   History of hypothyroidism   Abnormal liver function   Diarrhea   Pulmonary nodule   Metabolic acidosis   Severe protein-calorie malnutrition (HCC)   Rectal bleeding   LFTs abnormal   Hepatic cirrhosis (HCC)   Pancreatic insufficiency   Ascites     LOS: 14 days   Amy Esterwood PA-C 01/21/2019, 8:47 AM

## 2019-01-21 NOTE — Progress Notes (Signed)
Physical Therapy Treatment Patient Details Name: Kaylee Rasmussen MRN: [id number] DOB: 1951-01-28 Today's Date: 01/21/2019    History of Present Illness 68 yo female admitted to ED 7/16 with rectal bleeding, CT abdomen pelvis revealing ? liver cirrhosis, ascites, small pleural effusions. Colonoscopy 7/19 reveals internal and external hemorrhoids, diverticulosis. 7/22 paracentesis yielding 4.5L clear yellow fluid. PMH includes HTN, HLD, asthma, RA, necrotizing glomerulonephritis/vasculitis, pancytopenia, chronic diarrhea with N/V.    PT Comments    Pt ambulated short distance in room and assisted to recliner.  MD in to see pt, and pt to have PICC line placed today or tomorrow.   Follow Up Recommendations  SNF     Equipment Recommendations  None recommended by PT    Recommendations for Other Services       Precautions / Restrictions Precautions Precautions: Fall    Mobility  Bed Mobility Overal bed mobility: Needs Assistance Bed Mobility: Supine to Sit     Supine to sit: Min assist     General bed mobility comments: assist for trunk upright  Transfers Overall transfer level: Needs assistance Equipment used: Rolling walker (2 wheeled) Transfers: Sit to/from Stand Sit to Stand: Min assist         General transfer comment: assist for rise and steady  Ambulation/Gait Ambulation/Gait assistance: Min assist Gait Distance (Feet): 12 Feet Assistive device: Rolling walker (2 wheeled) Gait Pattern/deviations: Step-through pattern;Decreased stride length Gait velocity: decreased   General Gait Details: ambulated within room due to pt's fatigue, assist to steady   Stairs             Wheelchair Mobility    Modified Rankin (Stroke Patients Only)       Balance Overall balance assessment: Needs assistance         Standing balance support: Single extremity supported Standing balance-Leahy Scale: Poor                              Cognition  Arousal/Alertness: Awake/alert Behavior During Therapy: WFL for tasks assessed/performed;Flat affect Overall Cognitive Status: Within Functional Limits for tasks assessed                                        Exercises      General Comments        Pertinent Vitals/Pain Pain Assessment: Faces Faces Pain Scale: Hurts a little bit Pain Location: abdomen Pain Descriptors / Indicators: Aching Pain Intervention(s): Monitored during session;Repositioned    Home Living                      Prior Function            PT Goals (current goals can now be found in the care plan section) Progress towards PT goals: Progressing toward goals    Frequency    Min 2X/week      PT Plan Current plan remains appropriate    Co-evaluation              AM-PAC PT "6 Clicks" Mobility   Outcome Measure  Help needed turning from your back to your side while in a flat bed without using bedrails?: A Little Help needed moving from lying on your back to sitting on the side of a flat bed without using bedrails?: A Little Help needed moving to and from a bed to  a chair (including a wheelchair)?: A Little Help needed standing up from a chair using your arms (e.g., wheelchair or bedside chair)?: A Little Help needed to walk in hospital room?: A Lot Help needed climbing 3-5 steps with a railing? : A Lot 6 Click Score: 16    End of Session Equipment Utilized During Treatment: Gait belt Activity Tolerance: Patient limited by fatigue Patient left: in chair;with chair alarm set;with call bell/phone within reach   PT Visit Diagnosis: Other abnormalities of gait and mobility (R26.89);Muscle weakness (generalized) (M62.81);History of falling (Z91.81)     Time: 1100-1117 PT Time Calculation (min) (ACUTE ONLY): 17 min  Charges:  $Gait Training: 8-22 mins                     Carmelia Bake, PT, DPT Acute Rehabilitation Services Office: [phone] Pager:  [phone]  Trena Platt 01/21/2019, 4:20 PM

## 2019-01-21 NOTE — Progress Notes (Signed)
Hypoglycemic Event  CBG: 67  Treatment: D50 25 mL (12.5 gm)  Symptoms: None  Follow-up CBG: Time:1759 CBG Result:109  Possible Reasons for Event: Inadequate meal intake  Comments/MD notified: Dr Wyline Copas notified.    Randa Lynn D

## 2019-01-21 NOTE — Transfer of Care (Signed)
Immediate Anesthesia Transfer of Care Note  Patient: Kaylee Rasmussen  Procedure(s) Performed: ESOPHAGOGASTRODUODENOSCOPY (EGD) WITH PROPOFOL (N/A ) BIOPSY  Patient Location: Endoscopy Unit  Anesthesia Type:MAC  Level of Consciousness: awake and patient cooperative  Airway & Oxygen Therapy: Patient Spontanous Breathing and Patient connected to face mask oxygen  Post-op Assessment: Report given to RN and Post -op Vital signs reviewed and stable  Post vital signs: Reviewed and stable  Last Vitals:  Vitals Value Taken Time  BP 99/45 01/21/19 1620  Temp 34.9 C 01/21/19 1617  Pulse 70 01/21/19 1621  Resp 28 01/21/19 1621  SpO2 100 % 01/21/19 1621  Vitals shown include unvalidated device data.  Last Pain:  Vitals:   01/21/19 1620  TempSrc:   PainSc: Asleep      Patients Stated Pain Goal: 0 (27/03/50 0938)  Complications: No apparent anesthesia complications

## 2019-01-21 NOTE — Progress Notes (Signed)
Peripherally Inserted Central Catheter/Midline Placement  The IV Nurse has discussed with the patient and/or persons authorized to consent for the patient, the purpose of this procedure and the potential benefits and risks involved with this procedure.  The benefits include less needle sticks, lab draws from the catheter, and the patient may be discharged home with the catheter. Risks include, but not limited to, infection, bleeding, blood clot (thrombus formation), and puncture of an artery; nerve damage and irregular heartbeat and possibility to perform a PICC exchange if needed/ordered by physician.  Alternatives to this procedure were also discussed.  Bard Power PICC patient education guide, fact sheet on infection prevention and patient information card has been provided to patient /or left at bedside.    PICC/Midline Placement Documentation  PICC Single Lumen 01/21/19 PICC Right Brachial 35 cm 1 cm (Active)  Indication for Insertion or Continuance of Line Poor Vasculature-patient has had multiple peripheral attempts or PIVs lasting less than 24 hours 01/21/19 1253  Exposed Catheter (cm) 1 cm 01/21/19 1253  Site Assessment Clean;Dry;Intact 01/21/19 1253  Line Status Flushed;Blood return noted 01/21/19 1253  Dressing Type Transparent 01/21/19 1253  Dressing Status Clean;Dry;Intact;Antimicrobial disc in place 01/21/19 1253  Dressing Intervention New dressing 01/21/19 1253  Dressing Change Due 01/28/19 01/21/19 1253       Kaylee Rasmussen 01/21/2019, 12:54 PM

## 2019-01-21 NOTE — Progress Notes (Signed)
Per Dr Wyline Copas, Faythe Ghee to place PICC line, no plan for dialysis.

## 2019-01-21 NOTE — Anesthesia Postprocedure Evaluation (Signed)
Anesthesia Post Note  Patient: Kaylee Rasmussen  Procedure(s) Performed: ESOPHAGOGASTRODUODENOSCOPY (EGD) WITH PROPOFOL (N/A ) BIOPSY HEMOSTASIS CLIP PLACEMENT     Patient location during evaluation: PACU Anesthesia Type: MAC Level of consciousness: awake and alert Pain management: pain level controlled Vital Signs Assessment: post-procedure vital signs reviewed and stable Respiratory status: spontaneous breathing, nonlabored ventilation and respiratory function stable Cardiovascular status: stable and blood pressure returned to baseline Postop Assessment: no apparent nausea or vomiting Anesthetic complications: no    Last Vitals:  Vitals:   01/21/19 1620 01/21/19 1630  BP: (!) 99/45 (!) 107/47  Pulse: 70 74  Resp: (!) 30 (!) 22  Temp:    SpO2: 100% 99%    Last Pain:  Vitals:   01/21/19 1630  TempSrc:   PainSc: 0-No pain                 Adina Puzzo A.

## 2019-01-21 NOTE — Anesthesia Preprocedure Evaluation (Signed)
Anesthesia Evaluation  Patient identified by MRN, date of birth, ID band Patient awake    Reviewed: Allergy & Precautions, NPO status , Patient's Chart, lab work & pertinent test results, reviewed documented beta blocker date and time   Airway Mallampati: II  TM Distance: >3 FB Neck ROM: Full    Dental  (+) Teeth Intact   Pulmonary asthma , COPD,    Pulmonary exam normal        Cardiovascular hypertension, Pt. on medications and Pt. on home beta blockers Normal cardiovascular exam Rhythm:Regular Rate:Normal     Neuro/Psych negative neurological ROS     GI/Hepatic Neg liver ROS, esophagitis   Endo/Other  Hypothyroidism   Renal/GU Renal InsufficiencyRenal disease (2/2 glomerulonephritis)     Musculoskeletal  (+) Arthritis , Rheumatoid disorders,    Abdominal   Peds  Hematology Pancytopenia 2/2 cyclophosphamide   Anesthesia Other Findings   Reproductive/Obstetrics negative OB ROS                             Anesthesia Physical  Anesthesia Plan  ASA: III  Anesthesia Plan: MAC   Post-op Pain Management:    Induction: Intravenous  PONV Risk Score and Plan: 2 and Propofol infusion and Treatment may vary due to age or medical condition  Airway Management Planned: Nasal Cannula and Simple Face Mask  Additional Equipment: None  Intra-op Plan:   Post-operative Plan:   Informed Consent: I have reviewed the patients History and Physical, chart, labs and discussed the procedure including the risks, benefits and alternatives for the proposed anesthesia with the patient or authorized representative who has indicated his/her understanding and acceptance.       Plan Discussed with:   Anesthesia Plan Comments:         Anesthesia Quick Evaluation

## 2019-01-21 NOTE — Op Note (Signed)
[facility] Patient Name: Kaylee Rasmussen Procedure Date: 01/21/2019 MRN: [id number] Attending MD: Justice Britain , MD Date of Birth: 1950-11-27 CSN: [id number] Age: 68 Admit Type: Inpatient Procedure:                Upper GI endoscopy Indications:              Generalized abdominal pain, Dysphagia, Odynophagia,                            Abdominal distention, Anorexia, History of CMV                            colitis (not currently) and prior History of HSV                            esophagitis Providers:                Justice Britain, MD, Jeanella Cara, RN,                            Cherylynn Ridges, Technician Referring MD:             Hospital Team Medicines:                Monitored Anesthesia Care Complications:            No immediate complications. Estimated Blood Loss:     Estimated blood loss was minimal. Procedure:                Pre-Anesthesia Assessment:                           - Prior to the procedure, a History and Physical                            was performed, and patient medications and                            allergies were reviewed. The patient's tolerance of                            previous anesthesia was also reviewed. The risks                            and benefits of the procedure and the sedation                            options and risks were discussed with the patient.                            All questions were answered, and informed consent                            was obtained. Prior Anticoagulants: The patient has                            taken no previous  anticoagulant or antiplatelet                            agents. ASA Grade Assessment: III - A patient with                            severe systemic disease. After reviewing the risks                            and benefits, the patient was deemed in                            satisfactory condition to undergo the procedure.                            After obtaining informed consent, the endoscope was                            passed under direct vision. Throughout the                            procedure, the patient's blood pressure, pulse, and                            oxygen saturations were monitored continuously. The                            GIF-H190 ([id number]) Olympus gastroscope was                            introduced through the mouth, and advanced to the                            second part of duodenum. The upper GI endoscopy was                            accomplished without difficulty. The patient                            tolerated the procedure. Scope In: Scope Out: Findings:      No gross lesions were noted in the proximal esophagus and in the mid       esophagus.      LA Grade B (one or more mucosal breaks greater than 5 mm, not extending       between the tops of two mucosal folds) esophagitis was found in the       distal esophagus. This was biopsied with a cold forceps for histology to       rule out viral pathogenesis. .      Mild portal hypertensive gastropathy was found in the cardia, in the       gastric fundus and in the gastric body.      No other gross lesions were noted in the entire examined stomach.       Biopsies were taken with a cold forceps for histology and Helicobacter  pylori testing.      One non-bleeding cratered duodenal ulcer with a clean ulcer base       (Forrest Class III) was found in the duodenal bulb. The lesion was 7 mm       in largest dimension. Biopsies were taken with a cold forceps for       histology rule out viral pathogenesis. To prevent bleeding after the       biopsy, one hemostatic clip was successfully placed (MR conditional).       There was no bleeding at the end of the procedure.      No gross lesions were noted in the D1/D2 sweep or the second portion of       the duodenum. Biopsies were taken with a cold forceps for histology to       rule out  enteropathy. Impression:               - LA Grade B esophagitis distally. Biopsied.                           - Portal hypertensive gastropathy. No other gross                            lesions in the stomach. Biopsied.                           - Non-bleeding duodenal ulcer with a clean ulcer                            base (Forrest Class III). Biopsied. Clip (MR                            conditional) was placed.                           - No gross lesions in the D1/D2 sweep and in the                            second portion of the duodenum. Biopsied. Moderate Sedation:      Not Applicable - Patient had care per Anesthesia. Recommendation:           - The patient will be observed post-procedure,                            until all discharge criteria are met.                           - Return patient to hospital ward for ongoing care.                           - Increase PPI to 40 BID                           - Await pathology results.                           - Observe patient's clinical course.                           -  Cortrak Placement as soon as possible for                            Nutritional support for the next few weeks for                            optimization overall of her hypoalbuminemia.                           - Would consider repeat Abdominal U/S to evaluate                            for ascites re-accumulation. If pain persists will                            consider at least a diagnostic tap to ensure she                            has not developed peritonitis.                           - The findings and recommendations were discussed                            with the patient.                           - The findings and recommendations were discussed                            with the patient's family.                           - The findings and recommendations were discussed                            with the Hospital Team. Procedure  Code(s):        --- Professional ---                           [phone], Esophagogastroduodenoscopy, flexible,                            transoral; with biopsy, single or multiple Diagnosis Code(s):        --- Professional ---                           K20.9, Esophagitis, unspecified                           K76.6, Portal hypertension                           K31.89, Other diseases of stomach and duodenum  K26.9, Duodenal ulcer, unspecified as acute or                            chronic, without hemorrhage or perforation                           R10.84, Generalized abdominal pain                           R13.10, Dysphagia, unspecified                           R14.0, Abdominal distension (gaseous)                           R63.0, Anorexia CPT copyright 2019 [employer]. All rights reserved. The codes documented in this report are preliminary and upon coder review may  be revised to meet current compliance requirements. Justice Britain, MD 01/21/2019 4:40:58 PM Number of Addenda: 0

## 2019-01-21 NOTE — Progress Notes (Signed)
Dr. Wyline Copas paged regarding feeding tube for patient since patient had EGD this afternoon. IV fluid orders changed to D5NS at 32ml/hr for now until GI determines when feeding tube will be placed. Nightshift RN Ketrina made aware.

## 2019-01-21 NOTE — Progress Notes (Signed)
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  She was admitted for blood in stool and chronic diarrhea and abnormal liver function. She's now s/p colonoscopy with GI  Assessment & Plan:   Principal Problem:   ARF (acute renal failure) (HCC) Active Problems:   Rheumatoid arthritis involving multiple sites with positive rheumatoid factor (HCC)   History of hypertension   History of hypothyroidism   Abnormal liver function   Diarrhea   Pulmonary nodule   Metabolic acidosis   Severe protein-calorie malnutrition (HCC)   Rectal bleeding   LFTs abnormal   Hepatic cirrhosis (HCC)   Pancreatic insufficiency   Ascites  Blood in stool, colitis - GI was consulted, appreciate recommendations - Colonoscopy was performed with findings of localized inflammation at ileocecal valve, external and internal hemorrhoids - see report -CMV found to be negative - No report of further bleeding  Abnormal liver function  - GGT (elevated) - Check ferritin (elevated), iron (wnl), tibc (not calc), ceruloplasmin (wnl), alpha 1 antitripsin (wnl), ana (negative), antismooth muscle ab (wnl), check anti mitochondrial antibody (wnl) - Check MRCP ->with severe diffuse hepatic steatosis (can't exclude cirrhosis) - solitary subcentimeter R liver lesion, likely benign cyst ->recommend f/u MRI with and without IV contrast in 3-6 months -question if secondary to methotrexate [place] vs fatty liver vs other per GI - GI has  recommended outpatient for further work up and biopsy, although possibility for in-house biopsy per below  Chronic diarrhea - gliadin antibodies - wnl, TTG - normal, - Cholestyramine prn - Trial of creon  - Appreciate GI recs - Initially resolved with colestid BID, imodium, and creon     - Colestid stopped 7/31 as it may contribute to nausea, also to get a better assessment of diarrhea  Community Acquired Pneumonia  Fever:  - Bld Cx Neg - CXR with patchy airspace dz in L lung base (early pneumonia vs sequelae of aspiration) - Repeat 2 view CXR with small bilateral effusions and associated atelectasis vs consolidation - Completed course of Ceftriaxone/azithro x 5 days  AKI on CKD III - creatinine worsened in setting of soft BP's on 7/18.  - baseline creatinine appears to be ~1.5, continue to monitor. - No hydro on imaging from earlier in hospitalization -Cr increased to 2.7 after brief course of lasix for swelling. -Diuretics on stopped. Mucus membranes appear dry. Consider gentle hydration when able to  Right Greater than Left Effusions  Moderate Volume Ascites:  -s/p paracentesis w/ 4.5L removed    - Remains on minimal O2 support  Hypotension/Hx of Hypertension - Continue holding antihypertensives for now     - BP currently stable  Non- AG acidosis - Improved, d/c IV bicarb - Pt on PO bicarb; acidosis resolved, currently stable  N/V - Zofran 4mg  iv q6h prn - improved after recent paracentesis     -Colestid stopped per above as it may contribute to nausea  Hypothyroidism - Cont Levothyroxine 88 micrograms po qday as pt tolerates  Anxiety - Cont Lexapro 10mg  po qday as tolerated      -Seems stable  Normocytic Anemia - stable, no frank  bleed noted, monitor    - Cont to follow CBC trends   RA - Off Humira, follows with Deveshwar - Continued on Voltaren  gel as needed  Pulmonary Nodule - Recommended outpatient follow up  Right greater than Left LEE - RLE noted to have no DVT.  Hypoglycemia - Glucose currently stable and w/in normal range -Cont to follow trends  Hypomagnesemia/Hypophospatemia - replete, monitor  Debility - PT recs SNF; spoke with patient, and she has agreed to SNF rehab - bed with [facility] anticipated when medically ready   Toxic metabolic encephalopathy - recent lethargy, however mentation currently much improved -appropriately conversant this AM, seems resolved  Pitting edema - BLE edema and signs of volume overload -2d echo reviewed, normal LVEF -Given trial of lasix with some improvement. Lasix stopped given concerns of intravascular depletion and dehydration -Suspect anasarca related to marked malnutrition -given continued symptoms, appreciate input by GI. Recommendation for EGD this afternoon and tube placement for tube feeding  Severe protein calorie malnutrition -anorexia for several months noted -Likely contributing to anasarca above -Planning for tube placement to initiate tube feeding  DVT prophylaxis: SCD's Code Status: Full Family Communication: Pt in room, family not at bedside Disposition Plan: SNF when medically stable  Consultants:   GI  IR  Procedures:   Colonoscopy 7/19    Antimicrobials: Anti-infectives (From admission, onward)   Start     Dose/Rate Route Frequency Ordered Stop   01/07/19 1700  cefTRIAXone (ROCEPHIN) 2 g in sodium chloride 0.9 % 100 mL IVPB     2 g 200 mL/hr over 30 Minutes Intravenous Every 24 hours 01/07/19 1557 01/11/19 1908   01/07/19 1700  azithromycin (ZITHROMAX) 500 mg in sodium chloride 0.9 % 250 mL IVPB     500 mg 250 mL/hr over 60 Minutes Intravenous Every 24 hours 01/07/19 1557 01/11/19 2014      Subjective: Still without an appetitie  Objective: Vitals:   01/20/19 2101 01/21/19 0506  01/21/19 0551 01/21/19 1426  BP: 123/73 119/61  120/66  Pulse: 90 94  85  Resp:  18  16  Temp: 99.1 F (37.3 C) 98.7 F (37.1 C)  98 F (36.7 C)  TempSrc: Oral Oral  Oral  SpO2: 100% 93%  98%  Weight:   73 kg   Height:        Intake/Output Summary (Last 24 hours) at 01/21/2019 1440 Last data filed at 01/21/2019 1016 Gross per 24 hour  Intake 251.23 ml  Output 20 ml  Net 231.23 ml   Filed Weights   01/19/19 0445 01/20/19 0609 01/21/19 0551  Weight: 70.8 kg 73.1 kg 73 kg    Examination: General exam: Conversant, in no acute distress Respiratory system: normal chest rise, clear, no audible wheezing Cardiovascular system: regular rhythm, s1-s2 Gastrointestinal system: Nondistended, nontender, pos BS Central nervous system: No seizures, no tremors Extremities: No cyanosis, no joint deformities, BLE edema Skin: No rashes, no pallor, anasarca Psychiatry: Affect normal // no auditory hallucinations    Data Reviewed: I have personally reviewed following labs and imaging studies  CBC: Recent Labs  Lab 01/15/19 1305  WBC 8.3  HGB 9.6*  HCT 31.4*  MCV 100.3*  PLT 409*   Basic Metabolic Panel: Recent Labs  Lab 01/15/19 1305 01/16/19 0833 01/20/19 0533 01/21/19 0517  NA 142 143 140 138  K 4.6 5.3* 4.4 4.5  CL 109 112* 107 106  CO2 22 21* 23 25  GLUCOSE 80 94 86 84  BUN 36*  36* 40* 46*  CREATININE 2.16* 2.12* 2.63* 2.73*  CALCIUM 7.9* 8.0* 8.2* 8.1*  PHOS 2.3* 2.5  --   --    GFR: Estimated Creatinine Clearance: 18.3 mL/min (A) (by C-G formula based on SCr of 2.73 mg/dL (H)). Liver Function Tests: Recent Labs  Lab 01/15/19 1305 01/16/19 0833 01/20/19 0533 01/21/19 0517  AST  --   --  62* 57*  ALT  --   --  18 13  ALKPHOS  --   --  231* 191*  BILITOT  --   --  3.2* 3.2*  PROT  --   --  5.3* 4.8*  ALBUMIN 2.1* 2.0* 2.0* 1.7*   No results for input(s): LIPASE, AMYLASE in the last 168 hours. Recent Labs  Lab 01/16/19 0833  AMMONIA 103*   Coagulation  Profile: Recent Labs  Lab 01/21/19 0517  INR 1.2   Cardiac Enzymes: No results for input(s): CKTOTAL, CKMB, CKMBINDEX, TROPONINI in the last 168 hours. BNP (last 3 results) No results for input(s): PROBNP in the last 8760 hours. HbA1C: No results for input(s): HGBA1C in the last 72 hours. CBG: Recent Labs  Lab 01/20/19 0837 01/20/19 1159 01/20/19 1702 01/21/19 0819 01/21/19 1220  GLUCAP 83 95 98 75 81   Lipid Profile: No results for input(s): CHOL, HDL, LDLCALC, TRIG, CHOLHDL, LDLDIRECT in the last 72 hours. Thyroid Function Tests: No results for input(s): TSH, T4TOTAL, FREET4, T3FREE, THYROIDAB in the last 72 hours. Anemia Panel: No results for input(s): VITAMINB12, FOLATE, FERRITIN, TIBC, IRON, RETICCTPCT in the last 72 hours. Sepsis Labs: No results for input(s): PROCALCITON, LATICACIDVEN in the last 168 hours.  Recent Results (from the past 240 hour(s))  Culture, body fluid-bottle     Status: None   Collection Time: 01/12/19  4:17 PM   Specimen: Fluid  Result Value Ref Range Status   Specimen Description FLUID PERITONEAL  Final   Special Requests BOTTLES DRAWN AEROBIC AND ANAEROBIC  Final   Culture   Final    NO GROWTH 5 DAYS Performed at [facility], [address], [place], [place] [address]    Report Status 01/17/2019 FINAL  Final  Gram stain     Status: None   Collection Time: 01/12/19  4:17 PM   Specimen: Fluid  Result Value Ref Range Status   Specimen Description FLUID PERITONEAL  Final   Special Requests NONE  Final   Gram Stain   Final    MODERATE WBC PRESENT, PREDOMINANTLY MONONUCLEAR NO ORGANISMS SEEN Performed at [facility], [place] [address], [place], [place] [address]    Report Status 01/12/2019 FINAL  Final  Novel Coronavirus, NAA (hospital order; send-out to ref lab)     Status: None   Collection Time: 01/14/19  2:00 PM   Specimen: Nasopharyngeal Swab; Respiratory  Result Value Ref Range Status   SARS-CoV-2, NAA NOT DETECTED  NOT DETECTED Final    Comment: (NOTE) This test was developed and its performance characteristics determined by [employer]. This test has not been FDA cleared or approved. This test has been authorized by FDA under an Emergency Use Authorization (EUA). This test is only authorized for the duration of time the declaration that circumstances exist justifying the authorization of the emergency use of in vitro diagnostic tests for detection of SARS-CoV-2 virus and/or diagnosis of COVID-19 infection under section 564(b)(1) of the Act, 21 U.S.C. 170YFV-4(B)(4), unless the authorization is terminated or revoked sooner. When diagnostic testing is negative, the possibility of  a false negative result should be considered in the context of a patient's recent exposures and the presence of clinical signs and symptoms consistent with COVID-19. An individual without symptoms of COVID-19 and who is not shedding SARS-CoV-2 virus would expect to have a negative (not detected) result in this assay. Performed  At: [facility] [place], [place] [id number] Rush Farmer MD [id number]    [place]  Final    Comment: Performed at [place] [address], [place], [place] [address]  Novel Coronavirus, NAA (hospital order; send-out to ref lab)     Status: None   Collection Time: 01/17/19  7:37 PM   Specimen: Nasopharyngeal Swab; Respiratory  Result Value Ref Range Status   SARS-CoV-2, NAA NOT DETECTED NOT DETECTED Final    Comment: (NOTE) This test was developed and its performance characteristics determined by [employer]. This test has not been FDA cleared or approved. This test has been authorized by FDA under an Emergency Use Authorization (EUA). This test is only authorized for the duration of time the declaration that circumstances exist justifying the authorization of the emergency use of in vitro  diagnostic tests for detection of SARS-CoV-2 virus and/or diagnosis of COVID-19 infection under section 564(b)(1) of the Act, 21 U.S.C. 179XTA-5(W)(9), unless the authorization is terminated or revoked sooner. When diagnostic testing is negative, the possibility of a false negative result should be considered in the context of a patient's recent exposures and the presence of clinical signs and symptoms consistent with COVID-19. An individual without symptoms of COVID-19 and who is not shedding SARS-CoV-2 virus would expect to have a negative (not detected) result in this assay. Performed  At: [facility] [place], [place] [id number] Rush Farmer MD [id number]    [place]  Final    Comment: Performed at [place] [address], [place], [place] [address]     Radiology Studies: [place] Ekg Site Rite  Result Date: 01/21/2019 If [facility] image not attached, placement could not be confirmed due to current cardiac rhythm.   Scheduled Meds: . budesonide  9 mg Oral Daily  . calcium carbonate  500 mg of elemental calcium Oral BID   And  . cholecalciferol  200 Units Oral BID  . escitalopram  10 mg Oral Daily  . feeding supplement (ENSURE ENLIVE)  237 mL Oral Q24H  . feeding supplement (PRO-STAT SUGAR FREE 64)  30 mL Oral TID BM  . levothyroxine  88 mcg Oral Q0600  . magnesium oxide  400 mg Oral BID  . multivitamin with minerals  1 tablet Oral Daily  . ondansetron  4 mg Intravenous Q8H  . pantoprazole (PROTONIX) IV  40 mg Intravenous Q24H  . sodium bicarbonate  650 mg Oral BID   Continuous Infusions: . sodium chloride 50 mL/hr at 01/21/19 1322     LOS: 14 days   Marylu Lund, MD [facility] Pager On Amion  If 7PM-7AM, please contact night-coverage 01/21/2019, 2:40 PM

## 2019-01-22 LAB — GLUCOSE, CAPILLARY
Glucose-Capillary: 116 mg/dL — ABNORMAL HIGH (ref 70–99)
Glucose-Capillary: 109 mg/dL — ABNORMAL HIGH (ref 70–99)
Glucose-Capillary: 105 mg/dL — ABNORMAL HIGH (ref 70–99)

## 2019-01-22 LAB — COMPREHENSIVE METABOLIC PANEL
ALT: 15 U/L (ref 0–44)
AST: 57 U/L — ABNORMAL HIGH (ref 15–41)
Albumin: 1.7 g/dL — ABNORMAL LOW (ref 3.5–5.0)
Alkaline Phosphatase: 177 U/L — ABNORMAL HIGH (ref 38–126)
Anion gap: 6 (ref 5–15)
BUN: 55 mg/dL — ABNORMAL HIGH (ref 8–23)
CO2: 26 mmol/L (ref 22–32)
Calcium: 7.9 mg/dL — ABNORMAL LOW (ref 8.9–10.3)
Chloride: 107 mmol/L (ref 98–111)
Creatinine, Ser: 2.83 mg/dL — ABNORMAL HIGH (ref 0.44–1.00)
GFR calc Af Amer: 19 mL/min — ABNORMAL LOW (ref >60)
GFR calc non Af Amer: 17 mL/min — ABNORMAL LOW (ref >60)
Glucose, Bld: 119 mg/dL — ABNORMAL HIGH (ref 70–99)
Potassium: 4.5 mmol/L (ref 3.5–5.1)
Sodium: 139 mmol/L (ref 135–145)
Total Bilirubin: 2.7 mg/dL — ABNORMAL HIGH (ref 0.3–1.2)
Total Protein: 4.8 g/dL — ABNORMAL LOW (ref 6.5–8.1)

## 2019-01-22 LAB — CBC
HCT: 28 % — ABNORMAL LOW (ref 36.0–46.0)
Hemoglobin: 8.8 g/dL — ABNORMAL LOW (ref 12.0–15.0)
MCH: 31.4 pg (ref 26.0–34.0)
MCHC: 31.4 g/dL (ref 30.0–36.0)
MCV: 100 fL (ref 80.0–100.0)
Platelets: 159 10*3/uL (ref 150–400)
RBC: 2.8 MIL/uL — ABNORMAL LOW (ref 3.87–5.11)
RDW: 20.2 % — ABNORMAL HIGH (ref 11.5–15.5)
WBC: 14.2 10*3/uL — ABNORMAL HIGH (ref 4.0–10.5)
nRBC: 0 % (ref 0.0–0.2)

## 2019-01-22 NOTE — Progress Notes (Signed)
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  She was admitted for blood in stool and chronic diarrhea and abnormal liver function. She's now s/p colonoscopy with GI  Assessment & Plan:   Principal Problem:   ARF (acute renal failure) (HCC) Active Problems:   Rheumatoid arthritis involving multiple sites with positive rheumatoid factor (HCC)   History of hypertension   History of hypothyroidism   Abnormal liver function   Diarrhea   Pulmonary nodule   Metabolic acidosis   Severe protein-calorie malnutrition (HCC)   Rectal bleeding   LFTs abnormal   Hepatic cirrhosis (HCC)   Pancreatic insufficiency   Ascites  Blood in stool, colitis - GI was consulted, appreciate recommendations - Colonoscopy was performed with findings of localized inflammation at ileocecal valve, external and internal hemorrhoids - see report -CMV found to be negative - No report of further bleeding at this time  Abnormal liver function  - GGT (elevated) - Check ferritin (elevated), iron (wnl), tibc (not calc), ceruloplasmin (wnl), alpha 1 antitripsin (wnl), ana (negative), antismooth muscle ab (wnl), check anti mitochondrial antibody (wnl) - Check MRCP ->with severe diffuse hepatic steatosis (can't exclude cirrhosis) - solitary subcentimeter R liver lesion, likely benign cyst ->recommend f/u MRI with and without IV contrast in 3-6 months -question if secondary to methotrexate [place] vs fatty liver vs other per GI  - GI has recommended outpatient for further work up and biopsy, although possibility for in-house biopsy was noted  Chronic diarrhea - gliadin antibodies - wnl, TTG - normal, - Cholestyramine prn - Trial of creon  - Appreciate GI recs - Initially resolved with colestid BID, imodium, and creon     - Colestid stopped 7/31 as it may contribute to nausea, also to get a better assessment of diarrhea     -one bowel movement reported overnight  Community Acquired Pneumonia  Fever:  - Bld Cx Neg - CXR with patchy airspace dz in L lung base (early pneumonia vs sequelae of aspiration) - Repeat 2 view CXR with small bilateral effusions and associated atelectasis vs consolidation - Completed course of Ceftriaxone/azithro x 5 days    - Afebrile  AKI on CKD III - creatinine worsened in setting of soft BP's on 7/18.  - baseline creatinine appears to be ~1.5, continue to monitor. - No hydro on imaging from earlier in hospitalization -Cr increased to 2.7 after brief course of lasix for swelling.      -Diuretics on stopped. Mucus membranes appear dry.      - Currently on gentle IVF hydration     -Cr up to 2.8 today. Will repeat bmet in AM  Right Greater than Left Effusions  Moderate Volume Ascites:  -s/p paracentesis w/ 4.5L removed    - Currently remains on minimal O2 support  Hypotension/Hx of Hypertension - Continue holding antihypertensives for now     - BP thus far stable  Non- AG acidosis - Improved, d/c IV bicarb - Pt on PO bicarb; acidosis resolved, currently stable  N/V - Zofran 4mg  iv q6h prn - improved after recent paracentesis     -Colestid stopped per  above as it may contribute to nausea    - Seems stable at this time  Hypothyroidism - Cont Levothyroxine 88 micrograms po qday as pt tolerates  Anxiety - Cont Lexapro 10mg  po qday as tolerated      -Currently stable   Normocytic Anemia - stable, no frank bleed noted, monitor    - Cont to follow CBC trends   RA - Off Humira, follows with Deveshwar - Will continue with Voltaren gel as needed  Pulmonary Nodule - Recommended outpatient follow up  Right greater than Left LEE - RLE noted to have no DVT.  Hypoglycemia - Glucose currently stable and w/in normal range -Cont to follow trends  Hypomagnesemia/Hypophospatemia - replaced  Debility - PT recs SNF; Pt initially agreed with SNF      - Pt reportedly now wishes to be discharged home instead. Not medically ready for d/c yet. Will discuss with pt. If pt desires d/c home, then anticipate maximizing home health   Toxic metabolic encephalopathy - recent lethargy, however mentation currently much improved -appropriately conversant this AM, resolved  Pitting edema - BLE edema and signs of volume overload -2d echo reviewed, normal LVEF -Given trial of lasix with some improvement. Lasix stopped given concerns of intravascular depletion and dehydration -Suspect anasarca related to marked malnutrition -GI recommendation for Cortrak, orders placed -While awaiting tube placement, will allow PO as tolerated  Severe protein calorie malnutrition -anorexia for several months noted -Likely contributing to anasarca above -Planning for tube placement to initiate tube feeding -Will allow PO intake as tolerated  DVT prophylaxis: SCD's Code Status: Full Family Communication: Pt in room, family not at bedside Disposition Plan: SNF vs home with maximized home health  Consultants:   GI  IR  Procedures:   Colonoscopy 7/19  EGD 7/31  Antimicrobials: Anti-infectives (From admission, onward)   Start     Dose/Rate Route Frequency Ordered Stop   01/07/19 1700  cefTRIAXone (ROCEPHIN) 2 g in sodium chloride 0.9 % 100 mL IVPB     2 g 200 mL/hr over 30 Minutes Intravenous Every 24 hours 01/07/19 1557 01/11/19  1908   01/07/19 1700  azithromycin (ZITHROMAX) 500 mg in sodium chloride 0.9 % 250 mL IVPB     500 mg 250 mL/hr over 60 Minutes Intravenous Every 24 hours 01/07/19 1557 01/11/19 2014      Subjective: Asking about diet this AM  Objective: Vitals:   01/21/19 1630 01/21/19 1737 01/21/19 2107 01/22/19 0501  BP: (!) 107/47 (!) 114/59 122/66 116/71  Pulse: 74 69 75 90  Resp: (!) 22 12 16 16   Temp:  98 F (36.7 C) 98 F (36.7 C) 98.1 F (36.7 C)  TempSrc:  Oral Oral Oral  SpO2: 99% 97% 96% 95%  Weight:    73 kg  Height:        Intake/Output Summary (Last 24 hours) at 01/22/2019 1800 Last data filed at 01/22/2019 1524 Gross per 24 hour  Intake 988.12 ml  Output -  Net 988.12 ml   Filed Weights   01/20/19 0609 01/21/19 0551 01/22/19 0501  Weight: 73.1 kg 73 kg 73 kg    Examination: General exam: Awake, laying in bed, in nad Respiratory system: Normal respiratory effort, no wheezing Cardiovascular system: regular rate, s1, s2 Gastrointestinal system: Soft, nondistended, positive BS Central nervous system: CN2-12 grossly intact, strength intact Extremities: Perfused, no clubbing, 2+pitting edema Skin: Normal skin turgor, no notable skin lesions seen Psychiatry: Mood normal // no visual hallucinations   Data  Reviewed: I have personally reviewed following labs and imaging studies  CBC: Recent Labs  Lab 01/22/19 0348  WBC 14.2*  HGB 8.8*  HCT 28.0*  MCV 100.0  PLT 453   Basic Metabolic Panel: Recent Labs  Lab 01/16/19 0833 01/20/19 0533 01/21/19 0517 01/22/19 0348  NA 143 140 138 139  K 5.3* 4.4 4.5 4.5  CL 112* 107 106 107  CO2 21* 23 25 26   GLUCOSE 94 86 84 119*  BUN 36* 40* 46* 55*  CREATININE 2.12* 2.63* 2.73* 2.83*  CALCIUM 8.0* 8.2* 8.1* 7.9*  PHOS 2.5  --   --   --    GFR: Estimated Creatinine Clearance: 17.6 mL/min (A) (by C-G formula based on SCr of 2.83 mg/dL (H)). Liver Function Tests: Recent Labs  Lab 01/16/19 0833 01/20/19 0533 01/21/19  0517 01/22/19 0348  AST  --  62* 57* 57*  ALT  --  18 13 15   ALKPHOS  --  231* 191* 177*  BILITOT  --  3.2* 3.2* 2.7*  PROT  --  5.3* 4.8* 4.8*  ALBUMIN 2.0* 2.0* 1.7* 1.7*   No results for input(s): LIPASE, AMYLASE in the last 168 hours. Recent Labs  Lab 01/16/19 0833  AMMONIA 103*   Coagulation Profile: Recent Labs  Lab 01/21/19 0517  INR 1.2   Cardiac Enzymes: No results for input(s): CKTOTAL, CKMB, CKMBINDEX, TROPONINI in the last 168 hours. BNP (last 3 results) No results for input(s): PROBNP in the last 8760 hours. HbA1C: No results for input(s): HGBA1C in the last 72 hours. CBG: Recent Labs  Lab 01/21/19 1704 01/21/19 1759 01/22/19 0730 01/22/19 1232 01/22/19 1634  GLUCAP 67* 109* 116* 109* 105*   Lipid Profile: No results for input(s): CHOL, HDL, LDLCALC, TRIG, CHOLHDL, LDLDIRECT in the last 72 hours. Thyroid Function Tests: No results for input(s): TSH, T4TOTAL, FREET4, T3FREE, THYROIDAB in the last 72 hours. Anemia Panel: No results for input(s): VITAMINB12, FOLATE, FERRITIN, TIBC, IRON, RETICCTPCT in the last 72 hours. Sepsis Labs: No results for input(s): PROCALCITON, LATICACIDVEN in the last 168 hours.  Recent Results (from the past 240 hour(s))  Novel Coronavirus, NAA (hospital order; send-out to ref lab)     Status: None   Collection Time: 01/14/19  2:00 PM   Specimen: Nasopharyngeal Swab; Respiratory  Result Value Ref Range Status   SARS-CoV-2, NAA NOT DETECTED NOT DETECTED Final    Comment: (NOTE) This test was developed and its performance characteristics determined by [employer]. This test has not been FDA cleared or approved. This test has been authorized by FDA under an Emergency Use Authorization (EUA). This test is only authorized for the duration of time the declaration that circumstances exist justifying the authorization of the emergency use of in vitro diagnostic tests for detection of SARS-CoV-2 virus and/or diagnosis  of COVID-19 infection under section 564(b)(1) of the Act, 21 U.S.C. 646OEH-2(Z)(2), unless the authorization is terminated or revoked sooner. When diagnostic testing is negative, the possibility of a false negative result should be considered in the context of a patient's recent exposures and the presence of clinical signs and symptoms consistent with COVID-19. An individual without symptoms of COVID-19 and who is not shedding SARS-CoV-2 virus would expect to have a negative (not detected) result in this assay. Performed  At: [facility] [place], [place] [id number] Rush Farmer MD CW:[id number]    [place]  Final    Comment: Performed at [facility] [address]  [place], [place] [address]  Novel Coronavirus, NAA (hospital order; send-out to ref lab)     Status: None   Collection Time: 01/17/19  7:37 PM   Specimen: Nasopharyngeal Swab; Respiratory  Result Value Ref Range Status   SARS-CoV-2, NAA NOT DETECTED NOT DETECTED Final    Comment: (NOTE) This test was developed and its performance characteristics determined by [employer]. This test has not been FDA cleared or approved. This test has been authorized by FDA under an Emergency Use Authorization (EUA). This test is only authorized for the duration of time the declaration that circumstances exist justifying the authorization of the emergency use of in vitro diagnostic tests for detection of SARS-CoV-2 virus and/or diagnosis of COVID-19 infection under section 564(b)(1) of the Act, 21 U.S.C. 856DJS-9(F)(0), unless the authorization is terminated or revoked sooner. When diagnostic testing is negative, the possibility of a false negative result should be considered in the context of a patient's recent exposures and the presence of clinical signs and symptoms consistent with COVID-19. An individual without symptoms of COVID-19 and who is not shedding  SARS-CoV-2 virus would expect to have a negative (not detected) result in this assay. Performed  At: [facility] [place], [place] [id number] Rush Farmer MD OY:[id number]    [place]  Final    Comment: Performed at [facility] [address], [place], [place] [address]     Radiology Studies: US Liver Doppler  Result Date: 01/22/2019 CLINICAL DATA:  Abnormal LFTs, fatty liver on MR, nodular liver contour suggesting possible cirrhosis EXAM: DUPLEX ULTRASOUND OF LIVER TECHNIQUE: Color and duplex Doppler ultrasound was performed to evaluate the hepatic in-flow and out-flow vessels. Technologist describes technically difficult study secondary to patient intolerance and steatohepatitis. COMPARISON:  MR 01/07/2019, CT 01/06/2019 FINDINGS: Portal Vein 0.5 cm diameter.  Velocities: Main: None detected Right:  11.3 cm/sec retrograde (hepatofugal) Left:  16.4 cm/sec antegrade (hepatopetal) Hepatic Vein Velocities (all hepatofugal): Right: Not recorded Middle:  35 cm/sec Left:  21 cm/sec IVC: 42 cm/sec Hepatic Artery Velocity:  131 cm/sec Spleen 5.5 x 7.4 x 2.7 cm (volume = 58 cm^3). Splenic Vein: No evidence of occlusion or thrombus. Velocity: 19 cm/sec Varices: Paraumbilical vein identified Ascites: Moderate IMPRESSION: 1. Limited study, with no flow confirmed in the main portal vein; portal vein patency had been demonstrated on MR 01/07/2019. There is retrograde right portal vein flow, antegrade flow in the left portal vein and recanalized paraumbilical portosystemic collateral. 2. Moderate abdominal ascites Electronically Signed   By: Lucrezia Europe M.D.   On: 01/22/2019 10:14   [place] Ekg Site Rite  Result Date: 01/21/2019 If Site Rite image not attached, placement could not be confirmed due to current cardiac rhythm.   Scheduled Meds: . budesonide  9 mg Oral Daily  . calcium carbonate  500 mg of elemental calcium Oral BID   And  .  cholecalciferol  200 Units Oral BID  . escitalopram  10 mg Oral Daily  . feeding supplement (ENSURE ENLIVE)  237 mL Oral Q24H  . feeding supplement (PRO-STAT SUGAR FREE 64)  30 mL Oral TID BM  . levothyroxine  88 mcg Oral Q0600  . magnesium oxide  400 mg Oral BID  . multivitamin with minerals  1 tablet Oral Daily  . ondansetron  4 mg Intravenous Q8H  . pantoprazole  40 mg Oral BID  . sodium bicarbonate  650 mg Oral BID   Continuous Infusions: . dextrose 5 % and 0.9% NaCl  50 mL/hr at 01/22/19 1627     LOS: 15 days   Marylu Lund, MD [facility] Pager On Amion  If 7PM-7AM, please contact night-coverage 01/22/2019, 6:00 PM

## 2019-01-23 ENCOUNTER — Inpatient Hospital Stay (HOSPITAL_COMMUNITY): Payer: PPO

## 2019-01-23 DIAGNOSIS — E639 Nutritional deficiency, unspecified: Secondary | ICD-10-CM

## 2019-01-23 DIAGNOSIS — E8809 Other disorders of plasma-protein metabolism, not elsewhere classified: Secondary | ICD-10-CM

## 2019-01-23 LAB — COMPREHENSIVE METABOLIC PANEL
ALT: 15 U/L (ref 0–44)
AST: 59 U/L — ABNORMAL HIGH (ref 15–41)
Albumin: 1.7 g/dL — ABNORMAL LOW (ref 3.5–5.0)
Alkaline Phosphatase: 187 U/L — ABNORMAL HIGH (ref 38–126)
Anion gap: 9 (ref 5–15)
BUN: 56 mg/dL — ABNORMAL HIGH (ref 8–23)
CO2: 22 mmol/L (ref 22–32)
Calcium: 7.7 mg/dL — ABNORMAL LOW (ref 8.9–10.3)
Chloride: 108 mmol/L (ref 98–111)
Creatinine, Ser: 2.73 mg/dL — ABNORMAL HIGH (ref 0.44–1.00)
GFR calc Af Amer: 20 mL/min — ABNORMAL LOW (ref >60)
GFR calc non Af Amer: 17 mL/min — ABNORMAL LOW (ref >60)
Glucose, Bld: 98 mg/dL (ref 70–99)
Potassium: 4.2 mmol/L (ref 3.5–5.1)
Sodium: 139 mmol/L (ref 135–145)
Total Bilirubin: 2 mg/dL — ABNORMAL HIGH (ref 0.3–1.2)
Total Protein: 4.6 g/dL — ABNORMAL LOW (ref 6.5–8.1)

## 2019-01-23 LAB — GLUCOSE, CAPILLARY
Glucose-Capillary: 88 mg/dL (ref 70–99)
Glucose-Capillary: 77 mg/dL (ref 70–99)
Glucose-Capillary: 99 mg/dL (ref 70–99)
Glucose-Capillary: 85 mg/dL (ref 70–99)

## 2019-01-23 LAB — SODIUM, URINE, RANDOM: Sodium, Ur: <10 mmol/L

## 2019-01-23 NOTE — Progress Notes (Signed)
Call placed to Ina Kick patients spouse  to provide update plan plan of care. Will place follow up call regarding Cortrak tube.

## 2019-01-23 NOTE — Progress Notes (Signed)
Gastroenterology Inpatient Follow-up Note   PATIENT IDENTIFICATION  Kaylee Rasmussen is a 68 y.o. female with a pmh significant for hypertension, COPD, rheumatoid arthritis, asthma, Necrotizing glomerulonephritis, chronic renal insufficiency previous CMV colitis, previous HSV esophagitis, readmitted with weight loss, diarrhea and concern for potential underlying cirrhosis.  GI reconsulted for hypoalbuminemia and persistent ascites and endoscopy performed. Hospital Day: 18  SUBJECTIVE  Patient's EGD biopsy remain pending at this point in time.  I did have concern on the findings of likely portal hypertensive gastropathy being seen.  The patient feels no better or no worse from yesterday.  She continues to have abdominal distention and discomfort which is been ongoing for weeks to months.  She does not feel taut and does not feel that she is not able to move in the bed.  Core track was not able to be placed on Saturday.  Patient's nutrition will continued to monitor during her hospitalization.  Creatinine continues to trend upwards in the 2.7 range.   OBJECTIVE  Scheduled Inpatient Medications:   budesonide  9 mg Oral Daily   calcium carbonate  500 mg of elemental calcium Oral BID   And   cholecalciferol  200 Units Oral BID   escitalopram  10 mg Oral Daily   feeding supplement (ENSURE ENLIVE)  237 mL Oral Q24H   feeding supplement (PRO-STAT SUGAR FREE 64)  30 mL Oral TID BM   levothyroxine  88 mcg Oral Q0600   magnesium oxide  400 mg Oral BID   multivitamin with minerals  1 tablet Oral Daily   ondansetron  4 mg Intravenous Q8H   pantoprazole  40 mg Oral BID   sodium bicarbonate  650 mg Oral BID   Continuous Inpatient Infusions:   dextrose 5 % and 0.9% NaCl 50 mL/hr at 01/22/19 1627   PRN Inpatient Medications: acetaminophen, albuterol, diclofenac sodium, loperamide, morphine injection, ondansetron, oxyCODONE, sodium chloride flush, sodium chloride flush   Physical  Examination  Temp:  [98.2 F (36.8 C)-98.7 F (37.1 C)] 98.2 F (36.8 C) (08/02 0604) Pulse Rate:  [83-96] 83 (08/02 0604) Resp:  [16] 16 (08/02 0604) BP: (106-111)/(61-67) 106/61 (08/02 0604) SpO2:  [96 %] 96 % (08/02 0604) Weight:  [73.8 kg] 73.8 kg (08/02 0604) Temp (24hrs), Avg:98.5 F (36.9 C), Min:98.2 F (36.8 C), Max:98.7 F (37.1 C)  Weight: 73.8 kg GEN: Waking up from bed this morning, looks rested and nontoxic PSYCH: Cooperative, without pressured speech EYE: Mild scleral icterus ENT: MMM, without oral ulcers CV: Non-tachycardic RESP: No adventitious breath sounds present GI: NABS, soft, protuberant, distended, tenderness to palpation throughout the abdomen, negative jolt sign, hepatomegaly is appreciated, no rebound or volitional guarding  MSK/EXT: Bilateral lower extremity edema present SKIN: No overt jaundice NEURO:  Alert & Oriented x 3, no focal deficits, no evidence of asterixis   Review of Data   Laboratory Studies   Recent Labs  Lab 01/23/19 0455  NA 139  K 4.2  CL 108  CO2 22  BUN 56*  CREATININE 2.73*  GLUCOSE 98  CALCIUM 7.7*   Recent Labs  Lab 01/23/19 0455  AST 59*  ALT 15  ALKPHOS 187*    Recent Labs  Lab 01/22/19 0348  WBC 14.2*  HGB 8.8*  HCT 28.0*  PLT 159   Recent Labs  Lab 01/21/19 0517  INR 1.2   MELD-Na score: 21 at 01/23/2019  4:55 AM MELD score: 21 at 01/23/2019  4:55 AM Calculated from: Serum Creatinine: 2.73 mg/dL at  01/23/2019  4:55 AM Serum Sodium: 139 mmol/L (Rounded to 137 mmol/L) at 01/23/2019  4:55 AM Total Bilirubin: 2.0 mg/dL at 01/23/2019  4:55 AM INR(ratio): 1.2 at 01/21/2019  5:17 AM Age: 81 years  Imaging Studies  US Liver Doppler  Result Date: 01/22/2019 CLINICAL DATA:  Abnormal LFTs, fatty liver on MR, nodular liver contour suggesting possible cirrhosis EXAM: DUPLEX ULTRASOUND OF LIVER TECHNIQUE: Color and duplex Doppler ultrasound was performed to evaluate the hepatic in-flow and out-flow vessels.  Technologist describes technically difficult study secondary to patient intolerance and steatohepatitis. COMPARISON:  MR 01/07/2019, CT 01/06/2019 FINDINGS: Portal Vein 0.5 cm diameter.  Velocities: Main: None detected Right:  11.3 cm/sec retrograde (hepatofugal) Left:  16.4 cm/sec antegrade (hepatopetal) Hepatic Vein Velocities (all hepatofugal): Right: Not recorded Middle:  35 cm/sec Left:  21 cm/sec IVC: 42 cm/sec Hepatic Artery Velocity:  131 cm/sec Spleen 5.5 x 7.4 x 2.7 cm (volume = 58 cm^3). Splenic Vein: No evidence of occlusion or thrombus. Velocity: 19 cm/sec Varices: Paraumbilical vein identified Ascites: Moderate IMPRESSION: 1. Limited study, with no flow confirmed in the main portal vein; portal vein patency had been demonstrated on MR 01/07/2019. There is retrograde right portal vein flow, antegrade flow in the left portal vein and recanalized paraumbilical portosystemic collateral. 2. Moderate abdominal ascites Electronically Signed   By: Lucrezia Europe M.D.   On: 01/22/2019 10:14   [place] Ekg Site Rite  Result Date: 01/21/2019 If Site Rite image not attached, placement could not be confirmed due to current cardiac rhythm.  GI Studies  - LA Grade B esophagitis distally. Biopsied. - Portal hypertensive gastropathy. No other gross lesions in the stomach. Biopsied. - Non-bleeding duodenal ulcer with a clean ulcer base ([place] Class III). Biopsied. Clip (MR conditional) was placed. - No gross lesions in the D1/D2 sweep and in the second portion of the duodenum. Biopsied.   ASSESSMENT  Ms. Thalman is a 68 y.o. female with a pmh significant for hypertension, COPD, rheumatoid arthritis, asthma, Necrotizing glomerulonephritis, chronic renal insufficiency previous CMV colitis, previous HSV esophagitis, readmitted with weight loss, diarrhea and concern for potential underlying cirrhosis.  GI reconsulted for hypoalbuminemia and persistent ascites and endoscopy performed.  Overall, patient remains  stable clinically and hemodynamically.  She is no worse than yesterday.  Liver Doppler ultrasound did not see flow in the main portal vein but it had been seen previously within the last 2 weeks.  I would normally want to perform a cross-sectional imaging study such as a CT angiography or MR angiography however with her creatinine continued to be an issue I feel a bit concerned about that.  I will let us see how she does over the course the next 1 to 2 days and potentially on Tuesday consider the role of a repeat liver Doppler ultrasound.  As we optimize her nutrition with a core track that will hopefully be placed on Monday, maybe we are able to get her to a point where we can do a diagnostic tap as well and calculate a true SAAG.  I do think the patient does have evidence of what looks to be portal gastropathy and her liver Doppler ultrasound findings suggesting some systemic collateralization suggest that underlying cirrhosis may be playing a role.  I think the hard part with her moving forward will be what we do with her volume status.  We may need nephrology to become reinvolved in her case.  I am sending a urine sodium today to evaluate for the possibility of  an HRS like phenomena.  Her biopsies are pending from Friday's endoscopy.  I think unlikely that we will end up finding viral cytopathologic effect however.  We will hold on a diagnostic tap for now.  All patient questions were answered, to the best of my ability, and the patient agrees to the aforementioned plan of action with follow-up as indicated.   PLAN/RECOMMENDATIONS  Urine sodium to be sent today Depending on patient's creatinine in the next few days, after poor tract has been placed and nutrition has been optimized to repeat a small volume tap and evaluate for SAAG On Tuesday consider a repeat liver Doppler ultrasound unless creatinine is significantly better and then cross-sectional CT angiography could be considered to ensure portal vein  remains intact.  Is not noted on Saturdays ultrasound Doppler Follow-up biopsies from endoscopy Will need to likely consider nephrology reconsultation for management from a diuretic perspective versus intermittent low volume taps for the patient   Please page/call with questions or concerns.   Justice Britain, MD [facility] Office # [phone]    LOS: 61 days  Irving Copas  01/23/2019, 8:59 AM

## 2019-01-23 NOTE — Progress Notes (Signed)
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  She was admitted for blood in stool and chronic diarrhea and abnormal liver function. She's now s/p colonoscopy with GI  Assessment & Plan:   Principal Problem:   ARF (acute renal failure) (HCC) Active Problems:   Rheumatoid arthritis involving multiple sites with positive rheumatoid factor (HCC)   History of hypertension   History of hypothyroidism   Abnormal liver function   Diarrhea   Pulmonary nodule   Metabolic acidosis   Severe protein-calorie malnutrition (HCC)   Rectal bleeding   LFTs abnormal   Hepatic cirrhosis (HCC)   Pancreatic insufficiency   Ascites  Blood in stool, colitis - GI was consulted, appreciate recommendations - Colonoscopy was performed with findings of localized inflammation at ileocecal valve, external and internal hemorrhoids - see report -CMV found to be negative - No report of further bleeding noted at this time  Abnormal liver function  - GGT (elevated) - Check ferritin (elevated), iron (wnl), tibc (not calc), ceruloplasmin (wnl), alpha 1 antitripsin (wnl), ana (negative), antismooth muscle ab (wnl), check anti mitochondrial antibody (wnl) - Check MRCP ->with severe diffuse hepatic steatosis (can't exclude cirrhosis) - solitary subcentimeter R liver lesion, likely benign cyst ->recommend f/u MRI with and without IV contrast in 3-6 months -question if secondary to methotrexate [place] vs fatty liver vs other  per GI - GI has recommended outpatient for further work up and biopsy, although possibility for in-house biopsy was noted by GI  Chronic diarrhea - gliadin antibodies - wnl, TTG - normal, - Cholestyramine prn - Given trial of Creon - Initially resolved with colestid BID, imodium, and creon     - Colestid stopped 7/31 as it may contribute to nausea, also to get a better assessment of diarrhea     -seems stable at this time  Community Acquired Pneumonia  Fever:  - Bld Cx Neg - CXR with patchy airspace dz in L lung base (early pneumonia vs sequelae of aspiration) - Repeat 2 view CXR with small bilateral effusions and associated atelectasis vs consolidation - Completed course of Ceftriaxone/azithro x 5 days    - Remains afebrile  AKI on CKD III - creatinine worsened in setting of soft BP's on 7/18.  - baseline creatinine appears to be ~1.5, continue to monitor. - No hydro on imaging from earlier in hospitalization      -Diuretics on stopped. Mucus membranes appear dry.      - Currently on gentle IVF hydration     -Cr peaked to 2.8, currently 2.7. Receiving gentle IVF as tolerated     -Repeat bmet in AM. If renal function continues to worsen, consider Nephrology consultation  Right Greater than Left Effusions  Moderate Volume Ascites:  -s/p paracentesis w/ 4.5L removed    - Currently remains on minimal O2 support       - Per GI, possible repeat US in 2-3 days pending improvement in renal function  Hypotension/Hx of Hypertension - Continue holding antihypertensives for now     - BP currently  Non- AG acidosis - Improved, d/c IV bicarb - Pt on PO  bicarb; acidosis resolved, presently stable  N/V - Zofran 4mg  iv q6h prn - improved after recent paracentesis     -Colestid stopped per above as it may contribute to nausea    - Currently stable at this time  Hypothyroidism - Cont Levothyroxine  88 micrograms po qday as pt tolerates  Anxiety - Cont Lexapro 10mg  po qday as tolerated      -Presently stable  Normocytic Anemia - stable, no frank bleed noted, monitor    -  Will follow CBC trends   RA - Off Humira, follows with Deveshwar - Will continue with Voltaren gel as needed  Pulmonary Nodule - Recommended outpatient follow up  Right greater than Left LEE - RLE noted to have no DVT.  Hypoglycemia - Glucose currently stable and w/in normal range - Cont to follow glucose trends  Hypomagnesemia/Hypophospatemia - replaced  Debility - PT recs SNF; Pt initially agreed with SNF      - Later decided on going home, however this AM, pt stated she realized she would be a burden on her family, thus again agrees to SNF   Toxic metabolic encephalopathy - recent lethargy, however mentation currently much improved -appropriately conversant this AM, resolved  Pitting edema - BLE edema and signs of volume overload -2d echo reviewed, normal LVEF -Given trial of lasix with some improvement. Lasix stopped given concerns of intravascular depletion and dehydration -Suspect anasarca related to marked malnutrition -GI recommendation for Cortrak, orders placed, pending -Continue diet as tolerated  Severe protein calorie malnutrition -anorexia for several months noted -Likely contributing to anasarca above -Planning for tube placement to initiate tube feeding -Cont to encourage po intake  DVT prophylaxis: SCD's Code Status: Full Family Communication: Pt in room, family not at bedside Disposition Plan: SNF when medically stable  Consultants:   GI  IR  Procedures:   Colonoscopy 7/19  EGD 7/31  Antimicrobials: Anti-infectives (From admission, onward)   Start     Dose/Rate Route Frequency Ordered Stop   01/07/19 1700  cefTRIAXone (ROCEPHIN) 2 g in sodium chloride 0.9 % 100 mL IVPB     2 g 200 mL/hr over 30 Minutes  Intravenous Every 24 hours 01/07/19 1557 01/11/19 1908   01/07/19 1700  azithromycin (ZITHROMAX) 500 mg in sodium chloride 0.9 % 250 mL IVPB     500 mg 250 mL/hr over 60 Minutes Intravenous Every 24 hours 01/07/19 1557 01/11/19 2014      Subjective: Accidentally missed breakfast this AM, but pt did plan on eating this AM  Objective: Vitals:   01/21/19 2107 01/22/19 0501 01/22/19 2038 01/23/19 0604  BP: 122/66 116/71 111/67 106/61  Pulse: 75 90 96 83  Resp: 16 16 16 16   Temp: 98 F (36.7 C) 98.1 F (36.7 C) 98.7 F (37.1 C) 98.2 F (36.8 C)  TempSrc: Oral Oral Oral Oral  SpO2: 96% 95% 96% 96%  Weight:  73 kg  73.8 kg  Height:        Intake/Output Summary (Last 24 hours) at 01/23/2019 1544 Last data filed at 01/23/2019 0900 Gross per 24 hour  Intake -  Output 200 ml  Net -200 ml   Filed Weights   01/21/19 0551 01/22/19 0501 01/23/19 0604  Weight: 73 kg 73 kg 73.8 kg    Examination: General exam: Conversant, in no acute distress Respiratory system: normal chest rise, clear, no audible wheezing Cardiovascular system: regular rhythm, s1-s2 Gastrointestinal system: Nondistended, nontender, pos BS Central nervous system: No seizures, no tremors Extremities: No  cyanosis, no joint deformities, BLE 2+ pitting edema Skin: No rashes, no pallor Psychiatry: Affect normal // no auditory hallucinations   Data Reviewed: I have personally reviewed following labs and imaging studies  CBC: Recent Labs  Lab 01/22/19 0348  WBC 14.2*  HGB 8.8*  HCT 28.0*  MCV 100.0  PLT 932   Basic Metabolic Panel: Recent Labs  Lab 01/20/19 0533 01/21/19 0517 01/22/19 0348 01/23/19 0455  NA 140 138 139 139  K 4.4 4.5 4.5 4.2  CL 107 106 107 108  CO2 23 25 26 22   GLUCOSE 86 84 119* 98  BUN 40* 46* 55* 56*  CREATININE 2.63* 2.73* 2.83* 2.73*  CALCIUM 8.2* 8.1* 7.9* 7.7*   GFR: Estimated Creatinine Clearance: 18.4 mL/min (A) (by C-G formula based on SCr of 2.73 mg/dL (H)). Liver  Function Tests: Recent Labs  Lab 01/20/19 0533 01/21/19 0517 01/22/19 0348 01/23/19 0455  AST 62* 57* 57* 59*  ALT 18 13 15 15   ALKPHOS 231* 191* 177* 187*  BILITOT 3.2* 3.2* 2.7* 2.0*  PROT 5.3* 4.8* 4.8* 4.6*  ALBUMIN 2.0* 1.7* 1.7* 1.7*   No results for input(s): LIPASE, AMYLASE in the last 168 hours. No results for input(s): AMMONIA in the last 168 hours. Coagulation Profile: Recent Labs  Lab 01/21/19 0517  INR 1.2   Cardiac Enzymes: No results for input(s): CKTOTAL, CKMB, CKMBINDEX, TROPONINI in the last 168 hours. BNP (last 3 results) No results for input(s): PROBNP in the last 8760 hours. HbA1C: No results for input(s): HGBA1C in the last 72 hours. CBG: Recent Labs  Lab 01/22/19 0730 01/22/19 1232 01/22/19 1634 01/23/19 0810 01/23/19 1146  GLUCAP 116* 109* 105* 88 77   Lipid Profile: No results for input(s): CHOL, HDL, LDLCALC, TRIG, CHOLHDL, LDLDIRECT in the last 72 hours. Thyroid Function Tests: No results for input(s): TSH, T4TOTAL, FREET4, T3FREE, THYROIDAB in the last 72 hours. Anemia Panel: No results for input(s): VITAMINB12, FOLATE, FERRITIN, TIBC, IRON, RETICCTPCT in the last 72 hours. Sepsis Labs: No results for input(s): PROCALCITON, LATICACIDVEN in the last 168 hours.  Recent Results (from the past 240 hour(s))  Novel Coronavirus, NAA (hospital order; send-out to ref lab)     Status: None   Collection Time: 01/14/19  2:00 PM   Specimen: Nasopharyngeal Swab; Respiratory  Result Value Ref Range Status   SARS-CoV-2, NAA NOT DETECTED NOT DETECTED Final    Comment: (NOTE) This test was developed and its performance characteristics determined by [employer]. This test has not been FDA cleared or approved. This test has been authorized by FDA under an Emergency Use Authorization (EUA). This test is only authorized for the duration of time the declaration that circumstances exist justifying the authorization of the emergency use of in  vitro diagnostic tests for detection of SARS-CoV-2 virus and/or diagnosis of COVID-19 infection under section 564(b)(1) of the Act, 21 U.S.C. 671IWP-8(K)(9), unless the authorization is terminated or revoked sooner. When diagnostic testing is negative, the possibility of a false negative result should be considered in the context of a patient's recent exposures and the presence of clinical signs and symptoms consistent with COVID-19. An individual without symptoms of COVID-19 and who is not shedding SARS-CoV-2 virus would expect to have a negative (not detected) result in this assay. Performed  At: [facility] [place], [place] [id number] Rush Farmer MD LZ:[id number]    [place]  Final    Comment: Performed at [facility] [address]  [place], [place] [address]  Novel Coronavirus, NAA (hospital order; send-out to ref lab)     Status: None   Collection Time: 01/17/19  7:37 PM   Specimen: Nasopharyngeal Swab; Respiratory  Result Value Ref Range Status   SARS-CoV-2, NAA NOT DETECTED NOT DETECTED Final    Comment: (NOTE) This test was developed and its performance characteristics determined by [employer]. This test has not been FDA cleared or approved. This test has been authorized by FDA under an Emergency Use Authorization (EUA). This test is only authorized for the duration of time the declaration that circumstances exist justifying the authorization of the emergency use of in vitro diagnostic tests for detection of SARS-CoV-2 virus and/or diagnosis of COVID-19 infection under section 564(b)(1) of the Act, 21 U.S.C. 163WGY-6(Z)(9), unless the authorization is terminated or revoked sooner. When diagnostic testing is negative, the possibility of a false negative result should be considered in the context of a patient's recent exposures and the presence of clinical signs and symptoms consistent with  COVID-19. An individual without symptoms of COVID-19 and who is not shedding SARS-CoV-2 virus would expect to have a negative (not detected) result in this assay. Performed  At: [facility] [place], [place] [id number] Rush Farmer MD TJ:[id number]    [place]  Final    Comment: Performed at [facility] [address], [place], [place] [address]     Radiology Studies: US Liver Doppler  Result Date: 01/22/2019 CLINICAL DATA:  Abnormal LFTs, fatty liver on MR, nodular liver contour suggesting possible cirrhosis EXAM: DUPLEX ULTRASOUND OF LIVER TECHNIQUE: Color and duplex Doppler ultrasound was performed to evaluate the hepatic in-flow and out-flow vessels. Technologist describes technically difficult study secondary to patient intolerance and steatohepatitis. COMPARISON:  MR 01/07/2019, CT 01/06/2019 FINDINGS: Portal Vein 0.5 cm diameter.  Velocities: Main: None detected Right:  11.3 cm/sec retrograde (hepatofugal) Left:  16.4 cm/sec antegrade (hepatopetal) Hepatic Vein Velocities (all hepatofugal): Right: Not recorded Middle:  35 cm/sec Left:  21 cm/sec IVC: 42 cm/sec Hepatic Artery Velocity:  131 cm/sec Spleen 5.5 x 7.4 x 2.7 cm (volume = 58 cm^3). Splenic Vein: No evidence of occlusion or thrombus. Velocity: 19 cm/sec Varices: Paraumbilical vein identified Ascites: Moderate IMPRESSION: 1. Limited study, with no flow confirmed in the main portal vein; portal vein patency had been demonstrated on MR 01/07/2019. There is retrograde right portal vein flow, antegrade flow in the left portal vein and recanalized paraumbilical portosystemic collateral. 2. Moderate abdominal ascites Electronically Signed   By: Lucrezia Europe M.D.   On: 01/22/2019 10:14    Scheduled Meds: . budesonide  9 mg Oral Daily  . calcium carbonate  500 mg of elemental calcium Oral BID   And  . cholecalciferol  200 Units Oral BID  . escitalopram  10 mg Oral Daily   . feeding supplement (ENSURE ENLIVE)  237 mL Oral Q24H  . feeding supplement (PRO-STAT SUGAR FREE 64)  30 mL Oral TID BM  . levothyroxine  88 mcg Oral Q0600  . magnesium oxide  400 mg Oral BID  . multivitamin with minerals  1 tablet Oral Daily  . ondansetron  4 mg Intravenous Q8H  . pantoprazole  40 mg Oral BID  . sodium bicarbonate  650 mg Oral BID   Continuous Infusions: . dextrose 5 % and 0.9% NaCl 50 mL/hr at 01/23/19 1402     LOS: 16 days   Marylu Lund, MD [facility] Pager On Amion  If 7PM-7AM,  please contact night-coverage 01/23/2019, 3:44 PM

## 2019-01-23 NOTE — Progress Notes (Signed)
ICU CN contacted regarding placement of Cotrak Tube placement. CN will attempt close to 1500 today. Will place follow up call.

## 2019-01-24 ENCOUNTER — Encounter (HOSPITAL_COMMUNITY): Payer: Self-pay | Admitting: Gastroenterology

## 2019-01-24 DIAGNOSIS — Z0189 Encounter for other specified special examinations: Secondary | ICD-10-CM

## 2019-01-24 DIAGNOSIS — N179 Acute kidney failure, unspecified: Secondary | ICD-10-CM

## 2019-01-24 LAB — GLUCOSE, CAPILLARY
Glucose-Capillary: 82 mg/dL (ref 70–99)
Glucose-Capillary: 108 mg/dL — ABNORMAL HIGH (ref 70–99)
Glucose-Capillary: 100 mg/dL — ABNORMAL HIGH (ref 70–99)
Glucose-Capillary: 123 mg/dL — ABNORMAL HIGH (ref 70–99)

## 2019-01-24 LAB — COMPREHENSIVE METABOLIC PANEL
ALT: 17 U/L (ref 0–44)
AST: 65 U/L — ABNORMAL HIGH (ref 15–41)
Albumin: 1.6 g/dL — ABNORMAL LOW (ref 3.5–5.0)
Alkaline Phosphatase: 205 U/L — ABNORMAL HIGH (ref 38–126)
Anion gap: 7 (ref 5–15)
BUN: 59 mg/dL — ABNORMAL HIGH (ref 8–23)
CO2: 24 mmol/L (ref 22–32)
Calcium: 7.8 mg/dL — ABNORMAL LOW (ref 8.9–10.3)
Chloride: 110 mmol/L (ref 98–111)
Creatinine, Ser: 2.78 mg/dL — ABNORMAL HIGH (ref 0.44–1.00)
GFR calc Af Amer: 20 mL/min — ABNORMAL LOW (ref >60)
GFR calc non Af Amer: 17 mL/min — ABNORMAL LOW (ref >60)
Glucose, Bld: 95 mg/dL (ref 70–99)
Potassium: 4.1 mmol/L (ref 3.5–5.1)
Sodium: 141 mmol/L (ref 135–145)
Total Bilirubin: 1.9 mg/dL — ABNORMAL HIGH (ref 0.3–1.2)
Total Protein: 4.6 g/dL — ABNORMAL LOW (ref 6.5–8.1)

## 2019-01-24 LAB — MAGNESIUM: Magnesium: 2 mg/dL (ref 1.7–2.4)

## 2019-01-24 LAB — CBC
HCT: 28.2 % — ABNORMAL LOW (ref 36.0–46.0)
Hemoglobin: 8.7 g/dL — ABNORMAL LOW (ref 12.0–15.0)
MCH: 30.9 pg (ref 26.0–34.0)
MCHC: 30.9 g/dL (ref 30.0–36.0)
MCV: 100 fL (ref 80.0–100.0)
Platelets: 155 10*3/uL (ref 150–400)
RBC: 2.82 MIL/uL — ABNORMAL LOW (ref 3.87–5.11)
RDW: 20.8 % — ABNORMAL HIGH (ref 11.5–15.5)
WBC: 8.1 10*3/uL (ref 4.0–10.5)
nRBC: 0 % (ref 0.0–0.2)

## 2019-01-24 LAB — PHOSPHORUS: Phosphorus: 3.2 mg/dL (ref 2.5–4.6)

## 2019-01-24 LAB — HEMOGLOBIN A1C
Hgb A1c MFr Bld: 4.1 % — ABNORMAL LOW (ref 4.8–5.6)
Mean Plasma Glucose: 70.97 mg/dL

## 2019-01-24 LAB — CREATININE, URINE, RANDOM: Creatinine, Urine: 101.79 mg/dL

## 2019-01-24 LAB — SODIUM, URINE, RANDOM: Sodium, Ur: 41 mmol/L

## 2019-01-24 MED ORDER — VITAL HIGH PROTEIN PO LIQD
1000.0000 mL | ORAL | Status: DC
  Filled 2019-01-24: qty 1000

## 2019-01-24 MED ORDER — FUROSEMIDE 10 MG/ML IJ SOLN
40.0000 mg | Freq: Three times a day (TID) | INTRAMUSCULAR | Status: DC
  Administered 2019-01-24 – 2019-01-25 (×3): 40 mg via INTRAVENOUS
  Filled 2019-01-24 (×3): qty 4

## 2019-01-24 MED ORDER — ALBUMIN HUMAN 25 % IV SOLN
50.0000 g | Freq: Two times a day (BID) | INTRAVENOUS | Status: AC
  Administered 2019-01-24 – 2019-01-25 (×3): 50 g via INTRAVENOUS
  Administered 2019-01-26 (×4): 12.5 g via INTRAVENOUS
  Filled 2019-01-24 (×4): qty 200

## 2019-01-24 MED ORDER — PRO-STAT SUGAR FREE PO LIQD
30.0000 mL | Freq: Two times a day (BID) | ORAL | Status: DC
  Administered 2019-01-24 – 2019-01-26 (×2): 30 mL
  Filled 2019-01-24 (×4): qty 30

## 2019-01-24 MED ORDER — INSULIN ASPART 100 UNIT/ML ~~LOC~~ SOLN
0.0000 [IU] | SUBCUTANEOUS | Status: DC
  Administered 2019-01-26: 1 [IU] via SUBCUTANEOUS

## 2019-01-24 MED ORDER — LORAZEPAM 2 MG/ML IJ SOLN
0.5000 mg | Freq: Once | INTRAMUSCULAR | Status: AC
  Administered 2019-01-25: 0.5 mg via INTRAVENOUS
  Filled 2019-01-24: qty 1

## 2019-01-24 MED ORDER — JEVITY 1.5 CAL/FIBER PO LIQD
1000.0000 mL | ORAL | Status: DC
  Administered 2019-01-24: 1000 mL
  Filled 2019-01-24 (×3): qty 1000

## 2019-01-24 NOTE — Progress Notes (Signed)
Upon report during shift change, it was noted that patients feeding tube was pulled out and lying on her bed. Pt is unsure of what happened, assisted with cleaning pt up and feeding pump turned off. Dr. Hilarie Fredrickson entered room shortly after and was made aware of situation. He requested that the tube be replaced with patients permission. ICU Charge nurse, Darrick Meigs, called to request replacement of tube. He stated he would inform [place]ia, Therapist, sports, the nightshift charge nurse of the need for tube being placed tonight. The patients nightshift RN, Alver Fisher, given report on situation. Pts husband, Timmothy Sours, called and made aware per patient request.

## 2019-01-24 NOTE — Progress Notes (Addendum)
Nutrition Follow-up  RD working remotely.   DOCUMENTATION CODES:   Not applicable  INTERVENTION:  - will order TF: Jevity 1.5 @ 20 ml/hr to advance by 10 ml every 12 hours to reach goal rate of 50 ml/hr with 30 ml prostat BID and 100 ml free water QID. - at goal rate, this regimen will provide 2000 kcal, 106 gras protein, and 1312 ml free water.  Monitor magnesium, potassium, and phosphorus daily for at least 3 days, MD to replete as needed, as pt is at risk for refeeding syndrome given prolonged period of inadequate nutrition.    NUTRITION DIAGNOSIS:   Inadequate oral intake related to acute illness, decreased appetite as evidenced by per patient/family report, meal completion < 50%. -ongoing  GOAL:   Patient will meet greater than or equal to 90% of their needs -unmet  MONITOR:   TF tolerance, Labs, Weight trends  REASON FOR ASSESSMENT:   Consult Enteral/tube feeding initiation and management  ASSESSMENT:   68 y.o. female with medical history of HTN, hyperlipidemia, asthma, rheumatoid arthritis, CMV colitis, HSV esophagitis in 07/2018, h/o necrotizing glomerulonephritis/vasculitis(+ ANCA), and pancytopenia secondary to cyclophosphamide. She presented to the ED with complaints of chronic diarrhea and intermittent N/V. She noted some blood in the stool and alerted outpatient GI provider who encouraged her to present to the ED. She had an EGD/colonoscopy in 07/2018 which showed an ulcerated mass-like lesion in the ileocecal valve with satellite ulcers, no malignancy noted, did show esophagitis and gastritis. She was admitted for c/o rectal bleeding, chronic diarrhea, non-AG acidosis secondary to diarrhea, and abnormal liver function.  Per RN flow sheet, patient consumed 50% of dinner on 7/29; 25% of breakfast and 0% of lunch and dinner on 7/30; 0% of breakfast and dinner on 7/31; 20% of lunch on 8/2; 0% of breakfast today.   Small bore NGT was placed yesterday afternoon. Will  order TF as outlined above. Per chart review, current weight is +9.1 kg/20 lb compared to weight on 7/17. Estimated nutrition needs remain appropriate. RN flow sheet indicates moderate edema to all extremities.   Per notes: - colitis, internal and external hemorrhoids - abnormal liver function s/p MRCP which showed severe diffuse hepatic steatosis and recommendation for MRI in 3-6 months - chronic diarrhea--stable - CAP - AKI on CKD stage 3 - ascites s/p paracentesis with 4.5L removed - acidosis--resolved  - N/V--improved after paracentesis - pulmonary nodule--recommendation for outpatient follow-up - debility - toxic metabolic encephalopathy--resolved   Labs reviewed; CBGs: 82 and 102 mg/dl today, BUN: 59 mg/dl, creatinine: 2.78 mg/dl, Ca: 7.8 mg/dl, Alk Phos elevated, AST slightly elevated, GFR: 20 ml/min.  Medications reviewed; 200 units vitamin D3 BID, 88 mcg oral synthroid/day, 400 mg mag-ox BID, daily multivitamin with minerals, 650 mg oral sodium bicarb BID. IVF; D5-NS @ 50 ml/hr (204 kcal).     NUTRITION - FOCUSED PHYSICAL EXAM:  unable to complete at this time.   Diet Order:   Diet Order            Diet 2 gram sodium Room service appropriate? Yes; Fluid consistency: Thin  Diet effective now              EDUCATION NEEDS:   Not appropriate for education at this time  Skin:  Skin Assessment: Reviewed RN Assessment  Last BM:  8/2  Height:   Ht Readings from Last 1 Encounters:  01/09/19 5' 1" (1.549 m)    Weight:   Wt Readings from Last 1 Encounters:  01/24/19 76.4 kg    Ideal Body Weight:  47.7 kg  BMI:  Body mass index is 31.82 kg/m.  Estimated Nutritional Needs:   Kcal:  2000-2200 kcal  Protein:  100-115 grams  Fluid:  >/= 2.5 L/day     Jarome Matin, MS, RD, LDN, [facility] Inpatient Clinical Dietitian Pager # [phone] After hours/weekend pager # [phone]

## 2019-01-24 NOTE — Consult Note (Signed)
Renal Service Consult Note [facility] Kaylee Rasmussen 01/24/2019 Sol [place]azing Requesting Physician:  Dr Wyline Copas  Reason for Consult:  Anasarca/ cirrhosis w/ renal failure HPI: The patient is a 68 y.o. 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 She had sig debility following this but was getting better according her neph MD Dr Myrtie Cruise at [facility].  Complicated PMH w/ RA on methotrexate for years, hx of necrotizing GN, HL, HTN, asthma. Patient was then admitted 7/16  for abnormal LFT's w/ diarrhea and blood in stool, see below. New ascites prompted large vol tap. CAP rx'd with IV abx. AMS imrpoved w/ lactulose. Creat has risen from 1.6 > 2.7 as inpatient.  Got some IV lasix, now dc'd.  Has anasarca, low UNa. Asked to see for renal failure.    In Jan 2020 pt was admitted to [facility] w/ esophagitis, bx + for HSV. Colonoscopy showed ulcerated masslike lesion w/ bx + for CMV, she was rx'd for both infections.     Date  Creat  eGFR  2017- 18 1.2- 1.5  2019  1.1- 2.4  12/30/18  1.52  01/06/19 1.66  32  01/15/29 2.12  01/20/19 2.73  18  01/24/19  2.78  17   ROS  denies CP  no joint pain   no HA  no blurry vision  no rash  no diarrhea  no nausea/ vomiting  no dysuria  no difficulty voiding  no change in urine color    Past Medical History  Past Medical History:  Diagnosis Date  . Asthma   . COPD (chronic obstructive pulmonary disease) ([place]) 07/13/2018   on CT chest at [facility]  . Esophagitis, CMV (cytomegalovirus) ([place]) 07/08/2018  . Herpes simplex esophagitis 07/08/2018  . High cholesterol   . Hypertension   . Pancytopenia ([place])    secondary to cyclophosphamide  . Rheumatoid arthritis ([place])   . Severe protein-calorie malnutrition ([place]) 01/08/2019  . Vertigo    Past Surgical History  Past Surgical History:  Procedure Laterality Date  . ABDOMINAL  HYSTERECTOMY    . BIOPSY  01/09/2019   Procedure: BIOPSY;  Surgeon: Gatha Mayer, MD;  Location: [facility];  Service: Endoscopy;;  . BIOPSY  01/21/2019   Procedure: BIOPSY;  Surgeon: Irving Copas., MD;  Location: [facility];  Service: Gastroenterology;;  . COLONOSCOPY WITH PROPOFOL N/A 01/09/2019   Procedure: COLONOSCOPY WITH PROPOFOL;  Surgeon: Gatha Mayer, MD;  Location: [facility];  Service: Endoscopy;  Laterality: N/A;  . ESOPHAGOGASTRODUODENOSCOPY (EGD) WITH PROPOFOL N/A 01/21/2019   Procedure: ESOPHAGOGASTRODUODENOSCOPY (EGD) WITH PROPOFOL;  Surgeon: Rush Landmark Telford Nab., MD;  Location: [facility];  Service: Gastroenterology;  Laterality: N/A;  . HEMOSTASIS CLIP PLACEMENT  01/21/2019   Procedure: HEMOSTASIS CLIP PLACEMENT;  Surgeon: Irving Copas., MD;  Location: [facility];  Service: Gastroenterology;;  . HIP ARTHROPLASTY    . Lumbarectomy    . RENAL BIOPSY  03/24/2018   Family History  Family History  Problem Relation Age of Onset  . Diabetes Mother   . Heart attack Mother   . Heart attack Father   . Breast cancer Maternal Aunt   . Colon cancer Neg Hx   . Esophageal cancer Neg Hx    Social History  reports that she has never smoked. She has never used smokeless tobacco. She reports that she does not drink alcohol or use drugs. Allergies  Allergies  Allergen Reactions  . Cozaar [Losartan Potassium]   . Scallops [Shellfish Allergy] Rash   Home medications Prior to Admission medications   Medication Sig Start Date End Date Taking? Authorizing Provider  amLODipine (NORVASC) 5 MG tablet Take 2.5 mg by mouth at bedtime.   Yes [provider]  Calcium Carbonate-Vitamin D (CALCIUM 600+D PO) Take 1 tablet by mouth 2 (two) times daily.   Yes [provider]  escitalopram (LEXAPRO) 10 MG tablet Take 10 mg by mouth daily. 03/23/17  Yes [provider]  levothyroxine (SYNTHROID, LEVOTHROID) 88 MCG tablet Take 88 mcg by  mouth daily before breakfast.  03/12/17  Yes [provider]  metoprolol succinate (TOPROL-XL) 100 MG 24 hr tablet Take 100 mg by mouth daily. 03/24/17  Yes [provider]  potassium chloride (MICRO-K) 10 MEQ CR capsule Take 10 mEq by mouth daily.   Yes [provider]  PROAIR HFA 108 (90 Base) MCG/ACT inhaler Inhale 1-2 puffs into the lungs every 4 (four) hours as needed for wheezing.  05/05/16  Yes [provider]  SODIUM BICARBONATE PO Take by mouth 2 (two) times daily. 10 gains twice  daily   Yes [provider]  colestipol (COLESTID) 1 g tablet Take 1 tablet (1 g total) by mouth 2 (two) times daily. 01/18/19 02/17/19  Cherylann Ratel A, DO  diclofenac sodium (VOLTAREN) 1 % GEL Apply 4 g topically 3 (three) times daily as needed for up to 7 days (arthritis pain). 01/18/19 01/25/19  Cherylann Ratel A, DO  dicyclomine (BENTYL) 10 MG capsule Take 1 capsule (10 mg total) by mouth 2 (two) times a day. Patient not taking: Reported on 01/06/2019 12/17/18   Jackquline Denmark, MD  lactulose (CHRONULAC) 10 GM/15ML solution Take 15 mLs (10 g total) by mouth 3 (three) times daily. 01/18/19 02/17/19  Cherylann Ratel A, DO  lipase/protease/amylase (CREON) 36000 UNITS CPEP capsule Take 1 capsule (36,000 Units total) by mouth 3 (three) times daily before meals. 01/18/19 03/19/19  Cherylann Ratel A, DO  magnesium oxide (MAG-OX) 400 (241.3 Mg) MG tablet Take 1 tablet (400 mg total) by mouth 2 (two) times daily. 01/18/19 02/17/19  Cherylann Ratel A, DO  metoCLOPramide (REGLAN) 5 MG tablet Take 1 tablet (5 mg total) by mouth 2 (two) times a day for 14 days. half an hour before meals x 2 weeks for now 01/05/19 01/19/19  Jackquline Denmark, MD  ondansetron (ZOFRAN ODT) 4 MG disintegrating tablet Take 1 tablet (4 mg total) by mouth every 6 (six) hours as needed for nausea or vomiting. Patient not taking: Reported on 11/17/2018 10/28/18   Jackquline Denmark, MD  prochlorperazine (COMPAZINE) 10 MG tablet Take 1 tablet (10 mg  total) by mouth 2 (two) times a day. Patient not taking: Reported on 01/06/2019 12/15/18   Jackquline Denmark, MD  sodium bicarbonate 650 MG tablet Take 1 tablet (650 mg total) by mouth 2 (two) times daily. 01/18/19 02/17/19  Jonnie Finner, DO   Liver Function Tests Recent Labs  Lab 01/22/19 [phone] 01/23/19 0455 01/24/19 0350  AST 57* 59* 65*  ALT '15 15 17  '$ ALKPHOS 177* 187* 205*  BILITOT 2.7* 2.0* 1.9*  PROT 4.8* 4.6* 4.6*  ALBUMIN 1.7* 1.7* 1.6*   No results for input(s): LIPASE, AMYLASE in the last 168 hours. CBC Recent Labs  Lab 01/22/19 0348 01/24/19 0350  WBC 14.2* 8.1  HGB 8.8* 8.7*  HCT 28.0* 28.2*  MCV 100.0 100.0  PLT 159 155   Basic  Metabolic Panel Recent Labs  Lab 01/20/19 0533 01/21/19 0517 01/22/19 0348 01/23/19 0455 01/24/19 0350  NA 140 138 139 139 141  K 4.4 4.5 4.5 4.2 4.1  CL 107 106 107 108 110  CO2 '23 25 26 22 24  '$ GLUCOSE 86 84 119* 98 95  BUN 40* 46* 55* 56* 59*  CREATININE 2.63* 2.73* 2.83* 2.73* 2.78*  CALCIUM 8.2* 8.1* 7.9* 7.7* 7.8*   Iron/TIBC/Ferritin/ %Sat    Component Value Date/Time   IRON 81 01/07/2019 0928   TIBC NOT CALCULATED 01/07/2019 0928   FERRITIN 498 (H) 01/07/2019 0928   IRONPCTSAT NOT CALCULATED 01/07/2019 0928    Vitals:   01/23/19 1700 01/23/19 2208 01/24/19 0500 01/24/19 0513  BP: 117/67 117/65  120/82  Pulse: 92 85  85  Resp: '16 16  14  '$ Temp: 98.2 F (36.8 C) 98.2 F (36.8 C)  97.8 F (36.6 C)  TempSrc: Oral Oral  Oral  SpO2: 91% 95%  96%  Weight:   76.4 kg   Height:        Exam Gen alert, chron ill appearing No rash, cyanosis or gangrene Sclera anicteric, throat clear  No jvd or bruits Chest clear bilat except some dec'd BS L base RRR no MRG Abd soft ntnd 2-3+ ascites diffusely GU defer MS no joint effusions or deformity Ext 2-3+ diffuse ext edema, no wounds or ulcer Neuro is alert, Ox 3 , nf , no asterixis    Home meds:  - amlodipine 2.5/ metoprolol xl 100 qd  - escitalopram 10 qd  - sod  bicarb bid/ Kdur 10 qd/ proair hfa prn/ levothyroxine 88 qd  - prn's/ vitamins/ supplements   CT abd /pelvis 01/06/19 > 1. Abnormal appearance of the liver with heterogeneous hepatic steatosis and heterogeneous liver enhancement. Questionable fine liver surface irregularity. Cannot exclude hepatic cirrhosis. Nodiscrete liver masses. Recommend correlation with liver enzymes. 2. Third-spacing of fluid with moderate ascites, small dependent bilateral pleural effusions, right greater than left, and mild anasarca. 3. No evidence of bowel obstruction or acute bowel inflammation. Mild colonic diverticulosis, without convincing findings of acute diverticulitis. 4. Patchy bandlike consolidation in the left lung base, cannot exclude pneumonia or aspiration.    UA 7/21 > negative  UNa 8/2 - <10  Albumin 1.6   Tbili 1.9  Total prot 4.6  Ca 7.8  K 4.1  Na 141  BUn 59  Cr 2.78  I/O + 5.4 and - 480 since admit, +4.9 L net.   Wt's are up 70kg > 76kg since admit  Assessment/ Plan: 1. 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  BP's are not low which is unusual for HRS however.  She is quite vol overloaded.  Only thing to offer would be IV albumin w/ lasix. Renal function may worsen w/ lasix in this setting but nothing else to offer.  See orders.   2. CKD 3/4 - this is likely due to her known hx of ANCA+ GN treated lasts year. Get renal US, kidneys look small on CT.  3. Ascites/ fatty liver - possible cirrhosis picture, GI is following 4. Vol overload - as above, very low alb w/ 3rd spacing    Kelly Splinter  MD 01/24/2019, 3:42 PM

## 2019-01-24 NOTE — Progress Notes (Signed)
PROGRESS NOTE    Kaylee Rasmussen  SAY:[id number] DOB: April 17, 1951 DOA: 01/06/2019 PCP: Nico[facility], MD    Brief Narrative:  68 y.o.female,hypertension, hyperlipidemia, Asthma, Rheumatoid arthritis (formerly on humira->CMV colitis, HSV esophagitis February 2020), h/o necrotizing glomerulonephritis/ vasculitis(+ ANCA), pancytopenia secondary to cyclophosphamide , has c/o chronic diarrhea and some intermittent n/v, and apparently noticed some blood in stool and told to come to ER for evaluation by GI.  She was admitted for blood in stool and chronic diarrhea and abnormal liver function. She's now s/p colonoscopy with GI  Assessment & Plan:   Principal Problem:   ARF (acute renal failure) (HCC) Active Problems:   Rheumatoid arthritis involving multiple sites with positive rheumatoid factor (HCC)   History of hypertension   History of hypothyroidism   Abnormal liver function   Diarrhea   Pulmonary nodule   Metabolic acidosis   Severe protein-calorie malnutrition (HCC)   Rectal bleeding   LFTs abnormal   Hepatic cirrhosis (HCC)   Pancreatic insufficiency   Ascites  Blood in stool, CMV colitis - GI was consulted, appreciate recommendations - Colonoscopy was performed with findings of localized inflammation at ileocecal valve, external and internal hemorrhoids - see report -CMV found to be negative - No report of further bleeding noted currently  Abnormal liver function with hepatorenal syndrome -GI following - Earlier MRCP demonstrated severe diffuse hepatic steatosis (can't exclude cirrhosis) - solitary subcentimeter R liver lesion, likely benign cyst  -Unclear etiology, although had been on methotrexate - Large ascites and worsening renal function concerning for hepatorenal syndrome, see below -Pt is s/p large volume paracentesis yielding 4.5L on 7/22 -GI plans for repeat liver US tomorrow.  Chronic diarrhea - gliadin antibodies - wnl, TTG - normal, -  Cholestyramine prn - Initially resolved with colestid BID, imodium, and creon - Colestid stopped 7/31 as it may contribute to nausea, also to get a better assessment of diarrhea -seems stable at this time  Community Acquired Pneumonia - Bld Cx Neg - CXR with patchy airspace dz in L lung base (early pneumonia vs sequelae of aspiration) - Repeat CXR with small bilateral effusions and associated atelectasis vs consolidation - Completed course of Ceftriaxone/azithro x 5 days - Currently afebrile  AKI on CKD III -Concern for hepatorenal syndrome -Baseline Cr around 1.5 -Diuretics on stopped. Mucus membranes appear dry.  -Cr currently 2.7 -Appreciate assistance by Nephrology. Plan for IV albumin with lasix  Hypotension/Hx of Hypertension - Continue holding antihypertensives for now - BP currently stable  Non- AG acidosis -resolved  N/V - Zofran 4mg  iv q6h prn -Colestid recently stopped by GI as it may contribute to nausea - Currently stable at this time  Hypothyroidism - Cont Levothyroxine 88 micrograms po qday as tolerated  Anxiety - Cont Lexapro 10mg  po qday as pt tolerates  -Presently stable  Normocytic Anemia - stable, no frank bleed noted - Labs reviewed. Hgb stable   RA - Currently off Humira, follows with [facility] - Will continue with Voltaren gel as needed  Pulmonary Nodule - Recommended outpatient follow up  Hypoglycemia -resolved  Hypomagnesemia/Hypophospatemia -replaced  Debility - PT recs SNF; Plan SNF when medically cleared   Toxic metabolic encephalopathy -resolved  Anasarca - BLE edema and signs of volume overload -2d echo reviewed, normal LVEF -Given trial of lasix with some improvement. Lasix stopped given concerns of intravascular depletion and dehydration -Suspect anasarca related to marked malnutrition per above -GI recommendation for Cortrak, orders placed, tube feed per nutrition -Lasix with albumin per  Nephrology   Severe protein calorie malnutrition -anorexia for several months noted -Likely contributing to anasarca above -Cont on tube feeding per Nutrition  DVT prophylaxis: SCD's Code Status: Full Family Communication: Pt in room, family not at bedside Disposition Plan: SNF when medically stable  Consultants:   GI  IR  Procedures:   Colonoscopy 7/19  EGD 7/31  Antimicrobials: Anti-infectives (From admission, onward)   Start     Dose/Rate Route Frequency Ordered Stop   01/07/19 1700  cefTRIAXone (ROCEPHIN) 2 g in sodium chloride 0.9 % 100 mL IVPB     2 g 200 mL/hr over 30 Minutes Intravenous Every 24 hours 01/07/19 1557 01/11/19 1908   01/07/19 1700  azithromycin (ZITHROMAX) 500 mg in sodium chloride 0.9 % 250 mL IVPB     500 mg 250 mL/hr over 60 Minutes Intravenous Every 24 hours 01/07/19 1557 01/11/19 2014      Subjective: Reports two loose stools in past 24hrs  Objective: Vitals:   01/23/19 1700 01/23/19 2208 01/24/19 0500 01/24/19 0513  BP: 117/67 117/65  120/82  Pulse: 92 85  85  Resp: 16 16  14   Temp: 98.2 F (36.8 C) 98.2 F (36.8 C)  97.8 F (36.6 C)  TempSrc: Oral Oral  Oral  SpO2: 91% 95%  96%  Weight:   76.4 kg   Height:        Intake/Output Summary (Last 24 hours) at 01/24/2019 1717 Last data filed at 01/24/2019 1400 Gross per 24 hour  Intake 2660.34 ml  Output 150 ml  Net 2510.34 ml   Filed Weights   01/22/19 0501 01/23/19 0604 01/24/19 0500  Weight: 73 kg 73.8 kg 76.4 kg    Examination: General exam: Awake, laying in bed, in nad Respiratory system: Normal respiratory effort, no wheezing Cardiovascular system: regular rate, s1, s2 Gastrointestinal system: Soft, ascitic with pos fluid wave, obese Central nervous system: CN2-12 grossly intact, strength intact Extremities: Perfused, no clubbing, edematous Skin: Normal skin turgor, no notable skin lesions seen Psychiatry: Mood normal // no visual hallucinations   Data Reviewed: I have personally  reviewed following labs and imaging studies  CBC: Recent Labs  Lab 01/22/19 0348 01/24/19 0350  WBC 14.2* 8.1  HGB 8.8* 8.7*  HCT 28.0* 28.2*  MCV 100.0 100.0  PLT 159 829   Basic Metabolic Panel: Recent Labs  Lab 01/20/19 0533 01/21/19 0517 01/22/19 0348 01/23/19 0455 01/24/19 0350 01/24/19 1551  NA 140 138 139 139 141  --   K 4.4 4.5 4.5 4.2 4.1  --   CL 107 106 107 108 110  --   CO2 23 25 26 22 24   --   GLUCOSE 86 84 119* 98 95  --   BUN 40* 46* 55* 56* 59*  --   CREATININE 2.63* 2.73* 2.83* 2.73* 2.78*  --   CALCIUM 8.2* 8.1* 7.9* 7.7* 7.8*  --   MG  --   --   --   --  2.0  --   PHOS  --   --   --   --   --  3.2   GFR: Estimated Creatinine Clearance: 18.4 mL/min (A) (by C-G formula based on SCr of 2.78 mg/dL (H)). Liver Function Tests: Recent Labs  Lab 01/20/19 0533 01/21/19 0517 01/22/19 0348 01/23/19 0455 01/24/19 0350  AST 62* 57* 57* 59* 65*  ALT 18 13 15 15 17   ALKPHOS 231* 191* 177* 187* 205*  BILITOT 3.2* 3.2* 2.7* 2.0* 1.9*  PROT 5.3* 4.8*  4.8* 4.6* 4.6*  ALBUMIN 2.0* 1.7* 1.7* 1.7* 1.6*   No results for input(s): LIPASE, AMYLASE in the last 168 hours. No results for input(s): AMMONIA in the last 168 hours. Coagulation Profile: Recent Labs  Lab 01/21/19 0517  INR 1.2   Cardiac Enzymes: No results for input(s): CKTOTAL, CKMB, CKMBINDEX, TROPONINI in the last 168 hours. BNP (last 3 results) No results for input(s): PROBNP in the last 8760 hours. HbA1C: No results for input(s): HGBA1C in the last 72 hours. CBG: Recent Labs  Lab 01/23/19 1717 01/23/19 2209 01/24/19 0818 01/24/19 1159 01/24/19 1642  GLUCAP 99 85 82 108* 100*   Lipid Profile: No results for input(s): CHOL, HDL, LDLCALC, TRIG, CHOLHDL, LDLDIRECT in the last 72 hours. Thyroid Function Tests: No results for input(s): TSH, T4TOTAL, FREET4, T3FREE, THYROIDAB in the last 72 hours. Anemia Panel: No results for input(s): VITAMINB12, FOLATE, FERRITIN, TIBC, IRON, RETICCTPCT  in the last 72 hours. Sepsis Labs: No results for input(s): PROCALCITON, LATICACIDVEN in the last 168 hours.  Recent Results (from the past 240 hour(s))  Novel Coronavirus, NAA (hospital order; send-out to ref lab)     Status: None   Collection Time: 01/17/19  7:37 PM   Specimen: Nasopharyngeal Swab; Respiratory  Result Value Ref Range Status   SARS-CoV-2, NAA NOT DETECTED NOT DETECTED Final    Comment: (NOTE) This test was developed and its performance characteristics determined by [employer]. This test has not been FDA cleared or approved. This test has been authorized by FDA under an Emergency Use Authorization (EUA). This test is only authorized for the duration of time the declaration that circumstances exist justifying the authorization of the emergency use of in vitro diagnostic tests for detection of SARS-CoV-2 virus and/or diagnosis of COVID-19 infection under section 564(b)(1) of the Act, 21 U.S.C. 962XBM-8(U)(1), unless the authorization is terminated or revoked sooner. When diagnostic testing is negative, the possibility of a false negative result should be considered in the context of a patient's recent exposures and the presence of clinical signs and symptoms consistent with COVID-19. An individual without symptoms of COVID-19 and who is not shedding SARS-CoV-2 virus would expect to have a negative (not detected) result in this assay. Performed  At: [facility] [place], [place] [id number] Rush Farmer MD OZ:[id number]    [place]  Final    Comment: Performed at [facility] [address], [place], [place] [address]     Radiology Studies: Dg Abd 1 View  Result Date: 01/23/2019 CLINICAL DATA:  NG tube placement EXAM: ABDOMEN - 1 VIEW COMPARISON:  None. FINDINGS: Feeding tube with weighted tip in the gastric body. Stylet remains within the tube. IMPRESSION: Feeding tube with tip in the  stomach. Electronically Signed   By: Suzy Bouchard M.D.   On: 01/23/2019 16:26    Scheduled Meds: . budesonide  9 mg Oral Daily  . calcium carbonate  500 mg of elemental calcium Oral BID   And  . cholecalciferol  200 Units Oral BID  . escitalopram  10 mg Oral Daily  . feeding supplement (PRO-STAT SUGAR FREE 64)  30 mL Per Tube BID  . furosemide  40 mg Intravenous Q8H  . levothyroxine  88 mcg Oral Q0600  . magnesium oxide  400 mg Oral BID  . multivitamin with minerals  1 tablet Oral Daily  . ondansetron  4 mg Intravenous Q8H  . pantoprazole  40 mg Oral BID  . sodium bicarbonate  650 mg Oral BID   Continuous Infusions: . albumin human    . dextrose 5 % and 0.9% NaCl 50 mL/hr at 01/24/19 1400  . feeding supplement (JEVITY 1.5 CAL/FIBER)       LOS: 17 days   Marylu Lund, MD [facility] Pager On Amion  If 7PM-7AM, please contact night-coverage 01/24/2019, 5:17 PM

## 2019-01-24 NOTE — Progress Notes (Signed)
Physical Therapy Treatment Patient Details Name: Kaylee Rasmussen MRN: [id number] DOB: 26-Sep-1950 Today's Date: 01/24/2019    History of Present Illness 67 yo female admitted to ED 7/16 with rectal bleeding, CT abdomen pelvis revealing ? liver cirrhosis, ascites, small pleural effusions. Colonoscopy 7/19 reveals internal and external hemorrhoids, diverticulosis. 7/22 paracentesis yielding 4.5L clear yellow fluid. PMH includes HTN, HLD, asthma, RA, necrotizing glomerulonephritis/vasculitis, pancytopenia, chronic diarrhea with N/V.    PT Comments    Patient was agreeable to participate in therapy with encouragement from her husband. She continues to require min to mod assist for bed mobility with cues for hand placement to support herself while sitting EOB. Patient ambulated 2x ~15 feet this date with 1 seated rest break between bouts. She required min assist and cues for safe proximity to RW. Patient limited with gait by fatigue and EOS assisted her back to supine in bed. She will continue to benefit from skilled PT interventions at below venue to progress independence. Acute PT will continue to progress mobility as able.    Follow Up Recommendations  SNF     Equipment Recommendations  None recommended by PT    Recommendations for Other Services       Precautions / Restrictions Precautions Precautions: Fall Restrictions Weight Bearing Restrictions: No    Mobility  Bed Mobility Overal bed mobility: Needs Assistance Bed Mobility: Supine to Sit;Sit to Supine     Supine to sit: Min assist Sit to supine: Mod assist   General bed mobility comments: pt requires assist to sequence roll to sidelying to press up and raise trunk to sit EOB, mod assist required for supine to sit for LE management and to lower trunk to sidelying  Transfers Overall transfer level: Needs assistance Equipment used: Rolling walker (2 wheeled) Transfers: Sit to/from Stand Sit to Stand: Min assist          General transfer comment: assist required to initiate rise and cues for hand placement on RW, 2x sit to stand (EOB and bedside recliner)  Ambulation/Gait Ambulation/Gait assistance: Min assist Gait Distance (Feet): 30 Feet(2 boutes of ~ 15 feet with seated rest break between) Assistive device: Rolling walker (2 wheeled) Gait Pattern/deviations: Step-through pattern;Decreased stride length Gait velocity: decreased   General Gait Details: pt ambulated in hall with min assist and her husband assisting with IV Pole, chair follow for safety       Balance Overall balance assessment: Needs assistance Sitting-balance support: No upper extremity supported;Feet supported Sitting balance-Leahy Scale: Fair     Standing balance support: Bilateral upper extremity supported;During functional activity Standing balance-Leahy Scale: Poor Standing balance comment: pt relies on UE support               Cognition Arousal/Alertness: Awake/alert Behavior During Therapy: WFL for tasks assessed/performed Overall Cognitive Status: Within Functional Limits for tasks assessed                          Pertinent Vitals/Pain Pain Assessment: Faces Faces Pain Scale: Hurts a little bit Pain Location: abdomen Pain Descriptors / Indicators: Aching Pain Intervention(s): Limited activity within patient's tolerance;Monitored during session           PT Goals (current goals can now be found in the care plan section) Acute Rehab PT Goals Patient Stated Goal: go home, get better PT Goal Formulation: With patient Time For Goal Achievement: 01/27/19 Potential to Achieve Goals: Good Progress towards PT goals: Progressing toward goals  Frequency    Min 2X/week      PT Plan Current plan remains appropriate       AM-PAC PT "6 Clicks" Mobility   Outcome Measure  Help needed turning from your back to your side while in a flat bed without using bedrails?: A Little Help needed moving  from lying on your back to sitting on the side of a flat bed without using bedrails?: A Little Help needed moving to and from a bed to a chair (including a wheelchair)?: A Little Help needed standing up from a chair using your arms (e.g., wheelchair or bedside chair)?: A Little Help needed to walk in hospital room?: A Little Help needed climbing 3-5 steps with a railing? : A Lot 6 Click Score: 17    End of Session Equipment Utilized During Treatment: Gait belt Activity Tolerance: Patient limited by fatigue Patient left: in bed;with call bell/phone within reach;with bed alarm set;with family/visitor present Nurse Communication: Mobility status PT Visit Diagnosis: Other abnormalities of gait and mobility (R26.89);Muscle weakness (generalized) (M62.81);History of falling (Z91.81)     Time: 3568-6168 PT Time Calculation (min) (ACUTE ONLY): 20 min  Charges:  $Gait Training: 8-22 mins                     Kipp Brood, PT, DPT, [facility] Physical Therapist with [facility]  01/24/2019 6:04 PM

## 2019-01-24 NOTE — Progress Notes (Signed)
Patient ID: Kaylee Rasmussen, female   DOB: [birth year], 68 y.o.   MRN: [id number]    Progress Note   Subjective  Day # 18  Up in chair - affect flat , full plate in front of her -still some nausea but improved since stopped Colestid etc Diarrhea improved - 2 BM's /day still watery  No appetite Cor track  Feeding tube placed yesterday - feedings to start today   EGD 01/21/2019-grade B distal esophagitis, mild portal hypertensive gastropathy in most of the stomach, 1 nonbleeding cratered duodenal ulcer with clean base, 7 mm -multiple biopsies pending   Creatinine 2.78-stable, albumin 1.6  Urine sodium less than 10 yesterday     Objective   Vital signs in last 24 hours: Temp:  [97.8 F (36.6 C)-98.2 F (36.8 C)] 97.8 F (36.6 C) (08/03 0513) Pulse Rate:  [85-92] 85 (08/03 0513) Resp:  [14-16] 14 (08/03 0513) BP: (117-120)/(65-82) 120/82 (08/03 0513) SpO2:  [91 %-96 %] 96 % (08/03 0513) Weight:  [76.4 kg] 76.4 kg (08/03 0500) Last BM Date: 01/23/19 General: Older white female in NAD chronically ill-appearing and depressed appearing Heart:  Regular rate and rhythm; no murmurs Lungs: Respirations even and unlabored, lungs CTA bilaterally Abdomen:  Soft, large amount of ascites, non-tense, nontender, bowel sounds are present, pitting edema and knee lower abdominal tissues and into the flanks Extremities: 1+ edema bilateral lower extremities Neurologic:  Alert and oriented,  grossly normal neurologically. Psych:  Cooperative. Normal mood and flat affect.  Intake/Output from previous day: 08/02 0701 - 08/03 0700 In: 1824.5 [P.O.:120; I.V.:1704.5] Out: 200 [Urine:200] Intake/Output this shift: Total I/O In: -  Out: 150 [Urine:150]  Lab Results: Recent Labs    01/22/19 0348 01/24/19 0350  WBC 14.2* 8.1  HGB 8.8* 8.7*  HCT 28.0* 28.2*  PLT 159 155   BMET Recent Labs    01/22/19 0348 01/23/19 0455 01/24/19 0350  NA 139 139 141  K 4.5 4.2 4.1  CL 107 108 110  CO2  26 22 24   GLUCOSE 119* 98 95  BUN 55* 56* 59*  CREATININE 2.83* 2.73* 2.78*  CALCIUM 7.9* 7.7* 7.8*   LFT Recent Labs    01/24/19 0350  PROT 4.6*  ALBUMIN 1.6*  AST 65*  ALT 17  ALKPHOS 205*  BILITOT 1.9*   PT/INR No results for input(s): LABPROT, INR in the last 72 hours.  Studies/Results: Dg Abd 1 View  Result Date: 01/23/2019 CLINICAL DATA:  NG tube placement EXAM: ABDOMEN - 1 VIEW COMPARISON:  None. FINDINGS: Feeding tube with weighted tip in the gastric body. Stylet remains within the tube. IMPRESSION: Feeding tube with tip in the stomach. Electronically Signed   By: Genevive Bi M.D.   On: 01/23/2019 16:26       Assessment / Plan:    #4 68 year old white female with complicated history, and multiple comorbidities including rheumatoid arthritis, chronic kidney disease, prior history of necrotizing glomerulonephritis, hypertension, COPD, who was admitted over 2 weeks ago with persistent diarrhea, weight loss and abdominal discomfort. She had been diagnosed with CMV colitis and HSV esophagitis February 2020 while hospitalized in [place].  Repeat colonoscopy this admission showed localized inflammation at the ileocecal valve, no CMV but biopsies did show mildly active nonspecific colitis cannot rule out IBD She has been started on budesonide 9 mg p.o. daily, still requiring as needed antidiarrheals-diarrhea has improved  #2 persistent failure to thrive with severe protein calorie malnutrition Patient has been unable to eat or drink much  of anything. Feeding tube placed , and hope to start enteral feedings today  #3 persistent nausea and intermittent vomiting-EGD did show distal esophagitis and portal hypertensive gastropathy as well as 1 clean-based duodenal ulcer.  Improvement with discontinuation of Colestid and Creon  Continue  daily PPI IV Around-the-clock antiemetics Await biopsies  #4 anasarca-This may be multi-factorial with severe  malnutrition/hypoalbuminemia and new underlying diagnosis of cirrhosis  #5 acute kidney injury, superimposed on chronic kidney disease-she has not had any significant improvement, and urine sodium less than 10 yesterday concerning for hepatorenal syndrome  Need to have nephrology involved today  #6 cirrhosis-unclear etiology, all markers negative.  She had been on methotrexate long-term in the past.  May eventually need liver biopsy, will plan to repeat Doppler ultrasound of the liver tomorrow, I also want to do diagnostic tap at that time to calculate SAAG-though at this point appears clear that she does have decompensated liver disease  #7 chronic anemia #8 rheumatoid arthritis    Principal Problem:   ARF (acute renal failure) (HCC) Active Problems:   Rheumatoid arthritis involving multiple sites with positive rheumatoid factor (HCC)   History of hypertension   History of hypothyroidism   Abnormal liver function   Diarrhea   Pulmonary nodule   Metabolic acidosis   Severe protein-calorie malnutrition (HCC)   Rectal bleeding   LFTs abnormal   Hepatic cirrhosis (HCC)   Pancreatic insufficiency   Ascites     LOS: 17 days   Asani Deniston EsterwoodPA-C  01/24/2019, 10:25 AM

## 2019-01-25 ENCOUNTER — Inpatient Hospital Stay (HOSPITAL_COMMUNITY): Payer: PPO

## 2019-01-25 DIAGNOSIS — M0579 Rheumatoid arthritis with rheumatoid factor of multiple sites without organ or systems involvement: Secondary | ICD-10-CM

## 2019-01-25 LAB — BODY FLUID CELL COUNT WITH DIFFERENTIAL
Lymphs, Fluid: 55 %
Monocyte-Macrophage-Serous Fluid: 44 % — ABNORMAL LOW (ref 50–90)
Neutrophil Count, Fluid: 1 % (ref 0–25)
Total Nucleated Cell Count, Fluid: 48 cu mm (ref 0–1000)

## 2019-01-25 LAB — COMPREHENSIVE METABOLIC PANEL
ALT: 15 U/L (ref 0–44)
AST: 53 U/L — ABNORMAL HIGH (ref 15–41)
Albumin: 3 g/dL — ABNORMAL LOW (ref 3.5–5.0)
Alkaline Phosphatase: 169 U/L — ABNORMAL HIGH (ref 38–126)
Anion gap: 9 (ref 5–15)
BUN: 58 mg/dL — ABNORMAL HIGH (ref 8–23)
CO2: 24 mmol/L (ref 22–32)
Calcium: 8.5 mg/dL — ABNORMAL LOW (ref 8.9–10.3)
Chloride: 111 mmol/L (ref 98–111)
Creatinine, Ser: 2.77 mg/dL — ABNORMAL HIGH (ref 0.44–1.00)
GFR calc Af Amer: 20 mL/min — ABNORMAL LOW (ref >60)
GFR calc non Af Amer: 17 mL/min — ABNORMAL LOW (ref >60)
Glucose, Bld: 104 mg/dL — ABNORMAL HIGH (ref 70–99)
Potassium: 3.5 mmol/L (ref 3.5–5.1)
Sodium: 144 mmol/L (ref 135–145)
Total Bilirubin: 2.4 mg/dL — ABNORMAL HIGH (ref 0.3–1.2)
Total Protein: 5.3 g/dL — ABNORMAL LOW (ref 6.5–8.1)

## 2019-01-25 LAB — GLUCOSE, CAPILLARY
Glucose-Capillary: 102 mg/dL — ABNORMAL HIGH (ref 70–99)
Glucose-Capillary: 106 mg/dL — ABNORMAL HIGH (ref 70–99)
Glucose-Capillary: 110 mg/dL — ABNORMAL HIGH (ref 70–99)
Glucose-Capillary: 77 mg/dL (ref 70–99)
Glucose-Capillary: 83 mg/dL (ref 70–99)
Glucose-Capillary: 93 mg/dL (ref 70–99)

## 2019-01-25 LAB — ALBUMIN, PLEURAL OR PERITONEAL FLUID: Albumin, Fluid: <1 g/dL

## 2019-01-25 LAB — CBC
HCT: 24.7 % — ABNORMAL LOW (ref 36.0–46.0)
Hemoglobin: 7.7 g/dL — ABNORMAL LOW (ref 12.0–15.0)
MCH: 31.2 pg (ref 26.0–34.0)
MCHC: 31.2 g/dL (ref 30.0–36.0)
MCV: 100 fL (ref 80.0–100.0)
Platelets: 151 10*3/uL (ref 150–400)
RBC: 2.47 MIL/uL — ABNORMAL LOW (ref 3.87–5.11)
RDW: 21.1 % — ABNORMAL HIGH (ref 11.5–15.5)
WBC: 7.7 10*3/uL (ref 4.0–10.5)
nRBC: 0 % (ref 0.0–0.2)

## 2019-01-25 LAB — LACTATE DEHYDROGENASE, PLEURAL OR PERITONEAL FLUID: LD, Fluid: 17 U/L (ref 3–23)

## 2019-01-25 LAB — PROTEIN, PLEURAL OR PERITONEAL FLUID: Total protein, fluid: <3 g/dL

## 2019-01-25 LAB — PHOSPHORUS
Phosphorus: 3.4 mg/dL (ref 2.5–4.6)
Phosphorus: 3.5 mg/dL (ref 2.5–4.6)

## 2019-01-25 LAB — MAGNESIUM: Magnesium: 2 mg/dL (ref 1.7–2.4)

## 2019-01-25 MED ORDER — LIDOCAINE HCL 1 % IJ SOLN
INTRAMUSCULAR | Status: AC
  Filled 2019-01-25: qty 10

## 2019-01-25 MED ORDER — FUROSEMIDE 10 MG/ML IJ SOLN
60.0000 mg | Freq: Three times a day (TID) | INTRAMUSCULAR | Status: DC
  Administered 2019-01-25 – 2019-01-28 (×9): 60 mg via INTRAVENOUS
  Filled 2019-01-25 (×9): qty 6

## 2019-01-25 NOTE — Progress Notes (Signed)
Patient ID: Kaylee Rasmussen, female   DOB: 1951-03-14, 68 y.o.   MRN: [id number]    Progress Note   Subjective   Day #19 No new complaints, smiling this  am - please that her husband can come visit today - less nauseated  But still not much appetite, urinating a lot, no diarrhea today   Feeding tube out , and pt says decision made not to replace it   Started lasix and albumin yesterday  Renal US -p   Objective   Vital signs in last 24 hours: Temp:  [98.2 F (36.8 C)-98.6 F (37 C)] 98.2 F (36.8 C) (08/04 0441) Pulse Rate:  [80-88] 80 (08/04 0441) Resp:  [14-16] 16 (08/04 0441) BP: (114-143)/(65-73) 143/68 (08/04 0441) SpO2:  [90 %-94 %] 90 % (08/04 0441) Weight:  [74.6 kg] 74.6 kg (08/04 0500) Last BM Date: 01/23/19 General:   older white female in NAD, smiling Heart:  Regular rate and rhythm; no murmurs Lungs: Respirations even and unlabored, lungs CTA bilaterally Abdomen:  Soft, nontender nontense ascites, BS+soft tissue edema Extremities: 1+ edema Neurologic:  Alert and oriented,  grossly normal neurologically. Psych:  Cooperative. Normal mood and affect.  Intake/Output from previous day: 08/03 0701 - 08/04 0700 In: 2878 [P.O.:410; I.V.:948.3; IV Piggyback:248.8] Out: 950 [Urine:950] Intake/Output this shift: Total I/O In: -  Out: 300 [Urine:300]  Lab Results: Recent Labs    01/24/19 0350 01/25/19 0507  WBC 8.1 7.7  HGB 8.7* 7.7*  HCT 28.2* 24.7*  PLT 155 151   BMET Recent Labs    01/23/19 0455 01/24/19 0350 01/25/19 0507  NA 139 141 144  K 4.2 4.1 3.5  CL 108 110 111  CO2 22 24 24   GLUCOSE 98 95 104*  BUN 56* 59* 58*  CREATININE 2.73* 2.78* 2.77*  CALCIUM 7.7* 7.8* 8.5*   LFT Recent Labs    01/25/19 0507  PROT 5.3*  ALBUMIN 3.0*  AST 53*  ALT 15  ALKPHOS 169*  BILITOT 2.4*   PT/INR No results for input(s): LABPROT, INR in the last 72 hours.  Studies/Results: Dg Abd 1 View  Result Date: 01/23/2019 CLINICAL DATA:  NG tube  placement EXAM: ABDOMEN - 1 VIEW COMPARISON:  None. FINDINGS: Feeding tube with weighted tip in the gastric body. Stylet remains within the tube. IMPRESSION: Feeding tube with tip in the stomach. Electronically Signed   By: Suzy Bouchard M.D.   On: 01/23/2019 16:26       Assessment / Plan:    #1  Complicated 68 year old white female with history of rheumatoid arthritis, chronic kidney disease and prior history of necrotizing glomerulonephritis, hypertension, and COPD who was admitted 19 days ago with persistent diarrhea, weight loss, abdominal discomfort lab. History of CMV colitis and HSV esophagitis February 2025 hospitalized in [place], treated for both  Colonoscopy this admission - mild nonspecific active colitis on biopsies, cannot rule out IBD She was started on budesonide 9 mg daily, last week and diarrhea has definitely improved  #2 persistent failure to thrive with severe protein calorie malnutrition-feeding tube was placed yesterday, came out last night a few hours after feedings were started and has not been replaced. Patient states decision was made to leave feeding tube out, and she is happy about that.  Not sure how that decision was made, but okay leaving out today and allow her to continue efforts at oral feedings  #3 nausea, intermittent vomiting, and anorexia-improved.  She still not eating much but nausea is improving Distal  esophagitis and one clean-based duodenal ulcer at EGD as well as mild portal hypertensive gastropathy Continue IV PPI, continue around-the-clock Zofran  #4 acute kidney injury superimposed on chronic kidney disease-urine sodium less than 10, and felt by nephrology probably to have hepatorenal syndrome. Continue 3 times daily Lasix and IV albumin  #5 volume overload-as above, component of third spacing, avoiding IV fluids #6 Cirrhosis-unclear etiology, decompensated with ascites- Many year history of methotrexate use Patient is for hepatic  ultrasound Doppler today Diagnostic paracentesis today for cell counts, and calculate SAAG         Principal Problem:   ARF (acute renal failure) (HCC) Active Problems:   Rheumatoid arthritis involving multiple sites with positive rheumatoid factor (HCC)   History of hypertension   History of hypothyroidism   Abnormal liver function   Diarrhea   Pulmonary nodule   Metabolic acidosis   Severe protein-calorie malnutrition (HCC)   Rectal bleeding   LFTs abnormal   Hepatic cirrhosis (HCC)   Pancreatic insufficiency   Ascites   AKI (acute kidney injury) ([place])     LOS: 18 days   Amy Esterwood  01/25/2019, 8:55 AM

## 2019-01-25 NOTE — Progress Notes (Signed)
[facility] Progress Note  Subjective: no new c/o's.  Good UOP on IV lasix,k creat stable  Vitals:   01/24/19 2028 01/24/19 2212 01/25/19 0441 01/25/19 0500  BP: 128/73 114/65 (!) 143/68   Pulse: 87 85 80   Resp: 16 14 16    Temp: 98.6 F (37 C) 98.5 F (36.9 C) 98.2 F (36.8 C)   TempSrc: Oral  Oral   SpO2: 91% 91% 90%   Weight:    74.6 kg  Height:        Inpatient medications: . budesonide  9 mg Oral Daily  . calcium carbonate  500 mg of elemental calcium Oral BID   And  . cholecalciferol  200 Units Oral BID  . escitalopram  10 mg Oral Daily  . feeding supplement (PRO-STAT SUGAR FREE 64)  30 mL Per Tube BID  . furosemide  40 mg Intravenous Q8H  . insulin aspart  0-9 Units Subcutaneous Q4H  . levothyroxine  88 mcg Oral Q0600  . LORazepam  0.5 mg Intravenous Once  . magnesium oxide  400 mg Oral BID  . multivitamin with minerals  1 tablet Oral Daily  . ondansetron  4 mg Intravenous Q8H  . pantoprazole  40 mg Oral BID  . sodium bicarbonate  650 mg Oral BID   . albumin human Stopped (01/25/19 0143)  . feeding supplement (JEVITY 1.5 CAL/FIBER) Stopped (01/24/19 1939)   acetaminophen, albuterol, diclofenac sodium, loperamide, morphine injection, ondansetron, oxyCODONE, sodium chloride flush, sodium chloride flush    Exam: Gen alert, chron ill appearing No rash, cyanosis or gangrene Sclera anicteric, throat clear  No jvd or bruits Chest clear bilat except some dec'd BS L base RRR no MRG Abd soft ntnd 2-3+ ascites diffusely GU defer MS no joint effusions or deformity Ext 2-3+ diffuse ext edema, no wounds or ulcer Neuro is alert, Ox 3 , nf , no asterixis    Home meds:  - amlodipine 2.5/ metoprolol xl 100 qd  - escitalopram 10 qd  - sod bicarb bid/ Kdur 10 qd/ proair hfa prn/ levothyroxine 88 qd  - prn's/ vitamins/ supplements   CT abd /pelvis 01/06/19 > 1. Abnormal appearance of the liver with heterogeneous hepatic steatosis and heterogeneous  liver enhancement. Questionable fine liver surface irregularity. Cannot exclude hepatic cirrhosis. Nodiscrete liver masses. Recommend correlation with liver enzymes. 2. Third-spacing of fluid with moderate ascites, small dependent bilateral pleural effusions, right greater than left, and mild anasarca. 3. No evidence of bowel obstruction or acute bowel inflammation. Mild colonic diverticulosis, without convincing findings of acute diverticulitis. 4. Patchy bandlike consolidation in the left lung base, cannot exclude pneumonia or aspiration.    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  BP's are not low which is unusual for HRS however.  She is vol overloaded w/ diffuse edema. Recommended trial of diuresis w/ IV lasix and IV albumin. Tolerating well so far at 40 mg tid lasix, will increase slightly to 60 tid. Creat stable. Will follow.   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 Get renal US, kidneys look small on CT.   Ascites/ fatty liver - possible cirrhosis picture, GI is following  Vol overload - as above, very low  alb w/ 3rd spacing. Avoid any IVF's except D5W or D10 if needed for low BS's.      [facility] 01/25/2019, 8:49 AM  Iron/TIBC/Ferritin/ %Sat    Component Value Date/Time   IRON 81 01/07/2019 0928   TIBC NOT CALCULATED 01/07/2019 0928   FERRITIN 498 (H) 01/07/2019 0928   IRONPCTSAT NOT CALCULATED 01/07/2019 0928   Recent Labs  Lab 01/21/19 0517  01/25/19 0507  NA 138   < > 144  K 4.5   < > 3.5  CL 106   < > 111  CO2 25   < > 24  GLUCOSE 84   < > 104*  BUN 46*   < > 58*  CREATININE 2.73*   < > 2.77*  CALCIUM 8.1*   < > 8.5*  PHOS  --    < > 3.4  ALBUMIN 1.7*   < > 3.0*  INR 1.2  --   --    < > = values in this interval not  displayed.   Recent Labs  Lab 01/25/19 0507  AST 53*  ALT 15  ALKPHOS 169*  BILITOT 2.4*  PROT 5.3*   Recent Labs  Lab 01/25/19 0507  WBC 7.7  HGB 7.7*  HCT 24.7*  PLT 151

## 2019-01-25 NOTE — Procedures (Signed)
Ultrasound-guided diagnostic paracentesis performed yielding 250 cc (maximum ordered) of clear, light yellow fluid. No immediate complications.  The fluid was sent to the lab for preordered studies. EBL none.

## 2019-01-25 NOTE — Progress Notes (Signed)
Per Thornell Mule, ok to hold off on replacing feeding tube until patient tries eating this afternoon after liver doppler test. Verbal order received for body fluid culture from paracentesis, lab informed.

## 2019-01-25 NOTE — Progress Notes (Signed)
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  She was admitted for blood in stool and chronic diarrhea and abnormal liver function. She's now s/p colonoscopy with GI  Assessment & Plan:   Principal Problem:   ARF (acute renal failure) (HCC) Active Problems:   Rheumatoid arthritis involving multiple sites with positive rheumatoid factor (HCC)   History of hypertension   History of hypothyroidism   Abnormal liver function   Diarrhea   Pulmonary nodule   Metabolic acidosis   Severe protein-calorie malnutrition (HCC)   Rectal bleeding   LFTs abnormal   Hepatic cirrhosis (HCC)   Pancreatic insufficiency   Ascites   AKI (acute kidney injury) ([place])  Blood in stool, CMV colitis - GI was consulted, appreciate recommendations - Colonoscopy was performed with findings of localized inflammation at ileocecal valve, external and internal hemorrhoids - see report -CMV found to be negative - No report of further bleeding noted currently  Abnormal liver function with hepatorenal syndrome -GI following - Earlier MRCP demonstrated severe diffuse hepatic steatosis (can't exclude cirrhosis) - solitary subcentimeter R liver lesion, likely benign cyst  -Unclear etiology, although had been on methotrexate - Large ascites and worsening renal function concerning for hepatorenal syndrome, see below -Pt is s/p large volume paracentesis yielding 4.5L on 7/22 -Liver US performed today, pending results -Pt for diagnostic paracentesis this  afternoon  Chronic diarrhea - gliadin antibodies - wnl, TTG - normal, - Cholestyramine prn - Initially resolved with colestid BID, imodium, and creon - Colestid stopped 7/31 as it may contribute to nausea, also to get a better assessment of diarrhea -Entocort started 7/31 -seems stable at this time  Community Acquired Pneumonia - Bld Cx Neg - CXR with patchy airspace dz in L lung base (early pneumonia vs sequelae of aspiration) - Repeat CXR with small bilateral effusions and associated atelectasis vs consolidation - Completed course of Ceftriaxone/azithro x 5 days - Remains afebrile  AKI on CKD III -Concern for hepatorenal syndrome -Baseline Cr around 1.5 -Diuretics on stopped. Mucus membranes appear dry.  -Cr currently 2.77 -Appreciate assistance by Nephrology. Plan for IV albumin with lasix  Hypotension/Hx of Hypertension - Continue holding antihypertensives for now - BP remains stable  Non- AG acidosis -resolved  N/V - Zofran 4mg  iv q6h prn -Colestid recently stopped by GI as it may contribute to nausea - presently stable at this time  Hypothyroidism - Cont Levothyroxine 88 micrograms po qday as tolerated  Anxiety - Cont Lexapro 10mg  po qday as pt tolerates  -Currently stable  Normocytic Anemia - stable, no frank bleed noted - Labs reviewed. Hgb stable   RA - Currently off Humira, follows with Deveshwar - continue with Voltaren gel as needed  Pulmonary Nodule - Recommended outpatient follow up  Hypoglycemia -resolved  Hypomagnesemia/Hypophospatemia -replaced  Debility - PT recs SNF; Plan SNF when medically cleared   Toxic metabolic encephalopathy -resolved  Anasarca - BLE edema and signs of volume overload -2d echo reviewed, normal LVEF -Given trial of lasix with some improvement. Lasix stopped given concerns of intravascular depletion and dehydration -Suspect anasarca related to marked malnutrition per above -  GI  recommendation for Cortrak, however pt pulled tube overnight. OK to leave out for now per GI -Cont lasix with albumin per Nephrology  Severe protein calorie malnutrition -anorexia for several months noted -Likely contributing to anasarca above -Encourage PO intake. NG tube lost overnight - OK to leave out for today per GI  DVT prophylaxis: SCD's Code Status: Full Family Communication: Pt in room, family not at bedside Disposition Plan: SNF when medically stable  Consultants:   GI  IR  Procedures:   Colonoscopy 7/19  EGD 7/31  Antimicrobials: Anti-infectives (From admission, onward)   Start     Dose/Rate Route Frequency Ordered Stop   01/07/19 1700  cefTRIAXone (ROCEPHIN) 2 g in sodium chloride 0.9 % 100 mL IVPB     2 g 200 mL/hr over 30 Minutes Intravenous Every 24 hours 01/07/19 1557 01/11/19 1908   01/07/19 1700  azithromycin (ZITHROMAX) 500 mg in sodium chloride 0.9 % 250 mL IVPB     500 mg 250 mL/hr over 60 Minutes Intravenous Every 24 hours 01/07/19 1557 01/11/19 2014      Subjective: Lost NG overnight.   Objective: Vitals:   01/25/19 1125 01/25/19 1131 01/25/19 1151 01/25/19 1307  BP: 122/67 128/66 122/66 134/67  Pulse:   82 83  Resp:   16 16  Temp:   98.4 F (36.9 C) 97.8 F (36.6 C)  TempSrc:   Oral Oral  SpO2:   94% 93%  Weight:      Height:        Intake/Output Summary (Last 24 hours) at 01/25/2019 1502 Last data filed at 01/25/2019 1308 Gross per 24 hour  Intake 651.19 ml  Output 1450 ml  Net -798.81 ml   Filed Weights   01/23/19 0604 01/24/19 0500 01/25/19 0500  Weight: 73.8 kg 76.4 kg 74.6 kg    Examination: General exam: Conversant, in no acute distress Respiratory system: normal chest rise, clear, no audible wheezing Cardiovascular system: regular rhythm, s1-s2 Gastrointestinal system: Nondistended, nontender, pos BS Central nervous system: No seizures, no tremors Extremities: No cyanosis, no joint deformities Skin: No rashes, no  pallor Psychiatry: Affect normal // no auditory hallucinations   Data Reviewed: I have personally reviewed following labs and imaging studies  CBC: Recent Labs  Lab 01/22/19 0348 01/24/19 0350 01/25/19 0507  WBC 14.2* 8.1 7.7  HGB 8.8* 8.7* 7.7*  HCT 28.0* 28.2* 24.7*  MCV 100.0 100.0 100.0  PLT 159 155 409   Basic Metabolic Panel: Recent Labs  Lab 01/21/19 0517 01/22/19 0348 01/23/19 0455 01/24/19 0350 01/24/19 1551 01/25/19 0507  NA 138 139 139 141  --  144  K 4.5 4.5 4.2 4.1  --  3.5  CL 106 107 108 110  --  111  CO2 25 26 22 24   --  24  GLUCOSE 84 119* 98 95  --  104*  BUN 46* 55* 56* 59*  --  58*  CREATININE 2.73* 2.83* 2.73* 2.78*  --  2.77*  CALCIUM 8.1* 7.9* 7.7* 7.8*  --  8.5*  MG  --   --   --  2.0  --  2.0  PHOS  --   --   --   --  3.2 3.4   GFR: Estimated Creatinine Clearance: 18.2 mL/min (A) (by C-G formula based on SCr of 2.77 mg/dL (H)). Liver Function Tests: Recent Labs  Lab 01/21/19 0517 01/22/19 0348 01/23/19 0455 01/24/19 0350 01/25/19 0507  AST 57* 57* 59* 65* 53*  ALT 13 15  15 17 15   ALKPHOS 191* 177* 187* 205* 169*  BILITOT 3.2* 2.7* 2.0* 1.9* 2.4*  PROT 4.8* 4.8* 4.6* 4.6* 5.3*  ALBUMIN 1.7* 1.7* 1.7* 1.6* 3.0*   No results for input(s): LIPASE, AMYLASE in the last 168 hours. No results for input(s): AMMONIA in the last 168 hours. Coagulation Profile: Recent Labs  Lab 01/21/19 0517  INR 1.2   Cardiac Enzymes: No results for input(s): CKTOTAL, CKMB, CKMBINDEX, TROPONINI in the last 168 hours. BNP (last 3 results) No results for input(s): PROBNP in the last 8760 hours. HbA1C: Recent Labs    01/24/19 0350  HGBA1C 4.1*   CBG: Recent Labs  Lab 01/24/19 2024 01/25/19 0013 01/25/19 0437 01/25/19 0748 01/25/19 1242  GLUCAP 123* 110* 106* 102* 77   Lipid Profile: No results for input(s): CHOL, HDL, LDLCALC, TRIG, CHOLHDL, LDLDIRECT in the last 72 hours. Thyroid Function Tests: No results for input(s): TSH, T4TOTAL,  FREET4, T3FREE, THYROIDAB in the last 72 hours. Anemia Panel: No results for input(s): VITAMINB12, FOLATE, FERRITIN, TIBC, IRON, RETICCTPCT in the last 72 hours. Sepsis Labs: No results for input(s): PROCALCITON, LATICACIDVEN in the last 168 hours.  Recent Results (from the past 240 hour(s))  Novel Coronavirus, NAA (hospital order; send-out to ref lab)     Status: None   Collection Time: 01/17/19  7:37 PM   Specimen: Nasopharyngeal Swab; Respiratory  Result Value Ref Range Status   SARS-CoV-2, NAA NOT DETECTED NOT DETECTED Final    Comment: (NOTE) This test was developed and its performance characteristics determined by [employer]. This test has not been FDA cleared or approved. This test has been authorized by FDA under an Emergency Use Authorization (EUA). This test is only authorized for the duration of time the declaration that circumstances exist justifying the authorization of the emergency use of in vitro diagnostic tests for detection of SARS-CoV-2 virus and/or diagnosis of COVID-19 infection under section 564(b)(1) of the Act, 21 U.S.C. 175ZWC-5(E)(5), unless the authorization is terminated or revoked sooner. When diagnostic testing is negative, the possibility of a false negative result should be considered in the context of a patient's recent exposures and the presence of clinical signs and symptoms consistent with COVID-19. An individual without symptoms of COVID-19 and who is not shedding SARS-CoV-2 virus would expect to have a negative (not detected) result in this assay. Performed  At: [facility] [place], [place] [id number] Rush Farmer MD TI:[id number]    [place]  Final    Comment: Performed at [facility] [address] [place], [place] [address]  Body fluid culture     Status: None (Preliminary result)   Collection Time: 01/25/19 11:48 AM   Specimen: Peritoneal Washings; Body  Fluid  Result Value Ref Range Status   Specimen Description   Final    PERITONEAL Performed at [facility], [place] [address] [place], [place] [address]    Special Requests   Final    NONE Performed at [facility], [place] [address], [place], [place] [address]    Gram Stain   Final    WBC PRESENT, PREDOMINANTLY MONONUCLEAR NO ORGANISMS SEEN CYTOSPIN SMEAR Performed at [facility], [place] [address], [place], [place] [address]    Culture PENDING  Incomplete   Report Status PENDING  Incomplete     Radiology Studies: Dg Abd 1 View  Result Date: 01/23/2019 CLINICAL DATA:  NG tube placement EXAM: ABDOMEN - 1 VIEW COMPARISON:  None. FINDINGS: Feeding tube with weighted tip in the gastric body. Stylet remains within the tube. IMPRESSION: Feeding tube with tip in the stomach. Electronically Signed   By: Suzy Bouchard M.D.   On: 01/23/2019 16:26   US Renal  Result Date: 01/25/2019 CLINICAL DATA:  Acute kidney injury EXAM: RENAL / URINARY TRACT ULTRASOUND COMPLETE COMPARISON:  CT 01/06/2019 FINDINGS: Right Kidney: Renal measurements: 9.0 x 4.7 x 4.2 cm = volume: 92 mL . Echogenicity within normal limits. No shadowing stone, mass, or hydronephrosis visualized. Left Kidney: Renal measurements: 8.6 x 4.8 x 4.7 cm = volume: 102 mL. Echogenicity within normal limits. No shadowing stone, mass, or hydronephrosis visualized. Bladder: Appears normal for degree of bladder distention. Other: Limited views of the liver demonstrate diffusely echogenic hepatic parenchyma. Moderate volume ascites. IMPRESSION: 1. Unremarkable sonographic appearance of the kidneys without shadowing stone or hydronephrosis. 2. Moderate volume ascites. 3. Hepatic steatosis. Electronically Signed   By: Davina Poke M.D.   On: 01/25/2019 10:34   US Paracentesis  Result Date: 01/25/2019 INDICATION: Patient with history of rheumatoid arthritis, chronic kidney disease, prior CMV colitis, failure to thrive,  cirrhosis by imaging, recurrent ascites. Request made for diagnostic paracentesis. EXAM: ULTRASOUND GUIDED DIAGNOSTIC PARACENTESIS MEDICATIONS: None COMPLICATIONS: None immediate. PROCEDURE: Informed written consent was obtained from the patient after a discussion of the risks, benefits and alternatives to treatment. A timeout was performed prior to the initiation of the procedure. Initial ultrasound scanning demonstrates a small to moderate amount of ascites within the right lower abdominal quadrant. The right lower abdomen was prepped and draped in the usual sterile fashion. 1% lidocaine was used for local anesthesia. Following this, a 19 gauge, 7-cm, Yueh catheter was introduced. An ultrasound image was saved for documentation purposes. The paracentesis was performed. The catheter was removed and a dressing was applied. The patient tolerated the procedure well without immediate post procedural complication. FINDINGS: A total of approximately 250 cc of clear, light yellow fluid was removed. Samples were sent to the laboratory as requested by the clinical team. IMPRESSION: Successful ultrasound-guided diagnostic paracentesis yielding 250 cc of peritoneal fluid. Read by: Rowe Robert, PA-C Electronically Signed   By: Jerilynn Mages.  Shick M.D.   On: 01/25/2019 11:37    Scheduled Meds: . budesonide  9 mg Oral Daily  . calcium carbonate  500 mg of elemental calcium Oral BID   And  . cholecalciferol  200 Units Oral BID  . escitalopram  10 mg Oral Daily  . feeding supplement (PRO-STAT SUGAR FREE 64)  30 mL Per Tube BID  . furosemide  60 mg Intravenous Q8H  . insulin aspart  0-9 Units Subcutaneous Q4H  . levothyroxine  88 mcg Oral Q0600  . lidocaine      . LORazepam  0.5 mg Intravenous Once  . magnesium oxide  400 mg Oral BID  . multivitamin with minerals  1 tablet Oral Daily  . ondansetron  4 mg Intravenous Q8H  . pantoprazole  40 mg Oral BID  . sodium bicarbonate  650 mg Oral BID   Continuous Infusions: .  albumin human 50 g (01/25/19 1229)  . feeding supplement (JEVITY 1.5 CAL/FIBER) Stopped (01/24/19 1939)     LOS: 18 days   Marylu Lund, MD [facility] Pager On Amion  If 7PM-7AM, please contact night-coverage 01/25/2019, 3:02 PM

## 2019-01-26 LAB — COMPREHENSIVE METABOLIC PANEL
ALT: 13 U/L (ref 0–44)
AST: 37 U/L (ref 15–41)
Albumin: 4 g/dL (ref 3.5–5.0)
Alkaline Phosphatase: 119 U/L (ref 38–126)
Anion gap: 10 (ref 5–15)
BUN: 56 mg/dL — ABNORMAL HIGH (ref 8–23)
CO2: 26 mmol/L (ref 22–32)
Calcium: 8.8 mg/dL — ABNORMAL LOW (ref 8.9–10.3)
Chloride: 108 mmol/L (ref 98–111)
Creatinine, Ser: 2.74 mg/dL — ABNORMAL HIGH (ref 0.44–1.00)
GFR calc Af Amer: 20 mL/min — ABNORMAL LOW (ref >60)
GFR calc non Af Amer: 17 mL/min — ABNORMAL LOW (ref >60)
Glucose, Bld: 81 mg/dL (ref 70–99)
Potassium: 2.9 mmol/L — ABNORMAL LOW (ref 3.5–5.1)
Sodium: 144 mmol/L (ref 135–145)
Total Bilirubin: 3.1 mg/dL — ABNORMAL HIGH (ref 0.3–1.2)
Total Protein: 5.7 g/dL — ABNORMAL LOW (ref 6.5–8.1)

## 2019-01-26 LAB — CBC
HCT: 21.9 % — ABNORMAL LOW (ref 36.0–46.0)
Hemoglobin: 7 g/dL — ABNORMAL LOW (ref 12.0–15.0)
MCH: 32.3 pg (ref 26.0–34.0)
MCHC: 32 g/dL (ref 30.0–36.0)
MCV: 100.9 fL — ABNORMAL HIGH (ref 80.0–100.0)
Platelets: 141 10*3/uL — ABNORMAL LOW (ref 150–400)
RBC: 2.17 MIL/uL — ABNORMAL LOW (ref 3.87–5.11)
RDW: 21.8 % — ABNORMAL HIGH (ref 11.5–15.5)
WBC: 8 10*3/uL (ref 4.0–10.5)
nRBC: 0 % (ref 0.0–0.2)

## 2019-01-26 LAB — GLUCOSE, CAPILLARY
Glucose-Capillary: 104 mg/dL — ABNORMAL HIGH (ref 70–99)
Glucose-Capillary: 124 mg/dL — ABNORMAL HIGH (ref 70–99)
Glucose-Capillary: 72 mg/dL (ref 70–99)
Glucose-Capillary: 77 mg/dL (ref 70–99)
Glucose-Capillary: 85 mg/dL (ref 70–99)
Glucose-Capillary: 97 mg/dL (ref 70–99)
Glucose-Capillary: 99 mg/dL (ref 70–99)

## 2019-01-26 LAB — MAGNESIUM: Magnesium: 1.9 mg/dL (ref 1.7–2.4)

## 2019-01-26 MED ORDER — POTASSIUM CHLORIDE 10 MEQ/100ML IV SOLN
10.0000 meq | INTRAVENOUS | Status: AC
  Administered 2019-01-26 (×4): 10 meq via INTRAVENOUS
  Filled 2019-01-26 (×4): qty 100

## 2019-01-26 MED ORDER — POTASSIUM CHLORIDE CRYS ER 20 MEQ PO TBCR
40.0000 meq | EXTENDED_RELEASE_TABLET | Freq: Once | ORAL | Status: AC
  Administered 2019-01-26: 40 meq via ORAL
  Filled 2019-01-26: qty 2

## 2019-01-26 MED ORDER — JEVITY 1.5 CAL/FIBER PO LIQD
1000.0000 mL | ORAL | Status: DC
  Filled 2019-01-26: qty 1000

## 2019-01-26 MED ORDER — PHENOL 1.4 % MT LIQD
1.0000 | OROMUCOSAL | Status: DC | PRN
  Filled 2019-01-26 (×2): qty 177

## 2019-01-26 NOTE — Progress Notes (Signed)
GI PA [facility] aware of pt's removal of NGT. She will speak with MD Pyrtle about plan from here. MD Marylyn Ishihara also aware of events.

## 2019-01-26 NOTE — Progress Notes (Signed)
Patient ID: Kaylee Rasmussen, female   DOB: 1951/06/21, 69 y.o.   MRN: [id number]    Progress Note   Subjective  Day # 20 Husband at bedside today, patient was trying to eat breakfast, primarily taking liquids, says she has much less nausea but still no appetite.  3 diarrheal stools in the past 24 hours.  No current complaints of abdominal pain, sometimes gets pain after eating. K 2.9 Creat 2.7 stable HGB 7.0, plts 141 On IV Albumin, Lasix daily per nephrology   Objective   Vital signs in last 24 hours: Temp:  [97.8 F (36.6 C)-98.4 F (36.9 C)] 98.2 F (36.8 C) (08/05 0432) Pulse Rate:  [82-90] 83 (08/05 0432) Resp:  [16-24] 20 (08/05 0432) BP: (120-136)/(59-72) 136/72 (08/05 0432) SpO2:  [93 %-98 %] 98 % (08/05 0432) Weight:  [73.6 kg] 73.6 kg (08/05 0432) Last BM Date: 01/25/19 General: Older   white female in NAD Heart:  Regular rate and rhythm; no murmurs Lungs: Respirations even and unlabored, lungs CTA bilaterally Abdomen: Non-tense ascites, no focal tenderness, there is pitting edema in the lower abdominal wall and into the flanks, normal bowel sounds. Extremities: Decreased peripheral edema Neurologic:  Alert and oriented,  grossly normal neurologically. Psych:  Cooperative. Normal mood and affect somewhat flat  Intake/Output from previous day: 08/04 0701 - 08/05 0700 In: 322.5 [P.O.:120; IV Piggyback:202.5] Out: 1450 [Urine:1450] Intake/Output this shift: No intake/output data recorded.  Lab Results: Recent Labs    01/24/19 0350 01/25/19 0507 01/26/19 0446  WBC 8.1 7.7 8.0  HGB 8.7* 7.7* 7.0*  HCT 28.2* 24.7* 21.9*  PLT 155 151 141*   BMET Recent Labs    01/24/19 0350 01/25/19 0507 01/26/19 0446  NA 141 144 144  K 4.1 3.5 2.9*  CL 110 111 108  CO2 24 24 26   GLUCOSE 95 104* 81  BUN 59* 58* 56*  CREATININE 2.78* 2.77* 2.74*  CALCIUM 7.8* 8.5* 8.8*   LFT Recent Labs    01/26/19 0446  PROT 5.7*  ALBUMIN 4.0  AST 37  ALT 13  ALKPHOS 119    BILITOT 3.1*   PT/INR No results for input(s): LABPROT, INR in the last 72 hours.  Studies/Results: US Renal  Result Date: 01/25/2019 CLINICAL DATA:  Acute kidney injury EXAM: RENAL / URINARY TRACT ULTRASOUND COMPLETE COMPARISON:  CT 01/06/2019 FINDINGS: Right Kidney: Renal measurements: 9.0 x 4.7 x 4.2 cm = volume: 92 mL . Echogenicity within normal limits. No shadowing stone, mass, or hydronephrosis visualized. Left Kidney: Renal measurements: 8.6 x 4.8 x 4.7 cm = volume: 102 mL. Echogenicity within normal limits. No shadowing stone, mass, or hydronephrosis visualized. Bladder: Appears normal for degree of bladder distention. Other: Limited views of the liver demonstrate diffusely echogenic hepatic parenchyma. Moderate volume ascites. IMPRESSION: 1. Unremarkable sonographic appearance of the kidneys without shadowing stone or hydronephrosis. 2. Moderate volume ascites. 3. Hepatic steatosis. Electronically Signed   By: Duanne Guess M.D.   On: 01/25/2019 10:34   US Paracentesis  Result Date: 01/25/2019 INDICATION: Patient with history of rheumatoid arthritis, chronic kidney disease, prior CMV colitis, failure to thrive, cirrhosis by imaging, recurrent ascites. Request made for diagnostic paracentesis. EXAM: ULTRASOUND GUIDED DIAGNOSTIC PARACENTESIS MEDICATIONS: None COMPLICATIONS: None immediate. PROCEDURE: Informed written consent was obtained from the patient after a discussion of the risks, benefits and alternatives to treatment. A timeout was performed prior to the initiation of the procedure. Initial ultrasound scanning demonstrates a small to moderate amount of ascites within the right  lower abdominal quadrant. The right lower abdomen was prepped and draped in the usual sterile fashion. 1% lidocaine was used for local anesthesia. Following this, a 19 gauge, 7-cm, Yueh catheter was introduced. An ultrasound image was saved for documentation purposes. The paracentesis was performed. The catheter  was removed and a dressing was applied. The patient tolerated the procedure well without immediate post procedural complication. FINDINGS: A total of approximately 250 cc of clear, light yellow fluid was removed. Samples were sent to the laboratory as requested by the clinical team. IMPRESSION: Successful ultrasound-guided diagnostic paracentesis yielding 250 cc of peritoneal fluid. Read by: Jeananne Rama, PA-C Electronically Signed   By: Judie Petit.  Shick M.D.   On: 01/25/2019 11:37   US Abdominal Pelvic Art/vent Flow Doppler  Result Date: 01/25/2019 CLINICAL DATA:  Inpatient.  Cirrhosis. EXAM: DUPLEX ULTRASOUND OF LIVER AND TIPS SHUNT TECHNIQUE: Color and duplex Doppler ultrasound was performed to evaluate the hepatic in-flow and out-flow vessels. COMPARISON:  01/21/2019 liver Doppler. FINDINGS: Portal Vein Velocities Main: Minimal to-and-fro flow identified within the main portal vein, approximately 10 cm/sec. No discrete main portal vein thrombosis identified on the grayscale images. Right:  9 cm/sec Left:  12 cm/sec Hepatopetal flow in the right and left portal veins. IVC: Patent with normal respiratory phasicity. Hepatic Vein Velocities Right:  70 cm/sec Mid:  43 cm/sec Left:  40 cm/sec Patent hepatic veins with appropriate flow directions. Splenic Vein: Minimal to and fro flow identified within the splenic vein approximately 10 cm/sec. Hepatic Artery: 115 Ascities: Moderate volume ascites, most prominent in the right lower quadrant. Varices: Patent paraumbilical varix identified with hepatofugal flow. Other findings: Liver surface is diffusely irregular and liver parenchyma is diffusely coarsened and echogenic, compatible with cirrhosis with possible superimposed steatosis. No liver mass demonstrated. IMPRESSION: 1. Hepatic cirrhosis. Moderate volume ascites, most prominent in the right lower quadrant. 2. Minimal to and fro flow within the main portal and splenic veins. No discrete thrombosis appreciated. 3. Slow  hepatopetal flow within the right and left portal veins. 4. Patent paraumbilical varix with hepatofugal flow. Electronically Signed   By: Delbert Phenix M.D.   On: 01/25/2019 17:33       Assessment / Plan:    #71 68 year old white female, with history of rheumatoid arthritis, chronic kidney disease, prior history of necrotizing glomerulonephritis, hypertension and COPD who was admitted 20 days ago with persistent somewhat worsening diarrhea over the past 3 to 4 months, general failure to thrive with weight loss and progressive weakness. 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  patient had been on Humira for about 5 years for rheumatoid arthritis-apparently this had been stopped prior to diagnosis of CMV colitis.  Colonoscopy here with biopsy showing mild nonspecific active colitis, cannot rule out IBD. She is currently on budesonide 9 mg/day over the past week, and diarrhea has improved.  #2 persistent failure to thrive with severe protein calorie malnutrition. Patient has been anorexic and unable to take in much p.o. though she has tried.  Core tract feeding tube was placed on 01/24/2019 but became dislodged that same day.  Tube was left out yesterday, patient now agreeable to having tube replaced as she understand that she is not able to take in enough nutrition to support herself currently. We will also continue oral feedings  #3 nausea-probably multifactoral, clean-based duodenal ulcer, and distal esophagitis on EGD Continue Protonix 40 mg p.o. daily, and around-the-clock  Zofran Nausea has significantly improved  #4 acute kidney injury, superimposed on chronic kidney disease.  Urine sodium less than 10 on 01/24/2019.  Nephrology is following and feels she does have HRS Currently treating with daily IV albumin and 3 times daily Lasix 60 mg Renal function stable but no significant improvement Urine  output has been good  #5 anasarca-avoiding IV fluids-diuretic management per nephrology #6 new diagnosis of cirrhosis, unclear etiology now decompensated with ascites and anasarca Viral, autoimmune and hereditary markers all negative.  This may be secondary to fatty liver disease, or possibly methotrexate which she had been on for over 20 years for RA. Liver biopsy could be considered but unlikely to change management  Patient was seen with Dr. Rhea Belton today, long discussion with patient and her husband and then patient's husband separately.  Patient's husband is aware that she is quite ill and prognosis is guarded.  Multiple questions answered regarding cirrhosis which is why they were unaware of prior to this admission, malnutrition and renal dysfunction. Continue all supportive management, will try to work towards improving her nutrition over the next few weeks with tube feedings if she is able to tolerate.  #7 anemia, chronic with drift in hemoglobin-transfuse for hemoglobin less than 7. No iron deficiency, more consistent with anemia of chronic disease.   Principal Problem:   ARF (acute renal failure) (HCC) Active Problems:   Rheumatoid arthritis involving multiple sites with positive rheumatoid factor (HCC)   History of hypertension   History of hypothyroidism   Abnormal liver function   Diarrhea   Pulmonary nodule   Metabolic acidosis   Severe protein-calorie malnutrition (HCC)   Rectal bleeding   LFTs abnormal   Hepatic cirrhosis (HCC)   Pancreatic insufficiency   Ascites   AKI (acute kidney injury) (HCC)     LOS: 19 days   Betul Brisky PA-C 01/26/2019, 8:57 AM

## 2019-01-26 NOTE — Progress Notes (Signed)
MD Marylyn Ishihara made aware of patient's K level 2.9 during AM rounds at pt's bedside. MD also paged requesting replacement this afternoon. Awaiting orders at this time.

## 2019-01-26 NOTE — Progress Notes (Signed)
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  She was admitted for blood in stool and chronic diarrhea and abnormal liver function. She's now s/p colonoscopy with GI   Assessment & Plan:   Principal Problem:   ARF (acute renal failure) (HCC) Active Problems:   Rheumatoid arthritis involving multiple sites with positive rheumatoid factor (HCC)   History of hypertension   History of hypothyroidism   Abnormal liver function   Diarrhea   Pulmonary nodule   Metabolic acidosis   Severe protein-calorie malnutrition (HCC)   Rectal bleeding   LFTs abnormal   Hepatic cirrhosis (HCC)   Pancreatic insufficiency   Ascites   AKI (acute kidney injury) ([employer])   Blood in stool, CMV colitis     - GI was consulted, appreciate recommendations     - Colonoscopy was performed with findings of localized inflammation at ileocecal valve, external and internal hemorrhoids; see report     -CMV found to be negative     - No report of further bleeding noted currently  Abnormal liver function with hepatorenal syndrome     - GI following     - Earlier MRCP demonstrated severe diffuse hepatic steatosis (can't exclude cirrhosis) - solitary subcentimeter R liver lesion, likely benign cyst      - Unclear etiology, although had been on methotrexate     - Large ascites and worsening renal function concerning for hepatorenal syndrome, see below     - Pt is s/p large volume paracentesis yielding 4.5L on 7/22     - Liver US performed: 1. Hepatic  cirrhosis. Moderate volume ascites, most prominent in the right lower quadrant. 2. Minimal to and fro flow within the main portal and splenic veins. No discrete thrombosis appreciated. 3. Slow hepatopetal flow within the right and left portal veins. 4. Patent paraumbilical varix with hepatofugal flow.     - s/p diagnosis paracentesis 01/25/2019: awaiting results  Chronic diarrhea     - gliadin antibodies - wnl, TTG - normal     - Cholestyramine prn     - Initially resolved with colestid BID, imodium, and creon     - Colestid stopped 7/31 as it may contribute to nausea, also to get a better assessment of diarrhea     - Entocort started 7/31  Community Acquired Pneumonia     - Bld Cx Neg    - CXR with patchy airspace dz in L lung base (early pneumonia vs sequelae of aspiration)     - Repeat CXR with small bilateral effusions and associated atelectasis vs consolidation     - Completed course of Ceftriaxone/azithro x 5 days     - Remains afebrile  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     - Concern for hepatorenal syndrome     - Baseline Cr around 1.5     - SCr currently 2.74     - nephro onboard; IV albumin/lasix  Hypotension/Hx of Hypertension     - Continue holding antihypertensives for now     - BP remains stable  Non- AG acidosis     -  resolved  N/V     - Zofran 4mg  iv q6h prn     - Colestid recently stopped by GI as it may contribute to nausea     - presently stable at this time     - NGT to be placed today for nutrition  Hypothyroidism     - Cont Levothyroxine 88 micrograms po qday as tolerated  Anxiety     - Cont Lexapro 10mg  po qday as pt tolerates     - Currently stable  Normocytic Anemia     - stable, no frank bleed noted     - Labs reviewed. Hgb stable   RA    - Currently off Humira, follows with Deveshwar    - continue with Voltaren gel as needed  Pulmonary Nodule     - Recommended outpatient follow up  Hypoglycemia     - stable, monitor, encourage  diet  Hypomagnesemia/Hypophospatemia     - replete, monitor  Debility     - PT recs SNF; Plan SNF when medically cleared   Toxic metabolic encephalopathy     - resolved  Anasarca     - BLE edema and signs of volume overload     - 2d echo reviewed, normal LVEF     - Given trial of lasix with some improvement. Lasix stopped given concerns of intravascular depletion and dehydration     - Suspect anasarca related to marked malnutrition per above     - GI recommendation for Cortrak, however pt pulled tube overnight. OK to leave out for now per GI     - Cont lasix with albumin per Nephrology  Severe protein calorie malnutrition     - anorexia for several months noted     - Likely contributing to anasarca above     - Encourage PO intake. NG tube lost overnight - OK to leave out for today per GI     - NGT to be replaced  NGT to be replaced. Appreciate GI and nephro assistance. Continue as above.   DVT prophylaxis: SCDs Code Status: FULL Family Communication: Spoke with husband by phone   Disposition Plan: To SNF when stable  Consultants:   GI  IR  Procedures:   Colonoscopy  EGD    ROS:  Reports continued N, generally tired. Denies CP/dyspnea. Remainder 10-pt ROS is negative for all not previously mentioned.  Subjective: "I am. I am still here."  Objective: Vitals:   01/25/19 1307 01/25/19 2015 01/26/19 0432 01/26/19 1004  BP: 134/67 (!) 120/59 136/72 (!) 144/75  Pulse: 83 90 83 90  Resp: 16 (!) 24 20 (!) 28  Temp: 97.8 F (36.6 C) 98.1 F (36.7 C) 98.2 F (36.8 C) 98.1 F (36.7 C)  TempSrc: Oral  Oral Oral  SpO2: 93% 94% 98% 96%  Weight:   73.6 kg   Height:        Intake/Output Summary (Last 24 hours) at 01/26/2019 1427 Last data filed at 01/26/2019 0600 Gross per 24 hour  Intake 320 ml  Output 800 ml  Net -480 ml   Filed Weights   01/24/19 0500 01/25/19 0500 01/26/19 0432  Weight: 76.4 kg 74.6 kg 73.6 kg    Examination:  General: 68 y.o.  female resting in bed in NAD; seems tired Eyes: PERRL, normal sclera ENMT: Nares patent w/o discharge, orophaynx clear, dentition normal, ears w/o discharge/lesions/ulcers Cardiovascular: RRR, +S1, S2, no m/g/r, equal pulses throughout Respiratory: CTABL, no w/r/r, normal WOB GI: BS+, NT,  distended but soft , no masses noted, no organomegaly noted MSK: ble edema Skin: No rashes, bruises, ulcerations noted Neuro: alert to name, follows commands, no focal deficits Psyc: Appropriate interaction and affect, calm/cooperative   Data Reviewed: I have personally reviewed following labs and imaging studies.  CBC: Recent Labs  Lab 01/22/19 0348 01/24/19 0350 01/25/19 0507 01/26/19 0446  WBC 14.2* 8.1 7.7 8.0  HGB 8.8* 8.7* 7.7* 7.0*  HCT 28.0* 28.2* 24.7* 21.9*  MCV 100.0 100.0 100.0 100.9*  PLT 159 155 151 676*   Basic Metabolic Panel: Recent Labs  Lab 01/22/19 0348 01/23/19 0455 01/24/19 0350 01/24/19 1551 01/25/19 0507 01/25/19 1725 01/26/19 0446  NA 139 139 141  --  144  --  144  K 4.5 4.2 4.1  --  3.5  --  2.9*  CL 107 108 110  --  111  --  108  CO2 26 22 24   --  24  --  26  GLUCOSE 119* 98 95  --  104*  --  81  BUN 55* 56* 59*  --  58*  --  56*  CREATININE 2.83* 2.73* 2.78*  --  2.77*  --  2.74*  CALCIUM 7.9* 7.7* 7.8*  --  8.5*  --  8.8*  MG  --   --  2.0  --  2.0  --  1.9  PHOS  --   --   --  3.2 3.4 3.5  --    GFR: Estimated Creatinine Clearance: 18.3 mL/min (A) (by C-G formula based on SCr of 2.74 mg/dL (H)). Liver Function Tests: Recent Labs  Lab 01/22/19 0348 01/23/19 0455 01/24/19 0350 01/25/19 0507 01/26/19 0446  AST 57* 59* 65* 53* 37  ALT 15 15 17 15 13   ALKPHOS 177* 187* 205* 169* 119  BILITOT 2.7* 2.0* 1.9* 2.4* 3.1*  PROT 4.8* 4.6* 4.6* 5.3* 5.7*  ALBUMIN 1.7* 1.7* 1.6* 3.0* 4.0   No results for input(s): LIPASE, AMYLASE in the last 168 hours. No results for input(s): AMMONIA in the last 168 hours. Coagulation Profile: Recent Labs  Lab  01/21/19 0517  INR 1.2   Cardiac Enzymes: No results for input(s): CKTOTAL, CKMB, CKMBINDEX, TROPONINI in the last 168 hours. BNP (last 3 results) No results for input(s): PROBNP in the last 8760 hours. HbA1C: Recent Labs    01/24/19 0350  HGBA1C 4.1*   CBG: Recent Labs  Lab 01/25/19 2012 01/26/19 0013 01/26/19 0428 01/26/19 0821 01/26/19 1213  GLUCAP 93 85 72 77 99   Lipid Profile: No results for input(s): CHOL, HDL, LDLCALC, TRIG, CHOLHDL, LDLDIRECT in the last 72 hours. Thyroid Function Tests: No results for input(s): TSH, T4TOTAL, FREET4, T3FREE, THYROIDAB in the last 72 hours. Anemia Panel: No results for input(s): VITAMINB12, FOLATE, FERRITIN, TIBC, IRON, RETICCTPCT in the last 72 hours. Sepsis Labs: No results for input(s): PROCALCITON, LATICACIDVEN in the last 168 hours.  Recent Results (from the past 240 hour(s))  Novel Coronavirus, NAA (hospital order; send-out to ref lab)     Status: None   Collection Time: 01/17/19  7:37 PM   Specimen: Nasopharyngeal Swab; Respiratory  Result Value Ref Range Status   SARS-CoV-2, NAA NOT DETECTED NOT DETECTED Final    Comment: (NOTE) This test was developed and its performance characteristics determined by [employer]. This test has not been FDA cleared or approved. This test has been authorized by FDA under an Emergency Use Authorization (EUA). This test is only authorized for the duration of time the  declaration that circumstances exist justifying the authorization of the emergency use of in vitro diagnostic tests for detection of SARS-CoV-2 virus and/or diagnosis of COVID-19 infection under section 564(b)(1) of the Act, 21 U.S.C. 494WHQ-7(R)(9), unless the authorization is terminated or revoked sooner. When diagnostic testing is negative, the possibility of a false negative result should be considered in the context of a patient's recent exposures and the presence of clinical signs and symptoms consistent  with COVID-19. An individual without symptoms of COVID-19 and who is not shedding SARS-CoV-2 virus would expect to have a negative (not detected) result in this assay. Performed  At: [facility] [place], [place] [id number] Rush Farmer MD DJ:[id number]    [place]  Final    Comment: Performed at [place] [address] [place], [place] [address]  Body fluid culture     Status: None (Preliminary result)   Collection Time: 01/25/19 11:48 AM   Specimen: Peritoneal Washings; Body Fluid  Result Value Ref Range Status   Specimen Description   Final    PERITONEAL Performed at [facility], [place] [address] [place], [place] [address]    Special Requests   Final    NONE Performed at [facility], [place] [address] [place], [place] [address]    Gram Stain   Final    WBC PRESENT, PREDOMINANTLY MONONUCLEAR NO ORGANISMS SEEN CYTOSPIN SMEAR    Culture   Final    NO GROWTH < 24 HOURS Performed at [place] [address] [place], [place] [address]    Report Status PENDING  Incomplete      Radiology Studies: US Renal  Result Date: 01/25/2019 CLINICAL DATA:  Acute kidney injury EXAM: RENAL / URINARY TRACT ULTRASOUND COMPLETE COMPARISON:  CT 01/06/2019 FINDINGS: Right Kidney: Renal measurements: 9.0 x 4.7 x 4.2 cm = volume: 92 mL . Echogenicity within normal limits. No shadowing stone, mass, or hydronephrosis visualized. Left Kidney: Renal measurements: 8.6 x 4.8 x 4.7 cm = volume: 102 mL. Echogenicity within normal limits. No shadowing stone, mass, or hydronephrosis visualized. Bladder: Appears normal for degree of bladder distention. Other: Limited views of the liver demonstrate diffusely echogenic hepatic parenchyma. Moderate volume ascites. IMPRESSION: 1. Unremarkable sonographic appearance of the kidneys without shadowing stone or hydronephrosis. 2. Moderate volume ascites. 3. Hepatic  steatosis. Electronically Signed   By: Davina Poke M.D.   On: 01/25/2019 10:34   US Paracentesis  Result Date: 01/25/2019 INDICATION: Patient with history of rheumatoid arthritis, chronic kidney disease, prior CMV colitis, failure to thrive, cirrhosis by imaging, recurrent ascites. Request made for diagnostic paracentesis. EXAM: ULTRASOUND GUIDED DIAGNOSTIC PARACENTESIS MEDICATIONS: None COMPLICATIONS: None immediate. PROCEDURE: Informed written consent was obtained from the patient after a discussion of the risks, benefits and alternatives to treatment. A timeout was performed prior to the initiation of the procedure. Initial ultrasound scanning demonstrates a small to moderate amount of ascites within the right lower abdominal quadrant. The right lower abdomen was prepped and draped in the usual sterile fashion. 1% lidocaine was used for local anesthesia. Following this, a 19 gauge, 7-cm, Yueh catheter was introduced. An ultrasound image was saved for documentation purposes. The paracentesis was performed. The catheter was removed and a dressing was applied. The patient tolerated the procedure well without immediate post procedural complication. FINDINGS: A total of approximately 250 cc of clear, light yellow fluid was removed. Samples were sent to the laboratory as requested by the clinical team.  IMPRESSION: Successful ultrasound-guided diagnostic paracentesis yielding 250 cc of peritoneal fluid. Read by: Rowe Robert, PA-C Electronically Signed   By: Jerilynn Mages.  Shick M.D.   On: 01/25/2019 11:37   US Abdominal Pelvic Art/vent Flow Doppler  Result Date: 01/25/2019 CLINICAL DATA:  Inpatient.  Cirrhosis. EXAM: DUPLEX ULTRASOUND OF LIVER AND TIPS SHUNT TECHNIQUE: Color and duplex Doppler ultrasound was performed to evaluate the hepatic in-flow and out-flow vessels. COMPARISON:  01/21/2019 liver Doppler. FINDINGS: Portal Vein Velocities Main: Minimal to-and-fro flow identified within the main portal vein,  approximately 10 cm/sec. No discrete main portal vein thrombosis identified on the grayscale images. Right:  9 cm/sec Left:  12 cm/sec Hepatopetal flow in the right and left portal veins. IVC: Patent with normal respiratory phasicity. Hepatic Vein Velocities Right:  70 cm/sec Mid:  43 cm/sec Left:  40 cm/sec Patent hepatic veins with appropriate flow directions. Splenic Vein: Minimal to and fro flow identified within the splenic vein approximately 10 cm/sec. Hepatic Artery: 115 Ascities: Moderate volume ascites, most prominent in the right lower quadrant. Varices: Patent paraumbilical varix identified with hepatofugal flow. Other findings: Liver surface is diffusely irregular and liver parenchyma is diffusely coarsened and echogenic, compatible with cirrhosis with possible superimposed steatosis. No liver mass demonstrated. IMPRESSION: 1. Hepatic cirrhosis. Moderate volume ascites, most prominent in the right lower quadrant. 2. Minimal to and fro flow within the main portal and splenic veins. No discrete thrombosis appreciated. 3. Slow hepatopetal flow within the right and left portal veins. 4. Patent paraumbilical varix with hepatofugal flow. Electronically Signed   By: Ilona Sorrel M.D.   On: 01/25/2019 17:33     Scheduled Meds:  budesonide  9 mg Oral Daily   calcium carbonate  500 mg of elemental calcium Oral BID   And   cholecalciferol  200 Units Oral BID   escitalopram  10 mg Oral Daily   feeding supplement (JEVITY 1.5 CAL/FIBER)  1,000 mL Per Tube Q24H   feeding supplement (PRO-STAT SUGAR FREE 64)  30 mL Per Tube BID   furosemide  60 mg Intravenous Q8H   insulin aspart  0-9 Units Subcutaneous Q4H   levothyroxine  88 mcg Oral Q0600   magnesium oxide  400 mg Oral BID   multivitamin with minerals  1 tablet Oral Daily   ondansetron  4 mg Intravenous Q8H   pantoprazole  40 mg Oral BID   sodium bicarbonate  650 mg Oral BID   Continuous Infusions:   LOS: 19 days    Time spent:  25 minutes spent in the coordination of care today.   Jonnie Finner, DO [facility] Pager [phone]  If 7PM-7AM, please contact night-coverage [email or web address] Password [email or web address] 01/26/2019, 2:27 PM

## 2019-01-26 NOTE — Progress Notes (Signed)
[facility] Progress Note  Subjective: pt is sleepy today, denies c/o's - UOP better w/ IV lasix , 1.4 L UOP yest  - wt's down 76 >> 73kg  (admit 63kg)  Vitals:   01/25/19 1307 01/25/19 2015 01/26/19 0432 01/26/19 1004  BP: 134/67 (!) 120/59 136/72 (!) 144/75  Pulse: 83 90 83 90  Resp: 16 (!) 24 20 (!) 28  Temp: 97.8 F (36.6 C) 98.1 F (36.7 C) 98.2 F (36.8 C) 98.1 F (36.7 C)  TempSrc: Oral  Oral Oral  SpO2: 93% 94% 98% 96%  Weight:   73.6 kg   Height:        Inpatient medications: . budesonide  9 mg Oral Daily  . calcium carbonate  500 mg of elemental calcium Oral BID   And  . cholecalciferol  200 Units Oral BID  . escitalopram  10 mg Oral Daily  . feeding supplement (PRO-STAT SUGAR FREE 64)  30 mL Per Tube BID  . furosemide  60 mg Intravenous Q8H  . insulin aspart  0-9 Units Subcutaneous Q4H  . levothyroxine  88 mcg Oral Q0600  . magnesium oxide  400 mg Oral BID  . multivitamin with minerals  1 tablet Oral Daily  . ondansetron  4 mg Intravenous Q8H  . pantoprazole  40 mg Oral BID  . sodium bicarbonate  650 mg Oral BID   . albumin human 12.5 g (01/26/19 1051)  . feeding supplement (JEVITY 1.5 CAL/FIBER) Stopped (01/24/19 1939)   acetaminophen, albuterol, diclofenac sodium, loperamide, morphine injection, ondansetron, oxyCODONE, sodium chloride flush, sodium chloride flush    Exam: Gen alert, chron ill appearing No rash, cyanosis or gangrene Sclera anicteric, throat clear  No jvd or bruits Chest clear bilat except some dec'd BS L base RRR no MRG Abd soft ntnd 2-3+ ascites diffusely MS no joint effusions or deformity Ext 2-3+ diffuse ext edema, no wounds or ulcer Neuro is alert, Ox 3 , nf , no asterixis    Home meds:  - amlodipine 2.5/ metoprolol xl 100 qd  - escitalopram 10 qd  - sod bicarb bid/ Kdur 10 qd/ proair hfa prn/ levothyroxine 88 qd  - prn's/ vitamins/ supplements   CT abd /pelvis 01/06/19 > 1. Abnormal appearance of the  liver with heterogeneous hepatic steatosis and heterogeneous liver enhancement. Questionable fine liver surface irregularity. Cannot exclude hepatic cirrhosis. Nodiscrete liver masses. Recommend correlation with liver enzymes. 2. Third-spacing of fluid with moderate ascites, small dependent bilateral pleural effusions, right greater than left, and mild anasarca. 3. No evidence of bowel obstruction or acute bowel inflammation. Mild colonic diverticulosis, without convincing findings of acute diverticulitis. 4. Patchy bandlike consolidation in the left lung base, cannot exclude pneumonia or aspiration.    UA 7/21 > negative  UNa 8/2 <10 Renal US (8/4) 8.6 L and  9 cm R kidneys w/o hydro bilat  Assessment/ Plan:  AKI on CKD 3/4- suspect hepatorenal picture w/ low UOP, very low alb/ low UNa and severe ascites. She is vol overloaded w/ diffuse edema/ anasarca and sig ascites. Undergoing trial of diuresis w/ IV lasix and IV albumin. So far is tolerating IV lasix and wt's are down 2kg , still 10kg up from admit.  Renal prognosis is not good with underlying sig CKD stage 3/4.   CKD 3b - baseline creat 1.5, eGFR 32, due to her known hx of ANCA+ GN treated 2019  Ascites/ fatty liver - GI is following  Vol overload - as above, avoid any  IVF's except D5W or D10 if needed for low BS     [facility] 01/26/2019, 11:03 AM  Iron/TIBC/Ferritin/ %Sat    Component Value Date/Time   IRON 81 01/07/2019 0928   TIBC NOT CALCULATED 01/07/2019 0928   FERRITIN 498 (H) 01/07/2019 0928   IRONPCTSAT NOT CALCULATED 01/07/2019 0928   Recent Labs  Lab 01/21/19 0517  01/25/19 1725 01/26/19 0446  NA 138   < >  --  144  K 4.5   < >  --  2.9*  CL 106   < >  --  108  CO2 25   < >  --  26  GLUCOSE 84   < >  --  81  BUN 46*   < >  --  56*  CREATININE 2.73*   < >  --  2.74*  CALCIUM 8.1*   < >  --  8.8*  PHOS  --    < > 3.5  --   ALBUMIN 1.7*   < >  --  4.0  INR 1.2  --   --   --    <  > = values in this interval not displayed.   Recent Labs  Lab 01/26/19 0446  AST 37  ALT 13  ALKPHOS 119  BILITOT 3.1*  PROT 5.7*   Recent Labs  Lab 01/26/19 0446  WBC 8.0  HGB 7.0*  HCT 21.9*  PLT 141*

## 2019-01-26 NOTE — Progress Notes (Signed)
Called to place small bore NGT. Patient requesting numbing spray. MD paged and Chloraseptic spray ordered and administered. Spray administered and NGT placed utilizing continuous pulse oximetry and CO2 detection. No desaturation or respiratory distress. Patient tolerated procedure however patient immediately removed tape and tube as RN was securing it. Patient states she wanted to try but it was just too much. Patients husband concerned about nutrition and notified we would let MD know. Bedside RN to page.

## 2019-01-27 ENCOUNTER — Inpatient Hospital Stay: Payer: Self-pay

## 2019-01-27 LAB — CBC WITH DIFFERENTIAL/PLATELET
Abs Immature Granulocytes: 0.29 10*3/uL — ABNORMAL HIGH (ref 0.00–0.07)
Basophils Absolute: 0 10*3/uL (ref 0.0–0.1)
Basophils Relative: 0 %
Eosinophils Absolute: 0 10*3/uL (ref 0.0–0.5)
Eosinophils Relative: 0 %
HCT: 22.8 % — ABNORMAL LOW (ref 36.0–46.0)
Hemoglobin: 7 g/dL — ABNORMAL LOW (ref 12.0–15.0)
Immature Granulocytes: 4 %
Lymphocytes Relative: 12 %
Lymphs Abs: 0.9 10*3/uL (ref 0.7–4.0)
MCH: 31.1 pg (ref 26.0–34.0)
MCHC: 30.7 g/dL (ref 30.0–36.0)
MCV: 101.3 fL — ABNORMAL HIGH (ref 80.0–100.0)
Monocytes Absolute: 0.8 10*3/uL (ref 0.1–1.0)
Monocytes Relative: 11 %
Neutro Abs: 5.2 10*3/uL (ref 1.7–7.7)
Neutrophils Relative %: 73 %
Platelets: 136 10*3/uL — ABNORMAL LOW (ref 150–400)
RBC: 2.25 MIL/uL — ABNORMAL LOW (ref 3.87–5.11)
RDW: 22.3 % — ABNORMAL HIGH (ref 11.5–15.5)
WBC: 7.2 10*3/uL (ref 4.0–10.5)
nRBC: 0 % (ref 0.0–0.2)

## 2019-01-27 LAB — BASIC METABOLIC PANEL
Anion gap: 9 (ref 5–15)
BUN: 52 mg/dL — ABNORMAL HIGH (ref 8–23)
CO2: 26 mmol/L (ref 22–32)
Calcium: 8.7 mg/dL — ABNORMAL LOW (ref 8.9–10.3)
Chloride: 109 mmol/L (ref 98–111)
Creatinine, Ser: 2.64 mg/dL — ABNORMAL HIGH (ref 0.44–1.00)
GFR calc Af Amer: 21 mL/min — ABNORMAL LOW (ref >60)
GFR calc non Af Amer: 18 mL/min — ABNORMAL LOW (ref >60)
Glucose, Bld: 95 mg/dL (ref 70–99)
Potassium: 3.4 mmol/L — ABNORMAL LOW (ref 3.5–5.1)
Sodium: 144 mmol/L (ref 135–145)

## 2019-01-27 LAB — GLUCOSE, CAPILLARY
Glucose-Capillary: 95 mg/dL (ref 70–99)
Glucose-Capillary: 87 mg/dL (ref 70–99)
Glucose-Capillary: 90 mg/dL (ref 70–99)
Glucose-Capillary: 149 mg/dL — ABNORMAL HIGH (ref 70–99)

## 2019-01-27 MED ORDER — POTASSIUM CHLORIDE CRYS ER 20 MEQ PO TBCR
40.0000 meq | EXTENDED_RELEASE_TABLET | Freq: Two times a day (BID) | ORAL | Status: DC
  Administered 2019-01-27 – 2019-01-31 (×9): 40 meq via ORAL
  Filled 2019-01-27 (×9): qty 2

## 2019-01-27 MED ORDER — DRONABINOL 2.5 MG PO CAPS
2.5000 mg | ORAL_CAPSULE | Freq: Two times a day (BID) | ORAL | Status: DC
  Administered 2019-01-27 – 2019-02-08 (×25): 2.5 mg via ORAL
  Filled 2019-01-27 (×26): qty 1

## 2019-01-27 MED ORDER — ALBUMIN HUMAN 25 % IV SOLN
50.0000 g | Freq: Two times a day (BID) | INTRAVENOUS | Status: AC
  Administered 2019-01-27 – 2019-01-29 (×5): 50 g via INTRAVENOUS
  Filled 2019-01-27 (×7): qty 200

## 2019-01-27 MED ORDER — TRAVASOL 10 % IV SOLN
INTRAVENOUS | Status: AC
  Administered 2019-01-27: 18:00:00 via INTRAVENOUS
  Filled 2019-01-27: qty 480

## 2019-01-27 MED ORDER — POTASSIUM CHLORIDE 10 MEQ/100ML IV SOLN
10.0000 meq | Freq: Once | INTRAVENOUS | Status: AC
  Administered 2019-01-27: 10 meq via INTRAVENOUS
  Filled 2019-01-27: qty 100

## 2019-01-27 MED ORDER — JEVITY 1.5 CAL/FIBER PO LIQD
1000.0000 mL | ORAL | Status: DC
  Filled 2019-01-27: qty 1000

## 2019-01-27 MED ORDER — POTASSIUM CHLORIDE 10 MEQ/100ML IV SOLN
10.0000 meq | INTRAVENOUS | Status: AC
  Administered 2019-01-27 (×3): 10 meq via INTRAVENOUS
  Filled 2019-01-27 (×3): qty 100

## 2019-01-27 MED ORDER — INSULIN ASPART 100 UNIT/ML ~~LOC~~ SOLN
0.0000 [IU] | Freq: Four times a day (QID) | SUBCUTANEOUS | Status: DC
  Administered 2019-01-27 – 2019-01-28 (×5): 2 [IU] via SUBCUTANEOUS
  Administered 2019-01-29: 3 [IU] via SUBCUTANEOUS
  Administered 2019-01-29 – 2019-01-30 (×3): 2 [IU] via SUBCUTANEOUS
  Administered 2019-01-30: 3 [IU] via SUBCUTANEOUS
  Administered 2019-01-31: 2 [IU] via SUBCUTANEOUS

## 2019-01-27 MED ORDER — FREE WATER: 50.0000 mL | Freq: Four times a day (QID) | Status: DC

## 2019-01-27 NOTE — Progress Notes (Signed)
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  She was admitted for blood in stool and chronic diarrhea and abnormal liver function. She's now s/p colonoscopy with GI   Assessment & Plan:   Principal Problem:   ARF (acute renal failure) (HCC) Active Problems:   Rheumatoid arthritis involving multiple sites with positive rheumatoid factor (HCC)   History of hypertension   History of hypothyroidism   Abnormal liver function   Diarrhea   Pulmonary nodule   Metabolic acidosis   Severe protein-calorie malnutrition (HCC)   Rectal bleeding   LFTs abnormal   Hepatic cirrhosis (HCC)   Pancreatic insufficiency   Ascites   AKI (acute kidney injury) ([place])   Blood in stool, CMV colitis     - GI was consulted, appreciate recommendations     - Colonoscopy was performed with findings of localized inflammation at ileocecal valve, external and internal hemorrhoids; see report     -CMV found to be negative     - No report of further bleeding noted currently  Abnormal liver function with hepatorenal syndrome     - GI following     - Earlier MRCP demonstrated severe diffuse hepatic steatosis (can't exclude cirrhosis) - solitary subcentimeter R liver lesion, likely benign cyst      - Unclear etiology, although had been on methotrexate     - Large ascites and worsening renal function concerning for hepatorenal syndrome, see below     - Pt is s/p large volume paracentesis yielding 4.5L on 7/22     - Liver US performed: 1. Hepatic  cirrhosis. Moderate volume ascites, most prominent in the right lower quadrant. 2. Minimal to and fro flow within the main portal and splenic veins. No discrete thrombosis appreciated. 3. Slow hepatopetal flow within the right and left portal veins. 4. Patent paraumbilical varix with hepatofugal flow.     - s/p diagnosis paracentesis 01/25/2019: awaiting results  Chronic diarrhea     - gliadin antibodies - wnl, TTG - normal     - Cholestyramine prn     - Initially resolved with colestid BID, imodium, and creon     - Colestid stopped 7/31 as it may contribute to nausea, also to get a better assessment of diarrhea     - Entocort started 7/31  Community Acquired Pneumonia     - Bld Cx Neg    - CXR with patchy airspace dz in L lung base (early pneumonia vs sequelae of aspiration)     - Repeat CXR with small bilateral effusions and associated atelectasis vs consolidation     - Completed course of Ceftriaxone/azithro x 5 days     - Remains afebrile  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     - Concern for hepatorenal syndrome     - Baseline Cr around 1.5     - SCr currently 2.74     - nephro onboard; IV albumin/lasix  Hypotension/Hx of Hypertension     - Continue holding antihypertensives for now     - BP remains stable  Non- AG acidosis     -  resolved  N/V     - Zofran 4mg  iv q6h prn     - Colestid recently stopped by GI as it may contribute to nausea     - presently stable at this time     - NGT to be placed today for nutrition  Hypothyroidism     - Cont Levothyroxine 88 micrograms po qday as tolerated  Anxiety     - Cont Lexapro 10mg  po qday as pt tolerates     - Currently stable  Normocytic Anemia     - stable, no frank bleed noted     - Labs reviewed. Hgb stable   RA    - Currently off Humira, follows with Deveshwar    - continue with Voltaren gel as needed  Pulmonary Nodule     - Recommended outpatient follow up  Hypoglycemia     - stable, monitor, encourage  diet  Hypomagnesemia/Hypophospatemia     - replete, monitor  Debility     - PT recs SNF; Plan SNF when medically cleared   Toxic metabolic encephalopathy     - resolved  Anasarca     - BLE edema and signs of volume overload     - 2d echo reviewed, normal LVEF     - Given trial of lasix with some improvement. Lasix stopped given concerns of intravascular depletion and dehydration     - Suspect anasarca related to marked malnutrition per above     - GI recommendation for Cortrak, however pt pulled tube overnight. OK to leave out for now per GI     - Cont lasix with albumin per Nephrology  Severe protein calorie malnutrition     - anorexia for several months noted     - Likely contributing to anasarca above     - Encourage PO intake. NG tube lost overnight - OK to leave out for today per GI     - NGT was replaced but she did not want it in     - GI to start TPN; PICC in place     - will start Marinol  End point? Will try short course TPN. Spoke with pt/family about risks/benefits in room with GI present. Will add dronabinol. Really should have PC convo.  DVT prophylaxis: SCDs Code Status: FULL Family Communication: Spoke with husband by phone   Disposition Plan: To SNF when stable  Consultants:   GI  IR  Neprhology  Procedures:   Colonoscopy  EGD    ROS:  Reports tiredness, lack of appetite. Denies CP/dyspnea. Remainder 10-pt ROS is negative for all not previously mentioned.  Subjective: "I hear what you are saying."  Objective: Vitals:   01/26/19 2007 01/27/19 0423 01/27/19 1520 01/27/19 1534  BP: (!) 151/83 (!) 149/79  140/75  Pulse: 94 82  86  Resp: 20 20  18   Temp: 98.8 F (37.1 C) 98.4 F (36.9 C)  98.5 F (36.9 C)  TempSrc: Oral Oral  Oral  SpO2: 95% 97%  94%  Weight:  73 kg 70.9 kg   Height:        Intake/Output Summary (Last 24 hours) at 01/27/2019 1551 Last data filed at 01/27/2019 1536 Gross per 24 hour  Intake 753.61 ml  Output  2900 ml  Net -2146.39 ml   Filed Weights   01/26/19 0432 01/27/19 0423 01/27/19 1520  Weight: 73.6 kg 73 kg 70.9 kg    Examination:  General: 68 y.o. female resting in bed in NAD;  frail Eyes: PERRL, normal sclera ENMT: Nares patent w/o discharge, orophaynx clear, dentition normal, ears w/o discharge/lesions/ulcers Cardiovascular: RRR, +S1, S2, no m/g/r, equal pulses throughout Respiratory: CTABL, no w/r/r, normal WOB GI: BS+, NT, distended but soft , no masses noted, no organomegaly noted MSK: ble edema Skin: No rashes, bruises, ulcerations noted Neuro: alert to name, follows commands, no focal deficits Psyc: Appropriate interaction and affect, calm/cooperative   Data Reviewed: I have personally reviewed following labs and imaging studies.  CBC: Recent Labs  Lab 01/22/19 0348 01/24/19 0350 01/25/19 0507 01/26/19 0446 01/27/19 0441  WBC 14.2* 8.1 7.7 8.0 7.2  NEUTROABS  --   --   --   --  5.2  HGB 8.8* 8.7* 7.7* 7.0* 7.0*  HCT 28.0* 28.2* 24.7* 21.9* 22.8*  MCV 100.0 100.0 100.0 100.9* 101.3*  PLT 159 155 151 141* 741*   Basic Metabolic Panel: Recent Labs  Lab 01/23/19 0455 01/24/19 0350 01/24/19 1551 01/25/19 0507 01/25/19 1725 01/26/19 0446 01/27/19 0441  NA 139 141  --  144  --  144 144  K 4.2 4.1  --  3.5  --  2.9* 3.4*  CL 108 110  --  111  --  108 109  CO2 22 24  --  24  --  26 26  GLUCOSE 98 95  --  104*  --  81 95  BUN 56* 59*  --  58*  --  56* 52*  CREATININE 2.73* 2.78*  --  2.77*  --  2.74* 2.64*  CALCIUM 7.7* 7.8*  --  8.5*  --  8.8* 8.7*  MG  --  2.0  --  2.0  --  1.9  --   PHOS  --   --  3.2 3.4 3.5  --   --    GFR: Estimated Creatinine Clearance: 18.6 mL/min (A) (by C-G formula based on SCr of 2.64 mg/dL (H)). Liver Function Tests: Recent Labs  Lab 01/22/19 0348 01/23/19 0455 01/24/19 0350 01/25/19 0507 01/26/19 0446  AST 57* 59* 65* 53* 37  ALT 15 15 17 15 13   ALKPHOS 177* 187* 205* 169* 119  BILITOT 2.7* 2.0* 1.9* 2.4* 3.1*   PROT 4.8* 4.6* 4.6* 5.3* 5.7*  ALBUMIN 1.7* 1.7* 1.6* 3.0* 4.0   No results for input(s): LIPASE, AMYLASE in the last 168 hours. No results for input(s): AMMONIA in the last 168 hours. Coagulation Profile: Recent Labs  Lab 01/21/19 0517  INR 1.2   Cardiac Enzymes: No results for input(s): CKTOTAL, CKMB, CKMBINDEX, TROPONINI in the last 168 hours. BNP (last 3 results) No results for input(s): PROBNP in the last 8760 hours. HbA1C: No results for input(s): HGBA1C in the last 72 hours. CBG: Recent Labs  Lab 01/26/19 2005 01/26/19 2356 01/27/19 0421 01/27/19 0831 01/27/19 1138  GLUCAP 124* 97 95 87 90   Lipid Profile: No results for input(s): CHOL, HDL, LDLCALC, TRIG, CHOLHDL, LDLDIRECT in the last 72 hours. Thyroid Function Tests: No results for input(s): TSH, T4TOTAL, FREET4, T3FREE, THYROIDAB in the last 72 hours. Anemia Panel: No results for input(s): VITAMINB12, FOLATE, FERRITIN, TIBC, IRON, RETICCTPCT in the last 72 hours. Sepsis Labs: No results for input(s): PROCALCITON, LATICACIDVEN in the last 168 hours.  Recent Results (from the past 240 hour(s))  Novel Coronavirus, NAA (hospital order; send-out to ref lab)     Status: None   Collection Time: 01/17/19  7:37 PM   Specimen: Nasopharyngeal Swab; Respiratory  Result Value Ref Range Status   SARS-CoV-2, NAA  NOT DETECTED NOT DETECTED Final    Comment: (NOTE) This test was developed and its performance characteristics determined by [employer]. This test has not been FDA cleared or approved. This test has been authorized by FDA under an Emergency Use Authorization (EUA). This test is only authorized for the duration of time the declaration that circumstances exist justifying the authorization of the emergency use of in vitro diagnostic tests for detection of SARS-CoV-2 virus and/or diagnosis of COVID-19 infection under section 564(b)(1) of the Act, 21 U.S.C. 831DVV-6(H)(6), unless the authorization is  terminated or revoked sooner. When diagnostic testing is negative, the possibility of a false negative result should be considered in the context of a patient's recent exposures and the presence of clinical signs and symptoms consistent with COVID-19. An individual without symptoms of COVID-19 and who is not shedding SARS-CoV-2 virus would expect to have a negative (not detected) result in this assay. Performed  At: [facility] [place], [place] [id number] Rush Farmer MD RS:[id number]    [place]  Final    Comment: Performed at [facility] [address], [place], [place] [address]  Body fluid culture     Status: None (Preliminary result)   Collection Time: 01/25/19 11:48 AM   Specimen: Peritoneal Washings; Body Fluid  Result Value Ref Range Status   Specimen Description   Final    PERITONEAL Performed at [facility], [place] [address], [place], [place] [address]    Special Requests   Final    NONE Performed at [facility], [place] [address], [place], [place] [address]    Gram Stain   Final    WBC PRESENT, PREDOMINANTLY MONONUCLEAR NO ORGANISMS SEEN CYTOSPIN SMEAR    Culture   Final    NO GROWTH 2 DAYS Performed at [facility] [address] [place], [place] [address]    Report Status PENDING  Incomplete      Radiology Studies: US Abdominal Pelvic Art/vent Flow Doppler  Result Date: 01/25/2019 CLINICAL DATA:  Inpatient.  Cirrhosis. EXAM: DUPLEX ULTRASOUND OF LIVER AND TIPS SHUNT TECHNIQUE: Color and duplex Doppler ultrasound was performed to evaluate the hepatic in-flow and out-flow vessels. COMPARISON:  01/21/2019 liver Doppler. FINDINGS: Portal Vein Velocities Main: Minimal to-and-fro flow identified within the main portal vein, approximately 10 cm/sec. No discrete main portal vein thrombosis identified on the grayscale images. Right:  9 cm/sec Left:  12 cm/sec Hepatopetal  flow in the right and left portal veins. IVC: Patent with normal respiratory phasicity. Hepatic Vein Velocities Right:  70 cm/sec Mid:  43 cm/sec Left:  40 cm/sec Patent hepatic veins with appropriate flow directions. Splenic Vein: Minimal to and fro flow identified within the splenic vein approximately 10 cm/sec. Hepatic Artery: 115 Ascities: Moderate volume ascites, most prominent in the right lower quadrant. Varices: Patent paraumbilical varix identified with hepatofugal flow. Other findings: Liver surface is diffusely irregular and liver parenchyma is diffusely coarsened and echogenic, compatible with cirrhosis with possible superimposed steatosis. No liver mass demonstrated. IMPRESSION: 1. Hepatic cirrhosis. Moderate volume ascites, most prominent in the right lower quadrant. 2. Minimal to and fro flow within the main portal and splenic veins. No discrete thrombosis appreciated. 3. Slow hepatopetal flow within the right and left portal veins. 4. Patent paraumbilical varix with hepatofugal flow. Electronically Signed   By: Ilona Sorrel M.D.   On: 01/25/2019 17:33   [place] Ekg Site Rite  Result Date: 01/27/2019 If [facility]  image not attached, placement could not be confirmed due to current cardiac rhythm.    Scheduled Meds: . budesonide  9 mg Oral Daily  . calcium carbonate  500 mg of elemental calcium Oral BID   And  . cholecalciferol  200 Units Oral BID  . dronabinol  2.5 mg Oral BID AC  . escitalopram  10 mg Oral Daily  . furosemide  60 mg Intravenous Q8H  . insulin aspart  0-15 Units Subcutaneous Q6H  . levothyroxine  88 mcg Oral Q0600  . magnesium oxide  400 mg Oral BID  . multivitamin with minerals  1 tablet Oral Daily  . ondansetron  4 mg Intravenous Q8H  . pantoprazole  40 mg Oral BID  . potassium chloride  40 mEq Oral BID  . sodium bicarbonate  650 mg Oral BID   Continuous Infusions: . albumin human    . potassium chloride 10 mEq (01/27/19 1457)  . TPN ADULT (ION)       LOS: 20  days    Time spent: 25 minutes spent in the coordination of care today.   Jonnie Finner, DO [facility] Pager [phone]  If 7PM-7AM, please contact night-coverage [email or web address] Password [email or web address] 01/27/2019, 3:51 PM

## 2019-01-27 NOTE — Progress Notes (Signed)
Patient ID: Kaylee Rasmussen, female   DOB: [birth year], 68 y.o.   MRN: [id number]    Progress Note   Subjective    Day # 21 Husband at bedside.  Patient was seen with Dr. Rhea Belton and hospitalist.  Patient had feeding tube replaced yesterday afternoon, and pulled it out herself shortly thereafter if and she says she cannot tolerate a nasal tube. She has been trying to eat, and has done a bit better over the past 24 hours. Patient states her nausea has pretty much resolved, she still does not have an appetite.  She denies any abdominal pain today.  Continues to have diarrhea with 3-4 bowel movements per day.  Denies depression.  Hemoglobin 7, has drifted Creatinine 2.6-improved Potassium 3.4   Objective   Vital signs in last 24 hours: Temp:  [98.4 F (36.9 C)-99 F (37.2 C)] 98.4 F (36.9 C) (08/06 0423) Pulse Rate:  [82-94] 82 (08/06 0423) Resp:  [20-22] 20 (08/06 0423) BP: (141-151)/(76-83) 149/79 (08/06 0423) SpO2:  [95 %-100 %] 97 % (08/06 0423) Weight:  [73 kg] 73 kg (08/06 0423) Last BM Date: 01/25/19 General: Older white female in NAD, somewhat chronically ill-appearing Heart:  Regular rate and rhythm; no murmurs Lungs: Respirations even and unlabored, lungs CTA bilaterally Abdomen:  Soft, nontender, non-tense ascites with decreasing abdominal girth, she still has some pitting edema in the lower abdominal wall and into the flanks decreased, bowel sounds present Extremities: Significant decrease in peripheral edema Neurologic:  Alert and oriented,  grossly normal neurologically. Psych:  Cooperative. Normal mood and affect.  Intake/Output from previous day: 08/05 0701 - 08/06 0700 In: 371.8 [P.O.:20; IV Piggyback:351.8] Out: 1700 [Urine:1700] Intake/Output this shift: No intake/output data recorded.  Lab Results: Recent Labs    01/25/19 0507 01/26/19 0446 01/27/19 0441  WBC 7.7 8.0 7.2  HGB 7.7* 7.0* 7.0*  HCT 24.7* 21.9* 22.8*  PLT 151 141* 136*   BMET Recent  Labs    01/25/19 0507 01/26/19 0446 01/27/19 0441  NA 144 144 144  K 3.5 2.9* 3.4*  CL 111 108 109  CO2 24 26 26   GLUCOSE 104* 81 95  BUN 58* 56* 52*  CREATININE 2.77* 2.74* 2.64*  CALCIUM 8.5* 8.8* 8.7*   LFT Recent Labs    01/26/19 0446  PROT 5.7*  ALBUMIN 4.0  AST 37  ALT 13  ALKPHOS 119  BILITOT 3.1*   PT/INR No results for input(s): LABPROT, INR in the last 72 hours.  Studies/Results: US Abdominal Pelvic Art/vent Flow Doppler  Result Date: 01/25/2019 CLINICAL DATA:  Inpatient.  Cirrhosis. EXAM: DUPLEX ULTRASOUND OF LIVER AND TIPS SHUNT TECHNIQUE: Color and duplex Doppler ultrasound was performed to evaluate the hepatic in-flow and out-flow vessels. COMPARISON:  01/21/2019 liver Doppler. FINDINGS: Portal Vein Velocities Main: Minimal to-and-fro flow identified within the main portal vein, approximately 10 cm/sec. No discrete main portal vein thrombosis identified on the grayscale images. Right:  9 cm/sec Left:  12 cm/sec Hepatopetal flow in the right and left portal veins. IVC: Patent with normal respiratory phasicity. Hepatic Vein Velocities Right:  70 cm/sec Mid:  43 cm/sec Left:  40 cm/sec Patent hepatic veins with appropriate flow directions. Splenic Vein: Minimal to and fro flow identified within the splenic vein approximately 10 cm/sec. Hepatic Artery: 115 Ascities: Moderate volume ascites, most prominent in the right lower quadrant. Varices: Patent paraumbilical varix identified with hepatofugal flow. Other findings: Liver surface is diffusely irregular and liver parenchyma is diffusely coarsened and echogenic, compatible with  cirrhosis with possible superimposed steatosis. No liver mass demonstrated. IMPRESSION: 1. Hepatic cirrhosis. Moderate volume ascites, most prominent in the right lower quadrant. 2. Minimal to and fro flow within the main portal and splenic veins. No discrete thrombosis appreciated. 3. Slow hepatopetal flow within the right and left portal veins. 4.  Patent paraumbilical varix with hepatofugal flow. Electronically Signed   By: Delbert Phenix M.D.   On: 01/25/2019 17:33       Assessment / Plan:    #67 68 year old white female admitted 3 weeks ago with progressive weakness and failure to thrive in the setting of several month history of diarrhea, lack of appetite and weight loss of at least 25 pounds. This is in the setting of long history of rheumatoid arthritis, chronic kidney disease and prior history of necrotizing glomerulonephritis, hypertension, COPD. 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  It previously been on Humira but apparently this was stopped prior to his acute illnesses.  She never really improved after treatment for the CMV colitis  Colonoscopy here-biopsy showed mild nonspecific active colitis cannot rule out IBD Improving on Entocort 9 mg daily though still having 3 diarrheal stools per day  #2 severe protein calorie malnutrition-she has done a little bit better eating over the past 2 days, but unable to sustain herself. Nasal feeding tube was replaced yesterday and she immediately pulled it out.  Discussion this morning with the patient and her husband along with the hospitalist regarding TPN, and potential complications.  She is a poor candidate for PEG tube with underlying cirrhosis and ascites.  They are agreeable to TPN which will be started today We also discussed role of appetite stimulants, not a good candidate for Megace with cirrhosis, she is already on an SSRI.  To start Marinol Another consideration could be low-dose prednisone, will see how she does on Marinol  #3 acute kidney injury on chronic underlying kidney disease, urine sodium less th X this week an 10.  Nephrology has been following and treating with IV albumin and Lasix this week She has had a significant decrease in anasarca Creatinine slightly improved 2.6 Prognosis poor per Dr.  Arlean Hopping Continuing Lasix and albumin  #4 new diagnosis of cirrhosis, decompensated-etiology not clear all markers negative.  May be secondary to fatty liver disease or drug-induced with long history of methotrexate use for RA  #5 acute on chronic anemia-transfuse for hemoglobin less than 7     Principal Problem:   ARF (acute renal failure) (HCC) Active Problems:   Rheumatoid arthritis involving multiple sites with positive rheumatoid factor (HCC)   History of hypertension   History of hypothyroidism   Abnormal liver function   Diarrhea   Pulmonary nodule   Metabolic acidosis   Severe protein-calorie malnutrition (HCC)   Rectal bleeding   LFTs abnormal   Hepatic cirrhosis (HCC)   Pancreatic insufficiency   Ascites   AKI (acute kidney injury) (HCC)     LOS: 20 days   Uthman Mroczkowski EsterwoodPA-C  01/27/2019, 12:37 PM

## 2019-01-27 NOTE — Progress Notes (Signed)
[facility] Progress Note  Subjective: pt is sleepy again and withdrawn. UOP ^'d 1700 yest, -1.3 L net. BUN /Cr unchanged at 52/ 2.64  Vitals:   01/26/19 1004 01/26/19 1252 01/26/19 2007 01/27/19 0423  BP: (!) 144/75 (!) 141/76 (!) 151/83 (!) 149/79  Pulse: 90 84 94 82  Resp: (!) 28 (!) '22 20 20  '$ Temp: 98.1 F (36.7 C) 99 F (37.2 C) 98.8 F (37.1 C) 98.4 F (36.9 C)  TempSrc: Oral Oral Oral Oral  SpO2: 96% 100% 95% 97%  Weight:    73 kg  Height:        Inpatient medications: . budesonide  9 mg Oral Daily  . calcium carbonate  500 mg of elemental calcium Oral BID   And  . cholecalciferol  200 Units Oral BID  . dronabinol  2.5 mg Oral BID AC  . escitalopram  10 mg Oral Daily  . feeding supplement (JEVITY 1.5 CAL/FIBER)  1,000 mL Per Tube Q24H  . feeding supplement (PRO-STAT SUGAR FREE 64)  30 mL Per Tube BID  . free water  50 mL Per Tube Q6H  . furosemide  60 mg Intravenous Q8H  . insulin aspart  0-9 Units Subcutaneous Q4H  . levothyroxine  88 mcg Oral Q0600  . magnesium oxide  400 mg Oral BID  . multivitamin with minerals  1 tablet Oral Daily  . ondansetron  4 mg Intravenous Q8H  . pantoprazole  40 mg Oral BID  . potassium chloride  40 mEq Oral BID  . sodium bicarbonate  650 mg Oral BID   . potassium chloride 10 mEq (01/27/19 1140)  . TPN ADULT (ION)     acetaminophen, albuterol, diclofenac sodium, loperamide, morphine injection, ondansetron, oxyCODONE, phenol, sodium chloride flush, sodium chloride flush    Exam: Gen alert, chron ill appearing No rash, cyanosis or gangrene Sclera anicteric, throat clear  No jvd or bruits Chest clear bilat except some dec'd BS L base RRR no MRG Abd soft ntnd 2-3+ ascites diffusely MS no joint effusions or deformity Ext 2-3+ diffuse ext edema, no wounds or ulcer Neuro is alert, Ox 3 , nf , no asterixis    Home meds:  - amlodipine 2.5/ metoprolol xl 100 qd  - escitalopram 10 qd  - sod bicarb bid/ Kdur 10  qd/ proair hfa prn/ levothyroxine 88 qd  - prn's/ vitamins/ supplements   CT abd /pelvis 01/06/19 > 1. Abnormal appearance of the liver with heterogeneous hepatic steatosis and heterogeneous liver enhancement. Questionable fine liver surface irregularity. Cannot exclude hepatic cirrhosis. Nodiscrete liver masses. Recommend correlation with liver enzymes. 2. Third-spacing of fluid with moderate ascites, small dependent bilateral pleural effusions, right greater than left, and mild anasarca. 3. No evidence of bowel obstruction or acute bowel inflammation. Mild colonic diverticulosis, without convincing findings of acute diverticulitis. 4. Patchy bandlike consolidation in the left lung base, cannot exclude pneumonia or aspiration.    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  Not hypotensive. Alb very low. Getting IV albumin/ lasix and is diuresing some. Sig vol overload persists. Renal prognosis is not good with underlying sig CKD stage 3/4. Renal fxn is not good but remains stable.   CKD 3b - baseline creat 1.5, eGFR 32, due to her known  hx of ANCA+ GN treated 2019  Ascites/ cirrhosis w/ portal HTN - GI is following  Vol overload/ anasarca/ hypoalbuminemia-  as above, avoid any IVF's except D5W or D10 if needed for low BS  Malnutrition - this and liver dz contributing to low alb, anorexia x mos and has pulled out multiple NG tubes  Prognosis: poor   [facility] 01/27/2019, 12:12 PM  Iron/TIBC/Ferritin/ %Sat    Component Value Date/Time   IRON 81 01/07/2019 0928   TIBC NOT CALCULATED 01/07/2019 0928   FERRITIN 498 (H) 01/07/2019 0928   IRONPCTSAT NOT CALCULATED 01/07/2019 0928   Recent Labs  Lab 01/21/19 0517  01/25/19 1725 01/26/19 0446 01/27/19 0441  NA 138   < >  --  144 144  K  4.5   < >  --  2.9* 3.4*  CL 106   < >  --  108 109  CO2 25   < >  --  26 26  GLUCOSE 84   < >  --  81 95  BUN 46*   < >  --  56* 52*  CREATININE 2.73*   < >  --  2.74* 2.64*  CALCIUM 8.1*   < >  --  8.8* 8.7*  PHOS  --    < > 3.5  --   --   ALBUMIN 1.7*   < >  --  4.0  --   INR 1.2  --   --   --   --    < > = values in this interval not displayed.   Recent Labs  Lab 01/26/19 0446  AST 37  ALT 13  ALKPHOS 119  BILITOT 3.1*  PROT 5.7*   Recent Labs  Lab 01/27/19 0441  WBC 7.2  HGB 7.0*  HCT 22.8*  PLT 136*

## 2019-01-27 NOTE — Progress Notes (Signed)
Spoke with Dr Jonnie Finner re PICC exchange from 87fr to 27fr PICC.  States ok to place larger PICC line.

## 2019-01-27 NOTE — Progress Notes (Signed)
Nutrition Follow-up   DOCUMENTATION CODES:   Not applicable  INTERVENTION:  - once NGT replaced, initiate Jevity 1.5 @ 30 ml/hr and advance by 10 ml every 12 hours to reach goal rate of 50 ml/hr with 30 ml prostat BID. - will order 50 ml free water via NGT QID (200 ml/day).   NUTRITION DIAGNOSIS:   Inadequate oral intake related to acute illness, decreased appetite as evidenced by per patient/family report, meal completion < 50%. -ongoing  GOAL:   Patient will meet greater than or equal to 90% of their needs -unmet  MONITOR:   PO intake, Supplement acceptance, Labs, Weight trends  ASSESSMENT:   68 y.o. female with medical history of HTN, hyperlipidemia, asthma, rheumatoid arthritis, CMV colitis, HSV esophagitis in 07/2018, h/o necrotizing glomerulonephritis/vasculitis(+ ANCA), and pancytopenia secondary to cyclophosphamide. She presented to the ED with complaints of chronic diarrhea and intermittent N/V. She noted some blood in the stool and alerted outpatient GI provider who encouraged her to present to the ED. She had an EGD/colonoscopy in 07/2018 which showed an ulcerated mass-like lesion in the ileocecal valve with satellite ulcers, no malignancy noted, did show esophagitis and gastritis. She was admitted for c/o rectal bleeding, chronic diarrhea, non-AG acidosis secondary to diarrhea, and abnormal liver function.  Weight has slightly fluctuated up and down over the past 3 weeks. Diet advanced from NPO to 2 gram Na on 8/1 with most recent intakes recorded being 0% of lunch and dinner on 8/3; 0% of breakfast and lunch and 50% of dinner (325 kcal, 10 grams protein) on 8/4; 0% of all meals on 8/5.  Patient previously had a small bore NGT which was placed at bedside on 8/2 afternoon. Order for Jevity 1.5 @ 20 ml/hr to start the afternoon of 8/3 and to advance by 10 ml/hr every 12 hours to reach goal rate of Jevity 1.5 @ 50 ml/hr with 30 ml prostat BID. At goal rate, this regimen provides  2000 kcal, 106 grams protein, and 912 ml free water.   NGT was removed 8/3 at 7:39 PM. MD aware of NGT out and plan is for replacement.     Labs reviewed; CBGs: 95 and 87 mg/dl today, K: 3.4 mmol/l, BUN: 52 mg/dl, creatinine: 2.64 mg/dl, Ca: 8.7 mg/dl, GFR: 21 ml/min.   Medications reviewed; 50 g albumin BID from 8/3-8/5, 200 units vitamin D3 BID, 60 mg IV lasix TID, sliding scale novolog, 88 mcg oral synthroid/day, 400 mg mag-ox BID, daily multivitamin with minerals, 40 mg oral protonix BID, 10 mEq IV KCl x4 runs 8/5, 40 mEq K-Dur x1 dose 8/5, 650 mg oral sodium bicarb BID.     Diet Order:   Diet Order            Diet 2 gram sodium Room service appropriate? Yes; Fluid consistency: Thin  Diet effective now              EDUCATION NEEDS:   Not appropriate for education at this time  Skin:  Skin Assessment: Reviewed RN Assessment  Last BM:  8/4  Height:   Ht Readings from Last 1 Encounters:  01/09/19 5\' 1"  (1.549 m)    Weight:   Wt Readings from Last 1 Encounters:  01/27/19 73 kg    Ideal Body Weight:  47.7 kg  BMI:  Body mass index is 30.41 kg/m.  Estimated Nutritional Needs:   Kcal:  2000-2200 kcal  Protein:  100-115 grams  Fluid:  >/= 2.5 L/day     Janett Billow  Ceasar Mons, MS, RD, LDN, CNSC Inpatient Clinical Dietitian Pager # [phone] After hours/weekend pager # [phone]

## 2019-01-27 NOTE — Progress Notes (Addendum)
NUTRITION NOTE   Able to talk with Pharmacist a short time ago. Patient is refusing NGT replacement and consult was received for initiation of TPN. Patient has a single lumen PICC which was placed on 7/31. Patient at risk for refeeding.  RD will continue to follow.     Jarome Matin, MS, RD, LDN, [facility] Inpatient Clinical Dietitian Pager # [phone] After hours/weekend pager # [phone]

## 2019-01-27 NOTE — Progress Notes (Signed)
PT Cancellation Note  Patient Details Name: Kaylee Rasmussen MRN: [id number] DOB: [birth year]   Cancelled Treatment:    Reason Eval/Treat Not Completed: Patient declined, no reason specified;Fatigue/lethargy limiting ability to participate Patient stating she is too fatigued to participate in therapy this date and that she is not willing to participate today. She state she had her PICC placed today and was told to rest after the procedure was done. Time spent educating patient on benefits of mobilization but patient continued to decline. Will follow up at later date/time.  Kipp Brood, PT, DPT, [facility] Physical Therapist with [facility]  01/27/2019 5:02 PM

## 2019-01-27 NOTE — Progress Notes (Signed)
PT Cancellation Note  Patient Details Name: Kaylee Rasmussen MRN: [id number] DOB: 03-28-1951   Cancelled Treatment:    Reason Eval/Treat Not Completed: Other (comment)(RN in room with pt when PT arrived, RN stating IV team is on the way to insert a PICC for patient and that we should hold off on PT until after this is done.) Will follow up with patient at later date/time.   Kipp Brood, PT, DPT, [facility] Physical Therapist with [facility]  01/27/2019 2:05 PM

## 2019-01-27 NOTE — Progress Notes (Signed)
[place] NOTE   Pharmacy Consult for TPN Indication: intolerance to enteral feeding (refusal to place NGT)   Patient Measurements: Height: 5\' 1"  (154.9 cm) Weight: 160 lb 15 oz (73 kg) IBW/kg (Calculated) : 47.8 TPN AdjBW (KG): 53.8 Body mass index is 30.41 kg/m. Usual Weight: ~70 kg  HPI: 44 yoF with PMH HTN, HLD, COPD, asthma, RA previously on immune modulators, h/o necrotizing glomerulonephritis/vasculitis (+ ANCA), and pancytopenia secondary to cyclophosphamide. She presented to the ED with complaints of chronic diarrhea, intermittent n/v, and some blood noted in stool. During 07/2018 admission in [place], EGD/colonoscopy showed an ulcerated mass-like lesion in the ileocecal valve with satellite ulcers. 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 New diagnosis of cirrhosis this admission with portal HTN and severe ascites.  EGD here still shows duodenal ulcer and distal esophagitis; very poor PO intake. Core tract feeding tube placed 8/3 but subsequently dislodged. Patient did not tolerate replacement of NGT and now refusing any further attempt at replacement. Pharmacy consulted to dose TPN.  Central access: PICC placed 7/31 TPN start date: 8/6  Significant events:   ASSESSMENT                                                                                                           Today, 01/27/2019:  Glucose (CBG goal 100-150) - CBGs well controlled prior to starting TPN (minimal PO intake)  Electrolytes - K borderline low, others WNL  Renal - acute on CKD, suspect hepatorenal vs cardiorenal, BUN elevated, bicarb WNL  I/O - remains volume overloaded, diuresing with Lasix  LFTs - Mildly elevated on admission but improved to WNL, Tbili remains elevated; albumin WNL but only d/t multiple doses of albumin for ascites  TGs - pending  Prealbumin - pending  Current Nutrition - salt-restricted diet,  minimal intake  IVF - none  NUTRITIONAL GOALS                                                                                             RD recs (8/6): pending  PLAN  KCl 10 mEq IV x4 per MD  At 1800 today:  Initiate TPN at 40 ml/hr  TPN at 40 ml/hr provides 48 g of protein and 969 kcals, f/u RD recs to assess total needs  Hold MVI and trace elements while still taking PO multivitamin  Electrolytes: standard conc; Cl:Ac 1:1  Any IVF per MD  Initiate SSI with q6 hr CBG checks  TPN lab panels on Mondays & Thursdays  Full panel tomorrow AM  Reuel Boom, PharmD, BCPS [phone] 01/27/2019, 11:54 AM

## 2019-01-27 NOTE — Progress Notes (Signed)
Peripherally Inserted Central Catheter/Midline Placement  The IV Nurse has discussed with the patient and/or persons authorized to consent for the patient, the purpose of this procedure and the potential benefits and risks involved with this procedure.  The benefits include less needle sticks, lab draws from the catheter, and the patient may be discharged home with the catheter. Risks include, but not limited to, infection, bleeding, blood clot (thrombus formation), and puncture of an artery; nerve damage and irregular heartbeat and possibility to perform a PICC exchange if needed/ordered by physician.  Alternatives to this procedure were also discussed.  Bard Power PICC patient education guide, fact sheet on infection prevention and patient information card has been provided to patient /or left at bedside.  Husband at bedside agreeable.  Previous consent on chart.  PICC/Midline Placement Documentation  PICC Double Lumen 01/27/19 PICC Right Brachial 34 cm 0 cm (Active)  Indication for Insertion or Continuance of Line Administration of hyperosmolar/irritating solutions (i.e. TPN, Vancomycin, etc.) 01/27/19 1400  Exposed Catheter (cm) 0 cm 01/27/19 1400  Site Assessment Clean;Dry;Intact 01/27/19 1400  Lumen #1 Status Flushed;Saline locked;Blood return noted 01/27/19 1400  Lumen #2 Status Flushed;Saline locked;Blood return noted 01/27/19 1400  Dressing Type Transparent 01/27/19 1400  Dressing Status Clean;Dry;Intact;Antimicrobial disc in place 01/27/19 1400  [place] checked and tightened 01/27/19 1400  Line Adjustment (NICU/IV Team Only) No 01/27/19 1400  Dressing Intervention New dressing 01/27/19 1400  Dressing Change Due 02/03/19 01/27/19 1400       Kaylee Rasmussen 01/27/2019, 2:56 PM

## 2019-01-28 LAB — COMPREHENSIVE METABOLIC PANEL
ALT: 11 U/L (ref 0–44)
AST: 32 U/L (ref 15–41)
Albumin: 4.5 g/dL (ref 3.5–5.0)
Alkaline Phosphatase: 88 U/L (ref 38–126)
Anion gap: 9 (ref 5–15)
BUN: 50 mg/dL — ABNORMAL HIGH (ref 8–23)
CO2: 27 mmol/L (ref 22–32)
Calcium: 9 mg/dL (ref 8.9–10.3)
Chloride: 109 mmol/L (ref 98–111)
Creatinine, Ser: 2.54 mg/dL — ABNORMAL HIGH (ref 0.44–1.00)
GFR calc Af Amer: 22 mL/min — ABNORMAL LOW (ref >60)
GFR calc non Af Amer: 19 mL/min — ABNORMAL LOW (ref >60)
Glucose, Bld: 128 mg/dL — ABNORMAL HIGH (ref 70–99)
Potassium: 3.8 mmol/L (ref 3.5–5.1)
Sodium: 145 mmol/L (ref 135–145)
Total Bilirubin: 2.4 mg/dL — ABNORMAL HIGH (ref 0.3–1.2)
Total Protein: 6 g/dL — ABNORMAL LOW (ref 6.5–8.1)

## 2019-01-28 LAB — PREPARE RBC (CROSSMATCH)

## 2019-01-28 LAB — CBC
HCT: 21.5 % — ABNORMAL LOW (ref 36.0–46.0)
Hemoglobin: 6.7 g/dL — CL (ref 12.0–15.0)
MCH: 32.1 pg (ref 26.0–34.0)
MCHC: 31.2 g/dL (ref 30.0–36.0)
MCV: 102.9 fL — ABNORMAL HIGH (ref 80.0–100.0)
Platelets: 138 10*3/uL — ABNORMAL LOW (ref 150–400)
RBC: 2.09 MIL/uL — ABNORMAL LOW (ref 3.87–5.11)
RDW: 22.6 % — ABNORMAL HIGH (ref 11.5–15.5)
WBC: 7.4 10*3/uL (ref 4.0–10.5)
nRBC: 0 % (ref 0.0–0.2)

## 2019-01-28 LAB — DIFFERENTIAL
Abs Immature Granulocytes: 0.27 10*3/uL — ABNORMAL HIGH (ref 0.00–0.07)
Basophils Absolute: 0 10*3/uL (ref 0.0–0.1)
Basophils Relative: 0 %
Eosinophils Absolute: 0 10*3/uL (ref 0.0–0.5)
Eosinophils Relative: 0 %
Immature Granulocytes: 4 %
Lymphocytes Relative: 11 %
Lymphs Abs: 0.8 10*3/uL (ref 0.7–4.0)
Monocytes Absolute: 0.8 10*3/uL (ref 0.1–1.0)
Monocytes Relative: 11 %
Neutro Abs: 5.4 10*3/uL (ref 1.7–7.7)
Neutrophils Relative %: 74 %

## 2019-01-28 LAB — MAGNESIUM: Magnesium: 1.9 mg/dL (ref 1.7–2.4)

## 2019-01-28 LAB — GLUCOSE, CAPILLARY
Glucose-Capillary: 131 mg/dL — ABNORMAL HIGH (ref 70–99)
Glucose-Capillary: 132 mg/dL — ABNORMAL HIGH (ref 70–99)
Glucose-Capillary: 141 mg/dL — ABNORMAL HIGH (ref 70–99)
Glucose-Capillary: 147 mg/dL — ABNORMAL HIGH (ref 70–99)

## 2019-01-28 LAB — BODY FLUID CULTURE: Culture: NO GROWTH

## 2019-01-28 LAB — PREALBUMIN: Prealbumin: 8.4 mg/dL — ABNORMAL LOW (ref 18–38)

## 2019-01-28 LAB — PHOSPHORUS: Phosphorus: 2.4 mg/dL — ABNORMAL LOW (ref 2.5–4.6)

## 2019-01-28 LAB — TRIGLYCERIDES: Triglycerides: 112 mg/dL (ref <150)

## 2019-01-28 MED ORDER — FUROSEMIDE 10 MG/ML IJ SOLN
80.0000 mg | Freq: Three times a day (TID) | INTRAMUSCULAR | Status: DC
  Administered 2019-01-28 – 2019-01-29 (×3): 80 mg via INTRAVENOUS
  Filled 2019-01-28 (×3): qty 8

## 2019-01-28 MED ORDER — ACETAMINOPHEN 325 MG PO TABS
650.0000 mg | ORAL_TABLET | Freq: Once | ORAL | Status: AC
  Administered 2019-01-28: 650 mg via ORAL
  Filled 2019-01-28: qty 2

## 2019-01-28 MED ORDER — TRACE MINERALS CR-CU-MN-SE-ZN 10-1000-500-60 MCG/ML IV SOLN
INTRAVENOUS | Status: AC
  Administered 2019-01-28: 17:00:00 via INTRAVENOUS
  Filled 2019-01-28: qty 616

## 2019-01-28 MED ORDER — SODIUM CHLORIDE 0.9% IV SOLUTION: Freq: Once | INTRAVENOUS | Status: DC

## 2019-01-28 MED ORDER — DIPHENHYDRAMINE HCL 25 MG PO CAPS
25.0000 mg | ORAL_CAPSULE | Freq: Once | ORAL | Status: AC
  Administered 2019-01-28: 25 mg via ORAL
  Filled 2019-01-28: qty 1

## 2019-01-28 NOTE — Progress Notes (Signed)
PT Cancellation Note  Patient Details Name: Kaylee Rasmussen MRN: [id number] DOB: [birth year]   Cancelled Treatment:    Reason Eval/Treat Not Completed: Patient at procedure or test/unavailable(patient is currently getting albumin and is waiting on transfusion as she has low hgb (6.7) this AM. Spoke with RN, Pam, will hold and check back after transfusion today.)   Kipp Brood, PT, DPT, [facility] Physical Therapist with [facility]  01/28/2019 12:44 PM

## 2019-01-28 NOTE — Progress Notes (Signed)
[facility] Progress Note  Subjective: on TNA now, I/O -900 cc yest. 2.4 L UOP. Pt feeling a little better today.   Vitals:   01/27/19 1520 01/27/19 1534 01/27/19 2135 01/28/19 0529  BP:  140/75 (!) 148/87 (!) 148/84  Pulse:  86 93 80  Resp:  '18 16 20  '$ Temp:  98.5 F (36.9 C) 99.2 F (37.3 C) 98.5 F (36.9 C)  TempSrc:  Oral Oral Oral  SpO2:  94% 96% 99%  Weight: 70.9 kg   72.1 kg  Height:        Inpatient medications: . sodium chloride   Intravenous Once  . budesonide  9 mg Oral Daily  . calcium carbonate  500 mg of elemental calcium Oral BID   And  . cholecalciferol  200 Units Oral BID  . dronabinol  2.5 mg Oral BID AC  . escitalopram  10 mg Oral Daily  . furosemide  60 mg Intravenous Q8H  . insulin aspart  0-15 Units Subcutaneous Q6H  . levothyroxine  88 mcg Oral Q0600  . magnesium oxide  400 mg Oral BID  . ondansetron  4 mg Intravenous Q8H  . pantoprazole  40 mg Oral BID  . potassium chloride  40 mEq Oral BID  . sodium bicarbonate  650 mg Oral BID   . albumin human 50 g (01/28/19 1114)  . TPN ADULT (ION) 40 mL/hr at 01/28/19 0600  . TPN ADULT (ION)     acetaminophen, albuterol, diclofenac sodium, loperamide, morphine injection, ondansetron, oxyCODONE, phenol, sodium chloride flush, sodium chloride flush    Exam: Gen alert, chron ill appearing No rash, cyanosis or gangrene Sclera anicteric, throat clear  No jvd or bruits Chest clear bilat except some dec'd BS L base RRR no MRG Abd soft ntnd 2-3+ ascites diffusely MS no joint effusions or deformity Ext 2-3+ diffuse ext edema, no wounds or ulcer Neuro is alert, Ox 3 , nf , no asterixis    Home meds:  - amlodipine 2.5/ metoprolol xl 100 qd  - escitalopram 10 qd  - sod bicarb bid/ Kdur 10 qd/ proair hfa prn/ levothyroxine 88 qd  - prn's/ vitamins/ supplements   CT abd /pelvis 01/06/19 > 1. Abnormal appearance of the liver with heterogeneous hepatic steatosis and heterogeneous liver  enhancement. Questionable fine liver surface irregularity. Cannot exclude hepatic cirrhosis. Nodiscrete liver masses. Recommend correlation with liver enzymes. 2. Third-spacing of fluid with moderate ascites, small dependent bilateral pleural effusions, right greater than left, and mild anasarca. 3. No evidence of bowel obstruction or acute bowel inflammation. Mild colonic diverticulosis, without convincing findings of acute diverticulitis. 4. Patchy bandlike consolidation in the left lung base, cannot exclude pneumonia or aspiration.    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 EF 60% by echo.  Not hypotensive. Alb very low. Will ^ lasix IV w/ TNA now. Pt is diuresing some. Sig vol overload persists.  Spoke w/ her renal MD Dr Myrtie Cruise, will cont diuretics for now, however if appetite/ vol issues not improving over the weekend we will consider trial of dialysis.   CKD 3b - baseline creat 1.5, eGFR 32, due to her known hx of ANCA+ GN treated 2019  Ascites/ cirrhosis w/ portal HTN - GI is following  Vol overload/ anasarca/  hypoalbuminemia-  as above, avoid any IVF's except D5W or D10 if needed for low BS  Malnutrition - this and liver dz contributing to low alb   [facility] 01/28/2019, 12:49 PM  Iron/TIBC/Ferritin/ %Sat    Component Value Date/Time   IRON 81 01/07/2019 0928   TIBC NOT CALCULATED 01/07/2019 0928   FERRITIN 498 (H) 01/07/2019 0928   IRONPCTSAT NOT CALCULATED 01/07/2019 0928   Recent Labs  Lab 01/28/19 0305  NA 145  K 3.8  CL 109  CO2 27  GLUCOSE 128*  BUN 50*  CREATININE 2.54*  CALCIUM 9.0  PHOS 2.4*  ALBUMIN 4.5   Recent Labs  Lab 01/28/19 0305  AST 32  ALT 11  ALKPHOS 88  BILITOT 2.4*  PROT 6.0*   Recent Labs  Lab 01/28/19 0305  WBC 7.4  HGB 6.7*   HCT 21.5*  PLT 138*

## 2019-01-28 NOTE — Progress Notes (Signed)
CRITICAL VALUE ALERT  Critical Value:  Hgb 6.7  Date & Time Notied:  01/28/2019 0425  Provider Notified: MD Maudie Mercury  Orders Received/Actions taken: awaiting orders

## 2019-01-28 NOTE — Progress Notes (Signed)
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  She was admitted for blood in stool and chronic diarrhea and abnormal liver function. She's now s/p colonoscopy with GI   Assessment & Plan:   Principal Problem:   ARF (acute renal failure) (HCC) Active Problems:   Rheumatoid arthritis involving multiple sites with positive rheumatoid factor (HCC)   History of hypertension   History of hypothyroidism   Abnormal liver function   Diarrhea   Pulmonary nodule   Metabolic acidosis   Severe protein-calorie malnutrition (HCC)   Rectal bleeding   LFTs abnormal   Cirrhosis (HCC)   Pancreatic insufficiency   Ascites   AKI (acute kidney injury) ([place])   Blood in stool, CMV colitis - GI was consulted, appreciate recommendations - Colonoscopy was performed with findings of localized inflammation at ileocecal valve, external and internal hemorrhoids; see report -CMV found to be negative - No report of further bleeding noted currently  Abnormal liver function with hepatorenal syndrome - GI following - Earlier MRCP demonstrated severe diffuse hepatic steatosis (can't exclude cirrhosis) - solitary subcentimeter R liver lesion, likely benign cyst  - Unclear etiology, although had been on methotrexate - Large ascites and worsening renal function concerning for hepatorenal syndrome, see below - Pt is s/p large volume paracentesis yielding 4.5L on 7/22 - Liver US performed: 1. Hepatic  cirrhosis. Moderate volume ascites, most prominent in the right lower quadrant. 2. Minimal to and fro flow within the main portal and splenic veins. No discrete thrombosis appreciated. 3. Slow hepatopetal flow within the right and left portal veins. 4. Patent paraumbilical varix with hepatofugal flow. - s/p diagnosis paracentesis 01/25/2019: awaiting results  Chronic diarrhea - gliadin antibodies - wnl, TTG - normal - Cholestyramine prn - Initially resolved with colestid BID, imodium, and creon - Colestid stopped 7/31 as it may contribute to nausea, also to get a better assessment of diarrhea - Entocort started 7/31  Community Acquired Pneumonia - Bld Cx Neg  - CXR with patchy airspace dz in L lung base (early pneumonia vs sequelae of aspiration) - Repeat CXR with small bilateral effusions and associated atelectasis vs consolidation - Completed course of Ceftriaxone/azithro x 5 days - Remains afebrile  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 - Concern for hepatorenal syndrome - Baseline Cr around 1.5 - SCr currently 2.74 - nephro onboard; IV albumin/lasix  Hypotension/Hx of Hypertension - Continue holding antihypertensives for now - BP remains stable  Non- AG acidosis - resolved  N/V - Zofran 4mg  iv q6h prn - Colestid recently stopped by GI as it may contribute to nausea - presently stable at this time - NGT to be placed today for nutrition  Hypothyroidism - Cont Levothyroxine 88 micrograms po qday as tolerated  Anxiety - Cont Lexapro 10mg  po qday as pt tolerates - Currently stable  Normocytic Anemia - stable, no frank bleed noted - Labs reviewed. Hgb stable   RA  - Currently off Humira, follows with Deveshwar  - continue with Voltaren gel as needed  Pulmonary Nodule - Recommended outpatient follow up  Hypoglycemia - stable, monitor, encourage  diet  Hypomagnesemia/Hypophospatemia - replete, monitor  Debility - PT recs SNF; Plan SNF when medically cleared   Toxic metabolic encephalopathy - resolved  Anasarca - BLE edema and signs of volume overload - 2d echo reviewed, normal LVEF - Given trial of lasix with some improvement. Lasix stopped given concerns of intravascular depletion and dehydration - Suspect anasarca related to marked malnutrition per above - GI recommendation for Cortrak, however pt pulled tube overnight. OK to leave out for now per GI - Cont lasix with albumin per Nephrology  Severe protein calorie malnutrition - anorexia for several months noted - Likely contributing to anasarca above - Encourage PO intake. NG tube lost overnight - OK to leave out for today per GI - NGT was replaced but she did not want it in     - GI to start TPN; PICC in place     - TPN started, Marinol started; monitor     DVT prophylaxis:SCDs Code Status:FULL Disposition Plan:To SNF when stable  Consultants:  GI  IR  Neprhology  Procedures:  Colonoscopy  EGD  ROS:  Reports appetite still poor, but trying to eat. Generally tired. Remainder 10-pt ROS is negative for all not previously mentioned.  Subjective: "I ate a little."  Objective: Vitals:   01/28/19 0529 01/28/19 1428 01/28/19 1504 01/28/19 1526  BP: (!) 148/84 (!) 142/74 137/73 135/74  Pulse: 80 85 86 84  Resp: 20 14 16 18   Temp: 98.5 F (36.9 C) 98.2 F (36.8 C) 98.7 F (37.1 C) 98.7 F (37.1 C)  TempSrc: Oral Oral Oral Oral  SpO2: 99% 96% 97% 97%  Weight: 72.1 kg     Height:        Intake/Output Summary (Last 24 hours) at 01/28/2019 1627 Last data filed at 01/28/2019 1531 Gross per 24 hour  Intake 1808.79 ml  Output 1250 ml  Net 558.79 ml   Filed Weights   01/27/19 0423 01/27/19 1520 01/28/19 0529  Weight: 73 kg 70.9 kg 72.1 kg    Examination:  General:68  y.o.femaleresting in bed in NAD; frail Eyes: PERRL, normal sclera ENMT: Nares patent w/o discharge, orophaynx clear, dentition normal, ears w/o discharge/lesions/ulcers Cardiovascular: RRR, +S1, S2, no m/g/r, equal pulses throughout Respiratory: CTABL, no w/r/r, normal WOB GI: BS+, NT, distended but soft, no masses noted, no organomegaly noted MSK:ble edema improving Skin: No rashes, bruises, ulcerations noted Neuro:alert to name, follows commands, no focal deficits Psyc: Appropriate interaction, flat affect, calm/cooperative   Data Reviewed: I have personally reviewed following labs and imaging studies.  CBC: Recent Labs  Lab 01/24/19 0350 01/25/19 0507 01/26/19 0446 01/27/19 0441 01/28/19 0305  WBC 8.1 7.7 8.0 7.2 7.4  NEUTROABS  --   --   --  5.2 5.4  HGB 8.7* 7.7* 7.0* 7.0* 6.7*  HCT 28.2* 24.7* 21.9* 22.8* 21.5*  MCV 100.0 100.0 100.9* 101.3* 102.9*  PLT 155 151 141* 136* 696*   Basic Metabolic Panel: Recent Labs  Lab 01/24/19 0350 01/24/19 1551 01/25/19 0507 01/25/19 1725 01/26/19 0446 01/27/19 0441 01/28/19 0305  NA 141  --  144  --  144 144 145  K 4.1  --  3.5  --  2.9* 3.4* 3.8  CL 110  --  111  --  108 109 109  CO2 24  --  24  --  26 26 27   GLUCOSE 95  --  104*  --  81 95 128*  BUN 59*  --  58*  --  56* 52* 50*  CREATININE  2.78*  --  2.77*  --  2.74* 2.64* 2.54*  CALCIUM 7.8*  --  8.5*  --  8.8* 8.7* 9.0  MG 2.0  --  2.0  --  1.9  --  1.9  PHOS  --  3.2 3.4 3.5  --   --  2.4*   GFR: Estimated Creatinine Clearance: 19.5 mL/min (A) (by C-G formula based on SCr of 2.54 mg/dL (H)). Liver Function Tests: Recent Labs  Lab 01/23/19 0455 01/24/19 0350 01/25/19 0507 01/26/19 0446 01/28/19 0305  AST 59* 65* 53* 37 32  ALT 15 17 15 13 11   ALKPHOS 187* 205* 169* 119 88  BILITOT 2.0* 1.9* 2.4* 3.1* 2.4*  PROT 4.6* 4.6* 5.3* 5.7* 6.0*  ALBUMIN 1.7* 1.6* 3.0* 4.0 4.5   No results for input(s): LIPASE, AMYLASE in the last 168 hours. No results for  input(s): AMMONIA in the last 168 hours. Coagulation Profile: No results for input(s): INR, PROTIME in the last 168 hours. Cardiac Enzymes: No results for input(s): CKTOTAL, CKMB, CKMBINDEX, TROPONINI in the last 168 hours. BNP (last 3 results) No results for input(s): PROBNP in the last 8760 hours. HbA1C: No results for input(s): HGBA1C in the last 72 hours. CBG: Recent Labs  Lab 01/27/19 1138 01/27/19 1915 01/28/19 0012 01/28/19 0526 01/28/19 1159  GLUCAP 90 149* 131* 132* 141*   Lipid Profile: Recent Labs    01/28/19 0305  TRIG 112   Thyroid Function Tests: No results for input(s): TSH, T4TOTAL, FREET4, T3FREE, THYROIDAB in the last 72 hours. Anemia Panel: No results for input(s): VITAMINB12, FOLATE, FERRITIN, TIBC, IRON, RETICCTPCT in the last 72 hours. Sepsis Labs: No results for input(s): PROCALCITON, LATICACIDVEN in the last 168 hours.  Recent Results (from the past 240 hour(s))  Body fluid culture     Status: None   Collection Time: 01/25/19 11:48 AM   Specimen: Peritoneal Washings; Body Fluid  Result Value Ref Range Status   Specimen Description   Final    PERITONEAL Performed at [facility], [place] [address], [place], [place] [address]    Special Requests   Final    NONE Performed at [facility], [place] [address] [place], [place] [address]    Gram Stain   Final    WBC PRESENT, PREDOMINANTLY MONONUCLEAR NO ORGANISMS SEEN CYTOSPIN SMEAR    Culture   Final    NO GROWTH 3 DAYS Performed at [facility], [place] [address], [place], [place] [address]    Report Status 01/28/2019 FINAL  Final      Radiology Studies: [place] [employer] Site Rite  Result Date: 01/27/2019 If Site Rite image not attached, placement could not be confirmed due to current cardiac rhythm.    Scheduled Meds: . sodium chloride   Intravenous Once  . budesonide  9 mg Oral Daily  . calcium carbonate  500 mg of elemental calcium Oral BID   And  . cholecalciferol   200 Units Oral BID  . dronabinol  2.5 mg Oral BID AC  . escitalopram  10 mg Oral Daily  . furosemide  80 mg Intravenous Q8H  . insulin aspart  0-15 Units Subcutaneous Q6H  . levothyroxine  88 mcg Oral Q0600  . magnesium oxide  400 mg Oral BID  . ondansetron  4 mg Intravenous Q8H  . pantoprazole  40 mg Oral BID  . potassium chloride  40 mEq Oral BID  . sodium bicarbonate  650 mg Oral BID   Continuous  Infusions: . albumin human 50 g (01/28/19 1114)  . TPN ADULT (ION) 40 mL/hr at 01/28/19 0600  . TPN ADULT (ION)       LOS: 21 days    Time spent: 25 minutes spent in the coordination of care today.   Jonnie Finner, DO [facility] Pager [phone]  If 7PM-7AM, please contact night-coverage [email or web address] Password [facility] 01/28/2019, 4:27 PM

## 2019-01-28 NOTE — Progress Notes (Signed)
Patient ID: Kaylee Rasmussen, female   DOB: April 30, 1951, 68 y.o.   MRN: [id number]    Progress Note   Subjective  Day #22  TPN started yesterday  Patient feels she is doing a little better, denies any nausea or abdominal discomfort, 2-3 loose stools yesterday which is significant improvement over admit. Actually says she is starting to have some appetite.  HGB 6.7 this a.m. Creatinine 2.5-improved   Objective   Vital signs in last 24 hours: Temp:  [98.5 F (36.9 C)-99.2 F (37.3 C)] 98.5 F (36.9 C) (08/07 0529) Pulse Rate:  [80-93] 80 (08/07 0529) Resp:  [16-20] 20 (08/07 0529) BP: (140-148)/(75-87) 148/84 (08/07 0529) SpO2:  [94 %-99 %] 99 % (08/07 0529) Weight:  [70.9 kg-72.1 kg] 72.1 kg (08/07 0529) Last BM Date: 01/25/19 General: Older white female in NAD, pleasant, smiled this morning Heart:  Regular rate and rhythm; no murmurs Lungs: Respirations even and unlabored, lungs CTA bilaterally Abdomen:  Soft, nontender and nondistended. Normal bowel sounds.  Less soft tissue edema the lower abdominal wall Extremities:  Without edema.-Much improved Neurologic:  Alert and oriented,  grossly normal neurologically. Psych:  Cooperative. Normal mood and affect.  Intake/Output from previous day: 08/06 0701 - 08/07 0700 In: 1591.3 [P.O.:300; I.V.:780.8; IV Piggyback:510.5] Out: 9390 [Urine:2450] Intake/Output this shift: No intake/output data recorded.  Lab Results: Recent Labs    01/26/19 0446 01/27/19 0441 01/28/19 0305  WBC 8.0 7.2 7.4  HGB 7.0* 7.0* 6.7*  HCT 21.9* 22.8* 21.5*  PLT 141* 136* 138*   BMET Recent Labs    01/26/19 0446 01/27/19 0441 01/28/19 0305  NA 144 144 145  K 2.9* 3.4* 3.8  CL 108 109 109  CO2 26 26 27   GLUCOSE 81 95 128*  BUN 56* 52* 50*  CREATININE 2.74* 2.64* 2.54*  CALCIUM 8.8* 8.7* 9.0   LFT Recent Labs    01/28/19 0305  PROT 6.0*  ALBUMIN 4.5  AST 32  ALT 11  ALKPHOS 88  BILITOT 2.4*   PT/INR No results for input(s):  LABPROT, INR in the last 72 hours.  Studies/Results: [place] [employer]  Result Date: 01/27/2019 If [employer] not attached, placement could not be confirmed due to current cardiac rhythm.      Assessment / Plan:    #66 68 year old white female, admitted 3 weeks ago with progressive weakness and failure to thrive in setting of several month history of diarrhea, lack of appetite and weight loss of 25 pounds.  This is in setting of long history of rheumatoid arthritis, chronic kidney disease and prior history of necrotizing glomerulonephritis, hypertension and COPD. 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  Mildly active nonspecific colitis on biopsies colonoscopy this admit, suspect underlying IBD On Entocort 9 mg p.o. daily, and has improved  #2 severe protein calorie malnutrition, multifactoral.  Patient has been unable to tolerate enteral tube feeds and pulled the tube immediately after placed a couple of days ago. She was started on TPN yesterday Continue efforts at oral feedings, Marinol started for appetite stimulation Could also consider low-dose prednisone  #3 acute kidney injury on chronic underlying kidney disease-nephrology has been following urine sodium less than 10 and treating as HRS.  She continues on IV Lasix 3 times daily and albumin Creatinine improving  #4 anasarca-significant improvement this week with Lasix and albumin #5 new diagnosis of cirrhosis, decompensated, etiology not clear.  All markers negative. Question secondary to fatty liver disease versus drug-induced with long history of methotrexate use for RA.  #6 acute on chronic anemia-hemoglobin drifting will need  transfusion today   Principal Problem:   ARF (acute renal failure) (HCC) Active Problems:   Rheumatoid arthritis involving multiple sites with positive rheumatoid factor (HCC)   History of hypertension   History of hypothyroidism   Abnormal liver function   Diarrhea   Pulmonary nodule   Metabolic acidosis   Severe protein-calorie malnutrition (HCC)   Rectal bleeding   LFTs abnormal   Hepatic cirrhosis (HCC)   Pancreatic insufficiency   Ascites   AKI (acute kidney injury) ([place])     LOS: 21 days   Amy EsterwoodPA-C  01/28/2019, 9:45 AM

## 2019-01-28 NOTE — Progress Notes (Addendum)
PHARMACY - ADULT TOTAL PARENTERAL NUTRITION CONSULT NOTE   Pharmacy Consult for TPN Indication: intolerance to enteral feeding (refusal to place NGT)   Patient Measurements: Height: 5\' 1"  (154.9 cm) Weight: 158 lb 15.2 oz (72.1 kg) IBW/kg (Calculated) : 47.8 TPN AdjBW (KG): 53.8 Body mass index is 30.03 kg/m. Usual Weight: ~70 kg  HPI: 5 yoF with PMH HTN, HLD, COPD, asthma, RA previously on immune modulators, h/o necrotizing glomerulonephritis/vasculitis (+ ANCA), and pancytopenia secondary to cyclophosphamide. She presented to the ED with complaints of chronic diarrhea, intermittent n/v, and some blood noted in stool. During 07/2018 admission in [place], EGD/colonoscopy showed an ulcerated mass-like lesion in the ileocecal valve with satellite ulcers. 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 New diagnosis of cirrhosis this admission with portal HTN and severe ascites.  EGD here still shows duodenal ulcer and distal esophagitis; very poor PO intake. Core tract feeding tube placed 8/3 but subsequently dislodged. Patient did not tolerate replacement of NGT and now refusing any further attempt at replacement. Pharmacy consulted to dose TPN. Per RD, considered at-risk for refeeding  Central access: PICC placed 7/31 TPN start date: 8/6  Significant events:   ASSESSMENT                                                                                                           Today, 01/28/2019:  Glucose (CBG goal 100-150) - CBGs increased but still controlled after initiating TPN (132-149)  6 units moderate SSI given since starting TPN (~12 hrs)  Systemic absorption of Entocort is fairly low (< 20%)  Electrolytes - WNL except Phos borderline low  Renal - acute on CKD, concern for hepatorenal vs cardiorenal; BUN elevated but declining; bicarb remains WNL; robust UOP with diuresis  I/O - still with significant ascites, diuresing with Lasix and  albumin  LFTs - improved to WNL, Tbili remains elevated; albumin WNL but only d/t multiple doses of albumin for ascites  TGs - WNL  Prealbumin - low  Current Nutrition - salt-restricted diet, minimal intake  IVF - none  NUTRITIONAL GOALS                                                                                             RD recs (8/6): Kcal:  2000-2200 kcal Protein:  100-115 grams Fluid:  >/= 2.5 L/day  PLAN  At 1800 today:  Advance TPN to 70 ml/hr  TPN at goal rate of 85 ml/hr provides 112 g of protein and 2101 kcals, meeting 100% of patient needs  Will utilize concentrated (15%) amino acids d/t volume overload and ascites  Add MVI daily and trace elements MWF (due to national shortage)  Electrolytes: increase Phos and K, others at standard conc; Cl:Ac 1:1  IVF (none currently) per MD  Continue moderate SSI with q6 hr CBG checks  TPN lab panels on Mondays & Thursdays  Bmet, Mg, Phos  Reuel Boom, PharmD, BCPS [phone] 01/28/2019, 7:10 AM

## 2019-01-29 LAB — TYPE AND SCREEN
ABO/RH(D): B POS
Antibody Screen: NEGATIVE
Unit division: 0
Unit division: 0

## 2019-01-29 LAB — GLUCOSE, CAPILLARY
Glucose-Capillary: 142 mg/dL — ABNORMAL HIGH (ref 70–99)
Glucose-Capillary: 115 mg/dL — ABNORMAL HIGH (ref 70–99)
Glucose-Capillary: 128 mg/dL — ABNORMAL HIGH (ref 70–99)
Glucose-Capillary: 151 mg/dL — ABNORMAL HIGH (ref 70–99)

## 2019-01-29 LAB — BASIC METABOLIC PANEL
Anion gap: 8 (ref 5–15)
BUN: 50 mg/dL — ABNORMAL HIGH (ref 8–23)
CO2: 28 mmol/L (ref 22–32)
Calcium: 9 mg/dL (ref 8.9–10.3)
Chloride: 108 mmol/L (ref 98–111)
Creatinine, Ser: 2.3 mg/dL — ABNORMAL HIGH (ref 0.44–1.00)
GFR calc Af Amer: 25 mL/min — ABNORMAL LOW (ref >60)
GFR calc non Af Amer: 21 mL/min — ABNORMAL LOW (ref >60)
Glucose, Bld: 121 mg/dL — ABNORMAL HIGH (ref 70–99)
Potassium: 4.1 mmol/L (ref 3.5–5.1)
Sodium: 144 mmol/L (ref 135–145)

## 2019-01-29 LAB — BPAM RBC
Blood Product Expiration Date: 202008262359
Blood Product Expiration Date: 202008302359
ISSUE DATE / TIME: 202008071451
ISSUE DATE / TIME: 202008071845
Unit Type and Rh: 7300
Unit Type and Rh: 7300

## 2019-01-29 LAB — HEMOGLOBIN AND HEMATOCRIT, BLOOD
HCT: 33.2 % — ABNORMAL LOW (ref 36.0–46.0)
Hemoglobin: 10.7 g/dL — ABNORMAL LOW (ref 12.0–15.0)

## 2019-01-29 LAB — MAGNESIUM: Magnesium: 1.8 mg/dL (ref 1.7–2.4)

## 2019-01-29 LAB — PHOSPHORUS: Phosphorus: 2.2 mg/dL — ABNORMAL LOW (ref 2.5–4.6)

## 2019-01-29 MED ORDER — FUROSEMIDE 10 MG/ML IJ SOLN
120.0000 mg | Freq: Three times a day (TID) | INTRAVENOUS | Status: DC
  Administered 2019-01-29 – 2019-02-01 (×9): 120 mg via INTRAVENOUS
  Filled 2019-01-29 (×4): qty 12
  Filled 2019-01-29: qty 2
  Filled 2019-01-29: qty 12
  Filled 2019-01-29: qty 10
  Filled 2019-01-29: qty 12
  Filled 2019-01-29 (×3): qty 10

## 2019-01-29 MED ORDER — STERILE WATER FOR INJECTION IV SOLN
INTRAVENOUS | Status: AC
  Administered 2019-01-29: 18:00:00 via INTRAVENOUS
  Filled 2019-01-29: qty 616

## 2019-01-29 NOTE — Progress Notes (Signed)
[facility] ROUNDING NOTE   Subjective: No acute events overnight.  Had one liquid bowel movement this morning.  She cannot recall number/consistency of bowel movements she had yesterday.  No appetite this morning, and did not want to trial any breakfast.  Creatinine mildly improved to 2.3 (from 2.5 yesterday).  Transfused 2 units PRBCs yesterday with hemoglobin 10.7 today (6.7 yesterday prior to transfusion).   Objective: Vital signs in last 24 hours: Temp:  [98.4 F (36.9 C)-99.1 F (37.3 C)] 98.4 F (36.9 C) (08/08 1337) Pulse Rate:  [74-88] 74 (08/08 1337) Resp:  [14-18] 14 (08/08 1337) BP: (135-162)/(64-97) 138/64 (08/08 1337) SpO2:  [93 %-99 %] 99 % (08/08 1337) Weight:  [73.8 kg] 73.8 kg (08/08 0539) Last BM Date: 01/25/19 General: NAD Abdomen: Soft, NT, ND   Intake/Output from previous day: 08/07 0701 - 08/08 0700 In: 2708.5 [P.O.:490; I.V.:1259.5; Blood:719; IV Piggyback:240] Out: 1900 [Urine:1900] Intake/Output this shift: Total I/O In: 60 [P.O.:60] Out: 950 [Urine:950]   Lab Results: Recent Labs    01/27/19 0441 01/28/19 0305 01/29/19 0903  WBC 7.2 7.4  --   HGB 7.0* 6.7* 10.7*  PLT 136* 138*  --   MCV 101.3* 102.9*  --    BMET Recent Labs    01/27/19 0441 01/28/19 0305 01/29/19 0455  NA 144 145 144  K 3.4* 3.8 4.1  CL 109 109 108  CO2 26 27 28   GLUCOSE 95 128* 121*  BUN 52* 50* 50*  CREATININE 2.64* 2.54* 2.30*  CALCIUM 8.7* 9.0 9.0   LFT Recent Labs    01/28/19 0305  PROT 6.0*  ALBUMIN 4.5  AST 32  ALT 11  ALKPHOS 88  BILITOT 2.4*   PT/INR No results for input(s): INR in the last 72 hours.    Imaging/Other results: No results found.    Assessment and Plan: #1 68 year old female, admitted 3+ weeks ago with progressive weakness and failure to thrive in setting of several month history of diarrhea, lack of appetite and weight loss of 25 pounds.  This is in setting of long history of rheumatoid arthritis, chronic  kidney disease and prior history of necrotizing glomerulonephritis, hypertension and COPD. 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  Mildly active nonspecific colitis on biopsies colonoscopy this admit, suspect underlying IBD -Has had some improvement since starting Entocort 9 mg p.o. daily earlier this week -Do suspect that there is likely a component of underlying IBD, which had been largely quiesced sent due to treatment for RA.  Holding off on trial of systemic steroids in favor of monitoring for continued improvement with topical  #2 Severe protein calorie malnutrition, multifactoral.  Patient has been unable to tolerate enteral tube feeds and pulled the tube immediately after placed.  Has had some improvement in appetite since starting Marinol.  - Resume TPN with Pharmacist management - Resume Marinol -Continue to encourage p.o. intake -Continue to monitor intake/output  #3 Acute kidney injury on chronic underlying kidney disease-nephrology has been following urine sodium less than 10 and treating as HRS. #4 Anasarca-secondary to hypoalbuminemia which is multifactorial in nature (FTT, cirrhosis, CKD, etc.)  - IV Lasix and albumin per Nephrology -Creatinine slowly improving  #5 new diagnosis of cirrhosis, decompensated, etiology  not clear.  All markers negative. Question secondary to fatty liver disease versus drug-induced with long history of methotrexate use for RA.  #6 acute on chronic anemia -Robust improvement in hemoglobin after 2 unit PRBC transfusion yesterday. -No overt GI blood loss - Recheck H/H tomorrow morning     Lavena Bullion, DO  01/29/2019, 2:30 PM [facility] Pager [phone]

## 2019-01-29 NOTE — Progress Notes (Signed)
[facility] Progress Note  Subjective: +800 cc last 24hrs, UOP 1900 on IV lasix. No new c/o.   Vitals:   01/29/19 0539 01/29/19 0925 01/29/19 1041 01/29/19 1337  BP: (!) 159/97 (!) 157/89 (!) 145/84 138/64  Pulse: 87 84 88 74  Resp: _0 Temp: 99.1 F (37.3 C) 98.8 F (37.1 C) 98.8 F (37.1 C) 98.4 F (36.9 C)  TempSrc: Oral Oral Oral Oral  SpO2: 95% 96% 97% 99%  Weight: 73.8 kg     Height:        Inpatient medications: . sodium chloride   Intravenous Once  . budesonide  9 mg Oral Daily  . calcium carbonate  500 mg of elemental calcium Oral BID   And  . cholecalciferol  200 Units Oral BID  . dronabinol  2.5 mg Oral BID AC  . escitalopram  10 mg Oral Daily  . insulin aspart  0-15 Units Subcutaneous Q6H  . levothyroxine  88 mcg Oral Q0600  . magnesium oxide  400 mg Oral BID  . ondansetron  4 mg Intravenous Q8H  . pantoprazole  40 mg Oral BID  . potassium chloride  40 mEq Oral BID  . sodium bicarbonate  650 mg Oral BID   . albumin human 50 g (01/29/19 0931)  . furosemide 120 mg (01/29/19 1423)  . TPN ADULT (ION) 70 mL/hr at 01/28/19 1718  . TPN ADULT (ION)     acetaminophen, albuterol, diclofenac sodium, loperamide, morphine injection, ondansetron, oxyCODONE, phenol, sodium chloride flush, sodium chloride flush    Exam: Gen alert, chron ill appearing No rash, cyanosis or gangrene Sclera anicteric, throat clear  No jvd or bruits Chest clear bilat except some dec'd BS L base RRR no MRG Abd soft ntnd 2-3+ ascites diffusely MS no joint effusions or deformity Ext 2-3+ diffuse ext edema, no wounds or ulcer Neuro is alert, Ox 3 , nf , no asterixis    Home meds:  - amlodipine 2.5/ metoprolol xl 100 qd  - escitalopram 10 qd  - sod bicarb bid/ Kdur 10 qd/ proair hfa prn/ levothyroxine 88 qd  - prn's/ vitamins/ supplements   CT abd /pelvis 01/06/19 > 1. Abnormal appearance of the liver with heterogeneous hepatic steatosis and heterogeneous  liver enhancement. Questionable fine liver surface irregularity. Cannot exclude hepatic cirrhosis. Nodiscrete liver masses. Recommend correlation with liver enzymes. 2. Third-spacing of fluid with moderate ascites, small dependent bilateral pleural effusions, right greater than left, and mild anasarca. 3. No evidence of bowel obstruction or acute bowel inflammation. Mild colonic diverticulosis, without convincing findings of acute diverticulitis. 4. Patchy bandlike consolidation in the left lung base, cannot exclude pneumonia or aspiration.    UA 7/21 > negative  UNa 8/2 <10 Renal US (8/4) 8.6 L and  9 cm R kidneys w/o hydro bilat  Assessment/ Plan:  AKI on CKD 3/4- w/ low UNa / severe ascites suspect HRS. EF 60% by echo.  Not hypotensive. Alb very low.  Have ^'d IV lasix to 100 tid so we can keep up w/ the TNA. Pt is diuresing some and creat is improving down to 2.30 from peak 2.7.  Sig vol overload persists. Cont supportive care. No indication for dialysis at this time.   CKD 3b - baseline creat 1.5, eGFR 32, due to her known hx of ANCA+ GN treated 2019  Ascites/ cirrhosis w/ portal HTN - new diagnosis, possibly due to MTX vs fatty liver, GI is following  Vol overload/ anasarca/  hypoalbuminemia-  as above, avoid any IVF's except D5W or D10 if needed for low BS  Malnutrition - this and liver dz contributing to low alb   [facility] 01/29/2019, 5:44 PM  Iron/TIBC/Ferritin/ %Sat    Component Value Date/Time   IRON 81 01/07/2019 0928   TIBC NOT CALCULATED 01/07/2019 0928   FERRITIN 498 (H) 01/07/2019 0928   IRONPCTSAT NOT CALCULATED 01/07/2019 0928   Recent Labs  Lab 01/28/19 0305 01/29/19 0455  NA 145 144  K 3.8 4.1  CL 109 108  CO2 27 28  GLUCOSE 128* 121*  BUN 50* 50*  CREATININE 2.54* 2.30*  CALCIUM 9.0 9.0  PHOS 2.4* 2.2*  ALBUMIN 4.5  --    Recent Labs  Lab 01/28/19 0305  AST 32  ALT 11  ALKPHOS 88  BILITOT 2.4*  PROT 6.0*   Recent  Labs  Lab 01/28/19 0305 01/29/19 0903  WBC 7.4  --   HGB 6.7* 10.7*  HCT 21.5* 33.2*  PLT 138*  --

## 2019-01-29 NOTE — Progress Notes (Signed)
PHARMACY - ADULT TOTAL PARENTERAL NUTRITION CONSULT NOTE   Pharmacy Consult for TPN Indication: intolerance to enteral feeding (refusal to place NGT)   Patient Measurements: Height: 5\' 1"  (154.9 cm) Weight: 162 lb 11.2 oz (73.8 kg) IBW/kg (Calculated) : 47.8 TPN AdjBW (KG): 53.8 Body mass index is 30.74 kg/m. Usual Weight: ~70 kg  HPI: 51 yoF with PMH HTN, HLD, COPD, asthma, RA previously on immune modulators, h/o necrotizing glomerulonephritis/vasculitis (+ ANCA), and pancytopenia secondary to cyclophosphamide. She presented to the ED with complaints of chronic diarrhea, intermittent n/v, and some blood noted in stool. During 07/2018 admission in [place], EGD/colonoscopy showed an ulcerated mass-like lesion in the ileocecal valve with satellite ulcers. 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 New diagnosis of cirrhosis this admission with portal HTN and severe ascites.  EGD here still shows duodenal ulcer and distal esophagitis; very poor PO intake. Core tract feeding tube placed 8/3 but subsequently dislodged. Patient did not tolerate replacement of NGT and now refusing any further attempt at replacement. Pharmacy consulted to dose TPN. Per RD, considered at-risk for refeeding  Central access: PICC placed 7/31 TPN start date: 8/6  Significant events:  8/8: phosphorus remains low, will not bolus with fluid overload, will increase in TPN, will not advance TPN rate today. Conisidering dialysis if intake, fluid volume not improved, Renal following.  ASSESSMENT                                                                                                           Today, 01/29/2019:  Glucose (CBG goal 100-150) CBG's 132-147 past 24 hr  8 units moderate SSI past 24 hr  Systemic absorption of Entocort is fairly low (< 20%)  Electrolytes - WNL except Phos decreased further  Continued po supplements KDur 36mEq bid, MagOx 400mg  bid, NaBicarb 650mg  bid,  Ca bid  Renal - acute on CKD, concern for hepatorenal vs cardiorenal; SCr decreasing; bicarb remains WNL; robust UOP with diuresis, Lasix 120mg  IV q8hr , 1750 ml output/24 hr  I/O - still with significant ascites, diuresing with Lasix and albumin 8/6 > 8/8  LFTs - improved to WNL, Tbili remains elevated; albumin WNL but only d/t multiple doses of albumin for ascites  TGs - WNL  Prealbumin - low  Current Nutrition - salt-restricted diet, minimal intake  IVF - none  NUTRITIONAL GOALS                                                                                             RD recs (8/6): Kcal:  2000-2200 kcal Protein:  100-115 grams Fluid:  >/= 2.5 L/day  PLAN  At 1800 today:  Continue TPN at 70 ml/hr  TPN at goal rate of 85 ml/hr provides 112 g of protein and 2101 kcals, meeting 100% of patient needs  Will utilize concentrated (15%) amino acids d/t volume overload and ascites  Add MVI daily and trace elements MWF (due to national shortage)  Electrolytes: wnl, exc Phos low,Cl:Ac 1:1  IVF (none currently) per MD  Continue moderate SSI with q6 hr CBG checks  TPN lab panels on Mondays & Thursdays  BMET, Mag, Phos levels in am  Minda Ditto PharmD 01/29/2019, 7:50 AM

## 2019-01-29 NOTE — Progress Notes (Signed)
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 She was admitted for blood in stool and chronic diarrhea and abnormal liver function. She's now s/p colonoscopy with GI  Assessment & Plan:   Principal Problem:   ARF (acute renal failure) (HCC) Active Problems:   Rheumatoid arthritis involving multiple sites with positive rheumatoid factor (HCC)   History of hypertension   History of hypothyroidism   Abnormal liver function   Diarrhea   Pulmonary nodule   Metabolic acidosis   Severe protein-calorie malnutrition (HCC)   Rectal bleeding   LFTs abnormal   Cirrhosis (HCC)   Pancreatic insufficiency   Ascites   AKI (acute kidney injury) ([place])  Blood in stool, CMV colitis - GI was consulted, appreciate recommendations - Colonoscopy was performed with findings of localized inflammation at ileocecal valve, external and internal hemorrhoids; see report -CMV found to be negative - No report of further bleeding noted currently  Abnormal liver function with hepatorenal syndrome - GI following - Earlier MRCP demonstrated severe diffuse hepatic steatosis (can't exclude cirrhosis) - solitary subcentimeter R liver lesion, likely benign cyst  - Unclear etiology, although had been on methotrexate - Large ascites and worsening renal function concerning for hepatorenal syndrome, see below - Pt is s/p large volume paracentesis yielding 4.5L on 7/22 - Liver US performed: 1. Hepatic cirrhosis.  Moderate volume ascites, most prominent in the right lower quadrant. 2. Minimal to and fro flow within the main portal and splenic veins. No discrete thrombosis appreciated. 3. Slow hepatopetal flow within the right and left portal veins. 4. Patent paraumbilical varix with hepatofugal flow.  Chronic diarrhea - gliadin antibodies - wnl, TTG - normal - Cholestyramine prn - Initially resolved with colestid BID, imodium, and creon - Colestid stopped 7/31 as it may contribute to nausea, also to get a better assessment of diarrhea - Entocort started 7/31  Community Acquired Pneumonia - Bld Cx Neg  - CXR with patchy airspace dz in L lung base (early pneumonia vs sequelae of aspiration) - Repeat CXR with small bilateral effusions and associated atelectasis vs consolidation - Completed course of Ceftriaxone/azithro x 5 days - Remains afebrile  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 - Concern for hepatorenal syndrome - Baseline Cr around 1.5 - SCr currently 2.3 - nephro onboard; IV albumin/lasix  Hypotension/Hx of Hypertension - Continue holding antihypertensives for now - BP remains stable     - getting lasix  Non- AG acidosis - resolved  N/V - Zofran 4mg  iv q6h prn - Colestid recently stopped by GI as it may contribute to nausea - presently stable at this time - NGT to be placed today for nutrition  Hypothyroidism - Cont Levothyroxine 88 micrograms po qday as tolerated  Anxiety - Cont Lexapro 10mg  po qday as pt tolerates - Currently stable  Normocytic Anemia - stable, no frank bleed noted - Labs reviewed.     - Hgb improved after transfusion   RA  - Currently off Humira, follows with Deveshwar  - continue with Voltaren gel as needed  Pulmonary Nodule - Recommended outpatient follow up  Hypoglycemia - stable, monitor,  encourage diet   Hypomagnesemia/Hypophospatemia - replete, monitor  Debility - PT recs SNF; Plan SNF when medically cleared   Toxic metabolic encephalopathy - resolved  Anasarca - BLE edema and signs of volume overload - 2d echo reviewed, normal LVEF - Given trial of lasix with some improvement. Lasix stopped given concerns of intravascular depletion and dehydration - Suspect anasarca related to marked malnutrition per above - GI recommendation for Cortrak, however pt pulled tube. OK to leave out for now per GI - Cont lasix with albumin per Nephrology   Severe protein calorie malnutrition - anorexia for several months noted - Likely contributing to anasarca above - Encourage PO intake. NG tube lost overnight - OK to leave out for today per GI - NGTwas replaced but she did not want it in - GI to start TPN; PICC in place - TPN started, Marinol started; monitor   She says appetite is picking up with marinol. In bright spirits this morning. Continue as above.   DVT prophylaxis:SCDs Code Status:FULL Disposition Plan:To SNF when stable  Consultants:  GI  IR  Neprhology  Procedures:  Colonoscopy  EGD ROS:  Reports increasing appetite. Denies CP, dyspnea, N/V . Remainder 10-pt ROS is negative for all not previously mentioned.  Subjective: "I may have ate too much."  Objective: Vitals:   01/29/19 0539 01/29/19 0925 01/29/19 1041 01/29/19 1337  BP: (!) 159/97 (!) 157/89 (!) 145/84 138/64  Pulse: 87 84 88 74  Resp: 16 16 16 14   Temp: 99.1 F (37.3 C) 98.8 F (37.1 C) 98.8 F (37.1 C) 98.4 F (36.9 C)  TempSrc: Oral Oral Oral Oral  SpO2: 95% 96% 97% 99%  Weight: 73.8 kg     Height:        Intake/Output Summary (Last 24 hours) at 01/29/2019 1418 Last data filed at 01/29/2019 1338 Gross per 24 hour  Intake 2768.49 ml  Output 2850 ml  Net -81.51 ml   Filed Weights   01/27/19 1520 01/28/19 0529 01/29/19 0539   Weight: 70.9 kg 72.1 kg 73.8 kg    Examination:  General:68 y.o.femaleresting in bed in NAD;in better spirits this morning Eyes: PERRL, normal sclera ENMT: Nares patent w/o discharge, orophaynx clear, dentition normal, ears w/o discharge/lesions/ulcers Cardiovascular: RRR, +S1, S2, no m/g/r, equal pulses throughout Respiratory: CTABL, no w/r/r, normal WOB GI: BS+, NT, distended but soft, no masses noted, no organomegaly noted MSK:ble edema improving Skin: No rashes, bruises, ulcerations noted Neuro:alert to name, follows commands, no focal deficits Psyc: Appropriate interaction and affect, calm/cooperative  Data Reviewed: I have personally reviewed following labs and imaging studies.  CBC: Recent Labs  Lab 01/24/19 0350 01/25/19 0507 01/26/19 0446 01/27/19 0441 01/28/19 0305 01/29/19 0903  WBC 8.1 7.7 8.0 7.2 7.4  --   NEUTROABS  --   --   --  5.2 5.4  --   HGB 8.7* 7.7* 7.0* 7.0* 6.7* 10.7*  HCT 28.2* 24.7* 21.9* 22.8* 21.5* 33.2*  MCV 100.0 100.0 100.9* 101.3* 102.9*  --   PLT 155 151 141* 136* 138*  --    Basic Metabolic Panel: Recent Labs  Lab 01/24/19 0350 01/24/19 1551 01/25/19 0507 01/25/19 1725 01/26/19 0446 01/27/19 0441 01/28/19 0305 01/29/19 0455  NA 141  --  144  --  144 144 145 144  K 4.1  --  3.5  --  2.9* 3.4* 3.8 4.1  CL 110  --  111  --  108 109 109 108  CO2 24  --  24  --  26 26 27 28   GLUCOSE 95  --  104*  --  81 95 128* 121*  BUN 59*  --  58*  --  56* 52* 50* 50*  CREATININE 2.78*  --  2.77*  --  2.74* 2.64* 2.54* 2.30*  CALCIUM 7.8*  --  8.5*  --  8.8* 8.7* 9.0 9.0  MG 2.0  --  2.0  --  1.9  --  1.9 1.8  PHOS  --  3.2 3.4 3.5  --   --  2.4* 2.2*   GFR: Estimated Creatinine Clearance: 21.8 mL/min (A) (by C-G formula based on SCr of 2.3 mg/dL (H)). Liver Function Tests: Recent Labs  Lab 01/23/19 0455 01/24/19 0350 01/25/19 0507 01/26/19 0446 01/28/19 0305  AST 59* 65* 53* 37 32  ALT 15 17 15 13 11   ALKPHOS 187* 205* 169*  119 88  BILITOT 2.0* 1.9* 2.4* 3.1* 2.4*  PROT 4.6* 4.6* 5.3* 5.7* 6.0*  ALBUMIN 1.7* 1.6* 3.0* 4.0 4.5   No results for input(s): LIPASE, AMYLASE in the last 168 hours. No results for input(s): AMMONIA in the last 168 hours. Coagulation Profile: No results for input(s): INR, PROTIME in the last 168 hours. Cardiac Enzymes: No results for input(s): CKTOTAL, CKMB, CKMBINDEX, TROPONINI in the last 168 hours. BNP (last 3 results) No results for input(s): PROBNP in the last 8760 hours. HbA1C: No results for input(s): HGBA1C in the last 72 hours. CBG: Recent Labs  Lab 01/28/19 1159 01/28/19 1739 01/29/19 0125 01/29/19 0551 01/29/19 1212  GLUCAP 141* 147* 142* 115* 128*   Lipid Profile: Recent Labs    01/28/19 0305  TRIG 112   Thyroid Function Tests: No results for input(s): TSH, T4TOTAL, FREET4, T3FREE, THYROIDAB in the last 72 hours. Anemia Panel: No results for input(s): VITAMINB12, FOLATE, FERRITIN, TIBC, IRON, RETICCTPCT in the last 72 hours. Sepsis Labs: No results for input(s): PROCALCITON, LATICACIDVEN in the last 168 hours.  Recent Results (from the past 240 hour(s))  Body fluid culture     Status: None   Collection Time: 01/25/19 11:48 AM   Specimen: Peritoneal Washings; Body Fluid  Result Value Ref Range Status   Specimen Description   Final    PERITONEAL Performed at [facility], [place] [address], [place], [place] [address]    Special Requests   Final    NONE Performed at [facility], [place] [address], [place], [place] [address]    Gram Stain   Final    WBC PRESENT, PREDOMINANTLY MONONUCLEAR NO ORGANISMS SEEN CYTOSPIN SMEAR    Culture   Final    NO GROWTH 3 DAYS Performed at [facility], [place] [address], [place], [place] [address]    Report Status 01/28/2019 FINAL  Final      Radiology Studies: No results found.   Scheduled Meds: . sodium chloride   Intravenous Once  . budesonide  9 mg Oral Daily  . calcium  carbonate  500 mg of elemental calcium Oral BID   And  . cholecalciferol  200 Units Oral BID  . dronabinol  2.5 mg Oral BID AC  . escitalopram  10 mg Oral Daily  . insulin aspart  0-15 Units Subcutaneous Q6H  . levothyroxine  88 mcg Oral Q0600  . magnesium oxide  400 mg Oral BID  . ondansetron  4 mg Intravenous Q8H  . pantoprazole  40 mg Oral BID  . potassium chloride  40 mEq Oral BID  . sodium bicarbonate  650 mg Oral BID   Continuous Infusions: . albumin human 50 g (01/29/19 0931)  . furosemide    . TPN ADULT (ION) 70 mL/hr at 01/28/19 1718  . TPN ADULT (ION)       LOS: 22 days    Time spent: 25 minutes spent in the coordination of care today.   Jonnie Finner, DO [facility] Pager [phone]  If 7PM-7AM, please contact night-coverage [email or web address] Password [email or web address] 01/29/2019, 2:18 PM

## 2019-01-30 LAB — BASIC METABOLIC PANEL
Anion gap: 11 (ref 5–15)
BUN: 64 mg/dL — ABNORMAL HIGH (ref 8–23)
CO2: 30 mmol/L (ref 22–32)
Calcium: 9.4 mg/dL (ref 8.9–10.3)
Chloride: 106 mmol/L (ref 98–111)
Creatinine, Ser: 2.26 mg/dL — ABNORMAL HIGH (ref 0.44–1.00)
GFR calc Af Amer: 25 mL/min — ABNORMAL LOW (ref >60)
GFR calc non Af Amer: 22 mL/min — ABNORMAL LOW (ref >60)
Glucose, Bld: 112 mg/dL — ABNORMAL HIGH (ref 70–99)
Potassium: 4.1 mmol/L (ref 3.5–5.1)
Sodium: 147 mmol/L — ABNORMAL HIGH (ref 135–145)

## 2019-01-30 LAB — GLUCOSE, CAPILLARY
Glucose-Capillary: 101 mg/dL — ABNORMAL HIGH (ref 70–99)
Glucose-Capillary: 114 mg/dL — ABNORMAL HIGH (ref 70–99)
Glucose-Capillary: 116 mg/dL — ABNORMAL HIGH (ref 70–99)
Glucose-Capillary: 140 mg/dL — ABNORMAL HIGH (ref 70–99)
Glucose-Capillary: 152 mg/dL — ABNORMAL HIGH (ref 70–99)

## 2019-01-30 LAB — PHOSPHORUS: Phosphorus: 2.9 mg/dL (ref 2.5–4.6)

## 2019-01-30 LAB — MAGNESIUM: Magnesium: 1.9 mg/dL (ref 1.7–2.4)

## 2019-01-30 MED ORDER — STERILE WATER FOR INJECTION IV SOLN
INTRAVENOUS | Status: AC
  Administered 2019-01-30: 18:00:00 via INTRAVENOUS
  Filled 2019-01-30: qty 748

## 2019-01-30 NOTE — Progress Notes (Signed)
GASTROENTEROLOGY ROUNDING NOTE   Subjective: No acute events overnight.  She states that her appetite and p.o. intake have improved.  Two loose, nonbloody BMs today.  Creatinine 2.26.  No repeat hemoglobin today for review.  Otherwise, largely without any complaints.  Objective: Vital signs in last 24 hours: Temp:  [98.5 F (36.9 C)-98.8 F (37.1 C)] 98.5 F (36.9 C) (08/09 0612) Pulse Rate:  [85-97] 85 (08/09 0612) Resp:  [16-20] 16 (08/09 0612) BP: (147-148)/(90-91) 147/90 (08/09 0612) SpO2:  [97 %] 97 % (08/09 0612) Weight:  [73.8 kg] 73.8 kg (08/09 0612) Last BM Date: 01/30/19 General: NAD Abdomen: Soft, NT, ND    Intake/Output from previous day: 08/08 0701 - 08/09 0700 In: 2361 [P.O.:260; I.V.:1460.4; IV Piggyback:640.5] Out: 2150 [Urine:2150] Intake/Output this shift: Total I/O In: 1222.4 [P.O.:417; I.V.:755.4; IV Piggyback:50] Out: 1000 [Urine:1000]   Lab Results: Recent Labs    01/28/19 0305 01/29/19 0903  WBC 7.4  --   HGB 6.7* 10.7*  PLT 138*  --   MCV 102.9*  --    BMET Recent Labs    01/28/19 0305 01/29/19 0455 01/30/19 0629  NA 145 144 147*  K 3.8 4.1 4.1  CL 109 108 106  CO2 27 28 30   GLUCOSE 128* 121* 112*  BUN 50* 50* 64*  CREATININE 2.54* 2.30* 2.26*  CALCIUM 9.0 9.0 9.4   LFT Recent Labs    01/28/19 0305  PROT 6.0*  ALBUMIN 4.5  AST 32  ALT 11  ALKPHOS 88  BILITOT 2.4*   PT/INR No results for input(s): INR in the last 72 hours.    Imaging/Other results: No results found.   Assessment and Plan: #68 68 year old female, admitted 3+ weeks ago with progressive weakness and failure to thrive in setting of several month history of diarrhea, lack of appetite and weight loss of 25 pounds. This is in setting of long history of rheumatoid arthritis, chronic kidney disease and prior history of necrotizing glomerulonephritis, hypertension and COPD. 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 She had been on Humira for RA, apparently that had been stopped prior to developing the CMV colitis. Patient continued to have diarrhea which never cleared, and then progressively worsened over the past 3 months.  Mildly active nonspecific colitis on biopsies colonoscopy this admit, suspect underlying IBD -Has had improvement since starting Entocort 9 mg p.o. daily -Resume Entocort -Holding off on adding systemic steroids   #2 Severe protein calorie malnutrition, multifactoral. Patient has been unable to tolerate enteral tube feedsand pulled the tube immediately after placed.  Has had some improvement in appetite since starting Marinol.  - Resume TPN with Pharmacist management - Phos 2.9 - Resume Marinol -Continue to encourage p.o. intake -Continue to monitor intake/output  #3 Acute kidney injury on chronic underlying kidney disease-nephrology has been following urine sodium less than 10 and treating as HRS. #4 Anasarca-secondary to hypoalbuminemia which is multifactorial in nature (FTT, cirrhosis, CKD, etc.)  - Nephrology following closely for suspected HRS -Mild improvement in creatinine to 2.26 today  #5 new diagnosis of cirrhosis, decompensated, etiology not clear. All markers negative. Question secondary to fatty liver disease versus drug-induced with long history of methotrexate use for RA. -Resume supportive care as already doing - Diuretic management per Nephrology  #6 acute on chronic anemia -Robust improvement in hemoglobin after 2 unit PRBC transfusion -No overt GI blood loss -  Please recheck H/H tomorrow morning  I had a long conversation with the patient's husband this afternoon.  Explained that while she has made some improvements in the last 24 to 48 hours, overall prognosis remains guarded.  We did briefly discuss goals of care.  They would like to continue with current aggressive management, but do feel  that consideration for input from Palliative Care to discuss goals of care could be reasonable in the coming days pending response to current therapy.   Lavena Bullion, DO  01/30/2019, 4:56 PM [facility] Pager [phone]

## 2019-01-30 NOTE — Progress Notes (Signed)
PHARMACY - ADULT TOTAL PARENTERAL NUTRITION CONSULT NOTE   Pharmacy Consult for TPN Indication: intolerance to enteral feeding (refusal to place NGT)   Patient Measurements: Height: 5\' 1"  (154.9 cm) Weight: 162 lb 11.2 oz (73.8 kg) IBW/kg (Calculated) : 47.8 TPN AdjBW (KG): 53.8 Body mass index is 30.74 kg/m. Usual Weight: ~70 kg  HPI: 40 yoF with PMH HTN, HLD, COPD, asthma, RA previously on immune modulators, h/o necrotizing glomerulonephritis/vasculitis (+ ANCA), and pancytopenia secondary to cyclophosphamide. She presented to the ED with complaints of chronic diarrhea, intermittent n/v, and some blood noted in stool. During 07/2018 admission in [place], EGD/colonoscopy showed an ulcerated mass-like lesion in the ileocecal valve with satellite ulcers. 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 New diagnosis of cirrhosis this admission with portal HTN and severe ascites.  EGD here still shows duodenal ulcer and distal esophagitis; very poor PO intake. Core tract feeding tube placed 8/3 but subsequently dislodged. Patient did not tolerate replacement of NGT and now refusing any further attempt at replacement. Pharmacy consulted to dose TPN. Per RD, considered at-risk for refeeding  Central access: PICC placed 7/31 TPN start date: 8/6  Significant events:  8/8: phosphorus remains low, will not bolus with fluid overload, will increase in TPN, will not advance TPN rate today. Conisidering dialysis if intake, fluid volume not improved, Renal following.  ASSESSMENT                                                                                                           Today, 01/30/2019:  Glucose (CBG goal 100-150) CBG's 116-151 past 24 hr  7 units moderate SSI past 24 hr  Systemic absorption of Entocort is fairly low (< 20%)  Electrolytes - WNL except Phos decreased further  Continued po supplements KDur 75mEq bid, MagOx 400mg  bid, NaBicarb 650mg  bid,  Ca bid  Renal - acute RF on CKD, concern for hepatorenal vs cardiorenal; SCr decreasing; bicarb remains WNL; robust UOP with diuresis, Lasix 120mg  IV q8hr , 2250 ml output/24 hr  I/O - ascites, diuresing with Lasix and albumin 8/6 > 8/8  LFTs - improved to WNL, Tbili remains elevated; albumin WNL but only d/t multiple doses of albumin for ascites  TGs - WNL  Prealbumin - low  Current Nutrition - salt-restricted diet, minimal intake  IVF - none  NUTRITIONAL GOALS                                                                                             RD recs (8/6): Kcal:  2000-2200 kcal Protein:  100-115 grams Fluid:  >/= 2.5 L/day  PLAN  At 1800 today:  increase TPN to goal rate 85 ml/hr  TPN at goal rate of 85 ml/hr provides 112 g of protein and 2101 kcals, meeting 100% of patient needs  Will utilize concentrated (15%) amino acids d/t volume overload and ascites  Add MVI daily and trace elements MWF (due to national shortage)  Electrolytes: wnl, exc Phos low,Cl:Ac 1:1  IVF (none currently) per MD  Continue moderate SSI with q6 hr CBG checks  TPN lab panels on Mondays & Thursdays  Minda Ditto PharmD 01/30/2019, 7:11 AM

## 2019-01-30 NOTE — Progress Notes (Signed)
[facility] Progress Note  Subjective: looks more alert today, creat down slightly to 2.2, I/O's even yesterday  Vitals:   01/29/19 1041 01/29/19 1337 01/29/19 2122 01/30/19 0612  BP: (!) 145/84 138/64 (!) 148/91 (!) 147/90  Pulse: 88 74 97 85  Resp: _0 Temp: 98.8 F (37.1 C) 98.4 F (36.9 C) 98.8 F (37.1 C) 98.5 F (36.9 C)  TempSrc: Oral Oral Oral Oral  SpO2: 97% 99% 97% 97%  Weight:    73.8 kg  Height:        Inpatient medications: . sodium chloride   Intravenous Once  . budesonide  9 mg Oral Daily  . calcium carbonate  500 mg of elemental calcium Oral BID   And  . cholecalciferol  200 Units Oral BID  . dronabinol  2.5 mg Oral BID AC  . escitalopram  10 mg Oral Daily  . insulin aspart  0-15 Units Subcutaneous Q6H  . levothyroxine  88 mcg Oral Q0600  . magnesium oxide  400 mg Oral BID  . ondansetron  4 mg Intravenous Q8H  . pantoprazole  40 mg Oral BID  . potassium chloride  40 mEq Oral BID  . sodium bicarbonate  650 mg Oral BID   . furosemide 120 mg (01/30/19 0603)  . TPN ADULT (ION) 70 mL/hr at 01/30/19 0700  . TPN ADULT (ION)     acetaminophen, albuterol, diclofenac sodium, loperamide, morphine injection, ondansetron, oxyCODONE, phenol, sodium chloride flush, sodium chloride flush    Exam: Gen alert, chron ill appearing No rash, cyanosis or gangrene Sclera anicteric, throat clear  No jvd or bruits Chest clear bilat except some dec'd BS L base RRR no MRG Abd soft ntnd 2-3+ ascites diffusely MS no joint effusions or deformity Ext 2+ bilat LE edema, no wounds or ulcer Neuro is alert, Ox 3 , nf , no asterixis    Home meds:  - amlodipine 2.5/ metoprolol xl 100 qd  - escitalopram 10 qd  - sod bicarb bid/ Kdur 10 qd/ proair hfa prn/ levothyroxine 88 qd  - prn's/ vitamins/ supplements   CT abd /pelvis 01/06/19 > 1. Abnormal appearance of the liver with heterogeneous hepatic steatosis and heterogeneous liver enhancement.  Questionable fine liver surface irregularity. Cannot exclude hepatic cirrhosis. Nodiscrete liver masses. Recommend correlation with liver enzymes. 2. Third-spacing of fluid with moderate ascites, small dependent bilateral pleural effusions, right greater than left, and mild anasarca. 3. No evidence of bowel obstruction or acute bowel inflammation. Mild colonic diverticulosis, without convincing findings of acute diverticulitis. 4. Patchy bandlike consolidation in the left lung base, cannot exclude pneumonia or aspiration.  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 EF 60% by echo.  Not hypotensive. Alb very low.  Have ^'d IV lasix to 100 tid so we can keep up w/ the TNA. Creat continues to slowly improve (peak 2.7) down to 2.2 today. Mentation is better the last 48hrs. No indication for RRT at this time. Prognosis remains guarded, have spoken w/ husband today.   CKD 3b - baseline creat 1.5, eGFR 32, due to her known hx of ANCA+ GN treated 2019  Ascites/ cirrhosis w/ portal HTN - new diagnosis, possibly due  to MTX vs fatty liver, GI is following  Vol overload/ anasarca/ hypoalbuminemia-  as above, avoid any IVF's except D5W or D10 if needed for low BS  Malnutrition - this and liver dz contributing to low alb   [facility] 01/30/2019, 3:02 PM  Iron/TIBC/Ferritin/ %Sat    Component Value Date/Time   IRON 81 01/07/2019 0928   TIBC NOT CALCULATED 01/07/2019 0928   FERRITIN 498 (H) 01/07/2019 0928   IRONPCTSAT NOT CALCULATED 01/07/2019 0928   Recent Labs  Lab 01/28/19 0305  01/30/19 0629  NA 145   < > 147*  K 3.8   < > 4.1  CL 109   < > 106  CO2 27   < > 30  GLUCOSE 128*   < > 112*  BUN 50*   < > 64*  CREATININE 2.54*   < > 2.26*  CALCIUM 9.0   < >  9.4  PHOS 2.4*   < > 2.9  ALBUMIN 4.5  --   --    < > = values in this interval not displayed.   Recent Labs  Lab 01/28/19 0305  AST 32  ALT 11  ALKPHOS 88  BILITOT 2.4*  PROT 6.0*   Recent Labs  Lab 01/28/19 0305 01/29/19 0903  WBC 7.4  --   HGB 6.7* 10.7*  HCT 21.5* 33.2*  PLT 138*  --

## 2019-01-30 NOTE — Progress Notes (Signed)
[name]  PROGRESS NOTE    Kaylee Rasmussen  FMB:[id number] DOB: 09-17-1950 DOA: 01/06/2019 PCP: Nicoletta Dress, MD   Brief Narrative:   68 y.o.female,hypertension, hyperlipidemia, Asthma, Rheumatoid arthritis (formerly on humira->CMV colitis, HSV esophagitis February 2020), h/o necrotizing glomerulonephritis/ vasculitis(+ ANCA), pancytopenia secondary to cyclophosphamide , has c/o chronic diarrhea and some intermittent n/v, and apparently noticed some blood in stool and told to come to ER for evaluation by GI. She was admitted for blood in stool and chronic diarrhea and abnormal liver function. She's now s/p colonoscopy with GI   Assessment & Plan:   Principal Problem:   ARF (acute renal failure) (HCC) Active Problems:   Rheumatoid arthritis involving multiple sites with positive rheumatoid factor (HCC)   History of hypertension   History of hypothyroidism   Abnormal liver function   Diarrhea   Pulmonary nodule   Metabolic acidosis   Severe protein-calorie malnutrition (HCC)   Rectal bleeding   LFTs abnormal   Cirrhosis (HCC)   Pancreatic insufficiency   Ascites of liver   AKI (acute kidney injury) ([place])   Blood in stool, CMV colitis - GI was consulted, appreciate recommendations - Colonoscopy was performed with findings of localized inflammation at ileocecal valve, external and internal hemorrhoids; see report -CMV found to be negative - No report of further bleeding noted currently  Abnormal liver function with hepatorenal syndrome - GI following - Earlier MRCP demonstrated severe diffuse hepatic steatosis (can't exclude cirrhosis) - solitary subcentimeter R liver lesion, likely benign cyst  - Unclear etiology, although had been on methotrexate - Large ascites and worsening renal function concerning for hepatorenal syndrome, see below - Pt is s/p large volume paracentesis yielding 4.5L on 7/22 - Liver US performed: 1. Hepatic  cirrhosis. Moderate volume ascites, most prominent in the right lower quadrant. 2. Minimal to and fro flow within the main portal and splenic veins. No discrete thrombosis appreciated. 3. Slow hepatopetal flow within the right and left portal veins. 4. Patent paraumbilical varix with hepatofugal flow.  Chronic diarrhea - gliadin antibodies - wnl, TTG - normal - Cholestyramine prn - Initially resolved with colestid BID, imodium, and creon - Colestid stopped 7/31 as it may contribute to nausea, also to get a better assessment of diarrhea - Entocort started 7/31  Community Acquired Pneumonia - Bld Cx Neg  - CXR with patchy airspace dz in L lung base (early pneumonia vs sequelae of aspiration) - Repeat CXR with small bilateral effusions and associated atelectasis vs consolidation - Completed course of Ceftriaxone/azithro x 5 days - Remains afebrile  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 - Concern for hepatorenal syndrome - Baseline Cr around 1.5 - SCr currently 2.26 - nephro onboard; IV albumin/lasix  Hypotension/Hx of Hypertension - Continue holding antihypertensives for now - BP remains stable     - getting lasix  Non- AG acidosis - resolved  N/V - Zofran 4mg  iv q6h prn - Colestid recently stopped by GI as it may contribute to nausea - presently stable at this time - NGT to be placed today for nutrition  Hypothyroidism - Cont Levothyroxine 88 micrograms po qday as tolerated  Anxiety - Cont Lexapro 10mg  po qday as pt tolerates - Currently stable  Normocytic Anemia - stable, no frank bleed noted - Labs reviewed.     - Hgb improved after transfusion   RA  - Currently off Humira, follows with Deveshwar  - continue with Voltaren gel as needed  Pulmonary Nodule - Recommended outpatient follow up  Hypoglycemia - stable, monitor, encourage diet   Hypomagnesemia/Hypophospatemia - replete, monitor  Debility - PT recs SNF; Plan SNF when medically cleared   Toxic metabolic encephalopathy - resolved  Anasarca - BLE edema and signs of volume overload - 2d echo reviewed, normal LVEF - Given trial of lasix with some improvement. Lasix stopped given concerns of intravascular depletion and dehydration - Suspect anasarca related to marked malnutrition per above - GI recommendation for Cortrak, however pt pulled tube. OK to leave out for now per GI - Cont lasix with albumin per Nephrology   Severe protein calorie malnutrition - anorexia for several months noted - Likely contributing to anasarca above - Encourage PO intake. NG tube lost overnight - OK to leave out for today per GI - NGTwas replaced but she did not want it in - GI to start TPN; PICC in place -TPN started, Marinol started; monitor     - say appetite is picking up  DVT prophylaxis:SCDs Code Status:FULL Disposition Plan:To SNF when stable  Consultants:  GI  IR  Neprhology  Procedures:  Colonoscopy  EGD ROS:  Denies CP, dyspnea, palpitations, N/V . Remainder 10-pt ROS is negative for all not previously mentioned.  Subjective: "I didn't get the ice cream. Did you order it?"  Objective: Vitals:   01/29/19 1041 01/29/19 1337 01/29/19 2122 01/30/19 0612  BP: (!) 145/84 138/64 (!) 148/91 (!) 147/90  Pulse: 88 74 97 85  Resp: 16 14 20 16   Temp: 98.8 F (37.1 C) 98.4 F (36.9 C) 98.8 F (37.1 C) 98.5 F (36.9 C)  TempSrc: Oral Oral Oral Oral  SpO2: 97% 99% 97% 97%  Weight:    73.8 kg  Height:        Intake/Output Summary (Last 24 hours) at 01/30/2019 1343 Last data filed at 01/30/2019 1230 Gross per 24 hour  Intake 2717.95 ml  Output 1200 ml  Net 1517.95 ml   Filed Weights   01/28/19 0529 01/29/19 0539 01/30/19 0612  Weight: 72.1 kg 73.8 kg 73.8 kg    Examination:   General:68 y.o.femaleresting in bed in NAD Eyes: PERRL, normal sclera ENMT: Nares patent w/o discharge, orophaynx clear, dentition normal, ears w/o discharge/lesions/ulcers Cardiovascular: RRR, +S1, S2, no m/g/r, equal pulses throughout Respiratory: CTABL, no w/r/r, normal WOB GI: BS+, NT, distended but soft, no masses noted, no organomegaly noted MSK:ble edema is greatly improved Skin: No rashes, bruises, ulcerations noted Neuro:alert to name, follows commands, no focal deficits Psyc: Appropriate interaction andaffect, calm/cooperative  Data Reviewed: I have personally reviewed following labs and imaging studies.  CBC: Recent Labs  Lab 01/24/19 0350 01/25/19 0507 01/26/19 0446 01/27/19 0441 01/28/19 0305 01/29/19 0903  WBC 8.1 7.7 8.0 7.2 7.4  --   NEUTROABS  --   --   --  5.2 5.4  --   HGB 8.7* 7.7* 7.0* 7.0* 6.7* 10.7*  HCT 28.2* 24.7* 21.9* 22.8* 21.5* 33.2*  MCV 100.0 100.0 100.9* 101.3* 102.9*  --   PLT 155 151 141* 136* 138*  --    Basic Metabolic Panel: Recent Labs  Lab 01/25/19 0507 01/25/19 1725 01/26/19 0446 01/27/19 0441 01/28/19 0305 01/29/19 0455 01/30/19 0629  NA 144  --  144 144 145 144 147*  K 3.5  --  2.9* 3.4* 3.8 4.1 4.1  CL 111  --  108 109 109 108 106  CO2 24  --  26 26 27 28 30   GLUCOSE 104*  --  81 95 128* 121* 112*  BUN 58*  --  56* 52* 50* 50* 64*  CREATININE 2.77*  --  2.74* 2.64* 2.54* 2.30* 2.26*  CALCIUM 8.5*  --  8.8* 8.7* 9.0 9.0 9.4  MG 2.0  --  1.9  --  1.9 1.8 1.9  PHOS 3.4 3.5  --   --  2.4* 2.2* 2.9   GFR: Estimated Creatinine Clearance: 22.2 mL/min (A) (by C-G formula based on SCr of 2.26 mg/dL (H)). Liver Function Tests: Recent Labs  Lab 01/24/19 0350 01/25/19 0507 01/26/19 0446 01/28/19 0305  AST 65* 53* 37 32  ALT 17 15 13 11   ALKPHOS 205* 169* 119 88  BILITOT 1.9* 2.4* 3.1* 2.4*  PROT 4.6* 5.3* 5.7* 6.0*  ALBUMIN 1.6* 3.0* 4.0 4.5   No results for input(s): LIPASE, AMYLASE in the last 168 hours. No  results for input(s): AMMONIA in the last 168 hours. Coagulation Profile: No results for input(s): INR, PROTIME in the last 168 hours. Cardiac Enzymes: No results for input(s): CKTOTAL, CKMB, CKMBINDEX, TROPONINI in the last 168 hours. BNP (last 3 results) No results for input(s): PROBNP in the last 8760 hours. HbA1C: No results for input(s): HGBA1C in the last 72 hours. CBG: Recent Labs  Lab 01/29/19 1212 01/29/19 1749 01/30/19 0013 01/30/19 0553 01/30/19 1145  GLUCAP 128* 151* 140* 116* 114*   Lipid Profile: Recent Labs    01/28/19 0305  TRIG 112   Thyroid Function Tests: No results for input(s): TSH, T4TOTAL, FREET4, T3FREE, THYROIDAB in the last 72 hours. Anemia Panel: No results for input(s): VITAMINB12, FOLATE, FERRITIN, TIBC, IRON, RETICCTPCT in the last 72 hours. Sepsis Labs: No results for input(s): PROCALCITON, LATICACIDVEN in the last 168 hours.  Recent Results (from the past 240 hour(s))  Body fluid culture     Status: None   Collection Time: 01/25/19 11:48 AM   Specimen: Peritoneal Washings; Body Fluid  Result Value Ref Range Status   Specimen Description   Final    PERITONEAL Performed at [facility], [place] [address], [place], [place] [address]    Special Requests   Final    NONE Performed at [facility], [place] [address] [place] [address] [place], [place] [address]    Gram Stain   Final    WBC PRESENT, PREDOMINANTLY MONONUCLEAR NO ORGANISMS SEEN CYTOSPIN SMEAR    Culture   Final    NO GROWTH 3 DAYS Performed at [facility], [place] [address], [place],  [address]    Report Status 01/28/2019 FINAL  Final      Radiology Studies: No results found.   Scheduled Meds: . sodium chloride   Intravenous Once  . budesonide  9 mg Oral Daily  . calcium carbonate  500 mg of elemental calcium Oral BID   And  . cholecalciferol  200 Units Oral BID  . dronabinol  2.5 mg Oral BID AC  . escitalopram  10 mg Oral Daily  . insulin aspart   0-15 Units Subcutaneous Q6H  . levothyroxine  88 mcg Oral Q0600  . magnesium oxide  400 mg Oral BID  . ondansetron  4 mg Intravenous Q8H  . pantoprazole  40 mg Oral BID  . potassium chloride  40 mEq Oral BID  . sodium bicarbonate  650 mg Oral BID   Continuous Infusions: . furosemide 120 mg (01/30/19 0603)  . TPN ADULT (ION) 70 mL/hr at 01/30/19 0700  . TPN ADULT (ION)       LOS: 23 days    Time spent: 25 minutes spent  in the coordination of care today.    Jonnie Finner, DO [facility] Pager [phone]  If 7PM-7AM, please contact night-coverage [email or web address] Password [email or web address] 01/30/2019, 1:43 PM

## 2019-01-31 ENCOUNTER — Encounter: Payer: Self-pay | Admitting: Gastroenterology

## 2019-01-31 LAB — COMPREHENSIVE METABOLIC PANEL
ALT: 16 U/L (ref 0–44)
AST: 46 U/L — ABNORMAL HIGH (ref 15–41)
Albumin: 4.3 g/dL (ref 3.5–5.0)
Alkaline Phosphatase: 77 U/L (ref 38–126)
Anion gap: 11 (ref 5–15)
BUN: 69 mg/dL — ABNORMAL HIGH (ref 8–23)
CO2: 30 mmol/L (ref 22–32)
Calcium: 9.2 mg/dL (ref 8.9–10.3)
Chloride: 102 mmol/L (ref 98–111)
Creatinine, Ser: 2.08 mg/dL — ABNORMAL HIGH (ref 0.44–1.00)
GFR calc Af Amer: 28 mL/min — ABNORMAL LOW (ref >60)
GFR calc non Af Amer: 24 mL/min — ABNORMAL LOW (ref >60)
Glucose, Bld: 101 mg/dL — ABNORMAL HIGH (ref 70–99)
Potassium: 4.7 mmol/L (ref 3.5–5.1)
Sodium: 143 mmol/L (ref 135–145)
Total Bilirubin: 1.7 mg/dL — ABNORMAL HIGH (ref 0.3–1.2)
Total Protein: 5.7 g/dL — ABNORMAL LOW (ref 6.5–8.1)

## 2019-01-31 LAB — DIFFERENTIAL
Abs Immature Granulocytes: 0.81 10*3/uL — ABNORMAL HIGH (ref 0.00–0.07)
Basophils Absolute: 0.1 10*3/uL (ref 0.0–0.1)
Basophils Relative: 1 %
Eosinophils Absolute: 0 10*3/uL (ref 0.0–0.5)
Eosinophils Relative: 0 %
Immature Granulocytes: 8 %
Lymphocytes Relative: 9 %
Lymphs Abs: 0.9 10*3/uL (ref 0.7–4.0)
Monocytes Absolute: 1.2 10*3/uL — ABNORMAL HIGH (ref 0.1–1.0)
Monocytes Relative: 11 %
Neutro Abs: 7.7 10*3/uL (ref 1.7–7.7)
Neutrophils Relative %: 71 %

## 2019-01-31 LAB — CBC
HCT: 32.6 % — ABNORMAL LOW (ref 36.0–46.0)
Hemoglobin: 10.1 g/dL — ABNORMAL LOW (ref 12.0–15.0)
MCH: 31.3 pg (ref 26.0–34.0)
MCHC: 31 g/dL (ref 30.0–36.0)
MCV: 100.9 fL — ABNORMAL HIGH (ref 80.0–100.0)
Platelets: 112 10*3/uL — ABNORMAL LOW (ref 150–400)
RBC: 3.23 MIL/uL — ABNORMAL LOW (ref 3.87–5.11)
RDW: 20.4 % — ABNORMAL HIGH (ref 11.5–15.5)
WBC: 10.8 10*3/uL — ABNORMAL HIGH (ref 4.0–10.5)
nRBC: 0.5 % — ABNORMAL HIGH (ref 0.0–0.2)

## 2019-01-31 LAB — MAGNESIUM: Magnesium: 1.8 mg/dL (ref 1.7–2.4)

## 2019-01-31 LAB — PREALBUMIN: Prealbumin: 10.9 mg/dL — ABNORMAL LOW (ref 18–38)

## 2019-01-31 LAB — GLUCOSE, CAPILLARY
Glucose-Capillary: 116 mg/dL — ABNORMAL HIGH (ref 70–99)
Glucose-Capillary: 124 mg/dL — ABNORMAL HIGH (ref 70–99)
Glucose-Capillary: 135 mg/dL — ABNORMAL HIGH (ref 70–99)

## 2019-01-31 LAB — PHOSPHORUS: Phosphorus: 3.3 mg/dL (ref 2.5–4.6)

## 2019-01-31 LAB — TRIGLYCERIDES: Triglycerides: 96 mg/dL (ref <150)

## 2019-01-31 MED ORDER — TRACE MINERALS CR-CU-MN-SE-ZN 10-1000-500-60 MCG/ML IV SOLN
INTRAVENOUS | Status: AC
  Administered 2019-01-31: 18:00:00 via INTRAVENOUS
  Filled 2019-01-31: qty 748.8

## 2019-01-31 MED ORDER — POTASSIUM CHLORIDE CRYS ER 20 MEQ PO TBCR
20.0000 meq | EXTENDED_RELEASE_TABLET | Freq: Two times a day (BID) | ORAL | Status: DC
  Administered 2019-02-01: 20 meq via ORAL
  Filled 2019-01-31 (×2): qty 1

## 2019-01-31 MED ORDER — INSULIN ASPART 100 UNIT/ML ~~LOC~~ SOLN
0.0000 [IU] | Freq: Three times a day (TID) | SUBCUTANEOUS | Status: DC
  Administered 2019-01-31 – 2019-02-01 (×2): 2 [IU] via SUBCUTANEOUS

## 2019-01-31 NOTE — Progress Notes (Signed)
Physical Therapy Treatment Patient Details Name: Kaylee Rasmussen MRN: [id number] DOB: [birth year] Today's Date: 01/31/2019    History of Present Illness 68 yo female admitted to ED 7/16 with rectal bleeding, CT abdomen pelvis revealing ? liver cirrhosis, ascites, small pleural effusions. Colonoscopy 7/19 reveals internal and external hemorrhoids, diverticulosis. 7/22 paracentesis yielding 4.5L clear yellow fluid. PMH includes HTN, HLD, asthma, RA, necrotizing glomerulonephritis/vasculitis, pancytopenia, chronic diarrhea with N/V.    PT Comments    Pt in bed with spouse at bed side.  Assisted OOB required increased time and + 2 for safety.  General bed mobility comments: required increased assist and use of bed pad to complete scooting to EOB.  Once upright, pt was able to static sit x 4 min at Supervision level. General transfer comment: assisted from elevated bed to [facility] then back to bed with MAX encouragement and 25% VC's on safety with turns, turn completion and hand transfers. General Gait Details: performed transfers only due to MAX c/o weakness and overall feeling "bad". Pt will need ST Rehab at SNF prior to returning home.  Follow Up Recommendations  SNF     Equipment Recommendations  None recommended by PT    Recommendations for Other Services       Precautions / Restrictions Precautions Precautions: Fall Restrictions Weight Bearing Restrictions: No    Mobility  Bed Mobility Overal bed mobility: Needs Assistance Bed Mobility: Supine to Sit;Sit to Supine     Supine to sit: Max assist Sit to supine: Max assist;Total assist   General bed mobility comments: required increased assist and use of bed pad to complete scooting to EOB.  Once upright, pt was able to static sit x 4 min at Supervision level.  Transfers Overall transfer level: Needs assistance Equipment used: Rolling walker (2 wheeled) Transfers: Sit to/from [facility] Sit to Stand: Min assist;Mod  assist;+2 safety/equipment Stand pivot transfers: Mod assist;Max assist;+2 safety/equipment       General transfer comment: assisted from elevated bed to [facility] then back to bed with MAX encouragement and 25% VC's on safety with turns, turn completion and hand transfers.  Ambulation/Gait             General Gait Details: performed transfers only due to MAX c/o weakness and overall feeling "bad".   Stairs             Wheelchair Mobility    Modified Rankin (Stroke Patients Only)       Balance                                            Cognition Arousal/Alertness: Awake/alert Behavior During Therapy: WFL for tasks assessed/performed Overall Cognitive Status: Within Functional Limits for tasks assessed                                 General Comments: AxO x 3 feels "bad" and stated "I ate too much"      Exercises      General Comments        Pertinent Vitals/Pain Pain Assessment: Faces Faces Pain Scale: Hurts little more Pain Location: abdomen R side Pain Descriptors / Indicators: Tender;Grimacing;Cramping Pain Intervention(s): Monitored during session    Home Living  Prior Function            PT Goals (current goals can now be found in the care plan section) Progress towards PT goals: Progressing toward goals    Frequency    Min 2X/week      PT Plan Current plan remains appropriate    Co-evaluation              AM-PAC PT "6 Clicks" Mobility   Outcome Measure  Help needed turning from your back to your side while in a flat bed without using bedrails?: A Lot Help needed moving from lying on your back to sitting on the side of a flat bed without using bedrails?: A Lot Help needed moving to and from a bed to a chair (including a wheelchair)?: A Lot Help needed standing up from a chair using your arms (e.g., wheelchair or bedside chair)?: A Lot Help needed to walk in hospital  room?: A Lot Help needed climbing 3-5 steps with a railing? : Total 6 Click Score: 11    End of Session Equipment Utilized During Treatment: Gait belt Activity Tolerance: Patient limited by fatigue;Patient limited by pain Patient left: in bed;with call bell/phone within reach;with bed alarm set;with family/visitor present Nurse Communication: Mobility status PT Visit Diagnosis: Other abnormalities of gait and mobility (R26.89);Muscle weakness (generalized) (M62.81);History of falling (Z91.81)     Time: 4827-0786 PT Time Calculation (min) (ACUTE ONLY): 29 min  Charges:    2 ta                    Rica Koyanagi  PTA Acute  Rehabilitation Services Pager      [phone] Office      [phone]

## 2019-01-31 NOTE — Progress Notes (Signed)
[facility] Progress Note  Subjective: c/o abd discomfort today - not a new finding.  No emesis.  Creat down further to 2.06, I/O's -400 mL yesterday (UOP 1800). Started TPN yesterday.  Palliative care consulted today but haven't seen pt yet.   Vitals:   01/30/19 1700 01/30/19 2140 01/31/19 0500 01/31/19 1347  BP: 138/80 (!) 143/85  126/78  Pulse: 82 97  78  Resp: _0 Temp: 98.2 F (36.8 C) 98.9 F (37.2 C)  98.8 F (37.1 C)  TempSrc: Oral Oral  Oral  SpO2: 95% 96%  100%  Weight:   75 kg   Height:        Inpatient medications: . sodium chloride   Intravenous Once  . budesonide  9 mg Oral Daily  . calcium carbonate  500 mg of elemental calcium Oral BID   And  . cholecalciferol  200 Units Oral BID  . dronabinol  2.5 mg Oral BID AC  . escitalopram  10 mg Oral Daily  . insulin aspart  0-15 Units Subcutaneous Q8H  . levothyroxine  88 mcg Oral Q0600  . magnesium oxide  400 mg Oral BID  . ondansetron  4 mg Intravenous Q8H  . pantoprazole  40 mg Oral BID  . potassium chloride  40 mEq Oral BID   . furosemide 120 mg (01/31/19 1301)  . TPN ADULT (ION) 85 mL/hr at 01/30/19 1744  . TPN ADULT (ION)     acetaminophen, albuterol, diclofenac sodium, loperamide, morphine injection, ondansetron, oxyCODONE, phenol, sodium chloride flush, sodium chloride flush    Exam: Gen alert, chron ill appearing No rash, cyanosis or gangrene Sclera anicteric, throat clear  No jvd or bruits Chest clear bilat except some dec'd BS L base RRR no MRG Abd soft ntnd 2-3+ ascites diffusely MS no joint effusions or deformity Ext 2+ bilat LE edema, no wounds or ulcer Neuro is alert, Ox 3 , nf , no asterixis    Home meds:  - amlodipine 2.5/ metoprolol xl 100 qd  - escitalopram 10 qd  - sod bicarb bid/ Kdur 10 qd/ proair hfa prn/ levothyroxine 88 qd  - prn's/ vitamins/ supplements   CT abd /pelvis 01/06/19 > 1. Abnormal appearance of the liver with heterogeneous hepatic  steatosis and heterogeneous liver enhancement. Questionable fine liver surface irregularity. Cannot exclude hepatic cirrhosis. Nodiscrete liver masses. Recommend correlation with liver enzymes. 2. Third-spacing of fluid with moderate ascites, small dependent bilateral pleural effusions, right greater than left, and mild anasarca. 3. No evidence of bowel obstruction or acute bowel inflammation. Mild colonic diverticulosis, without convincing findings of acute diverticulitis. 4. Patchy bandlike consolidation in the left lung base, cannot exclude pneumonia or aspiration.  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 EF 60% by echo.  Not hypotensive. Alb was 1.7 prior to supplement. On lasix 120 IV TID yesterday she was ~ net 400 negative.  Will continue this dose today in light of starting TPN but ultimately will need to move towards oral meds.  Will switch tomorrow if stable.  Creat continues to slowly improve (peak 2.7) down to 2.06 today. Mentation improved. No indication for RRT at this time.   CKD 3b -  baseline creat 1.5, eGFR 32, due to her known hx of ANCA+ GN treated 2019 (UA this admission bland)  Ascites/ cirrhosis w/ portal HTN - new diagnosis, possibly due to MTX vs fatty liver, GI is following.  No plans for liver biopsy at this time.   Vol overload/ anasarca/ hypoalbuminemia-  as above, avoid IVF's except D5W or D10 if needed for low BS  Malnutrition -plus liver disease contributing to hypoalbuminemia  Hypokalemia - resolved with KCl 40 BID supplement, K 4.7 today dec to 20 BID and if cont to receive TPN would prefer the supplement be rolled into that.   Dispo:  In light of overall status, comorbidities overall prognosis is guarded.  Per discussion with spouse has had general  global decline since around 05/2018 to point of not even being able to do many ADLs prior to admission ~1 mo ago.  I let him know that palliative care would be helping with the discussion re: goals of care and prognosis.  Although death does not seem imminent, it seems likely she will continue to have a downward trajectory and may have to be repeatedly hospitalized.   Jannifer Hick MD [facility] Pager [phone]   Iron/TIBC/Ferritin/ %Sat    Component Value Date/Time   IRON 81 01/07/2019 0928   TIBC NOT CALCULATED 01/07/2019 0928   FERRITIN 498 (H) 01/07/2019 0928   IRONPCTSAT NOT CALCULATED 01/07/2019 0928   Recent Labs  Lab 01/31/19 0500  NA 143  K 4.7  CL 102  CO2 30  GLUCOSE 101*  BUN 69*  CREATININE 2.08*  CALCIUM 9.2  PHOS 3.3  ALBUMIN 4.3   Recent Labs  Lab 01/31/19 0500  AST 46*  ALT 16  ALKPHOS 77  BILITOT 1.7*  PROT 5.7*   Recent Labs  Lab 01/31/19 0500  WBC 10.8*  HGB 10.1*  HCT 32.6*  PLT 112*

## 2019-01-31 NOTE — Progress Notes (Addendum)
Kaylee Rasmussen NOTE   Pharmacy Consult for TPN Indication: intolerance to enteral feeding (refusal to place NGT)   Patient Measurements: Height: 5\' 1"  (154.9 cm) Weight: 165 lb 5.5 oz (75 kg) IBW/kg (Calculated) : 47.8 TPN AdjBW (KG): 53.8 Body mass index is 31.24 kg/m. Usual Weight: ~70 kg  HPI: 68 yoF with PMH HTN, HLD, COPD, asthma, RA previously on immune modulators, h/o necrotizing glomerulonephritis/vasculitis (+ ANCA), and pancytopenia secondary to cyclophosphamide. She presented to the ED with complaints of chronic diarrhea, intermittent n/v, and some blood noted in stool. During 07/2018 admission in [place], EGD/colonoscopy showed an ulcerated mass-like lesion in the ileocecal valve with satellite ulcers. 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 New diagnosis of cirrhosis this admission with portal HTN and severe ascites.  EGD here still shows duodenal ulcer and distal esophagitis; very poor PO intake. Core tract feeding tube placed 8/3 but subsequently dislodged. Patient did not tolerate replacement of NGT and now refusing any further attempt at replacement. Pharmacy consulted to dose TPN. Per RD, considered at risk for refeeding  Central access: PICC placed 7/31 TPN start date: 8/6  Significant events:  8/8: phosphorus remains low, will not bolus with fluid overload, will increase in TPN, will not advance TPN rate today. Conisidering dialysis if intake, fluid volume not improved, Renal following. 8/10: concentrating TPN to 65 ml/hr, same total nutrition as with previous formulation at 85 ml/hr  ASSESSMENT                                                                                                           Today, 01/31/2019:  Glucose (CBG goal 100-150) CBGs well controlled (range 101-124)  5 units moderate SSI past 24 hr  Systemic absorption of Entocort is low (< 20%)  Electrolytes - all  WNL  Continues on KDur 40 mEq bid, MagOx 400 mg bid, NaBicarb 650 mg bid, Tums/Vit D bid  Renal - AoCKD d/t hepatorenal syndrome; SCr decreasing; bicarb WNL but rising; robust UOP with diuresis (continues on Lasix now 120 mg IV q8h)  LFTs - improved to WNL, Tbili remains elevated but improved; albumin WNL (iatrogenic d/t ascites treatment)  TGs - WNL  Prealbumin - low but improved (8/10)  Current Nutrition - salt-restricted diet, minimal intake  IVF - none d/t overload  NUTRITIONAL GOALS                                                                                             RD recs (8/6): Kcal:  2000-2200 kcal Protein:  100-115 grams Fluid:  >/= 2.5 L/day  PLAN  At 1800 today:  To minimize excess fluid administration, have concentrated TPN to provide roughly same nutrition at goal rate of 65 ml/hr  Concentrated TPN at goal rate of 65 ml/hr provides 112 g of protein and 2101 kcals, meeting 100% of patient needs  Will utilize concentrated (15%) amino acids d/t volume overload and ascites  Add MVI daily and trace elements MWF (due to national shortage)  Electrolytes: increase Mag, reduce K; no other changes; Cl:Ac = max Cl; stopping PO bicarb per Renal  IVF (none currently) per MD  Continue moderate SSI; reduce to q8 hr CBG checks  TPN lab panels on Mondays & Thursdays  Bmet, Mg, Phos tomorrow  Reuel Boom, PharmD, BCPS [phone] 01/31/2019, 8:21 AM

## 2019-01-31 NOTE — Progress Notes (Signed)
Patient ID: Kaylee Rasmussen, female   DOB: 1950/12/08, 68 y.o.   MRN: [id number]    Progress Note   Subjective  Day # 25 TPN day # 5 Creat 2.0 -continued improvement Pre-albumin 10.9 up from 5, albumin 4.3 Hemoglobin 10.1-stable status post transfusions 01/28/2019  Continues on IV Lasix and albumin daily  Patient says she feels about the same, did not sleep well.  She has been eating better the past couple of days, no further complaints of nausea.  Continues with 2-3 diarrheal stools per day, mild abdominal discomfort secondary to fullness.  States she has been coughing some, productive of colored sputum.   Objective   Vital signs in last 24 hours: Temp:  [98.2 F (36.8 C)-98.9 F (37.2 C)] 98.9 F (37.2 C) (08/09 2140) Pulse Rate:  [82-97] 97 (08/09 2140) Resp:  [16-20] 20 (08/09 2140) BP: (138-143)/(80-85) 143/85 (08/09 2140) SpO2:  [95 %-96 %] 96 % (08/09 2140) Weight:  [75 kg] 75 kg (08/10 0500) Last BM Date: 01/30/19 General: [name] in NAD Heart:  Regular rate and rhythm; no murmurs Lungs: Bibasilar rhonchi Abdomen:  Soft, nontender and nondistended. Normal bowel sounds.  1+ pitting edema in the lower abdominal wall and flanks Extremities:  Without edema. Neurologic:  Alert and oriented,  grossly normal neurologically. Psych:  Cooperative. Normal mood and affect.  Intake/Output from previous day: 08/09 0701 - 08/10 0700 In: 1400.3 [P.O.:537; I.V.:813.3; IV Piggyback:50] Out: 1800 [Urine:1800] Intake/Output this shift: Total I/O In: 120 [P.O.:120] Out: -   Lab Results: Recent Labs    01/29/19 0903 01/31/19 0500  WBC  --  10.8*  HGB 10.7* 10.1*  HCT 33.2* 32.6*  PLT  --  112*   BMET Recent Labs    01/29/19 0455 01/30/19 0629 01/31/19 0500  NA 144 147* 143  K 4.1 4.1 4.7  CL 108 106 102  CO2 28 30 30   GLUCOSE 121* 112* 101*  BUN 50* 64* 69*  CREATININE 2.30* 2.26* 2.08*  CALCIUM 9.0 9.4 9.2   LFT Recent Labs    01/31/19 0500  PROT  5.7*  ALBUMIN 4.3  AST 46*  ALT 16  ALKPHOS 77  BILITOT 1.7*   PT/INR No results for input(s): LABPROT, INR in the last 72 hours.      Assessment / Plan:    #1 severe protein calorie malnutrition, multifactorial, had been unable to take any significant p.o.'s for several weeks, unable to tolerate enteral tube feeds. TPN was started about 5 days ago. Appetite somewhat improved with addition of Marinol  Albumin and prealbumin improving  Continue current regimen  2 mild active nonspecific colitis on biopsies of recent colonoscopy, suspect underlying IBD.  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 daily, continue  #3 acute kidney injury on underlying chronic kidney disease-urine sodium have been less than 10, and she is being treated for HRS. Continues on IV Lasix and albumin. She has had significant gradual improvement and creatinine, prognosis remains guarded per nephrology  #4 anasarca-significant improvement since instituting albumin and Lasix #5 new diagnosis of cirrhosis, decompensated, etiology not clear.  All markers negative. Suspect secondary to fatty liver disease versus drug-induced with long history of methotrexate use for RA Liver biopsy has not been done but unlikely to change management  #6 rheumatoid arthritis #7 chronic anemia-distant with anemia of chronic disease, stable hemoglobin post transfusions  Hopefully can start participating in  physical therapy which will be key to getting her out of the hospital.     Principal Problem:   ARF (acute renal failure) (HCC) Active Problems:   Rheumatoid arthritis involving multiple sites with positive rheumatoid factor (HCC)   History of hypertension   History of hypothyroidism   Abnormal liver function   Diarrhea   Pulmonary nodule   Metabolic acidosis   Severe protein-calorie malnutrition (HCC)   Rectal bleeding   LFTs  abnormal   Cirrhosis (HCC)   Pancreatic insufficiency   Ascites of liver   AKI (acute kidney injury) (HCC)     LOS: 24 days   Aleli Navedo EsterwoodPA-C  01/31/2019, 11:01 AM

## 2019-01-31 NOTE — Progress Notes (Signed)
[name]  PROGRESS NOTE    Kaylee Rasmussen  QQP:[id number] DOB: 1950-07-08 DOA: 01/06/2019 PCP: Nicoletta Dress, MD   Brief Narrative:   68 y.o.female,hypertension, hyperlipidemia, Asthma, Rheumatoid arthritis (formerly on humira->CMV colitis, HSV esophagitis February 2020), h/o necrotizing glomerulonephritis/ vasculitis(+ ANCA), pancytopenia secondary to cyclophosphamide , has c/o chronic diarrhea and some intermittent n/v, and apparently noticed some blood in stool and told to come to ER for evaluation by GI. She was admitted for blood in stool and chronic diarrhea and abnormal liver function. She's now s/p colonoscopy with GI   Assessment & Plan:   Principal Problem:   ARF (acute renal failure) (HCC) Active Problems:   Rheumatoid arthritis involving multiple sites with positive rheumatoid factor (HCC)   History of hypertension   History of hypothyroidism   Abnormal liver function   Diarrhea   Pulmonary nodule   Metabolic acidosis   Severe protein-calorie malnutrition (HCC)   Rectal bleeding   LFTs abnormal   Cirrhosis (HCC)   Pancreatic insufficiency   Ascites of liver   AKI (acute kidney injury) ([place])   Blood in stool, CMV colitis - GI was consulted, appreciate recommendations - Colonoscopy was performed with findings of localized inflammation at ileocecal valve, external and internal hemorrhoids; see report -CMV found to be negative - No report of further bleeding noted currently  Abnormal liver function with hepatorenal syndrome - GI following - Earlier MRCP demonstrated severe diffuse hepatic steatosis (can't exclude cirrhosis) - solitary subcentimeter R liver lesion, likely benign cyst  - Unclear etiology, although had been on methotrexate - Large ascites and worsening renal function concerning for hepatorenal syndrome, see below - Pt is s/p large volume paracentesis yielding 4.5L on 7/22 - Liver US performed: 1. Hepatic  cirrhosis. Moderate volume ascites, most prominent in the right lower quadrant. 2. Minimal to and fro flow within the main portal and splenic veins. No discrete thrombosis appreciated. 3. Slow hepatopetal flow within the right and left portal veins. 4. Patent paraumbilical varix with hepatofugal flow.  Chronic diarrhea - gliadin antibodies - wnl, TTG - normal - Cholestyramine prn - Initially resolved with colestid BID, imodium, and creon - Colestid stopped 7/31 as it may contribute to nausea, also to get a better assessment of diarrhea - Entocort started 7/31  Community Acquired Pneumonia - Bld Cx Neg  - CXR with patchy airspace dz in L lung base (early pneumonia vs sequelae of aspiration) - Repeat CXR with small bilateral effusions and associated atelectasis vs consolidation - Completed course of Ceftriaxone/azithro x 5 days - Remains afebrile  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 - Concern for hepatorenal syndrome - Baseline Cr around 1.5 - SCr currently 2.26 - nephro onboard; IV albumin/lasix  Hypotension/Hx of Hypertension - Continue holding antihypertensives for now - BP remains stable - getting lasix  Non- AG acidosis - resolved  N/V - Zofran 4mg  iv q6h prn - Colestid recently stopped by GI as it may contribute to nausea - presently stable at this time - on marinol  Hypothyroidism - Cont Levothyroxine 88 micrograms po qday as tolerated  Anxiety - Cont Lexapro 10mg  po qday as pt tolerates - Currently stable  Normocytic Anemia - stable, no frank bleed noted - Labs reviewed. - Hgb improved after transfusion   RA  - Currently off Humira, follows with Deveshwar  - continue with Voltaren gel as needed  Pulmonary Nodule - Recommended outpatient follow up  Hypoglycemia - stable, monitor, encourage diet   Hypomagnesemia/Hypophospatemia - replete, monitor  Debility - PT recs SNF; Plan SNF when medically cleared   Toxic metabolic encephalopathy - resolved  Anasarca - BLE edema and signs of volume overload - 2d echo reviewed, normal LVEF - Given trial of lasix with some improvement. Lasix stopped given concerns of intravascular depletion and dehydration - Suspect anasarca related to marked malnutrition per above - GI recommendation for Cortrak, however pt pulled tube. OK to leave out for now per GI - Cont lasix with albumin per Nephrology  Severe protein calorie malnutrition - anorexia for several months noted - Likely contributing to anasarca above - Encourage PO intake. NG tube lost overnight - OK to leave out for today per GI - NGTwas replaced but she did not want it in - GI to start TPN; PICC in place -TPN started, Marinol started; monitor     - say appetite is picking up  I have consulted palliative care. Otherwise, continue as above.   DVT prophylaxis:SCDs Code Status:FULL Disposition Plan:To SNF when stable  Consultants:  GI  IR  Neprhology  Procedures:  Colonoscopy  EGD  ROS:  Denies CP, palpitations. Reports fatigue, frustration. Remainder 10-pt ROS is negative for all not previously mentioned.  Subjective: "Nothing is going right today."  Objective: Vitals:   01/30/19 0612 01/30/19 1700 01/30/19 2140 01/31/19 0500  BP: (!) 147/90 138/80 (!) 143/85   Pulse: 85 82 97   Resp: 16 16 20    Temp: 98.5 F (36.9 C) 98.2 F (36.8 C) 98.9 F (37.2 C)   TempSrc: Oral Oral Oral   SpO2: 97% 95% 96%   Weight: 73.8 kg   75 kg  Height:        Intake/Output Summary (Last 24 hours) at 01/31/2019 0714 Last data filed at 01/30/2019 2307 Gross per 24 hour  Intake 1400.29 ml  Output 1800 ml  Net -399.71 ml   Filed Weights   01/29/19 0539 01/30/19 0612 01/31/19 0500  Weight: 73.8 kg 73.8  kg 75 kg    Examination:  General:68 y.o.femaleresting in bed in NAD Eyes: PERRL, normal sclera ENMT: Nares patent w/o discharge, orophaynx clear, dentition normal, ears w/o discharge/lesions/ulcers Cardiovascular: RRR, +S1, S2, no m/g/r, equal pulses throughout Respiratory: CTABL, no w/r/r, normal WOB GI: BS+, NT, distended but soft, no masses noted, no organomegaly noted MSK:ble edema is greatly improved Skin: No rashes, bruises, ulcerations noted Neuro:alert to name, follows commands, no focal deficits Psyc: Appropriate interactionandaffect, calm/cooperative   Data Reviewed: I have personally reviewed following labs and imaging studies.  CBC: Recent Labs  Lab 01/25/19 0507 01/26/19 0446 01/27/19 0441 01/28/19 0305 01/29/19 0903 01/31/19 0500  WBC 7.7 8.0 7.2 7.4  --  10.8*  NEUTROABS  --   --  5.2 5.4  --  7.7  HGB 7.7* 7.0* 7.0* 6.7* 10.7* 10.1*  HCT 24.7* 21.9* 22.8* 21.5* 33.2* 32.6*  MCV 100.0 100.9* 101.3* 102.9*  --  100.9*  PLT 151 141* 136* 138*  --  767*   Basic Metabolic Panel: Recent Labs  Lab 01/25/19 1725 01/26/19 0446 01/27/19 0441 01/28/19 0305 01/29/19 0455 01/30/19 0629 01/31/19 0500  NA  --  144 144 145 144 147* 143  K  --  2.9* 3.4* 3.8 4.1 4.1 4.7  CL  --  108 109 109 108 106 102  CO2  --  26 26 27 28 30 30   GLUCOSE  --  81 95 128* 121* 112* 101*  BUN  --  56* 52* 50* 50* 64* 69*  CREATININE  --  2.74* 2.64* 2.54* 2.30* 2.26* 2.08*  CALCIUM  --  8.8* 8.7* 9.0 9.0 9.4 9.2  MG  --  1.9  --  1.9 1.8 1.9 1.8  PHOS 3.5  --   --  2.4* 2.2* 2.9 3.3   GFR: Estimated Creatinine Clearance: 24.3 mL/min (A) (by C-G formula based on SCr of 2.08 mg/dL (H)). Liver Function Tests: Recent Labs  Lab 01/25/19 0507 01/26/19 0446 01/28/19 0305 01/31/19 0500  AST 53* 37 32 46*  ALT 15 13 11 16   ALKPHOS 169* 119 88 77  BILITOT 2.4* 3.1* 2.4* 1.7*  PROT 5.3* 5.7* 6.0* 5.7*  ALBUMIN 3.0* 4.0 4.5 4.3   No results for input(s): LIPASE,  AMYLASE in the last 168 hours. No results for input(s): AMMONIA in the last 168 hours. Coagulation Profile: No results for input(s): INR, PROTIME in the last 168 hours. Cardiac Enzymes: No results for input(s): CKTOTAL, CKMB, CKMBINDEX, TROPONINI in the last 168 hours. BNP (last 3 results) No results for input(s): PROBNP in the last 8760 hours. HbA1C: No results for input(s): HGBA1C in the last 72 hours. CBG: Recent Labs  Lab 01/30/19 1145 01/30/19 1720 01/30/19 1948 01/31/19 0014 01/31/19 0527  GLUCAP 114* 152* 101* 124* 116*   Lipid Profile: Recent Labs    01/31/19 0500  TRIG 96   Thyroid Function Tests: No results for input(s): TSH, T4TOTAL, FREET4, T3FREE, THYROIDAB in the last 72 hours. Anemia Panel: No results for input(s): VITAMINB12, FOLATE, FERRITIN, TIBC, IRON, RETICCTPCT in the last 72 hours. Sepsis Labs: No results for input(s): PROCALCITON, LATICACIDVEN in the last 168 hours.  Recent Results (from the past 240 hour(s))  Body fluid culture     Status: None   Collection Time: 01/25/19 11:48 AM   Specimen: Peritoneal Washings; Body Fluid  Result Value Ref Range Status   Specimen Description   Final    PERITONEAL Performed at [facility], [place] [address] [place], [place] [address]    Special Requests   Final    NONE Performed at [facility], [place] [address], [place], [place] [address]    Gram Stain   Final    WBC PRESENT, PREDOMINANTLY MONONUCLEAR NO ORGANISMS SEEN CYTOSPIN SMEAR    Culture   Final    NO GROWTH 3 DAYS Performed at [facility], [place] [address] [place],  [address]    Report Status 01/28/2019 FINAL  Final      Radiology Studies: No results found.   Scheduled Meds: . sodium chloride   Intravenous Once  . budesonide  9 mg Oral Daily  . calcium carbonate  500 mg of elemental calcium Oral BID   And  . cholecalciferol  200 Units Oral BID  . dronabinol  2.5 mg Oral BID AC  . escitalopram  10  mg Oral Daily  . insulin aspart  0-15 Units Subcutaneous Q6H  . levothyroxine  88 mcg Oral Q0600  . magnesium oxide  400 mg Oral BID  . ondansetron  4 mg Intravenous Q8H  . pantoprazole  40 mg Oral BID  . potassium chloride  40 mEq Oral BID  . sodium bicarbonate  650 mg Oral BID   Continuous Infusions: . furosemide 120 mg (01/31/19 0612)  . TPN ADULT (ION) 85 mL/hr at 01/30/19 1744     LOS: 24 days    Time spent: 25 minutes spent in the coordination of care today.   Jonnie Finner, DO [facility] Pager [phone]  If 7PM-7AM, please contact night-coverage [email or web address] Password [email or web address] 01/31/2019, 7:14 AM

## 2019-01-31 NOTE — Progress Notes (Signed)
Palliative consult received.   Meeting planned for 8/11 at 22 with patient's spouse- Tarisha Fader.   Thank you for involving Palliative Medicine in this patient's care.   Mariana Kaufman, AGNP-C Palliative Medicine  Please call Palliative Medicine team phone with any questions [phone]. For individual providers please see AMION.

## 2019-02-01 DIAGNOSIS — Z7189 Other specified counseling: Secondary | ICD-10-CM

## 2019-02-01 DIAGNOSIS — Z515 Encounter for palliative care: Secondary | ICD-10-CM

## 2019-02-01 LAB — BASIC METABOLIC PANEL
Anion gap: 11 (ref 5–15)
BUN: 85 mg/dL — ABNORMAL HIGH (ref 8–23)
CO2: 32 mmol/L (ref 22–32)
Calcium: 9.5 mg/dL (ref 8.9–10.3)
Chloride: 101 mmol/L (ref 98–111)
Creatinine, Ser: 2.06 mg/dL — ABNORMAL HIGH (ref 0.44–1.00)
GFR calc Af Amer: 28 mL/min — ABNORMAL LOW (ref >60)
GFR calc non Af Amer: 24 mL/min — ABNORMAL LOW (ref >60)
Glucose, Bld: 103 mg/dL — ABNORMAL HIGH (ref 70–99)
Potassium: 4.7 mmol/L (ref 3.5–5.1)
Sodium: 144 mmol/L (ref 135–145)

## 2019-02-01 LAB — PHOSPHORUS: Phosphorus: 4.4 mg/dL (ref 2.5–4.6)

## 2019-02-01 LAB — MAGNESIUM: Magnesium: 2.2 mg/dL (ref 1.7–2.4)

## 2019-02-01 LAB — GLUCOSE, CAPILLARY
Glucose-Capillary: 110 mg/dL — ABNORMAL HIGH (ref 70–99)
Glucose-Capillary: 136 mg/dL — ABNORMAL HIGH (ref 70–99)
Glucose-Capillary: 142 mg/dL — ABNORMAL HIGH (ref 70–99)

## 2019-02-01 MED ORDER — STERILE WATER FOR INJECTION IV SOLN
INTRAVENOUS | Status: AC
  Administered 2019-02-01: 18:00:00 via INTRAVENOUS
  Filled 2019-02-01: qty 748.8

## 2019-02-01 MED ORDER — FUROSEMIDE 40 MG PO TABS
160.0000 mg | ORAL_TABLET | Freq: Two times a day (BID) | ORAL | Status: DC
  Administered 2019-02-01 – 2019-02-03 (×4): 160 mg via ORAL
  Filled 2019-02-01: qty 8
  Filled 2019-02-01 (×5): qty 4

## 2019-02-01 NOTE — Progress Notes (Signed)
[facility] Progress Note  Subjective: feeling a bit better today - abd discomfort not as pronounced.  Appetite seems a bit better - starting lunch of mashed potatoes, pot roast and corn currently.  Pall care meeting today - plan to f/u tomorrow.  Remains on TPN.   Vitals:   01/31/19 2150 02/01/19 0453 02/01/19 0456 02/01/19 0507  BP: 116/73 (!) 155/85  (!) 168/82  Pulse: 66 76  67  Resp: '18 20  15  '$ Temp: 98.6 F (37 C) 97.9 F (36.6 C)  98 F (36.7 C)  TempSrc: Oral Oral  Oral  SpO2: 99% 98%  98%  Weight:   73.8 kg   Height:        Inpatient medications: . sodium chloride   Intravenous Once  . budesonide  9 mg Oral Daily  . calcium carbonate  500 mg of elemental calcium Oral BID   And  . cholecalciferol  200 Units Oral BID  . dronabinol  2.5 mg Oral BID AC  . escitalopram  10 mg Oral Daily  . furosemide  160 mg Oral BID  . levothyroxine  88 mcg Oral Q0600  . magnesium oxide  400 mg Oral BID  . ondansetron  4 mg Intravenous Q8H  . pantoprazole  40 mg Oral BID  . potassium chloride  20 mEq Oral BID   . TPN ADULT (ION) 65 mL/hr at 02/01/19 0600  . TPN ADULT (ION)     acetaminophen, albuterol, diclofenac sodium, loperamide, morphine injection, ondansetron, oxyCODONE, phenol, sodium chloride flush, sodium chloride flush    Exam: Gen alert, chron ill appearing but a bit more energetic and engaged than yest No rash, cyanosis or gangrene Sclera anicteric, throat clear  No jvd or bruits Chest normal WOB, clear ant RRR no MRG Abd soft ntnd 2-3+ ascites diffusely MS no joint effusions or deformity Ext 2+ bilat LE edema, no wounds or ulcer Neuro is alert, Ox 3 , nf , no asterixis    Home meds:  - amlodipine 2.5/ metoprolol xl 100 qd  - escitalopram 10 qd  - sod bicarb bid/ Kdur 10 qd/ proair hfa prn/ levothyroxine 88 qd  - prn's/ vitamins/ supplements   CT abd /pelvis 01/06/19 > 1. Abnormal appearance of the liver with heterogeneous hepatic steatosis  and heterogeneous liver enhancement. Questionable fine liver surface irregularity. Cannot exclude hepatic cirrhosis. Nodiscrete liver masses. Recommend correlation with liver enzymes. 2. Third-spacing of fluid with moderate ascites, small dependent bilateral pleural effusions, right greater than left, and mild anasarca. 3. No evidence of bowel obstruction or acute bowel inflammation. Mild colonic diverticulosis, without convincing findings of acute diverticulitis. 4. Patchy bandlike consolidation in the left lung base, cannot exclude pneumonia or aspiration.  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 EF 60% by echo.  Not hypotensive. Alb was 1.7 prior to supplement. On lasix 120 IV TID yesterday she was ~ net 320 negative.  Change to oral lasix 160 BID today to see if this diuretic will allow maintenance of euvolemia.  Creat stable today at 2.06 from 2.08.   Mentation improved. No indication for RRT at this time.   CKD 3b - baseline creat 1.5, eGFR 32, due to  her known hx of ANCA+ GN treated 2019 (UA this admission bland)  Ascites/ cirrhosis w/ portal HTN - new diagnosis, possibly due to MTX vs fatty liver, GI is following.  No plans for liver biopsy at this time.   Vol overload/ anasarca/ hypoalbuminemia-  as above, avoid IVF's except D5W or D10 if needed for low BS  Malnutrition -plus liver disease contributing to hypoalbuminemia, currently on TPN and appetite stimulant.  Hopefully TPN is temporary.  Hypokalemia - halved po supplement yesterday and K 4.7 today, stop supplement.   Dispo:  In light of overall status, comorbidities overall prognosis is guarded.  Per discussion with spouse has had general global decline since around 05/2018 to point of not even being able to do many ADLs  prior to admission ~1 mo ago.  I'm happy that palliative care is involved as I suspect she's likely to have a continued global decline.    Kaylee Hick MD [facility] Pager [phone]   Iron/TIBC/Ferritin/ %Sat    Component Value Date/Time   IRON 81 01/07/2019 0928   TIBC NOT CALCULATED 01/07/2019 0928   FERRITIN 498 (H) 01/07/2019 0928   IRONPCTSAT NOT CALCULATED 01/07/2019 0928   Recent Labs  Lab 01/31/19 0500 02/01/19 0352  NA 143 144  K 4.7 4.7  CL 102 101  CO2 30 32  GLUCOSE 101* 103*  BUN 69* 85*  CREATININE 2.08* 2.06*  CALCIUM 9.2 9.5  PHOS 3.3 4.4  ALBUMIN 4.3  --    Recent Labs  Lab 01/31/19 0500  AST 46*  ALT 16  ALKPHOS 77  BILITOT 1.7*  PROT 5.7*   Recent Labs  Lab 01/31/19 0500  WBC 10.8*  HGB 10.1*  HCT 32.6*  PLT 112*

## 2019-02-01 NOTE — Plan of Care (Deleted)
Problem: Elimination: Goal: Will not experience complications related to bowel motility Outcome: Progressing but still needing the pure wick.

## 2019-02-01 NOTE — Consult Note (Signed)
Consultation Note Date: 02/01/2019   Patient Name: Kaylee Rasmussen  DOB: 1950/11/02  MRN: [id number]  Age / Sex: 68 y.o., female  PCP: Nicoletta Dress, MD Referring Physician: Jonnie Finner, DO  Reason for Consultation: Establishing goals of care  HPI/Patient Profile: 68 y.o. female  with past medical history of HTN, HLP, asthma, RA, CV colitis, HSV esophagitis, necrotizing glomerulonephritis, pancytopenia, chronic diarrhea admitted on 01/06/2019.  She has cirrhosis with hepatorenal syndrome.  Palliative consulted for goals of care.   Clinical Assessment and Goals of Care: I met today with Kaylee Rasmussen and her husband.   I introduced palliative care as specialized medical care for people living with serious illness. It focuses on providing relief from the symptoms and stress of a serious illness. The goal is to improve quality of life for both the patient and the family.  We discussed clinical course with continued decline in her functional status over the last several months followed by sharp decline this hospitalization.  Values and goals of care important to patient and family were attempted to be elicited.  She repots that her family and faith are most important things to her.  Her hope is for some stabilization of function to allow her to return to her home.  Conversation today was on introduction to advance care planning and education on likely decisions that they will need to face with her chronic illnesses and acute worsening.  Questions and concerns addressed.   PMT will continue to support holistically.  SUMMARY OF RECOMMENDATIONS   - Initial conversations today regarding palliative care and goals moving forward. - Her husband is her surrogate decision make - Continue full scope treatment.  We discussed that she has multisystem involvement and multiple chronic conditions that are not going to go  away.  Gentle conversations about poor long term prognosis.  Will continue to follow intermittently and progress conversation as patient emotionally able to do so.  Code Status/Advance Care Planning:  Full code  Palliative Prophylaxis:   Bowel Regimen and Frequent Pain Assessment  Additional Recommendations (Limitations, Scope, Preferences):  Full Scope Treatment  Psycho-social/Spiritual:   Desire for further Chaplaincy support:no  Additional Recommendations: Caregiving  Support/Resources  Prognosis:   Unable to determine  Discharge Planning: To Be Determined      Primary Diagnoses: Present on Admission: . ARF (acute renal failure) ([place]) . Abnormal liver function . Diarrhea . Pulmonary nodule . Metabolic acidosis . Rheumatoid arthritis involving multiple sites with positive rheumatoid factor ([place]) . Severe protein-calorie malnutrition ([place])   I have reviewed the medical record, interviewed the patient and family, and examined the patient. The following aspects are pertinent.  Past Medical History:  Diagnosis Date  . Asthma   . COPD (chronic obstructive pulmonary disease) ([place]) 07/13/2018   on CT chest at [facility]  . Esophagitis, CMV (cytomegalovirus) ([place]) 07/08/2018  . Herpes simplex esophagitis 07/08/2018  . High cholesterol   . Hypertension   . Pancytopenia ([place])    secondary to cyclophosphamide  . Rheumatoid arthritis (  [place])   . Severe protein-calorie malnutrition ([place]) 01/08/2019  . Vertigo    Social History   Socioeconomic History  . Marital status: Married    Spouse name: Not on file  . Number of children: Not on file  . Years of education: Not on file  . Highest education level: Not on file  Occupational History  . Not on file  Social Needs  . Financial resource strain: Not hard at all  . Food insecurity    Worry: Never true    Inability: Never true  . Transportation needs    Medical: No    Non-medical: No  Tobacco Use  . Smoking status:  Never Smoker  . Smokeless tobacco: Never Used  Substance and Sexual Activity  . Alcohol use: No  . Drug use: No  . Sexual activity: Not on file  Lifestyle  . Physical activity    Days per week: 0 days    Minutes per session: 0 min  . Stress: Not at all  Relationships  . Social Herbalist on phone: Three times a week    Gets together: Never    Attends religious service: Never    Active member of club or organization: No    Attends meetings of clubs or organizations: Never    Relationship status: Not on file  Other Topics Concern  . Not on file  Social History Narrative   Married   Lives in [place]   Never smoker, no EtoH/drugs   Family History  Problem Relation Age of Onset  . Diabetes Mother   . Heart attack Mother   . Heart attack Father   . Breast cancer Maternal Aunt   . Colon cancer Neg Hx   . Esophageal cancer Neg Hx    Scheduled Meds: . sodium chloride   Intravenous Once  . budesonide  9 mg Oral Daily  . calcium carbonate  500 mg of elemental calcium Oral BID   And  . cholecalciferol  200 Units Oral BID  . dronabinol  2.5 mg Oral BID AC  . escitalopram  10 mg Oral Daily  . furosemide  160 mg Oral BID  . levothyroxine  88 mcg Oral Q0600  . magnesium oxide  400 mg Oral BID  . ondansetron  4 mg Intravenous Q8H  . pantoprazole  40 mg Oral BID   Continuous Infusions: . TPN ADULT (ION) 65 mL/hr at 02/01/19 0600  . TPN ADULT (ION)     PRN Meds:.acetaminophen, albuterol, diclofenac sodium, loperamide, morphine injection, ondansetron, oxyCODONE, phenol, sodium chloride flush, sodium chloride flush Medications Prior to Admission:  Prior to Admission medications   Medication Sig Start Date End Date Taking? Authorizing Provider  amLODipine (NORVASC) 5 MG tablet Take 2.5 mg by mouth at bedtime.   Yes [provider]  Calcium Carbonate-Vitamin D (CALCIUM 600+D PO) Take 1 tablet by mouth 2 (two) times daily.   Yes [provider]   escitalopram (LEXAPRO) 10 MG tablet Take 10 mg by mouth daily. 03/23/17  Yes [provider]  levothyroxine (SYNTHROID, LEVOTHROID) 88 MCG tablet Take 88 mcg by mouth daily before breakfast.  03/12/17  Yes [provider]  metoprolol succinate (TOPROL-XL) 100 MG 24 hr tablet Take 100 mg by mouth daily. 03/24/17  Yes [provider]  potassium chloride (MICRO-K) 10 MEQ CR capsule Take 10 mEq by mouth daily.   Yes [provider]  PROAIR HFA 108 (90 Base) MCG/ACT inhaler Inhale 1-2 puffs into  the lungs every 4 (four) hours as needed for wheezing.  05/05/16  Yes [provider]  SODIUM BICARBONATE PO Take by mouth 2 (two) times daily. 10 gains twice  daily   Yes [provider]  colestipol (COLESTID) 1 g tablet Take 1 tablet (1 g total) by mouth 2 (two) times daily. 01/18/19 02/17/19  Cherylann Ratel A, DO  dicyclomine (BENTYL) 10 MG capsule Take 1 capsule (10 mg total) by mouth 2 (two) times a day. Patient not taking: Reported on 01/06/2019 12/17/18   Jackquline Denmark, MD  lactulose (CHRONULAC) 10 GM/15ML solution Take 15 mLs (10 g total) by mouth 3 (three) times daily. 01/18/19 02/17/19  Cherylann Ratel A, DO  lipase/protease/amylase (CREON) 36000 UNITS CPEP capsule Take 1 capsule (36,000 Units total) by mouth 3 (three) times daily before meals. 01/18/19 03/19/19  Cherylann Ratel A, DO  magnesium oxide (MAG-OX) 400 (241.3 Mg) MG tablet Take 1 tablet (400 mg total) by mouth 2 (two) times daily. 01/18/19 02/17/19  Cherylann Ratel A, DO  metoCLOPramide (REGLAN) 5 MG tablet Take 1 tablet (5 mg total) by mouth 2 (two) times a day for 14 days. half an hour before meals x 2 weeks for now 01/05/19 01/19/19  Jackquline Denmark, MD  ondansetron (ZOFRAN ODT) 4 MG disintegrating tablet Take 1 tablet (4 mg total) by mouth every 6 (six) hours as needed for nausea or vomiting. Patient not taking: Reported on 11/17/2018 10/28/18   Jackquline Denmark, MD  prochlorperazine (COMPAZINE) 10 MG tablet Take 1  tablet (10 mg total) by mouth 2 (two) times a day. Patient not taking: Reported on 01/06/2019 12/15/18   Jackquline Denmark, MD  sodium bicarbonate 650 MG tablet Take 1 tablet (650 mg total) by mouth 2 (two) times daily. 01/18/19 02/17/19  Cherylann Ratel A, DO   Allergies  Allergen Reactions  . Cozaar [Losartan Potassium]   . Scallops [Shellfish Allergy] Rash   Review of Systems  Constitutional: Positive for activity change and appetite change.  Gastrointestinal: Positive for abdominal distention.  Neurological: Positive for weakness.  Psychiatric/Behavioral: Positive for sleep disturbance.    Physical Exam General: Alert, awake, in no acute distress.  HEENT: No bruits, no goiter, no JVD Heart: Regular rate and rhythm. No murmur appreciated. Lungs: Good air movement, clear Abdomen: Soft, nontender, distended, positive bowel sounds.  Ext: Some edema Skin: Warm and dry Neuro: Grossly intact, nonfocal.   Vital Signs: BP 129/67 (BP Location: Left Arm)   Pulse 83   Temp 97.6 F (36.4 C) (Oral)   Resp 20   Ht '5\' 1"'$  (1.549 m)   Wt 73.8 kg   SpO2 97%   BMI 30.74 kg/m  Pain Scale: 0-10 POSS *See Group Information*: S-Acceptable,Sleep, easy to arouse Pain Score: 0-No pain   SpO2: SpO2: 97 % O2 Device:SpO2: 97 % O2 Flow Rate: .O2 Flow Rate (L/min): 2 L/min  IO: Intake/output summary:   Intake/Output Summary (Last 24 hours) at 02/01/2019 1412 Last data filed at 02/01/2019 1410 Gross per 24 hour  Intake 2950.87 ml  Output 2500 ml  Net 450.87 ml    LBM: Last BM Date: 01/30/19 Baseline Weight: Weight: 63.5 kg Most recent weight: Weight: 73.8 kg     Palliative Assessment/Data:   Flowsheet Rows     Most Recent Value  Intake Tab  Referral Department  Hospitalist  Unit at Time of Referral  Med/Surg Unit  Palliative Care Primary Diagnosis  Other (Comment) [Hepatic]  Date Notified  01/31/19  Palliative Care Type  New Palliative care  Reason for referral  Clarify Goals of Care   Date of Admission  01/06/19  Date first seen by Palliative Care  02/01/19  # of days Palliative referral response time  1 Day(s)  # of days IP prior to Palliative referral  25  Clinical Assessment  Palliative Performance Scale Score  40%  Pain Max last 24 hours  8  Pain Min Last 24 hours  5  Psychosocial & Spiritual Assessment  Palliative Care Outcomes  Patient/Family meeting held?  Yes  Who was at the meeting?  Patient, husband  Palliative Care Outcomes  Clarified goals of care      Time In: 1115 Time Out: 1230  Time Total: 75 Greater than 50%  of this time was spent counseling and coordinating care related to the above assessment and plan.  Signed by: Micheline Rough, MD   Please contact Palliative Medicine Team phone at [phone] for questions and concerns.  For individual provider: See Shea Evans

## 2019-02-01 NOTE — Progress Notes (Signed)
PHARMACY - ADULT TOTAL PARENTERAL NUTRITION CONSULT NOTE   Pharmacy Consult for TPN Indication: intolerance to enteral feeding (refusal to place NGT)   Patient Measurements: Height: 5\' 1"  (154.9 cm) Weight: 162 lb 11.2 oz (73.8 kg) IBW/kg (Calculated) : 47.8 TPN AdjBW (KG): 53.8 Body mass index is 30.74 kg/m. Usual Weight: ~70 kg  HPI: 34 yoF with PMH HTN, HLD, COPD, asthma, RA previously on immune modulators, h/o necrotizing glomerulonephritis/vasculitis (+ ANCA), and pancytopenia secondary to cyclophosphamide. She presented to the ED with complaints of chronic diarrhea, intermittent n/v, and some blood noted in stool. During 07/2018 admission in [place], EGD/colonoscopy showed an ulcerated mass-like lesion in the ileocecal valve with satellite ulcers. 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 New diagnosis of cirrhosis this admission with portal HTN and severe ascites.  EGD here still shows duodenal ulcer and distal esophagitis; very poor PO intake. Cortrak feeding tube placed 8/3 but subsequently dislodged. Patient did not tolerate replacement of NGT and now refusing any further attempt at replacement. Pharmacy consulted to dose TPN. Per RD, considered at risk for refeeding  Central access: PICC placed 7/31 TPN start date: 8/6  Significant events:  8/8: phosphorus remains low, will not bolus with fluid overload, will increase in TPN, will not advance TPN rate today. Conisidering dialysis if intake, fluid volume not improved, Renal following. 8/10: concentrating TPN to 65 ml/hr, same total nutrition as with previous formulation at 85 ml/hr  ASSESSMENT                                                                                                           Today, 02/01/2019:  Glucose (CBG goal 100-150) CBGs well controlled (range 110-136)  4 units moderate SSI past 24 hr  Systemic absorption of Entocort is low (< 20%) so expect minimal effect on  CBGs  Electrolytes - all WNL, phos rising  Renal - AoCKD d/t hepatorenal syndrome; SCr decreasing (baseline appears to be ~1.5 so not yet back to normal); bicarb WNL but rising; robust UOP with diuresis (continues on Lasix now 120 mg IV q8h)  LFTs - improved to WNL, Tbili remains elevated but improved; albumin WNL (iatrogenic d/t ascites treatment)  TGs - WNL  Prealbumin - low but improved (8/10)  Current Nutrition - salt-restricted diet, minimal intake (charted as 0-30% eaten over last few days)  IVF - none d/t overload  NUTRITIONAL GOALS                                                                                             RD recs (8/6): Kcal:  2000-2200 kcal Protein:  100-115 grams Fluid:  >/= 2.5 L/day  PLAN                                                                                                                          Continues on KDur, MagOx, and Tums/Vit D per MD; bicarb stopped  At 1800 today:  To minimize excess fluid administration, TPN has been concentrated to provide goal nutrition with minimal excess fluid at 65 ml/hr  Concentrated TPN at goal rate of 65 ml/hr provides 112 g of protein and 2066 kcals, meeting 100% of patient needs  Utilizing concentrated (15%) amino acids  Add MVI daily and trace elements MWF (due to national shortage)  Electrolytes: back off on Phos, Mag; no other changes; Cl:Ac = max Cl  IVF (none currently) per MD  Will discontinue CBG monitoring as patient stable with minimal insulin on goal rate TPN - monitor via SBGs  TPN lab panels on Mondays & Thursdays  Bmet, Mg, Phos tomorrow  Reuel Boom, PharmD, BCPS [phone] 02/01/2019, 7:23 AM

## 2019-02-01 NOTE — Progress Notes (Signed)
Physical Therapy Treatment Patient Details Name: Kaylee Rasmussen MRN: [id number] DOB: [birth year] Today's Date: 02/01/2019    History of Present Illness 68 yo female admitted to ED 7/16 with rectal bleeding, CT abdomen pelvis revealing ? liver cirrhosis, ascites, small pleural effusions. Colonoscopy 7/19 reveals internal and external hemorrhoids, diverticulosis. 7/22 paracentesis yielding 4.5L clear yellow fluid. PMH includes HTN, HLD, asthma, RA, necrotizing glomerulonephritis/vasculitis, pancytopenia, chronic diarrhea with N/V.    PT Comments    Spouse at bedside and very supportive. Assisted pt OOB.  General bed mobility comments: required increased assist and use of bed pad to complete scooting to EOB.  Once upright, pt was able to static sit at [facility] but urine urgency limited time so quickly assisted to [facility].  General transfer comment: assisted from elevated bed to [facility] then back to bed with MAX encouragement and 25% VC's on safety with turns, turn completion and hand transfers.  Pt progressing slowly.  Believe pt can do more that she allows.  Quick to say "I can't", "I hurt", "I feel sick".    Follow Up Recommendations  SNF     Equipment Recommendations  None recommended by PT    Recommendations for Other Services       Precautions / Restrictions Precautions Precautions: Fall Restrictions Weight Bearing Restrictions: No    Mobility  Bed Mobility Overal bed mobility: Needs Assistance Bed Mobility: Supine to Sit     Supine to sit: Max assist     General bed mobility comments: required increased assist and use of bed pad to complete scooting to EOB.  Once upright, pt was able to static sit at [facility] but urine urgency limited time so quickly assisted to [facility]  Transfers Overall transfer level: Needs assistance Equipment used: Rolling walker (2 wheeled) Transfers: Sit to/from [facility] Sit to Stand: Min assist;Mod assist;+2  safety/equipment Stand pivot transfers: Mod assist;Max assist;+2 safety/equipment       General transfer comment: assisted from elevated bed to [facility] then back to bed with MAX encouragement and 25% VC's on safety with turns, turn completion and hand transfers.  Ambulation/Gait             General Gait Details: performed transfers only due to MAX c/o weakness and overall fatigue   Stairs             Wheelchair Mobility    Modified Rankin (Stroke Patients Only)       Balance                                            Cognition Arousal/Alertness: Awake/alert Behavior During Therapy: WFL for tasks assessed/performed Overall Cognitive Status: Within Functional Limits for tasks assessed                                 General Comments: feels "okay"      Exercises      General Comments        Pertinent Vitals/Pain Pain Assessment: Faces Faces Pain Scale: Hurts little more Pain Location: abdomen R side Pain Descriptors / Indicators: Tender;Grimacing;Cramping Pain Intervention(s): Monitored during session;Repositioned    Home Living                      Prior Function  PT Goals (current goals can now be found in the care plan section) Progress towards PT goals: Progressing toward goals    Frequency           PT Plan Current plan remains appropriate    Co-evaluation              AM-PAC PT "6 Clicks" Mobility   Outcome Measure  Help needed turning from your back to your side while in a flat bed without using bedrails?: A Lot Help needed moving from lying on your back to sitting on the side of a flat bed without using bedrails?: A Lot Help needed moving to and from a bed to a chair (including a wheelchair)?: A Lot Help needed standing up from a chair using your arms (e.g., wheelchair or bedside chair)?: A Lot Help needed to walk in hospital room?: A Lot Help needed climbing 3-5 steps  with a railing? : Total 6 Click Score: 11    End of Session Equipment Utilized During Treatment: Gait belt Activity Tolerance: Patient limited by fatigue;Patient limited by pain Patient left: in chair;with call bell/phone within reach;with family/visitor present Nurse Communication: Mobility status PT Visit Diagnosis: Other abnormalities of gait and mobility (R26.89);Muscle weakness (generalized) (M62.81);History of falling (Z91.81)     Time: 1020-1046 PT Time Calculation (min) (ACUTE ONLY): 26 min  Charges:  $Therapeutic Activity: 23-37 mins                     Rica Koyanagi  PTA Acute  Rehabilitation Services Pager      [phone] Office      [phone]

## 2019-02-01 NOTE — Progress Notes (Addendum)
     [facility] Progress Note  CC:  Colitis, cirrhosis, malnutrition  Subjective: Day #26 TPN day #6  Feels about the same today.  Ate some breakfast this AM but her husband says that she was not able to eat much at all yesterday.  Says that she felt very full.  Unsure how many episodes of diarrhea.  Says that she has no sensation when she needs to go, is incontinent.  Diffuse abdominal discomfort from pressure/fullness.  Objective:  Vital signs in last 24 hours: Temp:  [97.9 F (36.6 C)-98.8 F (37.1 C)] 98 F (36.7 C) (08/11 0507) Pulse Rate:  [66-78] 67 (08/11 0507) Resp:  [15-20] 15 (08/11 0507) BP: (116-168)/(73-85) 168/82 (08/11 0507) SpO2:  [98 %-100 %] 98 % (08/11 0507) Weight:  [73.8 kg] 73.8 kg (08/11 0456) Last BM Date: 01/30/19 General:  Alert, Well-developed, in NAD Heart:  Regular rate and rhythm; no murmurs Pulm:  CTAB.  No increased WOB. Abdomen:  Soft, distended.  BS present.  Mild diffuse TTP.   Extremities:  Without edema. Neurologic:  Alert and  oriented x4;  grossly normal neurologically. Psych:  Alert and cooperative. Normal mood and affect.  Intake/Output from previous day: 08/10 0701 - 08/11 0700 In: 2480.1 [P.O.:360; I.V.:1712.9; IV Piggyback:407.1] Out: 2800 [Urine:2800]  Lab Results: Recent Labs    01/31/19 0500  WBC 10.8*  HGB 10.1*  HCT 32.6*  PLT 112*   BMET Recent Labs    01/30/19 0629 01/31/19 0500 02/01/19 0352  NA 147* 143 144  K 4.1 4.7 4.7  CL 106 102 101  CO2 30 30 32  GLUCOSE 112* 101* 103*  BUN 64* 69* 85*  CREATININE 2.26* 2.08* 2.06*  CALCIUM 9.4 9.2 9.5   LFT Recent Labs    01/31/19 0500  PROT 5.7*  ALBUMIN 4.3  AST 46*  ALT 16  ALKPHOS 77  BILITOT 1.7*   Assessment / Plan: #1 severe protein calorie malnutrition, multifactorial, had been unable to take any significant p.o.'s for several weeks, unable to tolerate enteral tube feeds. TPN was started about 6 days ago. Appetite somewhat  improved with addition of Marinol.  Albumin and prealbumin improving.  Continue current regimen.  #2 mild active non-specific colitis on biopsies of recent colonoscopy, suspect underlying IBD.  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  Improvement on Entocort 9 mg p.o. daily, continue.  #3 acute kidney injury on underlying chronic kidney disease-urine sodium have been less than 10, and she is being treated for HRS. Continues on IV Lasix and albumin. She has had significant gradual improvement in creatinine, prognosis remains guarded per nephrology.  #4 anasarca-significant improvement since instituting albumin and Lasix  #5 new diagnosis of cirrhosis, decompensated, etiology not clear.  All markers negative.  Suspect secondary to fatty liver disease versus drug-induced with long history of methotrexate use for RA. Liver biopsy has not been done but unlikely to change management.  Further paracentesis on hold due to renal function.  #6 rheumatoid arthritis  #7 chronic anemia-distant with anemia of chronic disease, stable hemoglobin post transfusions.  Hopefully can start participating in physical therapy which will be key to getting her out of the hospital.  Palliative care meeting in place for today.  No changes from GI standpoint.   LOS: 25 days   Laban Emperor. Trampus Mcquerry  02/01/2019, 9:07 AM

## 2019-02-01 NOTE — Progress Notes (Signed)
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 She was admitted for blood in stool and chronic diarrhea and abnormal liver function. She's now s/p colonoscopy with GI   Assessment & Plan:   Principal Problem:   ARF (acute renal failure) (HCC) Active Problems:   Rheumatoid arthritis involving multiple sites with positive rheumatoid factor (HCC)   History of hypertension   History of hypothyroidism   Abnormal liver function   Diarrhea   Pulmonary nodule   Metabolic acidosis   Severe protein-calorie malnutrition (HCC)   Rectal bleeding   LFTs abnormal   Cirrhosis (HCC)   Pancreatic insufficiency   Ascites of liver   AKI (acute kidney injury) ([place])   Blood in stool, CMV colitis - GI was consulted, appreciate recommendations - Colonoscopy was performed with findings of localized inflammation at ileocecal valve, external and internal hemorrhoids; see report -CMV found to be negative - No report of further bleeding noted currently  Abnormal liver function with hepatorenal syndrome - GI following - Earlier MRCP demonstrated severe diffuse hepatic steatosis (can't exclude cirrhosis) - solitary subcentimeter R liver lesion, likely benign cyst  - Unclear etiology, although had been on methotrexate - Large ascites and worsening renal function concerning for hepatorenal syndrome, see below - Pt is s/p large volume paracentesis yielding 4.5L on 7/22 - Liver US performed: 1. Hepatic  cirrhosis. Moderate volume ascites, most prominent in the right lower quadrant. 2. Minimal to and fro flow within the main portal and splenic veins. No discrete thrombosis appreciated. 3. Slow hepatopetal flow within the right and left portal veins. 4. Patent paraumbilical varix with hepatofugal flow.  Chronic diarrhea - gliadin antibodies - wnl, TTG - normal - Cholestyramine prn - Initially resolved with colestid BID, imodium, and creon - Colestid stopped 7/31 as it may contribute to nausea, also to get a better assessment of diarrhea - Entocort started 7/31  Community Acquired Pneumonia - Bld Cx Neg  - CXR with patchy airspace dz in L lung base (early pneumonia vs sequelae of aspiration) - Repeat CXR with small bilateral effusions and associated atelectasis vs consolidation - Completed course of Ceftriaxone/azithro x 5 days - Remains afebrile  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 - Concern for hepatorenal syndrome - Baseline Cr around 1.5 - SCr currently 2.06 - nephro onboard; IV lasix; transition to PO soon?  Hypotension/Hx of Hypertension - Continue holding antihypertensives for now - BP remains stable - getting lasix  Non- AG acidosis - resolved  N/V - Zofran 4mg  iv q6h prn - Colestid recently stopped by GI as it may contribute to nausea - presently stable at this time - on marinol; improved  Hypothyroidism - Cont Levothyroxine 88 micrograms po qday as tolerated  Anxiety - Cont Lexapro 10mg  po qday as pt tolerates - Currently stable  Normocytic Anemia - stable, no frank bleed noted - Labs reviewed. - Hgb improved after transfusion   RA  - Currently off Humira, follows with Deveshwar  - continue with Voltaren gel as needed  Pulmonary Nodule - Recommended outpatient follow up  Hypoglycemia - stable, monitor, encourage diet  Hypomagnesemia/Hypophospatemia - replete, monitor  Debility - PT recs SNF; Plan SNF when medically cleared   Toxic metabolic encephalopathy - resolved  Anasarca - BLE edema and signs of volume overload - 2d echo reviewed, normal LVEF - Given trial of lasix with some improvement. Lasix stopped given concerns of intravascular depletion and dehydration - Suspect anasarca related to marked malnutrition per above - GI recommendation for Cortrak, however pt pulled tube. OK to leave out for now per GI - Cont lasix per Nephrology     - greatly improved  Severe protein calorie malnutrition - anorexia for several months noted - Likely contributing to anasarca above - Encourage PO intake. NG tube lost overnight - OK to leave out for today per GI - NGTwas replaced but she did not want it in - GI to start TPN; PICC in place -TPN started, Marinol started; monitor - say appetite is picking up  Seems to be in good spirits this AM. PC meeting with family today. Not much change on our end. Continue as above.   DVT prophylaxis:SCDs Code Status:FULL Disposition Plan:To SNF when stable  Consultants:  GI  IR  Nephrology  Palliative Care  Procedures:  Colonoscopy  EGD  ROS:  Reports bloatedness. Denies CP, dyspnea, N/V. Remainder 10-pt ROS is negative for all not previously mentioned.  Subjective: "I feel a little 'blah'."  Objective: Vitals:   01/31/19 2150 02/01/19 0453 02/01/19 0456 02/01/19 0507  BP: 116/73 (!) 155/85  (!) 168/82  Pulse: 66 76  67  Resp: 18 20  15   Temp: 98.6 F (37 C) 97.9 F (36.6 C)  98 F (36.7 C)  TempSrc: Oral Oral  Oral  SpO2: 99% 98%  98%  Weight:   73.8 kg   Height:        Intake/Output Summary (Last 24 hours) at 02/01/2019 1221 Last data filed at 02/01/2019 1046 Gross per 24 hour  Intake 2480.05 ml  Output 3100 ml  Net -619.95 ml   Filed Weights    01/30/19 0612 01/31/19 0500 02/01/19 0456  Weight: 73.8 kg 75 kg 73.8 kg    Examination:  General:68 y.o.femaleresting in bed in NAD Eyes: PERRL, normal sclera ENMT: Nares patent w/o discharge, orophaynx clear, dentition normal, ears w/o discharge/lesions/ulcers Cardiovascular: RRR, +S1, S2, no m/g/r, equal pulses throughout Respiratory: CTABL, no w/r/r, normal WOB GI: BS+, NT, distended but soft, no masses noted, no organomegaly noted MSK:Ble edema minimal Skin: No rashes, bruises, ulcerations noted Neuro:alert to name, follows commands, no focal deficits Psyc: Appropriate interactionandaffect, calm/cooperative   Data Reviewed: I have personally reviewed following labs and imaging studies.  CBC: Recent Labs  Lab 01/26/19 0446 01/27/19 0441 01/28/19 0305 01/29/19 0903 01/31/19 0500  WBC 8.0 7.2 7.4  --  10.8*  NEUTROABS  --  5.2 5.4  --  7.7  HGB 7.0* 7.0* 6.7* 10.7* 10.1*  HCT 21.9* 22.8* 21.5* 33.2* 32.6*  MCV 100.9* 101.3* 102.9*  --  100.9*  PLT 141* 136* 138*  --  676*   Basic Metabolic Panel: Recent Labs  Lab 01/28/19 0305 01/29/19 0455 01/30/19 0629 01/31/19 0500 02/01/19 0352  NA 145 144 147* 143 144  K 3.8 4.1 4.1 4.7 4.7  CL 109 108 106 102 101  CO2 27 28 30 30  32  GLUCOSE 128* 121* 112* 101* 103*  BUN 50* 50* 64* 69* 85*  CREATININE 2.54* 2.30* 2.26* 2.08* 2.06*  CALCIUM 9.0 9.0 9.4 9.2 9.5  MG 1.9 1.8 1.9 1.8 2.2  PHOS  2.4* 2.2* 2.9 3.3 4.4   GFR: Estimated Creatinine Clearance: 24.3 mL/min (A) (by C-G formula based on SCr of 2.06 mg/dL (H)). Liver Function Tests: Recent Labs  Lab 01/26/19 0446 01/28/19 0305 01/31/19 0500  AST 37 32 46*  ALT 13 11 16   ALKPHOS 119 88 77  BILITOT 3.1* 2.4* 1.7*  PROT 5.7* 6.0* 5.7*  ALBUMIN 4.0 4.5 4.3   No results for input(s): LIPASE, AMYLASE in the last 168 hours. No results for input(s): AMMONIA in the last 168 hours. Coagulation Profile: No results for input(s): INR, PROTIME in the last  168 hours. Cardiac Enzymes: No results for input(s): CKTOTAL, CKMB, CKMBINDEX, TROPONINI in the last 168 hours. BNP (last 3 results) No results for input(s): PROBNP in the last 8760 hours. HbA1C: No results for input(s): HGBA1C in the last 72 hours. CBG: Recent Labs  Lab 01/31/19 0014 01/31/19 0527 01/31/19 1550 02/01/19 0044 02/01/19 0718  GLUCAP 124* 116* 135* 136* 110*   Lipid Profile: Recent Labs    01/31/19 0500  TRIG 96   Thyroid Function Tests: No results for input(s): TSH, T4TOTAL, FREET4, T3FREE, THYROIDAB in the last 72 hours. Anemia Panel: No results for input(s): VITAMINB12, FOLATE, FERRITIN, TIBC, IRON, RETICCTPCT in the last 72 hours. Sepsis Labs: No results for input(s): PROCALCITON, LATICACIDVEN in the last 168 hours.  Recent Results (from the past 240 hour(s))  Body fluid culture     Status: None   Collection Time: 01/25/19 11:48 AM   Specimen: Peritoneal Washings; Body Fluid  Result Value Ref Range Status   Specimen Description   Final    PERITONEAL Performed at [facility], [place] [address], [place], [place] [address]    Special Requests   Final    NONE Performed at [facility], [place] [address], [place], [place] [address]    Gram Stain   Final    WBC PRESENT, PREDOMINANTLY MONONUCLEAR NO ORGANISMS SEEN CYTOSPIN SMEAR    Culture   Final    NO GROWTH 3 DAYS Performed at [facility], [place] [address] [place], [place] [address]    Report Status 01/28/2019 FINAL  Final      Radiology Studies: No results found.   Scheduled Meds: . sodium chloride   Intravenous Once  . budesonide  9 mg Oral Daily  . calcium carbonate  500 mg of elemental calcium Oral BID   And  . cholecalciferol  200 Units Oral BID  . dronabinol  2.5 mg Oral BID AC  . escitalopram  10 mg Oral Daily  . levothyroxine  88 mcg Oral Q0600  . magnesium oxide  400 mg Oral BID  . ondansetron  4 mg Intravenous Q8H  . pantoprazole  40 mg Oral BID   . potassium chloride  20 mEq Oral BID   Continuous Infusions: . furosemide 62 mL/hr at 02/01/19 0600  . TPN ADULT (ION) 65 mL/hr at 02/01/19 0600  . TPN ADULT (ION)       LOS: 25 days    Time spent: 25 minutes spent in the coordination of care today.    Jonnie Finner, DO [facility] Pager [phone]  If 7PM-7AM, please contact night-coverage [email or web address] Password [email or web address] 02/01/2019, 12:21 PM

## 2019-02-01 NOTE — Progress Notes (Signed)
Offer to assist patient back to bed, patient stated she still wants to sit up.

## 2019-02-02 LAB — BASIC METABOLIC PANEL
Anion gap: 13 (ref 5–15)
BUN: 95 mg/dL — ABNORMAL HIGH (ref 8–23)
CO2: 32 mmol/L (ref 22–32)
Calcium: 9.5 mg/dL (ref 8.9–10.3)
Chloride: 99 mmol/L (ref 98–111)
Creatinine, Ser: 2.09 mg/dL — ABNORMAL HIGH (ref 0.44–1.00)
GFR calc Af Amer: 28 mL/min — ABNORMAL LOW (ref >60)
GFR calc non Af Amer: 24 mL/min — ABNORMAL LOW (ref >60)
Glucose, Bld: 83 mg/dL (ref 70–99)
Potassium: 4.5 mmol/L (ref 3.5–5.1)
Sodium: 144 mmol/L (ref 135–145)

## 2019-02-02 LAB — PHOSPHORUS: Phosphorus: 4.8 mg/dL — ABNORMAL HIGH (ref 2.5–4.6)

## 2019-02-02 LAB — MAGNESIUM: Magnesium: 2.8 mg/dL — ABNORMAL HIGH (ref 1.7–2.4)

## 2019-02-02 LAB — GLUCOSE, CAPILLARY: Glucose-Capillary: 112 mg/dL — ABNORMAL HIGH (ref 70–99)

## 2019-02-02 MED ORDER — TRACE MINERALS CR-CU-MN-SE-ZN 10-1000-500-60 MCG/ML IV SOLN
INTRAVENOUS | Status: AC
  Administered 2019-02-02: 18:00:00 via INTRAVENOUS
  Filled 2019-02-02: qty 672

## 2019-02-02 NOTE — Progress Notes (Signed)
Palliative Care Brief Note  Initial meeting with patient and her husband.   Discussed clinical course and advance care planning.  Patient and her husband appreciative of information, but desire to continue with current care plan at this time.  Full consult note to follow.  Micheline Rough, MD [place] Team [phone]

## 2019-02-02 NOTE — Progress Notes (Signed)
Physical Therapy Treatment Patient Details Name: Kaylee Rasmussen MRN: [id number] DOB: [birth year] Today's Date: 02/02/2019    History of Present Illness 68 yo female admitted to ED 7/16 with rectal bleeding, CT abdomen pelvis revealing ? liver cirrhosis, ascites, small pleural effusions. Colonoscopy 7/19 reveals internal and external hemorrhoids, diverticulosis. 7/22 paracentesis yielding 4.5L clear yellow fluid. PMH includes HTN, HLD, asthma, RA, necrotizing glomerulonephritis/vasculitis, pancytopenia, chronic diarrhea with N/V.    PT Comments    Pt looks better and appears more "Sprite".  Spouse in room and VERY helpful.  Assisted OOB.  General bed mobility comments: slightly "more able" to self assist but still required much assist mainly due to weakness and enlarged ABD girth.  General transfer comment: assisted from elevated bed to [facility] then back to bed with MAX encouragement and 25% VC's on safety with turns, turn completion and hand transfers. General Gait Details: + 2 side by side assist and spouse following with recliner, pt tolerated amb in hallway.  Very weak.  Limited distance.  50% assist to correctly maneuver walker around turns. Positioned in recliner with multiple pillows.    Follow Up Recommendations  SNF     Equipment Recommendations       Recommendations for Other Services       Precautions / Restrictions Precautions Precautions: Fall Restrictions Weight Bearing Restrictions: No    Mobility  Bed Mobility Overal bed mobility: Needs Assistance Bed Mobility: Supine to Sit     Supine to sit: Max assist;Mod assist     General bed mobility comments: slightly "more able" to self assist but still required much assist mainly due to weakness and enlarged ABD girth  Transfers Overall transfer level: Needs assistance Equipment used: Rolling walker (2 wheeled) Transfers: Sit to/from [facility] Sit to Stand: Min assist;Mod assist;+2  safety/equipment Stand pivot transfers: Mod assist;Max assist;+2 safety/equipment       General transfer comment: assisted from elevated bed to [facility] then back to bed with MAX encouragement and 25% VC's on safety with turns, turn completion and hand transfers.  Ambulation/Gait Ambulation/Gait assistance: Mod assist;+2 physical assistance;+2 safety/equipment Gait Distance (Feet): 28 Feet Assistive device: Rolling walker (2 wheeled) Gait Pattern/deviations: Step-through pattern;Decreased stride length Gait velocity: decreased   General Gait Details: + 2 side by side assist and spouse following with recliner, pt tolerated amb in hallway.  Very weak.  Limited distance.  50% assist to correctly maneuver walker around turns.   Stairs             Wheelchair Mobility    Modified Rankin (Stroke Patients Only)       Balance                                            Cognition Arousal/Alertness: Awake/alert Behavior During Therapy: WFL for tasks assessed/performed Overall Cognitive Status: Within Functional Limits for tasks assessed                                 General Comments: appears more "Sprite" had had less c/o's      Exercises      General Comments        Pertinent Vitals/Pain Pain Assessment: Faces Faces Pain Scale: Hurts a little bit Pain Location: abdomen R side Pain Descriptors / Indicators: Discomfort Pain Intervention(s): Monitored during session  Home Living                      Prior Function            PT Goals (current goals can now be found in the care plan section) Progress towards PT goals: Progressing toward goals    Frequency    Min 2X/week      PT Plan Current plan remains appropriate    Co-evaluation              AM-PAC PT "6 Clicks" Mobility   Outcome Measure  Help needed turning from your back to your side while in a flat bed without using bedrails?: A Lot Help needed  moving from lying on your back to sitting on the side of a flat bed without using bedrails?: A Lot Help needed moving to and from a bed to a chair (including a wheelchair)?: A Lot Help needed standing up from a chair using your arms (e.g., wheelchair or bedside chair)?: A Lot Help needed to walk in hospital room?: A Lot Help needed climbing 3-5 steps with a railing? : Total 6 Click Score: 11    End of Session Equipment Utilized During Treatment: Gait belt Activity Tolerance: Treatment limited secondary to medical complications (Comment) Patient left: in chair;with call bell/phone within reach;with family/visitor present Nurse Communication: Mobility status PT Visit Diagnosis: Other abnormalities of gait and mobility (R26.89);Muscle weakness (generalized) (M62.81);History of falling (Z91.81)     Time: 7619-5093 PT Time Calculation (min) (ACUTE ONLY): 27 min  Charges:  $Gait Training: 8-22 mins $Therapeutic Activity: 8-22 mins                     Rica Koyanagi  PTA Acute  Rehabilitation Services Pager      [phone] Office      [phone]

## 2019-02-02 NOTE — Progress Notes (Signed)
[place] NOTE   Pharmacy Consult for TPN Indication: intolerance to enteral feeding (refusal to place NGT)   Patient Measurements: Height: 5\' 1"  (154.9 cm) Weight: 165 lb 5.5 oz (75 kg) IBW/kg (Calculated) : 47.8 TPN AdjBW (KG): 53.8 Body mass index is 31.24 kg/m. Usual Weight: ~70 kg  HPI: 35 yoF with PMH HTN, HLD, COPD, asthma, RA previously on immune modulators, h/o necrotizing glomerulonephritis/vasculitis (+ ANCA), and pancytopenia secondary to cyclophosphamide. She presented to the ED with complaints of chronic diarrhea, intermittent n/v, and some blood noted in stool. During 07/2018 admission in [place], EGD/colonoscopy showed an ulcerated mass-like lesion in the ileocecal valve with satellite ulcers. 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 New diagnosis of cirrhosis this admission with portal HTN and severe ascites.  EGD here still shows duodenal ulcer and distal esophagitis; very poor PO intake. Cortrak feeding tube placed 8/3 but subsequently dislodged. Patient did not tolerate replacement of NGT and now refusing any further attempt at replacement. Pharmacy consulted to dose TPN. Per RD, considered at risk for refeeding  Central access: PICC placed 7/31 TPN start date: 8/6  Significant events:  8/8: phosphorus remains low, will not bolus with fluid overload, will increase in TPN, will not advance TPN rate today. Conisidering dialysis if intake, fluid volume not improved, Renal following. 8/10: concentrating TPN to 65 ml/hr, same total nutrition as with previous formulation at 85 ml/hr  ASSESSMENT                                                                                                           Today, 02/02/2019:  Glucose (CBG goal 100-150) no longer checking CBGs, SBG 83 this AM  Systemic absorption of Entocort is low (< 20%) so expect minimal effect on CBGs  Electrolytes - Mg, Phos now  elevated, others stable WNL  Renal - AoCKD d/t hepatorenal syndrome; SCr now stable but remains elevated (baseline appears to be ~1.5 so not yet back to normal); BUN elevated and rising; bicarb WNL but rising; robust UOP with diuresis (continues on Lasix now 160 mg PO bid)  LFTs - improved to WNL, Tbili remains elevated but improved; albumin WNL (iatrogenic d/t ascites treatment)  TGs - WNL  Prealbumin - low but improved (8/10)  Current Nutrition - salt-restricted diet, partial intake (charted as 0-50% eaten over last few days)  IVF - none d/t overload  NUTRITIONAL GOALS                                                                                             RD recs (8/6): Kcal:  2000-2200 kcal Protein:  100-115 grams  Fluid:  >/= 2.5 L/day  PLAN                                                                                                                          Continues on KDur, and Tums/Vit D per MD; bicarb and MagOx stopped  At 1800 today:  To minimize excess fluid administration, TPN has been concentrated to provide goal nutrition with minimal excess fluid at 60 ml/hr  With BUN rising will target lower end of protein goal but will not reduce below goal at this time given multiple confounding factors - difficult to tell if TPN is primary cause of elevated BUN  Concentrated TPN at goal rate of 60 ml/hr provides 100 g of protein and 2037 kcals, meeting 100% of patient needs  Utilizing concentrated (15%) amino acids  Add MVI daily and trace elements MWF (due to national shortage)  Electrolytes: remove Phos, Mg; no other changes; Cl:Ac = max Cl  Question if patient appropriate for PO nutritional supplementation?  IVF (none currently) per MD - avoiding d/t ascites/HRS  No longer checking routine CBGs, but will order CBG x1 tonight to ensure patient not hypoglycemic given low SBG this AM  TPN lab panels on Mondays & Thursdays  Reuel Boom, PharmD,  BCPS [phone] 02/02/2019, 7:45 AM

## 2019-02-02 NOTE — Progress Notes (Signed)
[facility] Progress Note  Subjective: Appetite improving, no new symptoms.  Await palliative care f/u visit today.  I/Os yesterday 1.4 / 1.2 after switching to oral lasix (did get am dose of IV).  Remains on TPN.   Vitals:   02/01/19 1326 02/01/19 2039 02/02/19 0544 02/02/19 0547  BP: 129/67 (!) 141/69 138/66   Pulse: 83 86 76   Resp: '20 16 20   '$ Temp: 97.6 F (36.4 C) 99.2 F (37.3 C) 98.8 F (37.1 C)   TempSrc: Oral Oral Oral   SpO2: 97% 96% 95%   Weight:    75 kg  Height:        Inpatient medications: . sodium chloride   Intravenous Once  . budesonide  9 mg Oral Daily  . calcium carbonate  500 mg of elemental calcium Oral BID   And  . cholecalciferol  200 Units Oral BID  . dronabinol  2.5 mg Oral BID AC  . escitalopram  10 mg Oral Daily  . furosemide  160 mg Oral BID  . levothyroxine  88 mcg Oral Q0600  . ondansetron  4 mg Intravenous Q8H  . pantoprazole  40 mg Oral BID   . TPN ADULT (ION) 65 mL/hr at 02/01/19 1731  . TPN ADULT (ION)     acetaminophen, albuterol, diclofenac sodium, loperamide, morphine injection, ondansetron, oxyCODONE, phenol, sodium chloride flush, sodium chloride flush    Exam: Gen alert, chron ill appearing but a bit more energetic and engaged than yest No rash, cyanosis or gangrene Sclera anicteric, throat clear  No jvd or bruits Chest normal WOB, clear ant RRR no MRG Abd soft ntnd, distended MS no joint effusions or deformity Ext 1+  LE edema, no wounds or ulcer Neuro is alert, Ox 3 , nf , no asterixis    Home meds:  - amlodipine 2.5/ metoprolol xl 100 qd  - escitalopram 10 qd  - sod bicarb bid/ Kdur 10 qd/ proair hfa prn/ levothyroxine 88 qd  - prn's/ vitamins/ supplements   CT abd /pelvis 01/06/19 > 1. Abnormal appearance of the liver with heterogeneous hepatic steatosis and heterogeneous liver enhancement. Questionable fine liver surface irregularity. Cannot exclude hepatic cirrhosis. Nodiscrete liver masses.  Recommend correlation with liver enzymes. 2. Third-spacing of fluid with moderate ascites, small dependent bilateral pleural effusions, right greater than left, and mild anasarca. 3. No evidence of bowel obstruction or acute bowel inflammation. Mild colonic diverticulosis, without convincing findings of acute diverticulitis. 4. Patchy bandlike consolidation in the left lung base, cannot exclude pneumonia or aspiration.  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 EF 60% by echo.  Not hypotensive. Alb was 1.7 prior to supplement. Cont oral lasix 160 BID today to see if this diuretic will allow maintenance of euvolemia; hopefully won't need long term TPN and can eventually use lower dosing.  Creat stable today at 2.1.   Mentation improved. No indication for RRT at this time.   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  No plans  for liver biopsy at this time.   Vol overload/ anasarca/ hypoalbuminemia-  as above, avoid IVF's except D5W or D10 if needed for low BS  Malnutrition -plus liver disease contributing to hypoalbuminemia, currently on TPN and appetite stimulant.  Hopefully TPN is temporary.  Hypokalemia - halved po supplement yesterday and K 4.7 today, stop supplement.   Dispo:  In light of overall status, comorbidities overall prognosis is guarded.  Per discussion with spouse has had general global decline since around 05/2018 to point of not even being able to do many ADLs prior to admission ~1 mo ago.  I'm happy that palliative care is involved as I suspect she's likely to have a continued global decline.     Jannifer Hick MD [facility] Pager [phone]   Iron/TIBC/Ferritin/ %Sat    Component Value Date/Time   IRON 81 01/07/2019 0928   TIBC NOT CALCULATED 01/07/2019 0928   FERRITIN 498 (H) 01/07/2019 0928   IRONPCTSAT NOT CALCULATED 01/07/2019 0928   Recent Labs  Lab 01/31/19 0500  02/02/19 0600  NA 143   < > 144  K 4.7   < > 4.5  CL 102   < > 99  CO2 30   < > 32  GLUCOSE 101*   < > 83  BUN 69*   < > 95*  CREATININE 2.08*   < > 2.09*  CALCIUM 9.2   < > 9.5  PHOS 3.3   < > 4.8*  ALBUMIN 4.3  --   --    < > = values in this interval not displayed.   Recent Labs  Lab 01/31/19 0500  AST 46*  ALT 16  ALKPHOS 77  BILITOT 1.7*  PROT 5.7*   Recent Labs  Lab 01/31/19 0500  WBC 10.8*  HGB 10.1*  HCT 32.6*  PLT 112*

## 2019-02-02 NOTE — Progress Notes (Signed)
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 She was admitted for blood in stool and chronic diarrhea and abnormal liver function. She's now s/p colonoscopy with GI  Assessment & Plan:   Principal Problem:   ARF (acute renal failure) (HCC) Active Problems:   Rheumatoid arthritis involving multiple sites with positive rheumatoid factor (HCC)   History of hypertension   History of hypothyroidism   Abnormal liver function   Diarrhea   Pulmonary nodule   Metabolic acidosis   Severe protein-calorie malnutrition (HCC)   Rectal bleeding   LFTs abnormal   Cirrhosis (HCC)   Pancreatic insufficiency   Ascites of liver   AKI (acute kidney injury) ([place])  Blood in stool, CMV colitis - GI was consulted, appreciate recommendations - Colonoscopy this admit was performed with findings of localized inflammation at ileocecal valve, external and internal hemorrhoids; see report -CMV found to be negative - No frank bleeding noted. Pt did receive 2 units PRBC tx on 8/7 for hgb of 6.7, see below  Abnormal liver function with hepatorenal syndrome - GI following - MRCP this admit demonstrated severe diffuse hepatic steatosis (can't exclude cirrhosis) - solitary subcentimeter R liver lesion, likely benign cyst  - Unclear etiology, although had been on methotrexate - Large ascites and worsening renal function concerning for hepatorenal syndrome, see below - Pt is s/p large volume paracentesis yielding  4.5L on 7/22 - Liver US performed: 1. Hepatic cirrhosis. Moderate volume ascites, most prominent in the right lower quadrant. 2. Minimal to and fro flow within the main portal and splenic veins. No discrete thrombosis appreciated. 3. Slow hepatopetal flow within the right and left portal veins. 4. Patent paraumbilical varix with hepatofugal flow.      - Initial plan for liver biopsy this admit, however this is no hold and is unlikely to change management. Further paracentesis remains on hold due to poor renal function  Chronic diarrhea - gliadin antibodies - wnl, TTG - normal - Initially resolved with colestid BID, imodium, and creon - Colestid stopped 7/31 as it may contribute to nausea, also to get a better assessment of diarrhea - Entocort started 7/31with improvement noted  Community Acquired Pneumonia - Bld Cx Neg  - Completed course of Ceftriaxone/azithro x 5 days - Remains afebrile  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 - Concern for hepatorenal syndrome - Baseline Cr around 1.5 - SCr currently 2.09 - Nephrology following. Pt is continued on '160mg'$  PO lasix BID  Hypotension/Hx of Hypertension - Continue holding antihypertensives for now - BP remains stable - getting lasix  Non- AG acidosis - resolved  N/V - Zofran '4mg'$  iv q6h prn - Colestid recently stopped by GI as it may contribute to nausea - presently stable at this time -on marinol; improved PO intake, however still quite limited  Hypothyroidism - Cont Levothyroxine 88 micrograms po qday as tolerated     - Stable at this time  Anxiety - Cont Lexapro '10mg'$  po qday as pt tolerates - Stable at this time  Normocytic Anemia - stable, no frank bleed reported - Hgb of 6.7 on 8/7, now s/p 2 units  of PRBC with appropriate correction     - Hgb today stable at 10.1   RA  - Currently off Humira, follows with Deveshwar   - continue with Voltaren gel as needed  Pulmonary Nodule - Outpatient follow up recommended when stable  Hypoglycemia - stable, monitor, continue to encourage diet  Hypomagnesemia/Hypophospatemia - continue to replace as needed  Debility - PT recs for SNF. Given global decline Palliative Care following     - Disposition uncertain at this time   Toxic metabolic encephalopathy - resolved  Anasarca - BLE edema and signs of volume overload - 2d echo reviewed, normal LVEF - Suspect anasarca related to marked malnutrition per above - GI initially recommended Cortrak, however pt has not tolerated enteric feeds     - Curerrently maintained on TPN per below .   - Edema improved with diuresis  Severe protein calorie malnutrition - anorexia for several months prior to admit noted - Likely contributing to anasarca above - Unable to tolerate enteric feeding, now with TPN -Weaning TPN per GI     - Pt is continued on marinol  End of Life - Appreciate input by Anthony for now is to continue current full scope of tx -Met with pt's husband at bedside today. Family is made aware of pt's guarded prognosis  DVT prophylaxis: SCD's Code Status: Full Family Communication: Pt in room, family at bedside Disposition Plan: Uncertain at this time  Consultants:   GI  Nephrology  Palliative Care  Procedures:   Colonoscopy 7/19  EGD 7/31  Antimicrobials: Anti-infectives (From admission, onward)   Start     Dose/Rate Route Frequency Ordered Stop   01/07/19 1700  cefTRIAXone (ROCEPHIN) 2 g in sodium chloride 0.9 % 100 mL IVPB     2 g 200 mL/hr over 30 Minutes Intravenous Every 24 hours 01/07/19 1557 01/11/19 1908   01/07/19 1700  azithromycin (ZITHROMAX) 500 mg in sodium chloride 0.9 % 250 mL IVPB     500 mg 250 mL/hr over 60 Minutes Intravenous Every 24 hours 01/07/19 1557 01/11/19 2014        Subjective: Flat affect, denies symptoms at this time. Reports still not tolerating much PO intake  Objective: Vitals:   02/01/19 2039 02/02/19 0544 02/02/19 0547 02/02/19 1407  BP: (!) 141/69 138/66  136/66  Pulse: 86 76  80  Resp: '16 20  14  '$ Temp: 99.2 F (37.3 C) 98.8 F (37.1 C)  98.1 F (36.7 C)  TempSrc: Oral Oral  Oral  SpO2: 96% 95%  94%  Weight:   75 kg   Height:        Intake/Output Summary (Last 24 hours) at 02/02/2019 1557 Last data filed at 02/02/2019 1410 Gross per 24 hour  Intake 1758.32 ml  Output 650 ml  Net 1108.32 ml   Filed Weights   01/31/19 0500 02/01/19 0456 02/02/19 0547  Weight: 75 kg 73.8 kg 75 kg    Examination:  General exam: Appears calm and comfortable  Respiratory system: Clear to auscultation. Respiratory effort normal. Cardiovascular system: S1 & S2 heard, RRR. Gastrointestinal system: Abdomen is nondistended, soft and nontender. No organomegaly or masses felt. Normal bowel sounds heard. Central nervous system: Alert and oriented. No focal neurological deficits. Extremities: Symmetric 5 x 5 power. Skin: No rashes, lesions  Psychiatry: Judgement and insight appear normal. Appears depressed, flat affect.   Data Reviewed: I have personally reviewed following labs and imaging studies  CBC: Recent Labs  Lab 01/27/19 0441  01/28/19 0305 01/29/19 0903 01/31/19 0500  WBC 7.2 7.4  --  10.8*  NEUTROABS 5.2 5.4  --  7.7  HGB 7.0* 6.7* 10.7* 10.1*  HCT 22.8* 21.5* 33.2* 32.6*  MCV 101.3* 102.9*  --  100.9*  PLT 136* 138*  --  371*   Basic Metabolic Panel: Recent Labs  Lab 01/29/19 0455 01/30/19 0629 01/31/19 0500 02/01/19 0352 02/02/19 0600  NA 144 147* 143 144 144  K 4.1 4.1 4.7 4.7 4.5  CL 108 106 102 101 99  CO2 '28 30 30 '$ 32 32  GLUCOSE 121* 112* 101* 103* 83  BUN 50* 64* 69* 85* 95*  CREATININE 2.30* 2.26* 2.08* 2.06* 2.09*  CALCIUM 9.0 9.4 9.2 9.5 9.5  MG 1.8 1.9 1.8 2.2 2.8*  PHOS 2.2* 2.9 3.3 4.4 4.8*   GFR:  Estimated Creatinine Clearance: 24.2 mL/min (A) (by C-G formula based on SCr of 2.09 mg/dL (H)). Liver Function Tests: Recent Labs  Lab 01/28/19 0305 01/31/19 0500  AST 32 46*  ALT 11 16  ALKPHOS 88 77  BILITOT 2.4* 1.7*  PROT 6.0* 5.7*  ALBUMIN 4.5 4.3   No results for input(s): LIPASE, AMYLASE in the last 168 hours. No results for input(s): AMMONIA in the last 168 hours. Coagulation Profile: No results for input(s): INR, PROTIME in the last 168 hours. Cardiac Enzymes: No results for input(s): CKTOTAL, CKMB, CKMBINDEX, TROPONINI in the last 168 hours. BNP (last 3 results) No results for input(s): PROBNP in the last 8760 hours. HbA1C: No results for input(s): HGBA1C in the last 72 hours. CBG: Recent Labs  Lab 01/31/19 0527 01/31/19 1550 02/01/19 0044 02/01/19 0718 02/01/19 1607  GLUCAP 116* 135* 136* 110* 142*   Lipid Profile: Recent Labs    01/31/19 0500  TRIG 96   Thyroid Function Tests: No results for input(s): TSH, T4TOTAL, FREET4, T3FREE, THYROIDAB in the last 72 hours. Anemia Panel: No results for input(s): VITAMINB12, FOLATE, FERRITIN, TIBC, IRON, RETICCTPCT in the last 72 hours. Sepsis Labs: No results for input(s): PROCALCITON, LATICACIDVEN in the last 168 hours.  Recent Results (from the past 240 hour(s))  Body fluid culture     Status: None   Collection Time: 01/25/19 11:48 AM   Specimen: Peritoneal Washings; Body Fluid  Result Value Ref Range Status   Specimen Description   Final    PERITONEAL Performed at [facility], [place] [address], [place], [place] [address]    Special Requests   Final    NONE Performed at [facility], [place] [address] [place], [place] [address]    Gram Stain   Final    WBC PRESENT, PREDOMINANTLY MONONUCLEAR NO ORGANISMS SEEN CYTOSPIN SMEAR    Culture   Final    NO GROWTH 3 DAYS Performed at [facility], [place] [address] [place], [place] [address]    Report Status 01/28/2019 FINAL   Final     Radiology Studies: No results found.  Scheduled Meds: . sodium chloride   Intravenous Once  . budesonide  9 mg Oral Daily  . calcium carbonate  500 mg of elemental calcium Oral BID   And  . cholecalciferol  200 Units Oral BID  . dronabinol  2.5 mg Oral BID AC  . escitalopram  10 mg Oral Daily  . furosemide  160 mg Oral BID  . levothyroxine  88 mcg Oral Q0600  . ondansetron  4 mg Intravenous Q8H  . pantoprazole  40 mg Oral BID  Continuous Infusions: . TPN ADULT (ION) 65 mL/hr at 02/02/19 1400  . TPN ADULT (ION)       LOS: 26 days   Marylu Lund, MD [facility] Pager On Amion  If 7PM-7AM, please contact night-coverage 02/02/2019, 3:57 PM

## 2019-02-03 LAB — COMPREHENSIVE METABOLIC PANEL
ALT: 57 U/L — ABNORMAL HIGH (ref 0–44)
AST: 106 U/L — ABNORMAL HIGH (ref 15–41)
Albumin: 3.8 g/dL (ref 3.5–5.0)
Alkaline Phosphatase: 95 U/L (ref 38–126)
Anion gap: 12 (ref 5–15)
BUN: 108 mg/dL — ABNORMAL HIGH (ref 8–23)
CO2: 31 mmol/L (ref 22–32)
Calcium: 9.3 mg/dL (ref 8.9–10.3)
Chloride: 101 mmol/L (ref 98–111)
Creatinine, Ser: 1.99 mg/dL — ABNORMAL HIGH (ref 0.44–1.00)
GFR calc Af Amer: 29 mL/min — ABNORMAL LOW (ref >60)
GFR calc non Af Amer: 25 mL/min — ABNORMAL LOW (ref >60)
Glucose, Bld: 104 mg/dL — ABNORMAL HIGH (ref 70–99)
Potassium: 3.7 mmol/L (ref 3.5–5.1)
Sodium: 144 mmol/L (ref 135–145)
Total Bilirubin: 1.3 mg/dL — ABNORMAL HIGH (ref 0.3–1.2)
Total Protein: 5.8 g/dL — ABNORMAL LOW (ref 6.5–8.1)

## 2019-02-03 LAB — PHOSPHORUS: Phosphorus: 3.6 mg/dL (ref 2.5–4.6)

## 2019-02-03 LAB — MAGNESIUM: Magnesium: 2.5 mg/dL — ABNORMAL HIGH (ref 1.7–2.4)

## 2019-02-03 MED ORDER — HYDROMORPHONE HCL 1 MG/ML IJ SOLN
0.5000 mg | INTRAMUSCULAR | Status: DC | PRN
  Administered 2019-02-06 – 2019-02-07 (×3): 0.5 mg via INTRAVENOUS
  Filled 2019-02-03 (×3): qty 0.5

## 2019-02-03 MED ORDER — STERILE WATER FOR INJECTION IV SOLN
INTRAVENOUS | Status: AC
  Administered 2019-02-03: 17:00:00 via INTRAVENOUS
  Filled 2019-02-03: qty 448

## 2019-02-03 MED ORDER — POTASSIUM CHLORIDE CRYS ER 20 MEQ PO TBCR
20.0000 meq | EXTENDED_RELEASE_TABLET | Freq: Once | ORAL | Status: AC
  Administered 2019-02-03: 20 meq via ORAL
  Filled 2019-02-03: qty 1

## 2019-02-03 MED ORDER — FUROSEMIDE 40 MG PO TABS
120.0000 mg | ORAL_TABLET | Freq: Two times a day (BID) | ORAL | Status: DC
  Administered 2019-02-03: 120 mg via ORAL
  Filled 2019-02-03: qty 3

## 2019-02-03 NOTE — Progress Notes (Signed)
     [facility] Progress Note  CC:  Colitis, cirrhosis, malnutrition  Subjective:   Day #28 TPN day #8  Feels about the same.  Walking small amounts with PT.  Some meals has not appetite at all, did not eat anything for breakfast.  No abdominal pain.  Is very tired.  Per nursing staff, no BM today or yesterday.  Objective:  Vital signs in last 24 hours: Temp:  [97.9 F (36.6 C)-98.1 F (36.7 C)] 98.1 F (36.7 C) (08/13 0648) Pulse Rate:  [80-83] 82 (08/13 0648) Resp:  [14-16] 16 (08/13 0648) BP: (132-137)/(58-68) 132/58 (08/13 0648) SpO2:  [92 %-94 %] 92 % (08/13 0648) Weight:  [71 kg] 71 kg (08/13 0500) Last BM Date: 01/30/19 General:  Alert, chronically ill-appearing, in NAD Heart:  Regular rate and rhythm; no murmurs Pulm:  CTAB.  No increased WOB. Abdomen:  Soft, slightly distended.  BS present.  Non-tender. Extremities:  Without edema. Neurologic:  Alert and oriented x 4; grossly normal neurologically.  Intake/Output from previous day: 08/12 0701 - 08/13 0700 In: 2150.6 [P.O.:430; I.V.:1720.6] Out: 3050 [Urine:3050] Intake/Output this shift: Total I/O In: 210 [P.O.:60; I.V.:150] Out: 300 [Urine:300]  Lab Results:  BMET Recent Labs    02/01/19 0352 02/02/19 0600 02/03/19 0326  NA 144 144 144  K 4.7 4.5 3.7  CL 101 99 101  CO2 32 32 31  GLUCOSE 103* 83 104*  BUN 85* 95* 108*  CREATININE 2.06* 2.09* 1.99*  CALCIUM 9.5 9.5 9.3   LFT Recent Labs    02/03/19 0326  PROT 5.8*  ALBUMIN 3.8  AST 106*  ALT 57*  ALKPHOS 95  BILITOT 1.3*   Assessment / Plan: #1 severe protein calorie malnutrition, multifactorial, had been unable to take any significant p.o.'s for several weeks, unable to tolerate enteral tube feeds. TPN was started about 8 days ago. Appetite somewhat improved with addition of Marinol, but still not eating at all for some meals.  Albumin and prealbumin improving.  Continue current regimen.  #2 mild active  non-specific colitis on biopsies of recent colonoscopy, suspect underlying IBD. 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  Improvement on Entocort 9 mg p.o. daily, continue for now.  No BM now in 2 days actually.  #3 acute kidney injury on underlying chronic kidney disease-urine sodium have been less than 10, and she is being treated for HRS. Continues on IV Lasix and albumin. She has had significant gradual improvement in creatinine although not much change over the past couple of days, prognosis remains guarded per nephrology.  #4 anasarca-significant improvement since instituting albumin and Lasix  #5 new diagnosis of cirrhosis, decompensated, etiology not clear. All markers negative.  Suspect secondary to fatty liver disease versus drug-induced with long history of methotrexate use for RA. Liver biopsy has not been done but unlikely to change management.  Further paracentesis on hold due to renal function.  Slight increase in LFT's today may very well just be from the TNA.  #6 rheumatoid arthritis  #7 chronic anemia-distant with anemia of chronic disease, stable hemoglobin post transfusions.  Palliative care team is seeing the patient although family wants full care for now.  No changes from GI standpoint.  GI is going to sign off for now.  Please call back if any other specific questions.   LOS: 27 days   Laban Emperor. Alfonsa Vaile  02/03/2019, 9:35 AM

## 2019-02-03 NOTE — Progress Notes (Signed)
Daily Progress Note   Patient Name: Kaylee Rasmussen       Date: 02/03/2019 DOB: [birth year]  Age: 68 y.o. MRN#: [id number] Attending Physician: Donne Hazel, MD Primary Care Physician: Nicoletta Dress, MD Admit Date: 01/06/2019  Reason for Consultation/Follow-up: Establishing goals of care  Subjective: Discussed with Dr. Wyline Copas.  Multiple providers have discussed poor long term prognosis with patient and her husband but he continues to ask same question about if she is going to get better to everyone who sees her.  He is certainly appropriate during conversation, but remains hopeful that she will improve despite poor long term prognosis.  Planned to discuss eventual goal of transition to SNF once she is weaned from TPN and that completion of MOST form would be benficial, however, she was having increased pain and not feeling well and did not wish to discuss today.  Her pain is sharp, then dull, in lower abdomen with partial relief from pain medication.  No exacerbating factors she has identified.  Had been having diarrhea which has improved.  Length of Stay: 27  Current Medications: Scheduled Meds:  . sodium chloride   Intravenous Once  . budesonide  9 mg Oral Daily  . calcium carbonate  500 mg of elemental calcium Oral BID   And  . cholecalciferol  200 Units Oral BID  . dronabinol  2.5 mg Oral BID AC  . escitalopram  10 mg Oral Daily  . furosemide  120 mg Oral BID  . levothyroxine  88 mcg Oral Q0600  . ondansetron  4 mg Intravenous Q8H  . pantoprazole  40 mg Oral BID    Continuous Infusions: . TPN ADULT (ION) 40 mL/hr at 02/03/19 1715    PRN Meds: acetaminophen, albuterol, diclofenac sodium, HYDROmorphone (DILAUDID) injection, loperamide, ondansetron, oxyCODONE, phenol, sodium  chloride flush, sodium chloride flush  Physical Exam         General: Alert, awake, in no acute distress.  HEENT: No bruits, no goiter, no JVD Heart: Regular rate and rhythm. No murmur appreciated. Lungs: Good air movement, clear Abdomen: Soft, nontender, distended, positive bowel sounds.  Ext: Some edema Skin: Warm and dry Neuro: Grossly intact, nonfocal.  Vital Signs: BP (!) 149/79 (BP Location: Left Arm)   Pulse 85   Temp 98.7 F (37.1 C) (Oral)  Resp 18   Ht 5\' 1"  (1.549 m)   Wt 71 kg   SpO2 95%   BMI 29.58 kg/m  SpO2: SpO2: 95 % O2 Device: O2 Device: Room Air O2 Flow Rate: O2 Flow Rate (L/min): 2 L/min  Intake/output summary:   Intake/Output Summary (Last 24 hours) at 02/03/2019 2052 Last data filed at 02/03/2019 1753 Gross per 24 hour  Intake 2241.78 ml  Output 3950 ml  Net -1708.22 ml   LBM: Last BM Date: 01/30/19 Baseline Weight: Weight: 63.5 kg Most recent weight: Weight: 71 kg       Palliative Assessment/Data:    Flowsheet Rows     Most Recent Value  Intake Tab  Referral Department  Hospitalist  Unit at Time of Referral  Med/Surg Unit  Palliative Care Primary Diagnosis  Other (Comment) [Hepatic]  Date Notified  01/31/19  Palliative Care Type  New Palliative care  Reason for referral  Clarify Goals of Care  Date of Admission  01/06/19  Date first seen by Palliative Care  02/01/19  # of days Palliative referral response time  1 Day(s)  # of days IP prior to Palliative referral  25  Clinical Assessment  Palliative Performance Scale Score  40%  Pain Max last 24 hours  8  Pain Min Last 24 hours  5  Psychosocial & Spiritual Assessment  Palliative Care Outcomes  Patient/Family meeting held?  Yes  Who was at the meeting?  Patient, husband  Palliative Care Outcomes  Clarified goals of care      Patient Active Problem List   Diagnosis Date Noted  . AKI (acute kidney injury) ([place])   . Ascites of liver   . LFTs abnormal   . Cirrhosis ([place])   .  Pancreatic insufficiency   . Rectal bleeding   . Severe protein-calorie malnutrition ([place]) 01/08/2019  . Abnormal liver function 01/07/2019  . Diarrhea 01/07/2019  . Pulmonary nodule 01/07/2019  . Metabolic acidosis 68/34/1962  . ARF (acute renal failure) ([place]) 01/06/2019  . Rheumatoid arthritis involving multiple sites with positive rheumatoid factor ([place]) 08/27/2017  . High risk medication use 08/27/2017  . Primary osteoarthritis of both knees 08/27/2017  . History of total hip replacement, right 08/27/2017  . Renal calcinosis 08/27/2017  . History of hypertension 08/27/2017  . History of hypothyroidism 08/27/2017  . History of hypercholesterolemia 08/27/2017    Palliative Care Assessment & Plan   Patient Profile: 68 y.o. female  with past medical history of HTN, HLP, asthma, RA, CV colitis, HSV esophagitis, necrotizing glomerulonephritis, pancytopenia, chronic diarrhea admitted on 01/06/2019.  She has cirrhosis with hepatorenal syndrome.  Palliative consulted for goals of care.   Recommendations/Plan:  Pain: She looks much more uncomfortable than the last 2 days.  Chart reviewed and she has not really been getting any regular pain medications.  She received oxycodone approximately 40 minutes prior to my seeing her and reports that pain is starting to improve.  Continue oxycodone as needed with second line hydromorphone as needed (changed from morphine due to renal function).  Goals of care: Discussed with Dr. Wyline Copas and would benefit from MOST form prior to discharge.  Patient feeling too poorly today to discuss.  Goals of Care and Additional Recommendations:  Limitations on Scope of Treatment: Full Scope Treatment  Code Status:    Code Status Orders  (From admission, onward)         Start     Ordered   01/06/19 2208  Full code  Continuous  01/06/19 2209        Code Status History    This patient has a current code status but no historical code status.   Advance  Care Planning Activity    Advance Directive Documentation     Most Recent Value  Type of Advance Directive  Living will  Pre-existing out of facility DNR order (yellow form or pink MOST form)  -  "MOST" Form in Place?  -       Prognosis:   Unable to determine  Discharge Planning:  [place] for rehab with Palliative care service follow-up most likely  Care plan was discussed with patient, husband  Thank you for allowing the Palliative Medicine Team to assist in the care of this patient.   Time In: 1700 Time Out: 1740 Total Time 40 Prolonged Time Billed No      Greater than 50%  of this time was spent counseling and coordinating care related to the above assessment and plan.  Micheline Rough, MD  Please contact Palliative Medicine Team phone at [phone] for questions and concerns.

## 2019-02-03 NOTE — Progress Notes (Signed)
PROGRESS NOTE    Kaylee Rasmussen  ZOX:[id number] DOB: Nov 04, 1950 DOA: 01/06/2019 PCP: Nicoletta Dress, MD    Brief Narrative:  68 y.o.female,hypertension, hyperlipidemia, Asthma, Rheumatoid arthritis (formerly on humira->CMV colitis, HSV esophagitis February 2020), h/o necrotizing glomerulonephritis/ vasculitis(+ ANCA), pancytopenia secondary to cyclophosphamide , has c/o chronic diarrhea and some intermittent n/v, and apparently noticed some blood in stool and told to come to ER for evaluation by GI. She was admitted for blood in stool and chronic diarrhea and abnormal liver function. She's now s/p colonoscopy with GI  Assessment & Plan:   Principal Problem:   ARF (acute renal failure) ([facility]) Active Problems:   Rheumatoid arthritis involving multiple sites with positive rheumatoid factor ([facility])   History of hypertension   History of hypothyroidism   Abnormal liver function   Diarrhea   Pulmonary nodule   Metabolic acidosis   Severe protein-calorie malnutrition ([facility])   Rectal bleeding   LFTs abnormal   Cirrhosis ([facility])   Pancreatic insufficiency   Ascites of liver   AKI (acute kidney injury) ([place])  Blood in stool, CMV colitis - Colonoscopy this admit was performed with findings of localized inflammation at ileocecal valve, external and internal hemorrhoids; see report -CMV found to be negative - No frank bleeding noted. Pt did receive 2 units PRBC tx on 8/7 for hgb of 6.7, see below     - Seems stable at this time, GI has since signed off    Abnormal liver function with hepatorenal syndrome - MRCP this admit demonstrated severe diffuse hepatic steatosis (can't exclude cirrhosis) - solitary subcentimeter R liver lesion, likely benign cyst  - Unclear etiology, although had been on methotrexate - Large ascites and worsening renal function concerning for hepatorenal syndrome, see below - Pt is s/p large volume paracentesis yielding 4.5L on  7/22 - Liver US performed: 1. Hepatic cirrhosis. Moderate volume ascites, most prominent in the right lower quadrant. 2. Minimal to and fro flow within the main portal and splenic veins. No discrete thrombosis appreciated. 3. Slow hepatopetal flow within the right and left portal veins. 4. Patent paraumbilical varix with hepatofugal flow.      - Initial plan for liver biopsy this admit, however this had remained on hold and is unlikely to change management.      -GI has since signed off  Chronic diarrhea - gliadin antibodies - wnl, TTG - normal - Initially resolved with colestid BID, imodium, and creon - Colestid stopped 7/31 as it may contribute to nausea, also to get a better assessment of diarrhea - Entocort started 7/31with improvement noted, stable  Community Acquired Pneumonia - Bld Cx Neg  - Completed course of Ceftriaxone/azithro x 5 days - Pt has remained afebrile thus far  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 - Concern for hepatorenal syndrome - Baseline Cr around 1.5 - SCr currently 1.99 with decent urine output overnight - Nephrology following. Pt is continued on '120mg'$  PO lasix BID  Hypotension/Hx of Hypertension - Continue holding antihypertensives for now - BP remains stable, labs reviewed - tolerating lasix  Non- AG acidosis - resolved  N/V - Zofran '4mg'$  iv q6h prn - Colestid recently stopped by GI as it may contribute to nausea - presently stable at this time -on marinol; continue to encourage PO intake.Still quite limited  Hypothyroidism - Cont Levothyroxine 88 micrograms po qday as tolerated     - Remains stable  Anxiety - Cont Lexapro '10mg'$  po qday as pt tolerates - Currently stable  Normocytic  Anemia - stable, no frank bleed reported - Hgb of 6.7 on 8/7, now s/p 2 units of PRBC with appropriate correction     - most recent hgb of 10.1 on 8/10. Remains  hemodynamically stable   RA  - Currently off Humira, follows with [facility]  - Will continue with Voltaren gel as needed  Pulmonary Nodule - Outpatient follow up recommended when stable  Hypoglycemia - stable, monitor, continue to encourage diet  Hypomagnesemia/Hypophospatemia - Thus far stable  Debility - PT recs for SNF. Given global decline Palliative Care following     - Disposition is uncertain at this time   Toxic metabolic encephalopathy - resolved  Anasarca - BLE edema and signs of volume overload - 2d echo reviewed, normal LVEF - Suspect anasarca related to marked malnutrition per above     - Curerrently maintained on TPN per below, weaning .   - Edema improved with diuresis above  Severe protein calorie malnutrition - anorexia for several months prior to admit noted - Likely contributing to anasarca above - Unable to tolerate enteric feeding, now with TPN -Weaning TPN as tolerated     - Pt is continued on marinol  End of Life - Appreciate input by Palliative Care -Plan for now is to continue current full scope of tx -Again met with pt's husband at bedside today. Family is made aware of pt's guarded long-term prognosis  DVT prophylaxis: SCD's Code Status: Full Family Communication: Pt in room, family at bedside Disposition Plan: Uncertain at this time  Consultants:   GI  Nephrology  Palliative Care  Procedures:   Colonoscopy 7/19  EGD 7/31  Antimicrobials: Anti-infectives (From admission, onward)   Start     Dose/Rate Route Frequency Ordered Stop   01/07/19 1700  cefTRIAXone (ROCEPHIN) 2 g in sodium chloride 0.9 % 100 mL IVPB     2 g 200 mL/hr over 30 Minutes Intravenous Every 24 hours 01/07/19 1557 01/11/19 1908   01/07/19 1700  azithromycin (ZITHROMAX) 500 mg in sodium chloride 0.9 % 250 mL IVPB     500 mg 250 mL/hr over 60 Minutes Intravenous Every 24 hours 01/07/19 1557  01/11/19 2014      Subjective: Without complaints at this time  Objective: Vitals:   02/02/19 2122 02/03/19 0500 02/03/19 0648 02/03/19 1444  BP: 137/68  (!) 132/58 (!) 149/79  Pulse: 83  82 85  Resp: '16  16 18  '$ Temp: 97.9 F (36.6 C)  98.1 F (36.7 C) 98.7 F (37.1 C)  TempSrc: Oral  Oral Oral  SpO2: 94%  92% 95%  Weight:  71 kg    Height:        Intake/Output Summary (Last 24 hours) at 02/03/2019 1807 Last data filed at 02/03/2019 1753 Gross per 24 hour  Intake 2241.78 ml  Output 3950 ml  Net -1708.22 ml   Filed Weights   02/01/19 0456 02/02/19 0547 02/03/19 0500  Weight: 73.8 kg 75 kg 71 kg    Examination: General exam: Awake, laying in bed, in nad Respiratory system: Normal respiratory effort, no wheezing Cardiovascular system: regular rate, s1, s2 Gastrointestinal system: Soft, nondistended, positive BS Central nervous system: CN2-12 grossly intact, strength intact Extremities: Perfused, no clubbing Skin: Normal skin turgor, no notable skin lesions seen Psychiatry: Mood normal // no visual hallucinations   Data Reviewed: I have personally reviewed following labs and imaging studies  CBC: Recent Labs  Lab 01/28/19 0305 01/29/19 0903 01/31/19 0500  WBC 7.4  --  10.8*  NEUTROABS 5.4  --  7.7  HGB 6.7* 10.7* 10.1*  HCT 21.5* 33.2* 32.6*  MCV 102.9*  --  100.9*  PLT 138*  --  970*   Basic Metabolic Panel: Recent Labs  Lab 01/30/19 0629 01/31/19 0500 02/01/19 0352 02/02/19 0600 02/03/19 0326  NA 147* 143 144 144 144  K 4.1 4.7 4.7 4.5 3.7  CL 106 102 101 99 101  CO2 30 30 32 32 31  GLUCOSE 112* 101* 103* 83 104*  BUN 64* 69* 85* 95* 108*  CREATININE 2.26* 2.08* 2.06* 2.09* 1.99*  CALCIUM 9.4 9.2 9.5 9.5 9.3  MG 1.9 1.8 2.2 2.8* 2.5*  PHOS 2.9 3.3 4.4 4.8* 3.6   GFR: Estimated Creatinine Clearance: 24.7 mL/min (A) (by C-G formula based on SCr of 1.99 mg/dL (H)). Liver Function Tests: Recent Labs  Lab 01/28/19 0305 01/31/19 0500 02/03/19  0326  AST 32 46* 106*  ALT 11 16 57*  ALKPHOS 88 77 95  BILITOT 2.4* 1.7* 1.3*  PROT 6.0* 5.7* 5.8*  ALBUMIN 4.5 4.3 3.8   No results for input(s): LIPASE, AMYLASE in the last 168 hours. No results for input(s): AMMONIA in the last 168 hours. Coagulation Profile: No results for input(s): INR, PROTIME in the last 168 hours. Cardiac Enzymes: No results for input(s): CKTOTAL, CKMB, CKMBINDEX, TROPONINI in the last 168 hours. BNP (last 3 results) No results for input(s): PROBNP in the last 8760 hours. HbA1C: No results for input(s): HGBA1C in the last 72 hours. CBG: Recent Labs  Lab 01/31/19 1550 02/01/19 0044 02/01/19 0718 02/01/19 1607 02/02/19 2138  GLUCAP 135* 136* 110* 142* 112*   Lipid Profile: No results for input(s): CHOL, HDL, LDLCALC, TRIG, CHOLHDL, LDLDIRECT in the last 72 hours. Thyroid Function Tests: No results for input(s): TSH, T4TOTAL, FREET4, T3FREE, THYROIDAB in the last 72 hours. Anemia Panel: No results for input(s): VITAMINB12, FOLATE, FERRITIN, TIBC, IRON, RETICCTPCT in the last 72 hours. Sepsis Labs: No results for input(s): PROCALCITON, LATICACIDVEN in the last 168 hours.  Recent Results (from the past 240 hour(s))  Body fluid culture     Status: None   Collection Time: 01/25/19 11:48 AM   Specimen: Peritoneal Washings; Body Fluid  Result Value Ref Range Status   Specimen Description   Final    PERITONEAL Performed at [facility], [place] [address], [place], [place] [address]    Special Requests   Final    NONE Performed at [facility], [place] [address], [place], [place] [address]    Gram Stain   Final    WBC PRESENT, PREDOMINANTLY MONONUCLEAR NO ORGANISMS SEEN CYTOSPIN SMEAR    Culture   Final    NO GROWTH 3 DAYS Performed at [facility], [place] [address] [place], [place] [address]    Report Status 01/28/2019 FINAL  Final     Radiology Studies: No results found.  Scheduled Meds: . sodium chloride    Intravenous Once  . budesonide  9 mg Oral Daily  . calcium carbonate  500 mg of elemental calcium Oral BID   And  . cholecalciferol  200 Units Oral BID  . dronabinol  2.5 mg Oral BID AC  . escitalopram  10 mg Oral Daily  . furosemide  120 mg Oral BID  . levothyroxine  88 mcg Oral Q0600  . ondansetron  4 mg Intravenous Q8H  . pantoprazole  40 mg Oral BID   Continuous Infusions: . TPN ADULT (ION) 40 mL/hr at  02/03/19 1715     LOS: 1 days   Marylu Lund, MD [facility] Pager On Amion  If 7PM-7AM, please contact night-coverage 02/03/2019, 6:07 PM

## 2019-02-03 NOTE — Plan of Care (Signed)
  Problem: Skin Integrity: Goal: Risk for impaired skin integrity will decrease Outcome: Not Progressing

## 2019-02-03 NOTE — Progress Notes (Signed)
[facility] Progress Note  Subjective: Appetite improving but still not robust, no new symptoms.  I/Os yesterday 2.2/3.1 after switching to oral lasix BID.  Remains on TPN.  Cr improved.  Vitals:   02/02/19 1407 02/02/19 2122 02/03/19 0500 02/03/19 0648  BP: 136/66 137/68  (!) 132/58  Pulse: 80 83  82  Resp: '14 16  16  '$ Temp: 98.1 F (36.7 C) 97.9 F (36.6 C)  98.1 F (36.7 C)  TempSrc: Oral Oral  Oral  SpO2: 94% 94%  92%  Weight:   71 kg   Height:        Inpatient medications: . sodium chloride   Intravenous Once  . budesonide  9 mg Oral Daily  . calcium carbonate  500 mg of elemental calcium Oral BID   And  . cholecalciferol  200 Units Oral BID  . dronabinol  2.5 mg Oral BID AC  . escitalopram  10 mg Oral Daily  . furosemide  160 mg Oral BID  . levothyroxine  88 mcg Oral Q0600  . ondansetron  4 mg Intravenous Q8H  . pantoprazole  40 mg Oral BID   . TPN ADULT (ION) 60 mL/hr at 02/03/19 0830  . TPN ADULT (ION)     acetaminophen, albuterol, diclofenac sodium, loperamide, morphine injection, ondansetron, oxyCODONE, phenol, sodium chloride flush, sodium chloride flush    Exam: Gen alert, chron ill appearing but a bit more energetic and engaged than yest No rash, cyanosis or gangrene Sclera anicteric, throat clear  No jvd or bruits Chest normal WOB, clear ant RRR no MRG Abd soft ntnd, distended MS no joint effusions or deformity Ext 1+  LE edema, no wounds or ulcer Neuro is alert, Ox 3 , nf , no asterixis    Home meds:  - amlodipine 2.5/ metoprolol xl 100 qd  - escitalopram 10 qd  - sod bicarb bid/ Kdur 10 qd/ proair hfa prn/ levothyroxine 88 qd  - prn's/ vitamins/ supplements   CT abd /pelvis 01/06/19 > 1. Abnormal appearance of the liver with heterogeneous hepatic steatosis and heterogeneous liver enhancement. Questionable fine liver surface irregularity. Cannot exclude hepatic cirrhosis. Nodiscrete liver masses. Recommend correlation with  liver enzymes. 2. Third-spacing of fluid with moderate ascites, small dependent bilateral pleural effusions, right greater than left, and mild anasarca. 3. No evidence of bowel obstruction or acute bowel inflammation. Mild colonic diverticulosis, without convincing findings of acute diverticulitis. 4. Patchy bandlike consolidation in the left lung base, cannot exclude pneumonia or aspiration.  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 EF 60% by echo.  Not hypotensive. Alb was 1.7 prior to supplement. Quite robust UOP yesterday and TPN volume decreased for today so decrease lasix to 120 BID.  Creat stable to improved today at 1.99.   Mentation improved. No indication for RRT at this time.   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  No plans for liver biopsy at this time.   Vol overload/ anasarca/ hypoalbuminemia-  as above, avoid IVF's except  D5W or D10 if needed for low BS  Malnutrition -plus liver disease contributing to hypoalbuminemia, currently on TPN and appetite stimulant.  Hopefully TPN is temporary.  Hypokalemia - KCL 40mq today, hopefully TPN can increase KCL in formulation so she doesn't have to take po.   Dispo:  In light of overall status, comorbidities overall prognosis is guarded.  Per discussion with spouse has had general global decline since around 05/2018 to point of not even being able to do many ADLs prior to admission ~1 mo ago.  I'm happy that palliative care is involved as I suspect she's likely to have a continued global decline.    LJannifer HickMD [facility] Pager  [phone]  Iron/TIBC/Ferritin/ %Sat    Component Value Date/Time   IRON 81 01/07/2019 0928   TIBC NOT CALCULATED 01/07/2019 0928   FERRITIN 498 (H) 01/07/2019 0928   IRONPCTSAT NOT CALCULATED 01/07/2019 0928   Recent Labs  Lab 02/03/19 0326  NA 144  K 3.7  CL 101  CO2 31  GLUCOSE 104*  BUN 108*  CREATININE 1.99*  CALCIUM 9.3  PHOS 3.6  ALBUMIN 3.8   Recent Labs  Lab 02/03/19 0326  AST 106*  ALT 57*  ALKPHOS 95  BILITOT 1.3*  PROT 5.8*   Recent Labs  Lab 01/31/19 0500  WBC 10.8*  HGB 10.1*  HCT 32.6*  PLT 112*

## 2019-02-03 NOTE — Progress Notes (Signed)
[place] NOTE   Pharmacy Consult for TPN Indication: intolerance to enteral feeding (refusal to place NGT)   Patient Measurements: Height: _0  (154.9 cm) Weight: 156 lb 8.4 oz (71 kg) IBW/kg (Calculated) : 47.8 TPN AdjBW (KG): 53.8 Body mass index is 29.58 kg/m. Usual Weight: ~70 kg  HPI: 50 yoF with PMH HTN, HLD, COPD, asthma, RA previously on immune modulators, h/o necrotizing glomerulonephritis/vasculitis (+ ANCA), and pancytopenia secondary to cyclophosphamide. She presented to the ED with complaints of chronic diarrhea, intermittent n/v, and some blood noted in stool. During 07/2018 admission in [place], EGD/colonoscopy showed an ulcerated mass-like lesion in the ileocecal valve with satellite ulcers. 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 New diagnosis of cirrhosis this admission with portal HTN and severe ascites.  EGD here still shows duodenal ulcer and distal esophagitis; very poor PO intake. Cortrak feeding tube placed 8/3 but subsequently dislodged. Patient did not tolerate replacement of NGT and now refusing any further attempt at replacement. Pharmacy consulted to dose TPN. Per RD, considered at risk for refeeding  Central access: PICC placed 7/31 TPN start date: 8/6  Significant events:  8/8: phosphorus remains low, will not bolus with fluid overload, will increase in TPN, will not advance TPN rate today. Conisidering dialysis if intake, fluid volume not improved, Renal following. 8/10: concentrating TPN to 65 ml/hr, same total nutrition as with previous formulation at 85 ml/hr  ASSESSMENT                                                                                                           Today, 02/03/2019:  Glucose (CBG goal 100-150) no longer checking routine CBGs, SBG and last night's CBG both WNL  Systemic absorption of Entocort is low (< 20%) so expect minimal effect on  CBGs  Electrolytes - Mg slightly, others WNL although K and Phos dropping  Renal - AoCKD d/t hepatorenal syndrome; SCr now stable but remains elevated (baseline appears to be ~1.5 so not yet back to normal); BUN continues to rise - now > 100; bicarb borderline high but stable; robust UOP with diuresis (continues on Lasix now 160 mg PO bid)  LFTs - AST/ALT rising again although Alk Phos stable WNL, Tbili remains elevated but improved to near-normal; albumin WNL (iatrogenic d/t ascites treatment)  TGs - WNL  Prealbumin - low but improved (8/10)  Current Nutrition - salt-restricted diet, partial intake (charted as 0-50% eaten over last few days)  IVF - none d/t overload  NUTRITIONAL GOALS  RD recs (8/6): Kcal:  2000-2200 kcal Protein:  100-115 grams Fluid:  >/= 2.5 L/day  PLAN                                                                                                                          Continues on KDur, and Tums/Vit D per MD; bicarb and MagOx stopped  At 1800 today:  TPN has already been concentrated to meet 100% of nutritional goals at 60 ml/hr; difficult to tell if TPN is the cause of rising BUN and LFTs, but for now will back off slightly to see if labs improve - Reduce TPN to 40 ml/hr  Concentrated TPN at goal rate of 60 ml/hr provides 100 g of protein and 2037 kcals, meeting 100% of patient needs  Utilizing concentrated (15%) amino acids  Add MVI daily and trace elements MWF (due to national shortage)  Electrolytes: increase K (with reduced rate), add back small Phos, continue to hold Mg; no other changes; Cl:Ac = max Cl  Question if patient appropriate for PO nutritional supplementation?  IVF (none currently) per MD - avoiding d/t ascites/HRS  No longer checking routine CBGs  TPN lab panels on Mondays & Thursdays  Cmet, Mg, Phos tomorrow  Reuel Boom, PharmD,  BCPS [phone] 02/03/2019, 8:25 AM

## 2019-02-04 LAB — MAGNESIUM: Magnesium: 2.5 mg/dL — ABNORMAL HIGH (ref 1.7–2.4)

## 2019-02-04 LAB — COMPREHENSIVE METABOLIC PANEL
ALT: 66 U/L — ABNORMAL HIGH (ref 0–44)
AST: 107 U/L — ABNORMAL HIGH (ref 15–41)
Albumin: 3.7 g/dL (ref 3.5–5.0)
Alkaline Phosphatase: 103 U/L (ref 38–126)
Anion gap: 11 (ref 5–15)
BUN: 113 mg/dL — ABNORMAL HIGH (ref 8–23)
CO2: 32 mmol/L (ref 22–32)
Calcium: 9.3 mg/dL (ref 8.9–10.3)
Chloride: 102 mmol/L (ref 98–111)
Creatinine, Ser: 2.01 mg/dL — ABNORMAL HIGH (ref 0.44–1.00)
GFR calc Af Amer: 29 mL/min — ABNORMAL LOW (ref >60)
GFR calc non Af Amer: 25 mL/min — ABNORMAL LOW (ref >60)
Glucose, Bld: 108 mg/dL — ABNORMAL HIGH (ref 70–99)
Potassium: 3.9 mmol/L (ref 3.5–5.1)
Sodium: 145 mmol/L (ref 135–145)
Total Bilirubin: 1.4 mg/dL — ABNORMAL HIGH (ref 0.3–1.2)
Total Protein: 5.7 g/dL — ABNORMAL LOW (ref 6.5–8.1)

## 2019-02-04 LAB — PHOSPHORUS: Phosphorus: 3.7 mg/dL (ref 2.5–4.6)

## 2019-02-04 MED ORDER — FUROSEMIDE 40 MG PO TABS
80.0000 mg | ORAL_TABLET | Freq: Two times a day (BID) | ORAL | Status: DC
  Administered 2019-02-04 – 2019-02-05 (×2): 80 mg via ORAL
  Filled 2019-02-04 (×2): qty 2

## 2019-02-04 MED ORDER — TRACE MINERALS CR-CU-MN-SE-ZN 10-1000-500-60 MCG/ML IV SOLN
INTRAVENOUS | Status: AC
  Administered 2019-02-04: 18:00:00 via INTRAVENOUS
  Filled 2019-02-04: qty 448

## 2019-02-04 NOTE — Plan of Care (Signed)
  Problem: Elimination: Goal: Will not experience complications related to bowel motility Outcome: Progressing Goal: Will not experience complications related to urinary retention Outcome: Progressing   Problem: Health Behavior/Discharge Planning: Goal: Ability to manage health-related needs will improve Outcome: Not Progressing   Problem: Clinical Measurements: Goal: Ability to maintain clinical measurements within normal limits will improve Outcome: Not Progressing   Problem: Skin Integrity: Goal: Risk for impaired skin integrity will decrease Outcome: Not Progressing

## 2019-02-04 NOTE — Progress Notes (Signed)
Physical Therapy Treatment Patient Details Name: Kaylee Rasmussen MRN: [id number] DOB: 1951/01/14 Today's Date: 02/04/2019    History of Present Illness 68 yo female admitted to ED 7/16 with rectal bleeding, CT abdomen pelvis revealing ? liver cirrhosis, ascites, small pleural effusions. Colonoscopy 7/19 reveals internal and external hemorrhoids, diverticulosis. 7/22 paracentesis yielding 4.5L clear yellow fluid. PMH includes HTN, HLD, asthma, RA, necrotizing glomerulonephritis/vasculitis, pancytopenia, chronic diarrhea with N/V.    PT Comments    Pt not very willing to participate today.  Pt stated she is "sick of being sick". Spouse at bed side.  Very helpful.  Max encouragement, assisted OOB to [facility].  Than only amb 2 feet to recliner.   Follow Up Recommendations  SNF     Equipment Recommendations  None recommended by PT    Recommendations for Other Services       Precautions / Restrictions Precautions Precautions: Fall Restrictions Weight Bearing Restrictions: No    Mobility  Bed Mobility Overal bed mobility: Needs Assistance Bed Mobility: Supine to Sit     Supine to sit: Max assist;Mod assist Sit to supine: Max assist;Total assist   General bed mobility comments: required increased assist  Transfers Overall transfer level: Needs assistance Equipment used: Rolling walker (2 wheeled) Transfers: Sit to/from [facility] Sit to Stand: Min assist;Mod assist;+2 safety/equipment Stand pivot transfers: Mod assist;Max assist;+2 safety/equipment       General transfer comment: assisted from elevated bed to [facility] then back to bed with MAX encouragement and 25% VC's on safety with turns, turn completion and hand transfers.  Ambulation/Gait Ambulation/Gait assistance: Min assist;Mod assist Gait Distance (Feet): 2 Feet Assistive device: Rolling walker (2 wheeled) Gait Pattern/deviations: Step-through pattern;Decreased stride length Gait velocity: decreased    General Gait Details: declined to attempt amb in hallway   Stairs             Wheelchair Mobility    Modified Rankin (Stroke Patients Only)       Balance                                            Cognition Arousal/Alertness: Awake/alert Behavior During Therapy: WFL for tasks assessed/performed Overall Cognitive Status: Within Functional Limits for tasks assessed                                 General Comments: pt required Max encouragement "not feeling it today"      Exercises      General Comments        Pertinent Vitals/Pain Pain Assessment: No/denies pain    Home Living                      Prior Function            PT Goals (current goals can now be found in the care plan section) Progress towards PT goals: Progressing toward goals    Frequency    Min 2X/week      PT Plan Current plan remains appropriate    Co-evaluation              AM-PAC PT "6 Clicks" Mobility   Outcome Measure  Help needed turning from your back to your side while in a flat bed without using bedrails?: A Lot Help needed moving from lying  on your back to sitting on the side of a flat bed without using bedrails?: A Lot Help needed moving to and from a bed to a chair (including a wheelchair)?: A Lot Help needed standing up from a chair using your arms (e.g., wheelchair or bedside chair)?: A Lot Help needed to walk in hospital room?: A Lot Help needed climbing 3-5 steps with a railing? : Total 6 Click Score: 11    End of Session Equipment Utilized During Treatment: Gait belt Activity Tolerance: Treatment limited secondary to medical complications (Comment) Patient left: in chair;with call bell/phone within reach;with family/visitor present Nurse Communication: Mobility status PT Visit Diagnosis: Other abnormalities of gait and mobility (R26.89);Muscle weakness (generalized) (M62.81);History of falling (Z91.81)      Time: 6712-4580 PT Time Calculation (min) (ACUTE ONLY): 32 min  Charges:  $Gait Training: 8-22 mins $Therapeutic Activity: 8-22 mins                     Rica Koyanagi  PTA Acute  Rehabilitation Services Pager      [phone] Office      [phone]

## 2019-02-04 NOTE — Plan of Care (Signed)
  Problem: Elimination: Goal: Will not experience complications related to bowel motility Outcome: Progressing Goal: Will not experience complications related to urinary retention Outcome: Progressing   

## 2019-02-04 NOTE — Progress Notes (Signed)
PHARMACY - ADULT TOTAL PARENTERAL NUTRITION CONSULT NOTE   Pharmacy Consult for TPN Indication: intolerance to enteral feeding (refusal to place NGT)   Patient Measurements: Height: '5\' 1"'$  (154.9 cm) Weight: 149 lb 11.1 oz (67.9 kg) IBW/kg (Calculated) : 47.8 TPN AdjBW (KG): 53.8 Body mass index is 28.28 kg/m. Usual Weight: ~70 kg  HPI: 29 yoF with PMH HTN, HLD, COPD, asthma, RA previously on immune modulators, h/o necrotizing glomerulonephritis/vasculitis (+ ANCA), and pancytopenia secondary to cyclophosphamide. She presented to the ED with complaints of chronic diarrhea, intermittent n/v, and some blood noted in stool. During 07/2018 admission in [place], EGD/colonoscopy showed an ulcerated mass-like lesion in the ileocecal valve with satellite ulcers. 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 New diagnosis of cirrhosis this admission with portal HTN and severe ascites.  EGD here still shows duodenal ulcer and distal esophagitis; very poor PO intake. Cortrak feeding tube placed 8/3 but subsequently dislodged. Patient did not tolerate replacement of NGT and now refusing any further attempt at replacement. Pharmacy consulted to dose TPN. Per RD, considered at risk for refeeding  Central access: PICC placed 7/31 TPN start date: 8/6  Significant events:  - 8/8: phosphorus remains low, will not bolus with fluid overload, will increase in TPN, will not advance TPN rate today. Conisidering dialysis if intake, fluid volume not improved, Renal following. - 8/10: concentrating TPN to 65 ml/hr, same total nutrition as with previous formulation at 85 ml/hr  ASSESSMENT                                                                                                           Today, 02/04/2019:  Glucose (CBG goal 100-150) no longer checking routine CBGs; SBG WNL  Systemic absorption of Entocort is low (< 20%) so expect minimal effect on CBGs  Electrolytes - Mg  slightly elevated, others stable WNL  Renal - AoCKD d/t hepatorenal syndrome; SCr now stable but remains elevated (baseline appears to be ~1.5 so not yet back to normal); BUN elevated and continues to rise although smaller increase from yesterday after reducing TPN rate; bicarb borderline high but stable; robust UOP with diuresis (Lasix held this AM)  LFTs - AST/ALT still elevated but stable overnight; Alk Phos stable WNL, Tbili remains slightly elevated but stable; albumin WNL (iatrogenic d/t ascites treatment)  TGs - WNL  Prealbumin - low but improved (8/10)  Current Nutrition - salt-restricted diet, partial intake (charted as 0-50% eaten over last few days)  IVF - none d/t overload  NUTRITIONAL GOALS  RD recs (8/6): Kcal:  2000-2200 kcal Protein:  100-115 grams Fluid:  >/= 2.5 L/day  PLAN                                                                                                                          Continues on Tums/Vit D per MD; bicarb, Kdur, and MagOx stopped  At 1800 today:  TPN has already been concentrated to meet 100% of nutritional goals at 60 ml/hr; difficult to tell if TPN is the cause of rising BUN and LFTs, but for now will continue at reduced rate of 40 ml/hr.  Concentrated TPN at goal rate of 60 ml/hr provides 100 g of protein and 2037 kcals, meeting 100% of patient needs  Utilizing concentrated (15%) amino acids  Add MVI daily and trace elements MWF (due to national shortage)  Electrolytes: continue to hold Mag; others unchanged from yesterday; Cl:Ac = max Cl  Question if patient appropriate for PO nutritional supplementation?  IVF (none currently) per MD - avoiding d/t ascites/HRS  No longer checking routine CBGs  TPN lab panels on Mondays & Thursdays  Cmet, Phos tomorrow  Reuel Boom, PharmD, BCPS [phone] 02/04/2019, 7:19 AM

## 2019-02-04 NOTE — Care Management Important Message (Signed)
Important Message  Patient Details IM Letter given to Rhea Pink SW to present to the Patient Name: Kaylee Rasmussen MRN: [id number] Date of Birth: November 29, 1950   Medicare Important Message Given:  Yes     Kerin Salen 02/04/2019, 10:09 AM

## 2019-02-04 NOTE — Progress Notes (Signed)
PROGRESS NOTE    Kaylee Rasmussen  VEL:[id number] DOB: 12/20/1950 DOA: 01/06/2019 PCP: Nicoletta Dress, MD    Brief Narrative:  68 y.o.female,hypertension, hyperlipidemia, Asthma, Rheumatoid arthritis (formerly on humira->CMV colitis, HSV esophagitis February 2020), h/o necrotizing glomerulonephritis/ vasculitis(+ ANCA), pancytopenia secondary to cyclophosphamide , has c/o chronic diarrhea and some intermittent n/v, and apparently noticed some blood in stool and told to come to ER for evaluation by GI. She was admitted for blood in stool and chronic diarrhea and abnormal liver function. She's now s/p colonoscopy with GI  Assessment & Plan:   Principal Problem:   ARF (acute renal failure) (HCC) Active Problems:   Rheumatoid arthritis involving multiple sites with positive rheumatoid factor (HCC)   History of hypertension   History of hypothyroidism   Abnormal liver function   Diarrhea   Pulmonary nodule   Metabolic acidosis   Severe protein-calorie malnutrition (HCC)   Rectal bleeding   LFTs abnormal   Cirrhosis (HCC)   Pancreatic insufficiency   Ascites of liver   AKI (acute kidney injury) ([place])  Blood in stool, CMV colitis - Colonoscopy this admit was performed with findings of localized inflammation at ileocecal valve, external and internal hemorrhoids; see report -CMV found to be negative - No frank bleeding noted. Pt did receive 2 units PRBC tx on 8/7 for hgb of 6.7, see below     - Seems stable at this time, GI has since signed off as of 8/13  Abnormal liver function with hepatorenal syndrome - MRCP this admit demonstrated severe diffuse hepatic steatosis (can't exclude cirrhosis) - solitary subcentimeter R liver lesion, likely benign cyst  - Unclear etiology, although had been on methotrexate - Large ascites and worsening renal function concerning for hepatorenal syndrome, see below - Pt is s/p large volume paracentesis yielding  4.5L on 7/22 - Liver US performed: 1. Hepatic cirrhosis. Moderate volume ascites, most prominent in the right lower quadrant. 2. Minimal to and fro flow within the main portal and splenic veins. No discrete thrombosis appreciated. 3. Slow hepatopetal flow within the right and left portal veins. 4. Patent paraumbilical varix with hepatofugal flow.      - Initial plan for liver biopsy this admit, however this had remained on hold and is unlikely to change management.      -GI has since signed off as of 8/13  Chronic diarrhea - gliadin antibodies - wnl, TTG - normal - Initially resolved with colestid BID, imodium, and creon - Colestid stopped 7/31 as it may contribute to nausea, also to get a better assessment of diarrhea - Entocort started 7/31with improvement noted, thus far stable  Community Acquired Pneumonia - Bld Cx Neg  - Completed course of Ceftriaxone/azithro x 5 days - Remains stable  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 - Concern for hepatorenal syndrome - Baseline Cr around 1.5 - SCr currently stable with very good urine output - Nephrology following. PO lasix decreased to 80mg  bid  Hypotension/Hx of Hypertension - Continue holding antihypertensives for now - BP remains stable, labs reviewed - tolerating PO lasix per above  Non- AG acidosis - resolved  N/V - Zofran 4mg  iv q6h prn - Colestid recently stopped by GI as it may contribute to nausea - presently stable at this time -on marinol; continue to encourage PO intake. Remains with minimal PO intake  Hypothyroidism - Cont Levothyroxine 88 micrograms po qday as tolerated     - Currently stable  Anxiety - Cont Lexapro 10mg  po qday as pt  tolerates - Currently stable  Normocytic Anemia - stable, no frank bleed reported - Hgb of 6.7 on 8/7, now s/p 2 units of PRBC with appropriate correction     - most recent hgb  of 10.1 on 8/10. Currently stable   RA  - Currently off Humira, follows with Deveshwar  - Will continue with Voltaren gel as needed  Pulmonary Nodule - Outpatient follow up recommended when stable  Hypoglycemia - stable, monitor, continue to encourage diet  Hypomagnesemia/Hypophospatemia - Thus far stable  Debility - PT recs for SNF. Given global decline Palliative Care following     - Disposition is uncertain at this time   Toxic metabolic encephalopathy - resolved  Anasarca - BLE edema and signs of volume overload - 2d echo reviewed, normal LVEF - Suspect anasarca related to marked malnutrition per above     - Curerrently maintained on TPN per below     - Edema now much improved with lasix  Severe protein calorie malnutrition - anorexia for several months prior to admit noted - Likely contributing to anasarca above - Unable to tolerate enteric feeding, now with TPN -Weaning TPN as tolerated     - Pt is continued on marinol  End of Life - Appreciate input by Palliative Care -Plan for now is to continue current full scope of tx -family is aware of poor long term prognosis. Disposition pending  DVT prophylaxis: SCD's Code Status: Full Family Communication: Pt in room, family at bedside Disposition Plan: Uncertain at this time  Consultants:   GI  Nephrology  Palliative Care  Procedures:   Colonoscopy 7/19  EGD 7/31  Antimicrobials: Anti-infectives (From admission, onward)   Start     Dose/Rate Route Frequency Ordered Stop   01/07/19 1700  cefTRIAXone (ROCEPHIN) 2 g in sodium chloride 0.9 % 100 mL IVPB     2 g 200 mL/hr over 30 Minutes Intravenous Every 24 hours 01/07/19 1557 01/11/19 1908   01/07/19 1700  azithromycin (ZITHROMAX) 500 mg in sodium chloride 0.9 % 250 mL IVPB     500 mg 250 mL/hr over 60 Minutes Intravenous Every 24 hours 01/07/19 1557 01/11/19 2014       Subjective: Without complaints. Still reporting little PO intake  Objective: Vitals:   02/03/19 1444 02/03/19 2102 02/04/19 0505 02/04/19 1229  BP: (!) 149/79 139/66 (!) 160/69 (!) 147/72  Pulse: 85 76 73 81  Resp: 18 15 14 19   Temp: 98.7 F (37.1 C) 98.6 F (37 C) 98.1 F (36.7 C) 97.7 F (36.5 C)  TempSrc: Oral Oral Oral Oral  SpO2: 95% 96% 93% 93%  Weight:   67.9 kg   Height:        Intake/Output Summary (Last 24 hours) at 02/04/2019 1646 Last data filed at 02/04/2019 1436 Gross per 24 hour  Intake 2011.51 ml  Output 3450 ml  Net -1438.49 ml   Filed Weights   02/02/19 0547 02/03/19 0500 02/04/19 0505  Weight: 75 kg 71 kg 67.9 kg    Examination: General exam: Conversant, in no acute distress Respiratory system: normal chest rise, clear, no audible wheezing Cardiovascular system: regular rhythm, s1-s2 Gastrointestinal system: Nondistended, nontender, pos BS Central nervous system: No seizures, no tremors Extremities: No cyanosis, no joint deformities, edema much improved Skin: No rashes, no pallor Psychiatry: Affect normal // no auditory hallucinations   Data Reviewed: I have personally reviewed following labs and imaging studies  CBC: Recent Labs  Lab 01/29/19 0903 01/31/19 0500  WBC  --  10.8*  NEUTROABS  --  7.7  HGB 10.7* 10.1*  HCT 33.2* 32.6*  MCV  --  100.9*  PLT  --  240*   Basic Metabolic Panel: Recent Labs  Lab 01/31/19 0500 02/01/19 0352 02/02/19 0600 02/03/19 0326 02/04/19 0434  NA 143 144 144 144 145  K 4.7 4.7 4.5 3.7 3.9  CL 102 101 99 101 102  CO2 30 32 32 31 32  GLUCOSE 101* 103* 83 104* 108*  BUN 69* 85* 95* 108* 113*  CREATININE 2.08* 2.06* 2.09* 1.99* 2.01*  CALCIUM 9.2 9.5 9.5 9.3 9.3  MG 1.8 2.2 2.8* 2.5* 2.5*  PHOS 3.3 4.4 4.8* 3.6 3.7   GFR: Estimated Creatinine Clearance: 23.9 mL/min (A) (by C-G formula based on SCr of 2.01 mg/dL (H)). Liver Function Tests: Recent Labs  Lab 01/31/19 0500 02/03/19 0326  02/04/19 0434  AST 46* 106* 107*  ALT 16 57* 66*  ALKPHOS 77 95 103  BILITOT 1.7* 1.3* 1.4*  PROT 5.7* 5.8* 5.7*  ALBUMIN 4.3 3.8 3.7   No results for input(s): LIPASE, AMYLASE in the last 168 hours. No results for input(s): AMMONIA in the last 168 hours. Coagulation Profile: No results for input(s): INR, PROTIME in the last 168 hours. Cardiac Enzymes: No results for input(s): CKTOTAL, CKMB, CKMBINDEX, TROPONINI in the last 168 hours. BNP (last 3 results) No results for input(s): PROBNP in the last 8760 hours. HbA1C: No results for input(s): HGBA1C in the last 72 hours. CBG: Recent Labs  Lab 01/31/19 1550 02/01/19 0044 02/01/19 0718 02/01/19 1607 02/02/19 2138  GLUCAP 135* 136* 110* 142* 112*   Lipid Profile: No results for input(s): CHOL, HDL, LDLCALC, TRIG, CHOLHDL, LDLDIRECT in the last 72 hours. Thyroid Function Tests: No results for input(s): TSH, T4TOTAL, FREET4, T3FREE, THYROIDAB in the last 72 hours. Anemia Panel: No results for input(s): VITAMINB12, FOLATE, FERRITIN, TIBC, IRON, RETICCTPCT in the last 72 hours. Sepsis Labs: No results for input(s): PROCALCITON, LATICACIDVEN in the last 168 hours.  No results found for this or any previous visit (from the past 240 hour(s)).   Radiology Studies: No results found.  Scheduled Meds:  sodium chloride   Intravenous Once   budesonide  9 mg Oral Daily   calcium carbonate  500 mg of elemental calcium Oral BID   And   cholecalciferol  200 Units Oral BID   dronabinol  2.5 mg Oral BID AC   escitalopram  10 mg Oral Daily   furosemide  80 mg Oral BID   levothyroxine  88 mcg Oral Q0600   ondansetron  4 mg Intravenous Q8H   pantoprazole  40 mg Oral BID   Continuous Infusions:  TPN ADULT (ION) 40 mL/hr at 02/03/19 1715   TPN ADULT (ION)       LOS: 28 days   Marylu Lund, MD [facility] Pager On Amion  If 7PM-7AM, please contact night-coverage 02/04/2019, 4:46 PM

## 2019-02-04 NOTE — Progress Notes (Signed)
Nutrition Follow-up  INTERVENTION:   -TPN per Pharmacy -Will order [facility] Instant Breakfast packets with Lactaid Milk TID. Each provides 230 kcal and 13g protein.  NUTRITION DIAGNOSIS:   Inadequate oral intake related to acute illness, decreased appetite as evidenced by per patient/family report, meal completion < 50%.  Ongoing.  GOAL:   Patient will meet greater than or equal to 90% of their needs  Not meeting.  MONITOR:   PO intake, Supplement acceptance, Labs, Weight trends  ASSESSMENT:   68 y.o. female with medical history of HTN, hyperlipidemia, asthma, rheumatoid arthritis, CMV colitis, HSV esophagitis in 07/2018, h/o necrotizing glomerulonephritis/vasculitis(+ ANCA), and pancytopenia secondary to cyclophosphamide. She presented to the ED with complaints of chronic diarrhea and intermittent N/V. She noted some blood in the stool and alerted outpatient GI provider who encouraged her to present to the ED. She had an EGD/colonoscopy in 07/2018 which showed an ulcerated mass-like lesion in the ileocecal valve with satellite ulcers, no malignancy noted, did show esophagitis and gastritis. She was admitted for c/o rectal bleeding, chronic diarrhea, non-AG acidosis secondary to diarrhea, and abnormal liver function.  7/16- admitted 7/19 -s/p colonoscopy 7/31 -s/p EGD 8/2 -small bore NGT placed 8/5 -pt removed NGT 8/6 -TPN initiated, pt refused replacement of NGT  **RD working remotely**  Patient reports she is not eating breakfast most days as she is not hungry in the morning and "too much is going on, too much food". States she eats more of her lunch and dinner but could not elaborate. When asked is she would be willing to try protein supplements again, she initially stated she would and she did well with them earlier this admission. RD asked about pt's preference and pt revealed she really didn't like any of them and most of them either hurt her stomach or gave her diarrhea.  Offered Prostat supplements and pt stated "no thank you". Offered [employer] and pt agreed as long as it is made with Lactaid milk as she is lactose "ensured". Pt assumed to mean lactose intolerant but this is not documented in the chart. Will monitor for tolerance of CIB drinks.   Per Pharmacy note, TPN has been decreased to 40 ml/hr given elevated BUN and LFTs. BUN continues to rise despite TPN being decreased. Patient is not eating much, at most 25% of meals, and she is not eating breakfast.   Weight is now back to admission weight (148-149 lbs). Per I/Os: -1.7 L since admit. UOP 8/13: 3.45L  Medications: Calcium carbonate BID, Vitamin D tablet BID, Marinol capsule BID, Iv Zofran every 8 hours,  Labs reviewed: CBGs: 112 Elevated Mg Phos WNL GFR: 25  Diet Order:   Diet Order            Diet 2 gram sodium Room service appropriate? Yes; Fluid consistency: Thin  Diet effective now              EDUCATION NEEDS:   Not appropriate for education at this time  Skin:  Skin Assessment: Reviewed RN Assessment  Last BM:  8/9  Height:   Ht Readings from Last 1 Encounters:  01/09/19 5\' 1"  (1.549 m)    Weight:   Wt Readings from Last 1 Encounters:  02/04/19 67.9 kg    Ideal Body Weight:  47.7 kg  BMI:  Body mass index is 28.28 kg/m.  Estimated Nutritional Needs:   Kcal:  2000-2200 kcal  Protein:  100-115 grams  Fluid:  >/= 2.5 L/day  Clayton Bibles, MS,  RD, LDN [place] [facility] Pager: [phone] After Hours Pager: [phone]

## 2019-02-04 NOTE — Progress Notes (Signed)
[facility] Progress Note  Subjective: Appetite not great - no breakfast, about to work on lunch.   Says LE edema feels worse.  I/Os yesterday 1.9/3.5 despite decreasing dose oral lasix BID.  Remains on TPN.  Cr stable, BUN up ~100.   Vitals:   02/03/19 0648 02/03/19 1444 02/03/19 2102 02/04/19 0505  BP: (!) 132/58 (!) 149/79 139/66 (!) 160/69  Pulse: 82 85 76 73  Resp: '16 18 15 14  '$ Temp: 98.1 F (36.7 C) 98.7 F (37.1 C) 98.6 F (37 C) 98.1 F (36.7 C)  TempSrc: Oral Oral Oral Oral  SpO2: 92% 95% 96% 93%  Weight:    67.9 kg  Height:        Inpatient medications: . sodium chloride   Intravenous Once  . budesonide  9 mg Oral Daily  . calcium carbonate  500 mg of elemental calcium Oral BID   And  . cholecalciferol  200 Units Oral BID  . dronabinol  2.5 mg Oral BID AC  . escitalopram  10 mg Oral Daily  . levothyroxine  88 mcg Oral Q0600  . ondansetron  4 mg Intravenous Q8H  . pantoprazole  40 mg Oral BID   . TPN ADULT (ION) 40 mL/hr at 02/03/19 1715  . TPN ADULT (ION)     acetaminophen, albuterol, diclofenac sodium, HYDROmorphone (DILAUDID) injection, loperamide, ondansetron, oxyCODONE, phenol, sodium chloride flush, sodium chloride flush    Exam: Gen alert, chron ill appearing, NAD No rash, cyanosis or gangrene Sclera anicteric, throat clear  No jvd or bruits Chest normal WOB, clear ant RRR no MRG Abd soft ntnd, distended MS no joint effusions or deformity Ext 1+  LE edema, no wounds or ulcer - stable o nmy exam Neuro is alert, Ox 3 , nf , no asterixis    Home meds:  - amlodipine 2.5/ metoprolol xl 100 qd  - escitalopram 10 qd  - sod bicarb bid/ Kdur 10 qd/ proair hfa prn/ levothyroxine 88 qd  - prn's/ vitamins/ supplements   CT abd /pelvis 01/06/19 > 1. Abnormal appearance of the liver with heterogeneous hepatic steatosis and heterogeneous liver enhancement. Questionable fine liver surface irregularity. Cannot exclude hepatic cirrhosis.  Nodiscrete liver masses. Recommend correlation with liver enzymes. 2. Third-spacing of fluid with moderate ascites, small dependent bilateral pleural effusions, right greater than left, and mild anasarca. 3. No evidence of bowel obstruction or acute bowel inflammation. Mild colonic diverticulosis, without convincing findings of acute diverticulitis. 4. Patchy bandlike consolidation in the left lung base, cannot exclude pneumonia or aspiration.  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 EF 60% by echo.  Not hypotensive. Alb was 1.7 prior to supplement. Quite robust UOP yesterday even with lower dose diuretic.  BUN possibly rising due to TPN, Cr stable at 2 today.  Reduce diuretic further to lasix 80 BID today.    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  No plans for liver biopsy at this time.   Vol overload/ anasarca/ hypoalbuminemia-  as  above, avoid IVF's except D5W or D10 if needed for low BS. Diuresis per above.  Malnutrition -plus liver disease contributing to hypoalbuminemia, currently on TPN and appetite stimulant.  Hopefully TPN is temporary.  Hypokalemia - KCL 46mq yesterday, hopefully TPN can increase KCL in formulation so she doesn't have to take po.   Dispo:  In light of overall status, comorbidities overall prognosis is guarded.  Per discussion with spouse has had general global decline since around 05/2018 to point of not even being able to do many ADLs prior to admission ~1 mo ago.  I'm happy that palliative care is involved as I suspect she's likely to have a continued global decline.  For now she  will continue current care, full code.  MOST form recommended but she was too fatigued to complete so far.  Plan d/c to SNF hopefully after TPN weaned.   LJannifer HickMD [facility] Pager [phone]  Iron/TIBC/Ferritin/ %Sat    Component Value Date/Time   IRON 81 01/07/2019 0928   TIBC NOT CALCULATED 01/07/2019 0928   FERRITIN 498 (H) 01/07/2019 0928   IRONPCTSAT NOT CALCULATED 01/07/2019 0928   Recent Labs  Lab 02/04/19 0434  NA 145  K 3.9  CL 102  CO2 32  GLUCOSE 108*  BUN 113*  CREATININE 2.01*  CALCIUM 9.3  PHOS 3.7  ALBUMIN 3.7   Recent Labs  Lab 02/04/19 0434  AST 107*  ALT 66*  ALKPHOS 103  BILITOT 1.4*  PROT 5.7*   Recent Labs  Lab 01/31/19 0500  WBC 10.8*  HGB 10.1*  HCT 32.6*  PLT 112*

## 2019-02-05 LAB — COMPREHENSIVE METABOLIC PANEL
ALT: 67 U/L — ABNORMAL HIGH (ref 0–44)
AST: 101 U/L — ABNORMAL HIGH (ref 15–41)
Albumin: 4 g/dL (ref 3.5–5.0)
Alkaline Phosphatase: 101 U/L (ref 38–126)
Anion gap: 11 (ref 5–15)
BUN: 114 mg/dL — ABNORMAL HIGH (ref 8–23)
CO2: 31 mmol/L (ref 22–32)
Calcium: 9.6 mg/dL (ref 8.9–10.3)
Chloride: 105 mmol/L (ref 98–111)
Creatinine, Ser: 2.06 mg/dL — ABNORMAL HIGH (ref 0.44–1.00)
GFR calc Af Amer: 28 mL/min — ABNORMAL LOW (ref >60)
GFR calc non Af Amer: 24 mL/min — ABNORMAL LOW (ref >60)
Glucose, Bld: 104 mg/dL — ABNORMAL HIGH (ref 70–99)
Potassium: 3.9 mmol/L (ref 3.5–5.1)
Sodium: 147 mmol/L — ABNORMAL HIGH (ref 135–145)
Total Bilirubin: 1.6 mg/dL — ABNORMAL HIGH (ref 0.3–1.2)
Total Protein: 6.2 g/dL — ABNORMAL LOW (ref 6.5–8.1)

## 2019-02-05 LAB — PHOSPHORUS: Phosphorus: 4.1 mg/dL (ref 2.5–4.6)

## 2019-02-05 LAB — AMMONIA: Ammonia: 21 umol/L (ref 9–35)

## 2019-02-05 MED ORDER — STERILE WATER FOR INJECTION IV SOLN
INTRAVENOUS | Status: AC
  Administered 2019-02-05: 18:00:00 via INTRAVENOUS
  Filled 2019-02-05: qty 448

## 2019-02-05 NOTE — Progress Notes (Signed)
[facility] NOTE   Pharmacy Consult for TPN Indication: intolerance to enteral feeding (refusal to place NGT)   Patient Measurements: Height: _0  (154.9 cm) Weight: 154 lb 15.7 oz (70.3 kg) IBW/kg (Calculated) : 47.8 TPN AdjBW (KG): 53.8 Body mass index is 29.28 kg/m. Usual Weight: ~70 kg  HPI: 64 yoF with PMH HTN, HLD, COPD, asthma, RA previously on immune modulators, h/o necrotizing glomerulonephritis/vasculitis (+ ANCA), and pancytopenia secondary to cyclophosphamide. She presented to the ED with complaints of chronic diarrhea, intermittent n/v, and some blood noted in stool. During 07/2018 admission in [place], EGD/colonoscopy showed an ulcerated mass-like lesion in the ileocecal valve with satellite ulcers. 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 New diagnosis of cirrhosis this admission with portal HTN and severe ascites.  EGD here still shows duodenal ulcer and distal esophagitis; very poor PO intake. Cortrak feeding tube placed 8/3 but subsequently dislodged. Patient did not tolerate replacement of NGT and now refusing any further attempt at replacement. Pharmacy consulted to dose TPN. Per RD, considered at risk for refeeding  Central access: PICC placed 7/31 TPN start date: 8/6  Significant events:  - 8/8: phosphorus remains low, will not bolus with fluid overload, will increase in TPN, will not advance TPN rate today. Conisidering dialysis if intake, fluid volume not improved, Renal following. - 8/10: concentrating TPN to 65 ml/hr, same total nutrition as with previous formulation at 85 ml/hr  ASSESSMENT                                                                                                           Today, 02/05/2019:  Glucose (CBG goal 100-150) no longer checking routine CBGs; SBG WNL  Systemic absorption of Entocort is low (< 20%) so expect minimal effect on CBGs  Electrolytes - no new  labs today except for phos (4.1)  Renal - AoCKD d/t hepatorenal syndrome; SCr now stable but remains elevated (baseline appears to be ~1.5 so not yet back to normal); BUN elevated and continues to rise; bicarb borderline high but stable  LFTs - AST/ALT still elevated but stable; Alk Phos stable WNL, Tbili remains slightly elevated and up 1.6; albumin WNL (iatrogenic d/t ascites treatment)  TGs - WNL  Prealbumin - low but improved (8/10)  Current Nutrition - salt-restricted diet, partial intake (charted as 0-50% eaten over last few days)  IVF - none d/t overload  NUTRITIONAL GOALS                                                                                              RD recs (8/6): Kcal:  2000-2200 kcal Protein:  100-115 grams Fluid:  >/= 2.5 L/day  PLAN                                                                                                                          Continues on Tums/Vit D per MD; bicarb, Kdur, and MagOx stopped  At 1800 today:  TPN rate decreased to 40 ml/hr d/t difficulty determining if TPN is the cause of rising BUN and LFTs. Continue current TPN at 40 ml/hr  Concentrated TPN at goal rate of 60 ml/hr provides 100 g of protein and 2037 kcals, meeting 100% of patient needs  Utilizing concentrated (15%) amino acids  Add MVI daily and trace elements MWF (due to national shortage)  Electrolytes: continue to hold Mag; others unchanged from yesterday; Cl:Ac = max Cl  IVF (none currently) per MD - avoiding d/t ascites/HRS  No longer checking routine CBGs  TPN lab panels on Mondays & Thursdays  Cmet, Phos, [place], PharmD, BCPS 02/05/2019 7:48 AM

## 2019-02-05 NOTE — Progress Notes (Signed)
[facility] Progress Note  Subjective:  No new issues. Still with poor po intake - not really able to verbalize if it's due to lack of appetite or pain/discomfort.  I/Os yesterday 1.1/2.8 despite decreasing dose oral lasix BID.  Remains on TPN.  Cr from 2.01 to 2.06, BUN up ~114.   Vitals:   02/04/19 0505 02/04/19 1229 02/04/19 2128 02/05/19 0522  BP: (!) 160/69 (!) 147/72 (!) 160/79 (!) 154/80  Pulse: 73 81 84 76  Resp: _0 Temp: 98.1 F (36.7 C) 97.7 F (36.5 C) 98.9 F (37.2 C) 98.4 F (36.9 C)  TempSrc: Oral Oral Oral Oral  SpO2: 93% 93% 92% 94%  Weight: 67.9 kg   70.3 kg  Height:        Inpatient medications: . sodium chloride   Intravenous Once  . budesonide  9 mg Oral Daily  . calcium carbonate  500 mg of elemental calcium Oral BID   And  . cholecalciferol  200 Units Oral BID  . dronabinol  2.5 mg Oral BID AC  . escitalopram  10 mg Oral Daily  . furosemide  80 mg Oral BID  . levothyroxine  88 mcg Oral Q0600  . ondansetron  4 mg Intravenous Q8H  . pantoprazole  40 mg Oral BID   . TPN ADULT (ION) 40 mL/hr at 02/04/19 1756  . TPN ADULT (ION)     acetaminophen, albuterol, diclofenac sodium, HYDROmorphone (DILAUDID) injection, loperamide, ondansetron, oxyCODONE, phenol, sodium chloride flush, sodium chloride flush    Exam: Gen alert, chron ill appearing, NAD Sclera anicteric, throat clear  No jvd or bruits Chest normal WOB, clear ant RRR no MRG Abd soft ntnd, distended MS no joint effusions or deformity Ext 1+  LE edema, no wounds or ulcer - improving on my exam AOx3, fatigued but conversant    Home meds:  - amlodipine 2.5/ metoprolol xl 100 qd  - escitalopram 10 qd  - sod bicarb bid/ Kdur 10 qd/ proair hfa prn/ levothyroxine 88 qd  - prn's/ vitamins/ supplements   CT abd /pelvis 01/06/19 > 1. Abnormal appearance of the liver with heterogeneous hepatic steatosis and heterogeneous liver enhancement. Questionable fine liver  surface irregularity. Cannot exclude hepatic cirrhosis. Nodiscrete liver masses. Recommend correlation with liver enzymes. 2. Third-spacing of fluid with moderate ascites, small dependent bilateral pleural effusions, right greater than left, and mild anasarca. 3. No evidence of bowel obstruction or acute bowel inflammation. Mild colonic diverticulosis, without convincing findings of acute diverticulitis. 4. Patchy bandlike consolidation in the left lung base, cannot exclude pneumonia or aspiration.  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 EF 60% by echo.  Not hypotensive. Alb was 1.7 prior to supplement. Quite robust UOP yesterday even with lower dose diuretic.  BUN possibly rising due to TPN vs voluem depletion, Cr up just slightly.  Hold diuretics today.   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  No plans for liver biopsy at this time.   Vol overload/  anasarca/ hypoalbuminemia-  as above, avoid IVF's except D5W or D10 if needed for low BS. Diuresis per above.  Malnutrition -plus liver disease contributing to hypoalbuminemia, currently on TPN and appetite stimulant.  Hopefully TPN is temporary.  Hypokalemia - intermittently requiring supplement in addition to TPN.   Dispo:  In light of overall status, comorbidities overall prognosis is guarded.  Per discussion with spouse has had general global decline since around 05/2018 to point of not even being able to do many ADLs prior to admission ~1 mo ago.  I'm happy that palliative care is involved as I suspect she's likely to have a continued global decline.  For now  she will continue current care, full code.  MOST form recommended but she was too fatigued to complete so far.  Plan d/c to SNF hopefully after TPN weaned. I'm unclear of the plan for weaning TPN.   Jannifer Hick MD [facility] Pager [phone]   Iron/TIBC/Ferritin/ %Sat    Component Value Date/Time   IRON 81 01/07/2019 0928   TIBC NOT CALCULATED 01/07/2019 0928   FERRITIN 498 (H) 01/07/2019 0928   IRONPCTSAT NOT CALCULATED 01/07/2019 0928   Recent Labs  Lab 02/05/19 0411  NA 147*  K 3.9  CL 105  CO2 31  GLUCOSE 104*  BUN 114*  CREATININE 2.06*  CALCIUM 9.6  PHOS 4.1  ALBUMIN 4.0   Recent Labs  Lab 02/05/19 0411  AST 101*  ALT 67*  ALKPHOS 101  BILITOT 1.6*  PROT 6.2*   Recent Labs  Lab 01/31/19 0500  WBC 10.8*  HGB 10.1*  HCT 32.6*  PLT 112*

## 2019-02-05 NOTE — Progress Notes (Signed)
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 She was admitted for blood in stool and chronic diarrhea and abnormal liver function. She's now s/p colonoscopy with GI  Assessment & Plan:   Principal Problem:   ARF (acute renal failure) (HCC) Active Problems:   Rheumatoid arthritis involving multiple sites with positive rheumatoid factor (HCC)   History of hypertension   History of hypothyroidism   Abnormal liver function   Diarrhea   Pulmonary nodule   Metabolic acidosis   Severe protein-calorie malnutrition (HCC)   Rectal bleeding   LFTs abnormal   Cirrhosis (HCC)   Pancreatic insufficiency   Ascites of liver   AKI (acute kidney injury) ([place])  Blood in stool, CMV colitis - Colonoscopy this admit was performed with findings of localized inflammation at ileocecal valve, external and internal hemorrhoids; see report -CMV found to be negative - No frank bleeding noted. Pt did receive 2 units PRBC tx on 8/7 for hgb of 6.7, see below     - Seems stable at this time, GI has since signed off as of 8/13  Abnormal liver function with hepatorenal syndrome - MRCP this admit demonstrated severe diffuse hepatic steatosis (can't exclude cirrhosis) - solitary subcentimeter R liver lesion, likely benign cyst  - Unclear etiology, although had been on methotrexate - Large ascites and worsening renal function concerning for hepatorenal syndrome, see below - Pt is s/p large volume paracentesis yielding  4.5L on 7/22 - Liver US performed: 1. Hepatic cirrhosis. Moderate volume ascites, most prominent in the right lower quadrant. 2. Minimal to and fro flow within the main portal and splenic veins. No discrete thrombosis appreciated. 3. Slow hepatopetal flow within the right and left portal veins. 4. Patent paraumbilical varix with hepatofugal flow.      - Initial plan for liver biopsy this admit, however this had remained on hold and is unlikely to change management.      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 Nephrology following with diuretic now held     - On exam, seems to be worsening LE edema  Hypotension/Hx of Hypertension - Continue holding antihypertensives for now - BP remains stable, labs reviewed - lasix now on hold per above  Non- AG acidosis - resolved  N/V - Zofran 4mg  iv q6h prn - Colestid recently stopped by GI as it may contribute to nausea - presently stable at this time -on marinol; continue to encourage PO intake. Remains with minimal PO intake  Hypothyroidism - Cont Levothyroxine 88 micrograms po qday as tolerated     - Presently stable  Anxiety - Cont Lexapro 10mg   po qday as pt tolerates - Presently stable  Normocytic Anemia - stable, no frank bleed reported - Hgb of 6.7 on 8/7, now s/p 2 units of PRBC with appropriate correction     - most recent hgb of 10.1  on 8/10. Currently stable   RA  - Currently off Humira, follows with [facility]esh[facility]  - Will continue with Voltaren gel as needed  Pulmonary Nodule - Outpatient follow up recommended when stable  Hypoglycemia - stable, monitor, continue to encourage diet  Hypomagnesemia/Hypophospatemia - Thus far stable  Debility - PT recs for SNF. Given global decline Palliative Care following     - Disposition unclear at this time   Toxic metabolic encephalopathy - resolved  Anasarca - BLE edema and signs of volume overload - 2d echo reviewed, normal LVEF - Suspect anasarca related to marked malnutrition per above     - Presently maintained on TPN per below     - Edema now much improved with lasix  Severe protein calorie malnutrition - anorexia for several months prior to admit noted - Likely contributing to anasarca above - Unable to tolerate enteric feeding, now with TPN - Discussed with GI. Plan to continue TPN, even with discharge to SNF, with eventual weaning down the road     - Pt is continued on marinol  End of Life - Appreciate input by [place] for now is to continue current full scope of tx -family is aware of poor long term prognosis. Disposition pending at this time  DVT prophylaxis: SCD's Code Status: Full Family Communication: Pt in room, family at bedside Disposition Plan: Uncertain at this time  Consultants:   GI  Nephrology  Palliative Care  Procedures:   Colonoscopy 7/19  EGD 7/31  Antimicrobials: Anti-infectives (From admission, onward)   Start     Dose/Rate Route Frequency Ordered Stop   01/07/19 1700  cefTRIAXone (ROCEPHIN) 2 g in sodium chloride 0.9 % 100 mL IVPB     2 g 200 mL/hr over 30 Minutes Intravenous Every 24 hours 01/07/19 1557 01/11/19 1908   01/07/19 1700  azithromycin (ZITHROMAX) 500 mg in sodium chloride 0.9 % 250 mL IVPB     500 mg 250 mL/hr over 60 Minutes  Intravenous Every 24 hours 01/07/19 1557 01/11/19 2014      Subjective: No complaints at this time. Still without appetite this AM  Objective: Vitals:   02/04/19 1229 02/04/19 2128 02/05/19 0522 02/05/19 1306  BP: (!) 147/72 (!) 160/79 (!) 154/80 (!) 155/80  Pulse: 81 84 76 79  Resp: 19 20 20 19   Temp: 97.7 F (36.5 C) 98.9 F (37.2 C) 98.4 F (36.9 C) 98.2 F (36.8 C)  TempSrc: Oral Oral Oral Oral  SpO2: 93% 92% 94% 92%  Weight:   70.3 kg   Height:        Intake/Output Summary (Last 24 hours) at 02/05/2019 1630 Last data filed at 02/05/2019 1306 Gross per 24 hour  Intake 637.8 ml  Output 3100 ml  Net -2462.2 ml   Filed Weights   02/03/19 0500 02/04/19 0505 02/05/19 0522  Weight: 71 kg 67.9 kg 70.3 kg    Examination: General exam: Awake, laying in bed, in nad Respiratory system: Normal respiratory effort, no wheezing Cardiovascular system: regular rate, s1, s2 Gastrointestinal system: Soft, nondistended, positive BS Central nervous system: CN2-12 grossly intact, strength intact Extremities: Perfused, no clubbing, BLE edema Skin: Normal skin turgor, no notable skin lesions seen Psychiatry: Mood normal // no visual hallucinations  Data Reviewed: I have personally reviewed following labs and imaging studies  CBC: Recent Labs  Lab 01/31/19 0500  WBC 10.8*  NEUTROABS 7.7  HGB 10.1*  HCT 32.6*  MCV 100.9*  PLT 027*   Basic Metabolic Panel: Recent Labs  Lab 01/31/19 0500 02/01/19 0352 02/02/19 0600 02/03/19 0326 02/04/19 0434 02/05/19 0411  NA 143 144 144 144 145 147*  K 4.7 4.7 4.5 3.7 3.9 3.9  CL 102 101 99 101 102 105  CO2 30 32 32 31 32 31  GLUCOSE 101* 103* 83 104* 108* 104*  BUN 69* 85* 95* 108* 113* 114*  CREATININE 2.08* 2.06* 2.09* 1.99* 2.01* 2.06*  CALCIUM 9.2 9.5 9.5 9.3 9.3 9.6  MG 1.8 2.2 2.8* 2.5* 2.5*  --   PHOS 3.3 4.4 4.8* 3.6 3.7 4.1   GFR: Estimated Creatinine Clearance: 23.8 mL/min (A) (by C-G formula based on SCr of 2.06  mg/dL (H)). Liver Function Tests: Recent Labs  Lab 01/31/19 0500 02/03/19 0326 02/04/19 0434 02/05/19 0411  AST 46* 106* 107* 101*  ALT 16 57* 66* 67*  ALKPHOS 77 95 103 101  BILITOT 1.7* 1.3* 1.4* 1.6*  PROT 5.7* 5.8* 5.7* 6.2*  ALBUMIN 4.3 3.8 3.7 4.0   No results for input(s): LIPASE, AMYLASE in the last 168 hours. Recent Labs  Lab 02/05/19 1538  AMMONIA 21   Coagulation Profile: No results for input(s): INR, PROTIME in the last 168 hours. Cardiac Enzymes: No results for input(s): CKTOTAL, CKMB, CKMBINDEX, TROPONINI in the last 168 hours. BNP (last 3 results) No results for input(s): PROBNP in the last 8760 hours. HbA1C: No results for input(s): HGBA1C in the last 72 hours. CBG: Recent Labs  Lab 01/31/19 1550 02/01/19 0044 02/01/19 0718 02/01/19 1607 02/02/19 2138  GLUCAP 135* 136* 110* 142* 112*   Lipid Profile: No results for input(s): CHOL, HDL, LDLCALC, TRIG, CHOLHDL, LDLDIRECT in the last 72 hours. Thyroid Function Tests: No results for input(s): TSH, T4TOTAL, FREET4, T3FREE, THYROIDAB in the last 72 hours. Anemia Panel: No results for input(s): VITAMINB12, FOLATE, FERRITIN, TIBC, IRON, RETICCTPCT in the last 72 hours. Sepsis Labs: No results for input(s): PROCALCITON, LATICACIDVEN in the last 168 hours.  No results found for this or any previous visit (from the past 240 hour(s)).   Radiology Studies: No results found.  Scheduled Meds: . sodium chloride   Intravenous Once  . budesonide  9 mg Oral Daily  . calcium carbonate  500 mg of elemental calcium Oral BID   And  . cholecalciferol  200 Units Oral BID  . dronabinol  2.5 mg Oral BID AC  . escitalopram  10 mg Oral Daily  . levothyroxine  88 mcg Oral Q0600  . ondansetron  4 mg Intravenous Q8H  . pantoprazole  40 mg Oral BID   Continuous Infusions: . TPN ADULT (ION) 40 mL/hr at 02/04/19 1756  . TPN ADULT (ION)       LOS: 29 days   Marylu Lund, MD [facility] Pager On Amion  If  7PM-7AM, please contact night-coverage 02/05/2019, 4:30 PM

## 2019-02-06 LAB — COMPREHENSIVE METABOLIC PANEL
ALT: 70 U/L — ABNORMAL HIGH (ref 0–44)
AST: 105 U/L — ABNORMAL HIGH (ref 15–41)
Albumin: 3.7 g/dL (ref 3.5–5.0)
Alkaline Phosphatase: 107 U/L (ref 38–126)
Anion gap: 12 (ref 5–15)
BUN: 110 mg/dL — ABNORMAL HIGH (ref 8–23)
CO2: 30 mmol/L (ref 22–32)
Calcium: 9.6 mg/dL (ref 8.9–10.3)
Chloride: 106 mmol/L (ref 98–111)
Creatinine, Ser: 1.87 mg/dL — ABNORMAL HIGH (ref 0.44–1.00)
GFR calc Af Amer: 32 mL/min — ABNORMAL LOW (ref >60)
GFR calc non Af Amer: 27 mL/min — ABNORMAL LOW (ref >60)
Glucose, Bld: 103 mg/dL — ABNORMAL HIGH (ref 70–99)
Potassium: 3.8 mmol/L (ref 3.5–5.1)
Sodium: 148 mmol/L — ABNORMAL HIGH (ref 135–145)
Total Bilirubin: 1.4 mg/dL — ABNORMAL HIGH (ref 0.3–1.2)
Total Protein: 6 g/dL — ABNORMAL LOW (ref 6.5–8.1)

## 2019-02-06 LAB — PHOSPHORUS: Phosphorus: 4 mg/dL (ref 2.5–4.6)

## 2019-02-06 LAB — MAGNESIUM: Magnesium: 2.3 mg/dL (ref 1.7–2.4)

## 2019-02-06 MED ORDER — METOPROLOL TARTRATE 25 MG PO TABS
25.0000 mg | ORAL_TABLET | Freq: Two times a day (BID) | ORAL | Status: DC
  Administered 2019-02-06 – 2019-02-08 (×5): 25 mg via ORAL
  Filled 2019-02-06 (×6): qty 1

## 2019-02-06 MED ORDER — STERILE WATER FOR INJECTION IV SOLN
INTRAVENOUS | Status: AC
  Administered 2019-02-06: 18:00:00 via INTRAVENOUS
  Filled 2019-02-06: qty 448

## 2019-02-06 NOTE — Progress Notes (Addendum)
PHARMACY - ADULT TOTAL PARENTERAL NUTRITION CONSULT NOTE   Pharmacy Consult for TPN Indication: intolerance to enteral feeding (refusal to place NGT)   Patient Measurements: Height: _0  (154.9 cm) Weight: 155 lb 13.8 oz (70.7 kg) IBW/kg (Calculated) : 47.8 TPN AdjBW (KG): 53.8 Body mass index is 29.45 kg/m. Usual Weight: ~70 kg  HPI: 89 yoF with PMH HTN, HLD, COPD, asthma, RA previously on immune modulators, h/o necrotizing glomerulonephritis/vasculitis (+ ANCA), and pancytopenia secondary to cyclophosphamide. She presented to the ED with complaints of chronic diarrhea, intermittent n/v, and some blood noted in stool. During 07/2018 admission in [place], EGD/colonoscopy showed an ulcerated mass-like lesion in the ileocecal valve with satellite ulcers. 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 New diagnosis of cirrhosis this admission with portal HTN and severe ascites.  EGD here still shows duodenal ulcer and distal esophagitis; very poor PO intake. Cortrak feeding tube placed 8/3 but subsequently dislodged. Patient did not tolerate replacement of NGT and now refusing any further attempt at replacement. Pharmacy consulted to dose TPN. Per RD, considered at risk for refeeding  Central access: PICC placed 7/31 TPN start date: 8/6  Significant events:  - 8/8: phosphorus remains low, will not bolus with fluid overload, will increase in TPN, will not advance TPN rate today. Conisidering dialysis if intake, fluid volume not improved, Renal following. - 8/10: concentrating TPN to 65 ml/hr, same total nutrition as with previous formulation at 85 ml/hr  ASSESSMENT                                                                                                           Today, 02/06/2019:  Glucose (CBG goal 100-150) no longer checking routine CBGs; SBG WNL  Systemic absorption of Entocort is low (< 20%) so expect minimal effect on CBGs  Electrolytes -Na 148-  sl elevated.  other lytes WNL  Renal - AoCKD d/t hepatorenal syndrome; SCr now stable/declining  but remains elevated (baseline appears to be ~1.5 so not yet back to normal); BUN elevated at 110;  LFTs - AST/ALT still elevated but stable; Alk Phos stable WNL, Tbili remains slightly elevated; albumin WNL (iatrogenic d/t ascites treatment)  TGs - WNL  Prealbumin - low but improved (8/10)  Current Nutrition - salt-restricted diet, partial intake (charted as 10-25 % eaten over last few days)  IVF - none d/t overload  NUTRITIONAL GOALS                                                                                              RD recs (8/6): Kcal:  2000-2200 kcal Protein:  100-115 grams Fluid:  >/= 2.5 L/day  PLAN  Continues on Tums/Vit D per MD  At 1800 today:  TPN rate decreased to 40 ml/hr on 8/13 d/t difficulty determining if TPN is the cause of rising BUN and LFTs. Continue current TPN at 40 ml/hr  Concentrated TPN at goal rate of 60 ml/hr provides 100 g of protein and 2037 kcals, meeting 100% of patient needs  Utilizing concentrated (15%) amino acids  Add MVI daily and trace elements MWF (due to national shortage)  Electrolytes: continue to hold Mag- may add back tomorrow; decrease Na, others unchanged from yesterday; Cl:Ac = max Cl  IVF (none currently) per MD - avoiding d/t ascites/HRS  No longer checking routine CBGs  TPN lab panels on Mondays & Thursdays  Per MD note 8/15 " Discussed with GI. Plan to continue TPN, even with discharge to SNF, with eventual weaning down the road"  Eudelia Bunch, Pharm.D [phone] 02/06/2019 7:54 AM

## 2019-02-06 NOTE — Progress Notes (Signed)
[facility] Progress Note  Subjective:  No new issues. Still with poor po intake - not really able to verbalize if it's due to lack of appetite or pain/discomfort.  I/Os yesterday 0.5/3.0 despite holding PM dose oral lasix.  Remains on TPN.  Cr improved from 2.06 to 1.87, BUN stable at 110.   Vitals:   02/05/19 0522 02/05/19 1306 02/05/19 2109 02/06/19 0648  BP: (!) 154/80 (!) 155/80 (!) 158/78 (!) 159/74  Pulse: 76 79 82 79  Resp: _0 Temp: 98.4 F (36.9 C) 98.2 F (36.8 C) 98.5 F (36.9 C) 98.5 F (36.9 C)  TempSrc: Oral Oral Oral Oral  SpO2: 94% 92% 93% 91%  Weight: 70.3 kg   70.7 kg  Height:        Inpatient medications: . budesonide  9 mg Oral Daily  . calcium carbonate  500 mg of elemental calcium Oral BID   And  . cholecalciferol  200 Units Oral BID  . dronabinol  2.5 mg Oral BID AC  . escitalopram  10 mg Oral Daily  . levothyroxine  88 mcg Oral Q0600  . ondansetron  4 mg Intravenous Q8H  . pantoprazole  40 mg Oral BID   . TPN ADULT (ION) 40 mL/hr at 02/06/19 0200  . TPN ADULT (ION)     acetaminophen, albuterol, diclofenac sodium, HYDROmorphone (DILAUDID) injection, loperamide, ondansetron, oxyCODONE, phenol, sodium chloride flush, sodium chloride flush    Exam: Gen alert, chron ill appearing, NAD Sclera anicteric, throat clear  No jvd or bruits Chest normal WOB, clear ant RRR no MRG Abd soft ntnd, distended MS no joint effusions or deformity Ext 1+  LE edema, no wounds or ulcer - improving on my exam AOx3, fatigued but conversant    Home meds:  - amlodipine 2.5/ metoprolol xl 100 qd  - escitalopram 10 qd  - sod bicarb bid/ Kdur 10 qd/ proair hfa prn/ levothyroxine 88 qd  - prn's/ vitamins/ supplements   CT abd /pelvis 01/06/19 > 1. Abnormal appearance of the liver with heterogeneous hepatic steatosis and heterogeneous liver enhancement. Questionable fine liver surface irregularity. Cannot exclude hepatic cirrhosis.  Nodiscrete liver masses. Recommend correlation with liver enzymes. 2. Third-spacing of fluid with moderate ascites, small dependent bilateral pleural effusions, right greater than left, and mild anasarca. 3. No evidence of bowel obstruction or acute bowel inflammation. Mild colonic diverticulosis, without convincing findings of acute diverticulitis. 4. Patchy bandlike consolidation in the left lung base, cannot exclude pneumonia or aspiration.  CXR last 7/21 - clear lungs total I/O since admit to 8/9 = 24L in, 13L out > +13L  UA 7/21 > negative  UNa 8/2 <10 Renal US (8/4) 8.6 L and  9 cm R kidneys w/o hydro bilat  Assessment/ Plan:  AKI on CKD 3/4- Baseline cr 1.5-1.6 with AKI peaking here ~8/1 with Cr 2.8 which was thought secondary to HRS with low urine sodium.  She's improved now and Cr 1.87 today.  Quite robust UOP yesterday even with lower dose diuretic.  BUN possibly rising due to TPN vs volume depletion, Cr slowly improving.  Continue to hold diuretics -- In light of improving renal function she may not need ongoing diuretic administration; would dose PRN to maintain euvolemia based on I/Os and daily weights.   CKD 3b - baseline creat 1.5, eGFR 32, due to her known hx of ANCA+ GN treated 2019 (UA this admission bland).  Trending in that direction.  Ascites/ cirrhosis w/ portal HTN -  new diagnosis, possibly due to MTX vs fatty liver, GI is following.  No plans for liver biopsy at this time.    Vol overload/ anasarca/ hypoalbuminemia-  as above, avoid IVF's except D5W or D10 if needed for low BS.   Malnutrition -plus liver disease contributing to hypoalbuminemia, currently on TPN and appetite stimulant.  Hopefully TPN is temporary.  Hypokalemia - intermittently requiring supplement in addition to TPN.   Dispo:  In light of overall status, comorbidities overall prognosis is guarded.  Per discussion with spouse has had general global decline since around 05/2018 to point of not even being  able to do many ADLs prior to admission ~1 mo ago.  I'm happy that palliative care is involved as I suspect she's likely to have a continued global decline.  For now she will continue current care, full code.  MOST form recommended but she was too fatigued to complete so far.  Plan d/c to SNF hopefully after TPN weaned. I'm unclear of the plan for weaning TPN.   Will sign off and plan for f/u in [place] clinic with nephrologist in 1 month.  Please call if we can be of assistance during this admission.   Jannifer Hick MD [facility] Pager [phone]   Iron/TIBC/Ferritin/ %Sat    Component Value Date/Time   IRON 81 01/07/2019 0928   TIBC NOT CALCULATED 01/07/2019 0928   FERRITIN 498 (H) 01/07/2019 0928   IRONPCTSAT NOT CALCULATED 01/07/2019 0928   Recent Labs  Lab 02/06/19 0357  NA 148*  K 3.8  CL 106  CO2 30  GLUCOSE 103*  BUN 110*  CREATININE 1.87*  CALCIUM 9.6  PHOS 4.0  ALBUMIN 3.7   Recent Labs  Lab 02/06/19 0357  AST 105*  ALT 70*  ALKPHOS 107  BILITOT 1.4*  PROT 6.0*   Recent Labs  Lab 01/31/19 0500  WBC 10.8*  HGB 10.1*  HCT 32.6*  PLT 112*

## 2019-02-06 NOTE — Progress Notes (Signed)
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 She was admitted for blood in stool and chronic diarrhea and abnormal liver function. She's now s/p colonoscopy with GI  Assessment & Plan:   Principal Problem:   ARF (acute renal failure) (HCC) Active Problems:   Rheumatoid arthritis involving multiple sites with positive rheumatoid factor (HCC)   History of hypertension   History of hypothyroidism   Abnormal liver function   Diarrhea   Pulmonary nodule   Metabolic acidosis   Severe protein-calorie malnutrition (HCC)   Rectal bleeding   LFTs abnormal   Cirrhosis (HCC)   Pancreatic insufficiency   Ascites of liver   AKI (acute kidney injury) ([place])  Blood in stool, CMV colitis - Colonoscopy this admit was performed with findings of localized inflammation at ileocecal valve, external and internal hemorrhoids; see report -CMV found to be negative - No frank bleeding noted. Pt did receive 2 units PRBC tx on 8/7 for hgb of 6.7, see below     - Pt remains stable at this time, GI has since signed off as of 8/13  Abnormal liver function with hepatorenal syndrome - MRCP this admit demonstrated severe diffuse hepatic steatosis (can't exclude cirrhosis) - solitary subcentimeter R liver lesion, likely benign cyst  - Unclear etiology, although had been on methotrexate - Large ascites and worsening renal function concerning for hepatorenal syndrome, see below - Pt is s/p large volume paracentesis  yielding 4.5L on 7/22 - Liver US performed: 1. Hepatic cirrhosis. Moderate volume ascites, most prominent in the right lower quadrant. 2. Minimal to and fro flow within the main portal and splenic veins. No discrete thrombosis appreciated. 3. Slow hepatopetal flow within the right and left portal veins. 4. Patent paraumbilical varix with hepatofugal flow.      - Initial plan for liver biopsy this admit, however this had remained on hold and is unlikely to change management.      -GI has since signed off as of 8/13  Chronic diarrhea - gliadin antibodies - wnl, TTG - normal - Initially resolved with colestid BID, imodium, and creon - Colestid stopped 7/31 as it may contribute to nausea, also to get a better assessment of diarrhea - Entocort started 7/31with improvement noted, presently stable  Community Acquired Pneumonia - Bld Cx Neg  - Completed course of Ceftriaxone/azithro x 5 days - Presently stable  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 - Concern for hepatorenal syndrome - Baseline Cr around 1.5 - SCr stable at 1.87. BUN of 110. Nephrology following     - diuretics on hold per Nephrology  Hypotension/Hx of Hypertension - Antihypertensives have been held - BP now becoming elevated - Will resume beta blocker. Continue to hold norvasc given concerns of swelling. Will avoid ACEI/ARB given AKI  Non- AG acidosis - resolved  N/V - continued with Zofran 4mg  iv q6h prn - Colestid recently stopped by GI as it may contribute to nausea - presently stable currently -on marinol; continue to encourage PO intake. Still with minimal PO intake despite her husband bringing food in from home  Hypothyroidism - Cont Levothyroxine 88 micrograms po  qday as tolerated     - Presently stable  Anxiety - Cont Lexapro 10mg  po qday as pt tolerates - Currently stable, does seem more depressed  Normocytic Anemia  - stable, no frank bleed reported - Hgb of 6.7 on 8/7, now s/p 2 units of PRBC with appropriate correction     - most recent hgb of 10.1 on 8/10. Hemodynamically stable   RA  - Currently off Humira, follows with Deveshwar  - Will continue with Voltaren gel as needed  Pulmonary Nodule - Outpatient follow up recommended when stable  Hypoglycemia - Remains stable, cont to encourage PO intake  Hypomagnesemia/Hypophospatemia - Thus far stable  Debility - PT recs for SNF. Given global decline Palliative Care following     - Disposition unclear at this time, possible SNF with palliative referral   Toxic metabolic encephalopathy - resolved  Anasarca - BLE edema and signs of volume overload - 2d echo reviewed, normal LVEF - Suspect anasarca related to marked malnutrition per above     - Presently maintained on TPN per below     - Improvement with diuresis  Severe protein calorie malnutrition - anorexia for several months prior to admit noted - Likely contributing to anasarca above - Unable to tolerate enteric feeding, now with TPN - Had recently discussed with GI. GI had recommended continuing TPN, even on discharge to SNF, with plan of eventual weaning down the road     - Pt is continued on marinol  End of Life - Appreciate input by [place] for now is to continue current full scope of tx -family is aware of poor long term prognosis. Disposition remains pending at this time  DVT prophylaxis: SCD's Code Status: Full Family Communication: Pt in room, family at bedside Disposition Plan: Uncertain at this time  Consultants:   GI  Nephrology  Palliative Care  Procedures:   Colonoscopy 7/19  EGD 7/31  Antimicrobials: Anti-infectives (From admission, onward)   Start     Dose/Rate Route Frequency Ordered Stop   01/07/19 1700  cefTRIAXone (ROCEPHIN) 2 g in sodium chloride 0.9 % 100 mL  IVPB     2 g 200 mL/hr over 30 Minutes Intravenous Every 24 hours 01/07/19 1557 01/11/19 1908   01/07/19 1700  azithromycin (ZITHROMAX) 500 mg in sodium chloride 0.9 % 250 mL IVPB     500 mg 250 mL/hr over 60 Minutes Intravenous Every 24 hours 01/07/19 1557 01/11/19 2014      Subjective: Without complaints. Still does not have an appetitie  Objective: Vitals:   02/05/19 0522 02/05/19 1306 02/05/19 2109 02/06/19 0648  BP: (!) 154/80 (!) 155/80 (!) 158/78 (!) 159/74  Pulse: 76 79 82 79  Resp: 20 19 17 18   Temp: 98.4 F (36.9 C) 98.2 F (36.8 C) 98.5 F (36.9 C) 98.5 F (36.9 C)  TempSrc: Oral Oral Oral Oral  SpO2: 94% 92% 93% 91%  Weight: 70.3 kg   70.7 kg  Height:        Intake/Output Summary (Last 24 hours) at 02/06/2019 1213 Last data filed at 02/06/2019 1610 Gross per 24 hour  Intake 554.55 ml  Output 2400 ml  Net -1845.45 ml   Filed Weights   02/04/19 0505 02/05/19 0522 02/06/19 0648  Weight: 67.9 kg 70.3 kg 70.7 kg    Examination: General exam: Conversant, in no acute distress Respiratory system: normal chest rise, clear, no audible wheezing Cardiovascular system: regular rhythm, s1-s2 Gastrointestinal system: Nondistended, nontender, pos BS Central nervous  system: No seizures, no tremors Extremities: No cyanosis, no joint deformities Skin: No rashes, no pallor Psychiatry: Affect normal // no auditory hallucinations   Data Reviewed: I have personally reviewed following labs and imaging studies  CBC: Recent Labs  Lab 01/31/19 0500  WBC 10.8*  NEUTROABS 7.7  HGB 10.1*  HCT 32.6*  MCV 100.9*  PLT 827*   Basic Metabolic Panel: Recent Labs  Lab 02/01/19 0352 02/02/19 0600 02/03/19 0326 02/04/19 0434 02/05/19 0411 02/06/19 0357  NA 144 144 144 145 147* 148*  K 4.7 4.5 3.7 3.9 3.9 3.8  CL 101 99 101 102 105 106  CO2 32 32 31 32 31 30  GLUCOSE 103* 83 104* 108* 104* 103*  BUN 85* 95* 108* 113* 114* 110*  CREATININE 2.06* 2.09* 1.99* 2.01* 2.06*  1.87*  CALCIUM 9.5 9.5 9.3 9.3 9.6 9.6  MG 2.2 2.8* 2.5* 2.5*  --  2.3  PHOS 4.4 4.8* 3.6 3.7 4.1 4.0   GFR: Estimated Creatinine Clearance: 26.3 mL/min (A) (by C-G formula based on SCr of 1.87 mg/dL (H)). Liver Function Tests: Recent Labs  Lab 01/31/19 0500 02/03/19 0326 02/04/19 0434 02/05/19 0411 02/06/19 0357  AST 46* 106* 107* 101* 105*  ALT 16 57* 66* 67* 70*  ALKPHOS 77 95 103 101 107  BILITOT 1.7* 1.3* 1.4* 1.6* 1.4*  PROT 5.7* 5.8* 5.7* 6.2* 6.0*  ALBUMIN 4.3 3.8 3.7 4.0 3.7   No results for input(s): LIPASE, AMYLASE in the last 168 hours. Recent Labs  Lab 02/05/19 1538  AMMONIA 21   Coagulation Profile: No results for input(s): INR, PROTIME in the last 168 hours. Cardiac Enzymes: No results for input(s): CKTOTAL, CKMB, CKMBINDEX, TROPONINI in the last 168 hours. BNP (last 3 results) No results for input(s): PROBNP in the last 8760 hours. HbA1C: No results for input(s): HGBA1C in the last 72 hours. CBG: Recent Labs  Lab 01/31/19 1550 02/01/19 0044 02/01/19 0718 02/01/19 1607 02/02/19 2138  GLUCAP 135* 136* 110* 142* 112*   Lipid Profile: No results for input(s): CHOL, HDL, LDLCALC, TRIG, CHOLHDL, LDLDIRECT in the last 72 hours. Thyroid Function Tests: No results for input(s): TSH, T4TOTAL, FREET4, T3FREE, THYROIDAB in the last 72 hours. Anemia Panel: No results for input(s): VITAMINB12, FOLATE, FERRITIN, TIBC, IRON, RETICCTPCT in the last 72 hours. Sepsis Labs: No results for input(s): PROCALCITON, LATICACIDVEN in the last 168 hours.  No results found for this or any previous visit (from the past 240 hour(s)).   Radiology Studies: No results found.  Scheduled Meds: . budesonide  9 mg Oral Daily  . calcium carbonate  500 mg of elemental calcium Oral BID   And  . cholecalciferol  200 Units Oral BID  . dronabinol  2.5 mg Oral BID AC  . escitalopram  10 mg Oral Daily  . levothyroxine  88 mcg Oral Q0600  . ondansetron  4 mg Intravenous Q8H  .  pantoprazole  40 mg Oral BID   Continuous Infusions: . TPN ADULT (ION) 40 mL/hr at 02/06/19 0200  . TPN ADULT (ION)       LOS: 30 days   Marylu Lund, MD [facility] Pager On Amion  If 7PM-7AM, please contact night-coverage 02/06/2019, 12:13 PM

## 2019-02-07 DIAGNOSIS — N178 Other acute kidney failure: Secondary | ICD-10-CM

## 2019-02-07 LAB — CBC
HCT: 35.5 % — ABNORMAL LOW (ref 36.0–46.0)
Hemoglobin: 10.8 g/dL — ABNORMAL LOW (ref 12.0–15.0)
MCH: 31.7 pg (ref 26.0–34.0)
MCHC: 30.4 g/dL (ref 30.0–36.0)
MCV: 104.1 fL — ABNORMAL HIGH (ref 80.0–100.0)
Platelets: 120 10*3/uL — ABNORMAL LOW (ref 150–400)
RBC: 3.41 MIL/uL — ABNORMAL LOW (ref 3.87–5.11)
RDW: 21.6 % — ABNORMAL HIGH (ref 11.5–15.5)
WBC: 9.5 10*3/uL (ref 4.0–10.5)
nRBC: 0 % (ref 0.0–0.2)

## 2019-02-07 LAB — COMPREHENSIVE METABOLIC PANEL
ALT: 70 U/L — ABNORMAL HIGH (ref 0–44)
AST: 95 U/L — ABNORMAL HIGH (ref 15–41)
Albumin: 3.7 g/dL (ref 3.5–5.0)
Alkaline Phosphatase: 101 U/L (ref 38–126)
Anion gap: 13 (ref 5–15)
BUN: 96 mg/dL — ABNORMAL HIGH (ref 8–23)
CO2: 28 mmol/L (ref 22–32)
Calcium: 9.6 mg/dL (ref 8.9–10.3)
Chloride: 110 mmol/L (ref 98–111)
Creatinine, Ser: 1.79 mg/dL — ABNORMAL HIGH (ref 0.44–1.00)
GFR calc Af Amer: 33 mL/min — ABNORMAL LOW (ref >60)
GFR calc non Af Amer: 29 mL/min — ABNORMAL LOW (ref >60)
Glucose, Bld: 110 mg/dL — ABNORMAL HIGH (ref 70–99)
Potassium: 3.8 mmol/L (ref 3.5–5.1)
Sodium: 151 mmol/L — ABNORMAL HIGH (ref 135–145)
Total Bilirubin: 1.6 mg/dL — ABNORMAL HIGH (ref 0.3–1.2)
Total Protein: 6.2 g/dL — ABNORMAL LOW (ref 6.5–8.1)

## 2019-02-07 LAB — DIFFERENTIAL
Abs Immature Granulocytes: 0.15 10*3/uL — ABNORMAL HIGH (ref 0.00–0.07)
Basophils Absolute: 0 10*3/uL (ref 0.0–0.1)
Basophils Relative: 0 %
Eosinophils Absolute: 0.1 10*3/uL (ref 0.0–0.5)
Eosinophils Relative: 1 %
Immature Granulocytes: 2 %
Lymphocytes Relative: 9 %
Lymphs Abs: 0.8 10*3/uL (ref 0.7–4.0)
Monocytes Absolute: 0.8 10*3/uL (ref 0.1–1.0)
Monocytes Relative: 8 %
Neutro Abs: 7.7 10*3/uL (ref 1.7–7.7)
Neutrophils Relative %: 80 %

## 2019-02-07 LAB — TRIGLYCERIDES: Triglycerides: 78 mg/dL (ref <150)

## 2019-02-07 LAB — SARS CORONAVIRUS 2 (TAT 6-24 HRS): SARS Coronavirus 2: NEGATIVE

## 2019-02-07 LAB — PHOSPHORUS: Phosphorus: 4.2 mg/dL (ref 2.5–4.6)

## 2019-02-07 LAB — MAGNESIUM: Magnesium: 2.4 mg/dL (ref 1.7–2.4)

## 2019-02-07 LAB — PREALBUMIN: Prealbumin: 23.8 mg/dL (ref 18–38)

## 2019-02-07 MED ORDER — HYDRALAZINE HCL 20 MG/ML IJ SOLN: 5.0000 mg | INTRAMUSCULAR | Status: DC | PRN

## 2019-02-07 MED ORDER — TRACE MINERALS CR-CU-MN-SE-ZN 10-1000-500-60 MCG/ML IV SOLN
INTRAVENOUS | Status: AC
  Administered 2019-02-07: 18:00:00 via INTRAVENOUS
  Filled 2019-02-07: qty 448

## 2019-02-07 MED ORDER — HYDRALAZINE HCL 10 MG PO TABS
10.0000 mg | ORAL_TABLET | Freq: Three times a day (TID) | ORAL | Status: DC
  Administered 2019-02-07 – 2019-02-09 (×6): 10 mg via ORAL
  Filled 2019-02-07 (×6): qty 1

## 2019-02-07 MED ORDER — HYDROMORPHONE HCL 1 MG/ML IJ SOLN
1.0000 mg | INTRAMUSCULAR | Status: DC | PRN
  Administered 2019-02-08 – 2019-02-09 (×6): 1 mg via INTRAVENOUS
  Filled 2019-02-07 (×8): qty 1

## 2019-02-07 MED ORDER — BISACODYL 10 MG RE SUPP: 10.0000 mg | Freq: Every day | RECTAL | Status: DC | PRN

## 2019-02-07 MED ORDER — POLYETHYLENE GLYCOL 3350 17 G PO PACK
17.0000 g | PACK | Freq: Every day | ORAL | Status: DC | PRN
  Administered 2019-02-07: 17 g via ORAL
  Filled 2019-02-07: qty 1

## 2019-02-07 NOTE — TOC Progression Note (Signed)
Transition of Care [facility]) - Progression Note    Patient Details  Name: Kaylee Rasmussen MRN: [id number] Date of Birth: 1951/02/17  Transition of Care [facility]) CM/SW [place], LCSW Phone Number: 02/07/2019, 11:04 AM  Clinical Narrative:   Patient has bed at [facility]. Patient is needing a new authorization through [place] since patient Josem Kaufmann had expired. CSW requested that patient also get a new covid test     Expected Discharge Plan: [place] Barriers to Discharge: Continued Medical Work up  Expected Discharge Plan and Services Expected Discharge Plan: [place]   Discharge Planning Services: CM Consult Post Acute Care Choice: [place] arrangements for the past 2 months: Single Family Home Expected Discharge Date: 01/08/19                                     Social Determinants of Health (SDOH) Interventions    Readmission Risk Interventions No flowsheet data found.

## 2019-02-07 NOTE — Progress Notes (Signed)
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 She was admitted for blood in stool and chronic diarrhea and abnormal liver function. She's now s/p colonoscopy with GI  Assessment & Plan:   Principal Problem:   ARF (acute renal failure) (HCC) Active Problems:   Rheumatoid arthritis involving multiple sites with positive rheumatoid factor (HCC)   History of hypertension   History of hypothyroidism   Abnormal liver function   Diarrhea   Pulmonary nodule   Metabolic acidosis   Severe protein-calorie malnutrition (HCC)   Rectal bleeding   LFTs abnormal   Cirrhosis (HCC)   Pancreatic insufficiency   Ascites of liver   AKI (acute kidney injury) ([place])  Blood in stool, CMV colitis - Colonoscopy this admit was performed with findings of localized inflammation at ileocecal valve, external and internal hemorrhoids; see report -CMV found to be negative - No frank bleeding noted. Pt did receive 2 units PRBC tx on 8/7 for hgb of 6.7, see below     - Pt remains hemodynamically stable, GI has since signed off as of 8/13  Abnormal liver function with hepatorenal syndrome - MRCP this admit demonstrated severe diffuse hepatic steatosis (can't exclude cirrhosis) - solitary subcentimeter R liver lesion, likely benign cyst  - Unclear etiology, although had been on methotrexate - Large ascites and worsening renal function concerning for hepatorenal syndrome, see below - Pt is s/p large volume paracentesis  yielding 4.5L on 7/22 - Liver US performed: 1. Hepatic cirrhosis. Moderate volume ascites, most prominent in the right lower quadrant. 2. Minimal to and fro flow within the main portal and splenic veins. No discrete thrombosis appreciated. 3. Slow hepatopetal flow within the right and left portal veins. 4. Patent paraumbilical varix with hepatofugal flow.      - Initial plan for liver biopsy this admit, however this had remained on hold and is unlikely to change management.      -GI signed off as of 8/13  Chronic diarrhea - gliadin antibodies - wnl, TTG - normal - Initially resolved with colestid BID, imodium, and creon - Colestid stopped 7/31 as it may contribute to nausea, also to get a better assessment of diarrhea - Entocort was started 7/31with improvement noted, presently stable  Community Acquired Pneumonia - Bld Cx Neg  - Completed course of Ceftriaxone/azithro x 5 days - Presently stable  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 - Concern for hepatorenal syndrome - Baseline Cr around 1.5 - SCr stable at 1.87. BUN of 110. Nephrology following     - Continue to hold diuretics per Nephrology with outpatient Nephrology follow up in one month  Hypotension/Hx of Hypertension - Beta blocker was resumed. Continue to hold norvasc given concerns of swelling. Will avoid ACEI/ARB given AKI    -BP remains poorly controlled. Have started low dose PO hydralazine with PRN IV hydralazine  Non- AG acidosis - resolved  N/V - continued with Zofran 4mg  iv q6h prn - Colestid recently stopped by GI as it may contribute to nausea - presently stable currently -on marinol; continue to encourage PO intake. Still with minimal PO intake despite her husband  bringing food in from home  Hypothyroidism - Cont Levothyroxine 88 micrograms po qday as tolerated     - currently stable  Anxiety - Cont Lexapro 10mg  po qday as pt  tolerates - Flat affect this AM  Normocytic Anemia - stable, no frank bleed reported - Hgb of 6.7 on 8/7, s/p 2 units of PRBC with appropriate correction     - Hgb today 10.8   RA  - Currently off Humira, follows with Deveshwar  - Will continue with Voltaren gel as needed  Pulmonary Nodule - Outpatient follow up recommended when stable  Hypoglycemia - Remains stable, cont to encourage PO intake  Hypomagnesemia/Hypophospatemia - Thus far stable  Debility - PT recs for SNF. Given global decline Palliative Care following     - SW following for SNF placement   Toxic metabolic encephalopathy - resolved  Anasarca - BLE edema and signs of volume overload - 2d echo reviewed, normal LVEF - Suspect anasarca related to marked malnutrition per above     - Presently maintained on TPN per below     - Improved with diuresis  Severe protein calorie malnutrition - anorexia for several months prior to admit noted - Likely contributing to anasarca above - Unable to tolerate enteric feeding, now with TPN - Had recently discussed with GI. GI had recommended continuing TPN, even on discharge to SNF, with plan of eventual weaning down the road     - Pt is continued on marinol     - Seems to be stable at this time  End of Life - Appreciate input by [place] for now is to continue current full scope of tx -family is aware of poor long term prognosis. Pending SNF placement  DVT prophylaxis: SCD's Code Status: Full Family Communication: Pt in room, family at bedside Disposition Plan: Uncertain at this time  Consultants:   GI  Nephrology  Palliative Care  Procedures:   Colonoscopy 7/19  EGD 7/31  Antimicrobials: Anti-infectives (From admission, onward)   Start     Dose/Rate Route Frequency Ordered Stop   01/07/19 1700  cefTRIAXone (ROCEPHIN) 2 g in sodium chloride 0.9 % 100 mL IVPB      2 g 200 mL/hr over 30 Minutes Intravenous Every 24 hours 01/07/19 1557 01/11/19 1908   01/07/19 1700  azithromycin (ZITHROMAX) 500 mg in sodium chloride 0.9 % 250 mL IVPB     500 mg 250 mL/hr over 60 Minutes Intravenous Every 24 hours 01/07/19 1557 01/11/19 2014      Subjective: Remains with decreased Appetitie  Objective: Vitals:   02/07/19 0514 02/07/19 0547 02/07/19 0819 02/07/19 1022  BP: (!) 168/77  (!) 165/77 (!) 179/87  Pulse: 68  62 63  Resp: 18  20   Temp: 98.7 F (37.1 C)  98.7 F (37.1 C)   TempSrc: Oral  Oral   SpO2: 92%  95%   Weight:  69.1 kg    Height:        Intake/Output Summary (Last 24 hours) at 02/07/2019 1445 Last data filed at 02/07/2019 1400 Gross per 24 hour  Intake 1446.29 ml  Output 1750 ml  Net -303.71 ml   Filed Weights   02/05/19 0522 02/06/19 0648 02/07/19 0547  Weight: 70.3 kg 70.7 kg 69.1 kg    Examination: General exam: Awake, laying in bed, in nad Respiratory system: Normal respiratory effort, no wheezing Cardiovascular system: regular rate, s1, s2 Gastrointestinal system: Soft, nondistended, positive BS Central nervous system: CN2-12 grossly intact,  strength intact Extremities: Perfused, no clubbing Skin: Normal skin turgor, no notable skin lesions seen Psychiatry: Mood normal // no visual hallucinations   Data Reviewed: I have personally reviewed following labs and imaging studies  CBC: Recent Labs  Lab 02/07/19 0518  WBC 9.5  NEUTROABS 7.7  HGB 10.8*  HCT 35.5*  MCV 104.1*  PLT 916*   Basic Metabolic Panel: Recent Labs  Lab 02/02/19 0600 02/03/19 0326 02/04/19 0434 02/05/19 0411 02/06/19 0357 02/07/19 0518  NA 144 144 145 147* 148* 151*  K 4.5 3.7 3.9 3.9 3.8 3.8  CL 99 101 102 105 106 110  CO2 32 31 32 31 30 28   GLUCOSE 83 104* 108* 104* 103* 110*  BUN 95* 108* 113* 114* 110* 96*  CREATININE 2.09* 1.99* 2.01* 2.06* 1.87* 1.79*  CALCIUM 9.5 9.3 9.3 9.6 9.6 9.6  MG 2.8* 2.5* 2.5*  --  2.3 2.4  PHOS 4.8*  3.6 3.7 4.1 4.0 4.2   GFR: Estimated Creatinine Clearance: 27.1 mL/min (A) (by C-G formula based on SCr of 1.79 mg/dL (H)). Liver Function Tests: Recent Labs  Lab 02/03/19 0326 02/04/19 0434 02/05/19 0411 02/06/19 0357 02/07/19 0518  AST 106* 107* 101* 105* 95*  ALT 57* 66* 67* 70* 70*  ALKPHOS 95 103 101 107 101  BILITOT 1.3* 1.4* 1.6* 1.4* 1.6*  PROT 5.8* 5.7* 6.2* 6.0* 6.2*  ALBUMIN 3.8 3.7 4.0 3.7 3.7   No results for input(s): LIPASE, AMYLASE in the last 168 hours. Recent Labs  Lab 02/05/19 1538  AMMONIA 21   Coagulation Profile: No results for input(s): INR, PROTIME in the last 168 hours. Cardiac Enzymes: No results for input(s): CKTOTAL, CKMB, CKMBINDEX, TROPONINI in the last 168 hours. BNP (last 3 results) No results for input(s): PROBNP in the last 8760 hours. HbA1C: No results for input(s): HGBA1C in the last 72 hours. CBG: Recent Labs  Lab 01/31/19 1550 02/01/19 0044 02/01/19 0718 02/01/19 1607 02/02/19 2138  GLUCAP 135* 136* 110* 142* 112*   Lipid Profile: Recent Labs    02/07/19 0518  TRIG 78   Thyroid Function Tests: No results for input(s): TSH, T4TOTAL, FREET4, T3FREE, THYROIDAB in the last 72 hours. Anemia Panel: No results for input(s): VITAMINB12, FOLATE, FERRITIN, TIBC, IRON, RETICCTPCT in the last 72 hours. Sepsis Labs: No results for input(s): PROCALCITON, LATICACIDVEN in the last 168 hours.  No results found for this or any previous visit (from the past 240 hour(s)).   Radiology Studies: No results found.  Scheduled Meds: . budesonide  9 mg Oral Daily  . calcium carbonate  500 mg of elemental calcium Oral BID   And  . cholecalciferol  200 Units Oral BID  . dronabinol  2.5 mg Oral BID AC  . escitalopram  10 mg Oral Daily  . levothyroxine  88 mcg Oral Q0600  . metoprolol tartrate  25 mg Oral BID  . ondansetron  4 mg Intravenous Q8H  . pantoprazole  40 mg Oral BID   Continuous Infusions: . TPN ADULT (ION) 40 mL/hr at  02/07/19 1000  . TPN ADULT (ION)       LOS: 31 days   Marylu Lund, MD [facility] Pager On Amion  If 7PM-7AM, please contact night-coverage 02/07/2019, 2:45 PM

## 2019-02-07 NOTE — Progress Notes (Signed)
[place] NOTE   Pharmacy Consult for TPN Indication: intolerance to enteral feeding (refusal to place NGT)   Patient Measurements: Height: 5' 1" (154.9 cm) Weight: 152 lb 5.4 oz (69.1 kg) IBW/kg (Calculated) : 47.8 TPN AdjBW (KG): 53.8 Body mass index is 28.78 kg/m. Usual Weight: ~70 kg  HPI: 64 yoF with PMH HTN, HLD, COPD, asthma, RA previously on immune modulators, h/o necrotizing glomerulonephritis/vasculitis (+ ANCA), and pancytopenia secondary to cyclophosphamide. She presented to the ED with complaints of chronic diarrhea, intermittent n/v, and some blood noted in stool. During 07/2018 admission in [place], EGD/colonoscopy showed an ulcerated mass-like lesion in the ileocecal valve with satellite ulcers. 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 New diagnosis of cirrhosis this admission with portal HTN and severe ascites.  EGD here still shows duodenal ulcer and distal esophagitis; very poor PO intake. Cortrak feeding tube placed 8/3 but subsequently dislodged. Patient did not tolerate replacement of NGT and now refusing any further attempt at replacement. Pharmacy consulted to dose TPN. Per RD, considered at risk for refeeding  Central access: PICC placed 7/31 TPN start date: 8/6  Significant events:  - 8/8: phosphorus remains low, will not bolus with fluid overload, will increase in TPN, will not advance TPN rate today. Conisidering dialysis if intake, fluid volume not improved, Renal following. - 8/10: concentrating TPN to 65 ml/hr, same total nutrition as with previous formulation at 85 ml/hr  ASSESSMENT                                                                                                           Today, 02/07/2019:  Glucose (CBG goal 100-150) no longer checking routine CBGs; SBG WNL  Systemic absorption of Entocort is low (< 20%) so expect minimal effect on CBGs  Electrolytes -Na 151-  sl elevated.  other lytes WNL  Renal - AoCKD d/t hepatorenal syndrome; SCr now stable/declining  but remains elevated (baseline appears to be ~1.5 so not yet back to normal); BUN elevated at 96;  LFTs - AST/ALT still elevated but stable; Alk Phos stable WNL, Tbili remains slightly elevated; albumin WNL (iatrogenic d/t ascites treatment)  TGs - WNL  Prealbumin - improved to WNL (8/17)  Current Nutrition - salt-restricted diet, partial intake (charted as 10-25 % eaten over last few days)  IVF - none d/t overload  NUTRITIONAL GOALS                                                                                              RD recs (8/6): Kcal:  2000-2200 kcal Protein:  100-115 grams Fluid:  >/= 2.5 L/day  PLAN  Continues on Tums/Vit D per MD  At 1800 today:  TPN rate decreased to 40 ml/hr on 8/13 d/t difficulty determining if TPN is the cause of rising BUN and LFTs. Continue current TPN at 40 ml/hr  Concentrated TPN at goal rate of 60 ml/hr provides 100 g of protein and 2037 kcals, meeting 100% of patient needs  Utilizing concentrated (15%) amino acids  Add MVI daily and trace elements MWF (due to national shortage)  Electrolytes: continue to hold Mag- may add back tomorrow; others unchanged from yesterday; Cl:Ac = max Cl  IVF (none currently) per MD - avoiding d/t ascites/HRS  No longer checking routine CBGs  BMet with Mag and Phos tomorrow  TPN lab panels on Mondays & Thursdays  Per MD note 8/15 " Discussed with GI. Plan to continue TPN, even with discharge to SNF, with eventual weaning down the road"  Dolly Rias RPh 02/07/2019, 8:31 AM

## 2019-02-07 NOTE — Progress Notes (Signed)
CSW received a call from AMY with [facility] who called with the auth # [id number] for pt's ambulance transport home at D/C and Amy also states that a copy has been sent to the UR Dept, [facility], and at the CSW's request the Amy will fax a copy to the [place] (this Probation officer) at [phone] so that a copy can be placed on the CSW's desk with the [place] # for 8/18.  2nd shift ED CSW will leave handoff for 1st shift ED CSW.  CSW will continue to follow for D/C needs.  Alphonse Guild. Trinton Prewitt, LCSW, LCAS, CSI Transitions of Care Clinical Social Worker Care Coordination Department Ph: [phone]

## 2019-02-07 NOTE — Progress Notes (Signed)
PT Cancellation Note  Patient Details Name: Kaylee Rasmussen MRN: [id number] DOB: [birth year]   Cancelled Treatment:    Reason Eval/Treat Not Completed: Patient at procedure or test/unavailable - Pt being swabbed for COVID 19 prior to SNF admission. RN requesting PT check back in 70 minutes.   Julien Girt, PT Acute Rehabilitation Services Pager [phone]  Office [phone]    [name] [name] 02/07/2019, 11:55 AM

## 2019-02-07 NOTE — Progress Notes (Signed)
Patients urine is dark red/bloody through the external cathter. Placement checked and no blood seen around patients perineal area. Patient does not complain of any pain or discomfort in the area. Prior to this her urine was clear and yellow. Lamar Blinks NP notified, as well as Jeannene Patella, RN who is the Soil scientist.

## 2019-02-08 ENCOUNTER — Inpatient Hospital Stay (HOSPITAL_COMMUNITY): Payer: PPO

## 2019-02-08 LAB — BODY FLUID CELL COUNT WITH DIFFERENTIAL
Eos, Fluid: 1 %
Lymphs, Fluid: 77 %
Monocyte-Macrophage-Serous Fluid: 21 % — ABNORMAL LOW (ref 50–90)
Neutrophil Count, Fluid: 1 % (ref 0–25)
Total Nucleated Cell Count, Fluid: 587 cu mm (ref 0–1000)

## 2019-02-08 LAB — PROTEIN, PLEURAL OR PERITONEAL FLUID: Total protein, fluid: <3 g/dL

## 2019-02-08 LAB — BASIC METABOLIC PANEL
Anion gap: 10 (ref 5–15)
BUN: 105 mg/dL — ABNORMAL HIGH (ref 8–23)
CO2: 26 mmol/L (ref 22–32)
Calcium: 9.3 mg/dL (ref 8.9–10.3)
Chloride: 111 mmol/L (ref 98–111)
Creatinine, Ser: 1.65 mg/dL — ABNORMAL HIGH (ref 0.44–1.00)
GFR calc Af Amer: 37 mL/min — ABNORMAL LOW (ref >60)
GFR calc non Af Amer: 32 mL/min — ABNORMAL LOW (ref >60)
Glucose, Bld: 98 mg/dL (ref 70–99)
Potassium: 4.2 mmol/L (ref 3.5–5.1)
Sodium: 147 mmol/L — ABNORMAL HIGH (ref 135–145)

## 2019-02-08 LAB — URINALYSIS, ROUTINE W REFLEX MICROSCOPIC
Bilirubin Urine: NEGATIVE
Glucose, UA: NEGATIVE mg/dL
Ketones, ur: NEGATIVE mg/dL
Leukocytes,Ua: NEGATIVE
Nitrite: NEGATIVE
Protein, ur: NEGATIVE mg/dL
Specific Gravity, Urine: 1.013 (ref 1.005–1.030)
pH: 5 (ref 5.0–8.0)

## 2019-02-08 LAB — ALBUMIN, PLEURAL OR PERITONEAL FLUID: Albumin, Fluid: 1.4 g/dL

## 2019-02-08 LAB — PHOSPHORUS: Phosphorus: 4.4 mg/dL (ref 2.5–4.6)

## 2019-02-08 LAB — MAGNESIUM: Magnesium: 2.3 mg/dL (ref 1.7–2.4)

## 2019-02-08 MED ORDER — ALBUMIN HUMAN 25 % IV SOLN
25.0000 g | Freq: Once | INTRAVENOUS | Status: DC
  Filled 2019-02-08: qty 100

## 2019-02-08 MED ORDER — STERILE WATER FOR INJECTION IV SOLN
INTRAVENOUS | Status: AC
  Administered 2019-02-08: 18:00:00 via INTRAVENOUS
  Filled 2019-02-08: qty 448

## 2019-02-08 MED ORDER — LIDOCAINE HCL 1 % IJ SOLN
INTRAMUSCULAR | Status: AC
  Filled 2019-02-08: qty 10

## 2019-02-08 NOTE — Progress Notes (Signed)
Physical Therapy Treatment Patient Details Name: Kaylee Rasmussen MRN: [id number] DOB: 1950/12/16 Today's Date: 02/08/2019    History of Present Illness 68 yo female admitted to ED 7/16 with rectal bleeding, CT abdomen pelvis revealing ? liver cirrhosis, ascites, small pleural effusions. Colonoscopy 7/19 reveals internal and external hemorrhoids, diverticulosis. 7/22 paracentesis yielding 4.5L clear yellow fluid. PMH includes HTN, HLD, asthma, RA, necrotizing glomerulonephritis/vasculitis, pancytopenia, chronic diarrhea with N/V.    PT Comments    Pt much more lethargic and less responsive today, but was agreeable to sitting at EOB and standing with RW.   Pt only able to stand x 1 rep with +2 A needed for all aspects of mobility.  Note that she is to have 2 more tests run today which will hopefully be telling as to new status.    Follow Up Recommendations  SNF     Equipment Recommendations  None recommended by PT    Recommendations for Other Services       Precautions / Restrictions Precautions Precautions: Fall Restrictions Weight Bearing Restrictions: No    Mobility  Bed Mobility Overal bed mobility: Needs Assistance Bed Mobility: Sit to Supine;Supine to Sit     Supine to sit: +2 for physical assistance Sit to supine: +2 for physical assistance   General bed mobility comments: required increased assist today as pt was very lethargic  Transfers Overall transfer level: Needs assistance Equipment used: Rolling walker (2 wheeled) Transfers: Sit to/from Stand Sit to Stand: +2 physical assistance         General transfer comment: Pt only able to perform single stand from elevated bed to RW with PT/tech assisting to block knees to prevent forward translation and assist at hips to elevate to standing.  Provided max encouragement to stand again, however pt not willing.  Ambulation/Gait                 Stairs             Wheelchair Mobility    Modified  Rankin (Stroke Patients Only)       Balance Overall balance assessment: Needs assistance Sitting-balance support: Feet supported;Bilateral upper extremity supported Sitting balance-Leahy Scale: Poor     Standing balance support: Bilateral upper extremity supported;During functional activity Standing balance-Leahy Scale: Zero Standing balance comment: pt relies on UE support                            Cognition Arousal/Alertness: Lethargic Behavior During Therapy: Flat affect                                   General Comments: Pt very lethargic and slow to respond during session.  She did become a little more alert once at EOB, still requires max encouragement to participate.      Exercises      General Comments        Pertinent Vitals/Pain Pain Assessment: Faces Faces Pain Scale: Hurts little more Pain Location: abdomen Pain Descriptors / Indicators: Discomfort Pain Intervention(s): Monitored during session    Home Living                      Prior Function            PT Goals (current goals can now be found in the care plan section) Acute Rehab PT Goals Patient Stated Goal: go  home, get better PT Goal Formulation: With patient Time For Goal Achievement: 02/15/19 Potential to Achieve Goals: Fair Progress towards PT goals: Not progressing toward goals - comment(pt extremely lethargic and less responsive during session)    Frequency    Min 2X/week      PT Plan Current plan remains appropriate    Co-evaluation              AM-PAC PT "6 Clicks" Mobility   Outcome Measure  Help needed turning from your back to your side while in a flat bed without using bedrails?: Total Help needed moving from lying on your back to sitting on the side of a flat bed without using bedrails?: Total Help needed moving to and from a bed to a chair (including a wheelchair)?: Total Help needed standing up from a chair using your arms  (e.g., wheelchair or bedside chair)?: Total Help needed to walk in hospital room?: Total Help needed climbing 3-5 steps with a railing? : Total 6 Click Score: 6    End of Session Equipment Utilized During Treatment: Oxygen Activity Tolerance: Patient limited by lethargy;Patient limited by fatigue Patient left: in bed;with call bell/phone within reach;with family/visitor present Nurse Communication: Mobility status PT Visit Diagnosis: Other abnormalities of gait and mobility (R26.89);Muscle weakness (generalized) (M62.81);History of falling (Z91.81)     Time: 1335-1400 PT Time Calculation (min) (ACUTE ONLY): 25 min  Charges:  $Therapeutic Activity: 23-37 mins                     Harriet Butte, PT, MPT  02/08/19, 2:39 PM    Jennalyn Cawley, Meribeth Mattes 02/08/2019, 2:38 PM

## 2019-02-08 NOTE — Progress Notes (Signed)
Pt. Urine has reverted back to yellow/straw and clear. Will continue to monitor.

## 2019-02-08 NOTE — Procedures (Signed)
Ultrasound-guided diagnostic  paracentesis performed yielding 100 cc of yellow fluid. No immediate complications. The fluid was sent to the lab for preordered studies. Only a trace amount of ascites was present on today's scan. EBL < 1cc.

## 2019-02-08 NOTE — Progress Notes (Signed)
Palliative Care Progress Note  I met with Kaylee Rasmussen this evening to further discuss goals of care. There was no family at the bedside. She welcomed my visit even without her family present. We began discussing planning for her future and the serious nature of her health condition as well as her overall level of suffering. 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  Both HSV esophagitis and CMV colitis are extremely painful conditons associated with intense cramping, spasm of the bowel and painful swallowing and painful bowel movements. Understandably she has a complete aversion to oral intake and there is likely a large component of anticipatory pain that she has "hard-wired" now with eating.  Additionally anxiety is a trigger for her abdominal apin and spasm- as we were talking she experienced a severe abdominal spasm and appeared to be in extreme discomfort- this lasted for about 5 minutes and then subsided-but she was very distressed by this and says it has been going on for months and it is debilitating.  She needs a parenteral or transdermal opioid delivery- I have changed her orders to include IV dilaudid q2 prn and then we can consider transitioning her to a duragesic patch. She most certainly cannot rehab in her current condition and I am concerned about a discharge plan that does not include at minimum palliative care to follow.   She was not able to talk to me about her code status or ACP although this will be extremely important for her to discuss with family present. She tells me she is scared of dying and also fearful of how much worse her condition and pain could possible get in the future.   Today her urine is  dark red and this is new. Concerning for recurrence of GN.  Recommend continued acute stay for IV pain medication, work up of hematuria and ongoing goals of care discussion.  Lane Hacker, DO Palliative Medicine [phone]  Time: 35 min Greater than 50%  of this time was spent counseling and coordinating care related to the above assessment and plan.

## 2019-02-08 NOTE — Progress Notes (Signed)
[facility] NOTE   Pharmacy Consult for TPN Indication: intolerance to enteral feeding (refusal to place NGT)   Patient Measurements: Height: '5\' 1"'$  (154.9 cm) Weight: 151 lb 8 oz (68.7 kg) IBW/kg (Calculated) : 47.8 TPN AdjBW (KG): 53.8 Body mass index is 28.63 kg/m. Usual Weight: ~70 kg  HPI: 68 yoF with PMH HTN, HLD, COPD, asthma, RA previously on immune modulators, h/o necrotizing glomerulonephritis/vasculitis (+ ANCA), and pancytopenia secondary to cyclophosphamide. She presented to the ED with complaints of chronic diarrhea, intermittent n/v, and some blood noted in stool. During 07/2018 admission in [place], EGD/colonoscopy showed an ulcerated mass-like lesion in the ileocecal valve with satellite ulcers. 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 New diagnosis of cirrhosis this admission with portal HTN and severe ascites.  EGD here still shows duodenal ulcer and distal esophagitis; very poor PO intake. Cortrak feeding tube placed 8/3 but subsequently dislodged. Patient did not tolerate replacement of NGT and now refusing any further attempt at replacement. Pharmacy consulted to dose TPN. Per RD, considered at risk for refeeding  Central access: PICC placed 7/31 TPN start date: 8/6  Significant events:  - 8/8: phosphorus remains low, will not bolus with fluid overload, will increase in TPN, will not advance TPN rate today. Conisidering dialysis if intake, fluid volume not improved, Renal following. - 8/10: concentrating TPN to 65 ml/hr, same total nutrition as with previous formulation at 85 ml/hr  ASSESSMENT                                                                                                           Today, 02/08/2019:  Glucose (CBG goal 100-150) no longer checking routine CBGs; SBG WNL  Systemic absorption of Entocort is low (< 20%) so expect minimal effect on CBGs  Electrolytes -Na 147- sl  elevated.  other lytes WNL  Renal - AoCKD d/t hepatorenal syndrome; SCr now stable/declining  but remains elevated (baseline appears to be ~1.5 so not yet back to normal); BUN elevated at 105;  LFTs - AST/ALT still elevated but stable; Alk Phos stable WNL, Tbili remains slightly elevated; albumin WNL (iatrogenic d/t ascites treatment) (8/17)  TGs - WNL (8/17)  Prealbumin - improved to WNL (8/17)  Current Nutrition - salt-restricted diet, partial intake (charted as 10-25 % eaten over last few days)  IVF - none d/t overload  NUTRITIONAL GOALS                                                                                              RD recs (8/6): Kcal:  2000-2200 kcal Protein:  100-115 grams Fluid:  >/= 2.5 L/day  PLAN                                                                                                                          Continues on Tums/Vit D per MD  At 1800 today:  TPN rate decreased to 40 ml/hr on 8/13 d/t difficulty determining if TPN is the cause of rising BUN and LFTs. Continue current TPN at 40 ml/hr  Concentrated TPN at goal rate of 60 ml/hr provides 100 g of protein and 2037 kcals, meeting 100% of patient needs  Utilizing concentrated (15%) amino acids  Add MVI daily and trace elements MWF (due to national shortage)  Electrolytes: continue to hold Mag- may add back tomorrow; others unchanged from yesterday; Cl:Ac = max Cl  IVF (none currently) per MD - avoiding d/t ascites/HRS  No longer checking routine CBGs  TPN lab panels on Mondays & Thursdays  Per MD note 8/15 " Discussed with GI. Plan to continue TPN, even with discharge to SNF, with eventual weaning down the road"  Dolly Rias RPh 02/08/2019, 10:14 AM

## 2019-02-08 NOTE — Progress Notes (Signed)
PROGRESS NOTE    Kaylee Rasmussen  OEV:[id number] DOB: [birth year] DOA: 01/06/2019 PCP: Nicoletta Dress, MD    Brief Narrative:  68 y.o.female,hypertension, hyperlipidemia, Asthma, Rheumatoid arthritis (formerly on humira->CMV colitis, HSV esophagitis February 2020), h/o necrotizing glomerulonephritis/ vasculitis(+ ANCA), pancytopenia secondary to cyclophosphamide , has c/o chronic diarrhea and some intermittent n/v, and apparently noticed some blood in stool and told to come to ER for evaluation by GI. She was admitted for blood in stool and chronic diarrhea and abnormal liver function. She's now s/p colonoscopy with GI  Assessment & Plan:   Principal Problem:   ARF (acute renal failure) (HCC) Active Problems:   Rheumatoid arthritis involving multiple sites with positive rheumatoid factor (HCC)   History of hypertension   History of hypothyroidism   Abnormal liver function   Diarrhea   Pulmonary nodule   Metabolic acidosis   Severe protein-calorie malnutrition (HCC)   Rectal bleeding   LFTs abnormal   Cirrhosis (HCC)   Pancreatic insufficiency   Ascites of liver   AKI (acute kidney injury) ([place])  Blood in stool, CMV colitis - Colonoscopy this admit was performed with findings of localized inflammation at ileocecal valve, external and internal hemorrhoids; see report -CMV found to be negative - No frank bleeding noted. Pt did receive 2 units PRBC tx on 8/7 for hgb of 6.7, see below     - Pt remains hemodynamically stable. Most recent hgb remained stable at 10.8. GI has since signed off as of 8/13  Abnormal liver function with hepatorenal syndrome - MRCP this admit demonstrated severe diffuse hepatic steatosis (can't exclude cirrhosis) - solitary subcentimeter R liver lesion, likely benign cyst  - Unclear etiology, although had been on methotrexate - Pt noted to have a large ascites and worsening renal function concerning for hepatorenal  syndrome, see below - Pt is s/p large volume paracentesis yielding 4.5L on 7/22 - Liver US performed: 1. Hepatic cirrhosis. Moderate volume ascites, most prominent in the right lower quadrant. 2. Minimal to and fro flow within the main portal and splenic veins. No discrete thrombosis appreciated. 3. Slow hepatopetal flow within the right and left portal veins. 4. Patent paraumbilical varix with hepatofugal flow.      - GI had initially planned for liver biopsy this admit, however this had remained on hold will likely no longer change current management.      -GI signed off as of 8/13      -Overnight, pt reported increased abd pain with confusion. Have requested diagnostic US guided paracentesis to r/o SBP  Chronic diarrhea - gliadin antibodies - wnl, TTG - normal - Initially resolved with colestid BID, imodium, and creon - Colestid was stopped 7/31 as it may contribute to nausea, also to get a better assessment of diarrhea - Entocort was started 7/31with no significant stool out put noted.  Community Acquired Pneumonia - Bld Cx Neg  - Completed course of Ceftriaxone/azithro x 5 days - Currently stable  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 - Concern for hepatorenal syndrome - Baseline Cr around 1.5 - SCr stable at 1.65. BUN of 98. Nephrology following     - Continue to hold diuretics per Nephrology with outpatient Nephrology follow up in one month  Hypotension/Hx of Hypertension - Beta blocker was resumed. Continue to hold norvasc given concerns of swelling. Will avoid ACEI/ARB given AKI    -BP stable currently on low dose PO hydralazine with PRN IV hydralazine  Non- AG acidosis - resolved  N/V -  continued with Zofran 4mg  iv q6h prn - Colestid recently stopped by GI as it may contribute to nausea - presently stable currently -on marinol; continue to encourage PO intake. Still with minimal PO intake despite her  husband bringing food in from home  Hypothyroidism - Cont Levothyroxine 88 micrograms po qday as tolerated     - currently stable  Anxiety - Cont Lexapro 10mg  po qday as pt tolerates - Flat affect this AM  Normocytic Anemia - stable, no frank bleed reported - Hgb of 6.7 on 8/7, s/p 2 units of PRBC with appropriate correction     - Hgb today 10.8   RA  - Currently off Humira, follows with Deveshwar  - Pt is continued on Voltaren gel as needed  Pulmonary Nodule - Outpatient follow up recommended when stable  Hypoglycemia - Remains stable, cont to encourage PO intake  Hypomagnesemia/Hypophospatemia - Thus far stable  Debility - PT recs for SNF. Given global decline Palliative Care following     - SW following      - Discussed with Palliative Care. Cont with Goals of Care per Pallaitive   Toxic metabolic encephalopathy - seems more confused this AM     - UA reviewed, clear     - Have ordered cxr to r/o PNA and requested diagnostic paracentesis to r/o SBP     - Afebrile  Anasarca - 2d echo reviewed, normal LVEF - Suspect anasarca related to marked malnutrition per above     - Presently maintained on TPN per below     - Edema much improved following lasix diuresis  Severe protein calorie malnutrition - anorexia for several months prior to admit noted - Likely contributing to anasarca above - Unable to tolerate enteric feeding, now with TPN - Had recently discussed with GI. GI had recommended continuing TPN, even on discharge to SNF, with plan of eventual weaning down the road     - Pt is continued on marinol     - still not tolerating PO intake  End of Life - Appreciate input by Palliative Care -Plan for now is to continue current full scope of tx -family is aware of poor long term prognosis. - Follow up with Palliative Care  DVT prophylaxis: SCD's Code Status: Full Family  Communication: Pt in room, family at bedside Disposition Plan: Uncertain at this time  Consultants:   GI  Nephrology  Palliative Care  Procedures:   Colonoscopy 7/19  EGD 7/31  Antimicrobials: Anti-infectives (From admission, onward)   Start     Dose/Rate Route Frequency Ordered Stop   01/07/19 1700  cefTRIAXone (ROCEPHIN) 2 g in sodium chloride 0.9 % 100 mL IVPB     2 g 200 mL/hr over 30 Minutes Intravenous Every 24 hours 01/07/19 1557 01/11/19 1908   01/07/19 1700  azithromycin (ZITHROMAX) 500 mg in sodium chloride 0.9 % 250 mL IVPB     500 mg 250 mL/hr over 60 Minutes Intravenous Every 24 hours 01/07/19 1557 01/11/19 2014      Subjective: Flat affect, not wanting to eat this AM. Breakfast tray is essentially untouched  Objective: Vitals:   02/08/19 0513 02/08/19 1340 02/08/19 1420 02/08/19 1450  BP:   (!) 144/62   Pulse:  62 68   Resp:  19    Temp:  98.4 F (36.9 C)    TempSrc:  Oral    SpO2:    94%  Weight: 68.7 kg     Height:  Intake/Output Summary (Last 24 hours) at 02/08/2019 1630 Last data filed at 02/08/2019 1507 Gross per 24 hour  Intake 952.4 ml  Output 850 ml  Net 102.4 ml   Filed Weights   02/06/19 0648 02/07/19 0547 02/08/19 0513  Weight: 70.7 kg 69.1 kg 68.7 kg    Examination: General exam: Conversant, in no acute distress Respiratory system: normal chest rise, clear, no audible wheezing Cardiovascular system: regular rhythm, s1-s2 Gastrointestinal system: mildly distended,, pos BS Central nervous system: No seizures, no tremors Extremities: No cyanosis, no joint deformities Skin: No rashes, no pallor Psychiatry: affect flat // somewhat confused  Data Reviewed: I have personally reviewed following labs and imaging studies  CBC: Recent Labs  Lab 02/07/19 0518  WBC 9.5  NEUTROABS 7.7  HGB 10.8*  HCT 35.5*  MCV 104.1*  PLT 841*   Basic Metabolic Panel: Recent Labs  Lab 02/03/19 0326 02/04/19 0434 02/05/19 0411  02/06/19 0357 02/07/19 0518 02/08/19 0411  NA 144 145 147* 148* 151* 147*  K 3.7 3.9 3.9 3.8 3.8 4.2  CL 101 102 105 106 110 111  CO2 31 32 31 30 28 26   GLUCOSE 104* 108* 104* 103* 110* 98  BUN 108* 113* 114* 110* 96* 105*  CREATININE 1.99* 2.01* 2.06* 1.87* 1.79* 1.65*  CALCIUM 9.3 9.3 9.6 9.6 9.6 9.3  MG 2.5* 2.5*  --  2.3 2.4 2.3  PHOS 3.6 3.7 4.1 4.0 4.2 4.4   GFR: Estimated Creatinine Clearance: 29.4 mL/min (A) (by C-G formula based on SCr of 1.65 mg/dL (H)). Liver Function Tests: Recent Labs  Lab 02/03/19 0326 02/04/19 0434 02/05/19 0411 02/06/19 0357 02/07/19 0518  AST 106* 107* 101* 105* 95*  ALT 57* 66* 67* 70* 70*  ALKPHOS 95 103 101 107 101  BILITOT 1.3* 1.4* 1.6* 1.4* 1.6*  PROT 5.8* 5.7* 6.2* 6.0* 6.2*  ALBUMIN 3.8 3.7 4.0 3.7 3.7   No results for input(s): LIPASE, AMYLASE in the last 168 hours. Recent Labs  Lab 02/05/19 1538  AMMONIA 21   Coagulation Profile: No results for input(s): INR, PROTIME in the last 168 hours. Cardiac Enzymes: No results for input(s): CKTOTAL, CKMB, CKMBINDEX, TROPONINI in the last 168 hours. BNP (last 3 results) No results for input(s): PROBNP in the last 8760 hours. HbA1C: No results for input(s): HGBA1C in the last 72 hours. CBG: Recent Labs  Lab 02/02/19 2138  GLUCAP 112*   Lipid Profile: Recent Labs    02/07/19 0518  TRIG 78   Thyroid Function Tests: No results for input(s): TSH, T4TOTAL, FREET4, T3FREE, THYROIDAB in the last 72 hours. Anemia Panel: No results for input(s): VITAMINB12, FOLATE, FERRITIN, TIBC, IRON, RETICCTPCT in the last 72 hours. Sepsis Labs: No results for input(s): PROCALCITON, LATICACIDVEN in the last 168 hours.  Recent Results (from the past 240 hour(s))  SARS CORONAVIRUS 2 Nasal Swab Aptima Multi Swab     Status: None   Collection Time: 02/07/19 11:27 AM   Specimen: Aptima Multi Swab; Nasal Swab  Result Value Ref Range Status   SARS Coronavirus 2 NEGATIVE NEGATIVE Final     Comment: (NOTE) SARS-CoV-2 target nucleic acids are NOT DETECTED. The SARS-CoV-2 RNA is generally detectable in upper and lower respiratory specimens during the acute phase of infection. Negative results do not preclude SARS-CoV-2 infection, do not rule out co-infections with other pathogens, and should not be used as the sole basis for treatment or other patient management decisions. Negative results must be combined with clinical observations, patient history, and  epidemiological information. The expected result is Negative. Fact Sheet for Patients: SugarRoll.be Fact Sheet for Healthcare Providers: https://www.woods-mathews.com/ This test is not yet approved or cleared by the [place] FDA and  has been authorized for detection and/or diagnosis of SARS-CoV-2 by FDA under an Emergency Use Authorization (EUA). This EUA will remain  in effect (meaning this test can be used) for the duration of the COVID-19 declaration under Section 56 4(b)(1) of the Act, 21 U.S.C. section 360bbb-3(b)(1), unless the authorization is terminated or revoked sooner. Performed at [facility], [place] [address], [place], [place] [address]      Radiology Studies: No results found.  Scheduled Meds:  budesonide  9 mg Oral Daily   calcium carbonate  500 mg of elemental calcium Oral BID   And   cholecalciferol  200 Units Oral BID   dronabinol  2.5 mg Oral BID AC   escitalopram  10 mg Oral Daily   hydrALAZINE  10 mg Oral Q8H   levothyroxine  88 mcg Oral Q0600   metoprolol tartrate  25 mg Oral BID   ondansetron  4 mg Intravenous Q8H   pantoprazole  40 mg Oral BID   Continuous Infusions:  albumin human     TPN ADULT (ION) 40 mL/hr at 02/08/19 0420   TPN ADULT (ION)       LOS: 32 days   Marylu Lund, MD [facility] Pager On Amion  If 7PM-7AM, please contact night-coverage 02/08/2019, 4:30 PM

## 2019-02-09 ENCOUNTER — Inpatient Hospital Stay (HOSPITAL_COMMUNITY): Payer: PPO

## 2019-02-09 DIAGNOSIS — R0602 Shortness of breath: Secondary | ICD-10-CM

## 2019-02-09 DIAGNOSIS — I776 Arteritis, unspecified: Secondary | ICD-10-CM

## 2019-02-09 DIAGNOSIS — D690 Allergic purpura: Secondary | ICD-10-CM | POA: Diagnosis present

## 2019-02-09 DIAGNOSIS — J810 Acute pulmonary edema: Secondary | ICD-10-CM

## 2019-02-09 DIAGNOSIS — I7782 Antineutrophilic cytoplasmic antibody (ANCA) vasculitis: Secondary | ICD-10-CM

## 2019-02-09 LAB — CBC WITH DIFFERENTIAL/PLATELET
Abs Immature Granulocytes: 0.25 10*3/uL — ABNORMAL HIGH (ref 0.00–0.07)
Basophils Absolute: 0.1 10*3/uL (ref 0.0–0.1)
Basophils Relative: 1 %
Eosinophils Absolute: 0.1 10*3/uL (ref 0.0–0.5)
Eosinophils Relative: 1 %
HCT: 34 % — ABNORMAL LOW (ref 36.0–46.0)
Hemoglobin: 10.2 g/dL — ABNORMAL LOW (ref 12.0–15.0)
Immature Granulocytes: 2 %
Lymphocytes Relative: 7 %
Lymphs Abs: 0.7 10*3/uL (ref 0.7–4.0)
MCH: 32.3 pg (ref 26.0–34.0)
MCHC: 30 g/dL (ref 30.0–36.0)
MCV: 107.6 fL — ABNORMAL HIGH (ref 80.0–100.0)
Monocytes Absolute: 0.8 10*3/uL (ref 0.1–1.0)
Monocytes Relative: 7 %
Neutro Abs: 9.2 10*3/uL — ABNORMAL HIGH (ref 1.7–7.7)
Neutrophils Relative %: 82 %
Platelets: 127 10*3/uL — ABNORMAL LOW (ref 150–400)
RBC: 3.16 MIL/uL — ABNORMAL LOW (ref 3.87–5.11)
RDW: 21.8 % — ABNORMAL HIGH (ref 11.5–15.5)
WBC: 11.1 10*3/uL — ABNORMAL HIGH (ref 4.0–10.5)
nRBC: 0 % (ref 0.0–0.2)

## 2019-02-09 LAB — RENAL FUNCTION PANEL
Albumin: 4 g/dL (ref 3.5–5.0)
Anion gap: 9 (ref 5–15)
BUN: 94 mg/dL — ABNORMAL HIGH (ref 8–23)
CO2: 25 mmol/L (ref 22–32)
Calcium: 9.6 mg/dL (ref 8.9–10.3)
Chloride: 118 mmol/L — ABNORMAL HIGH (ref 98–111)
Creatinine, Ser: 1.59 mg/dL — ABNORMAL HIGH (ref 0.44–1.00)
GFR calc Af Amer: 39 mL/min — ABNORMAL LOW (ref >60)
GFR calc non Af Amer: 33 mL/min — ABNORMAL LOW (ref >60)
Glucose, Bld: 111 mg/dL — ABNORMAL HIGH (ref 70–99)
Phosphorus: 4.1 mg/dL (ref 2.5–4.6)
Potassium: 4.1 mmol/L (ref 3.5–5.1)
Sodium: 152 mmol/L — ABNORMAL HIGH (ref 135–145)

## 2019-02-09 LAB — AMMONIA: Ammonia: 26 umol/L (ref 9–35)

## 2019-02-09 LAB — MAGNESIUM: Magnesium: 2.3 mg/dL (ref 1.7–2.4)

## 2019-02-09 MED ORDER — LORAZEPAM 2 MG/ML IJ SOLN: 1.0000 mg | INTRAMUSCULAR | Status: DC | PRN

## 2019-02-09 MED ORDER — HYDROMORPHONE BOLUS VIA INFUSION
0.5000 mg | INTRAVENOUS | Status: DC | PRN
  Administered 2019-02-10 – 2019-02-11 (×8): 0.5 mg via INTRAVENOUS
  Filled 2019-02-09: qty 1

## 2019-02-09 MED ORDER — FENTANYL 25 MCG/HR TD PT72
1.0000 | MEDICATED_PATCH | TRANSDERMAL | Status: DC
  Administered 2019-02-09: 1 via TRANSDERMAL
  Filled 2019-02-09: qty 1

## 2019-02-09 MED ORDER — GLYCOPYRROLATE 0.2 MG/ML IJ SOLN
0.2000 mg | INTRAMUSCULAR | Status: DC | PRN
  Filled 2019-02-09: qty 1

## 2019-02-09 MED ORDER — FUROSEMIDE 10 MG/ML IJ SOLN
60.0000 mg | Freq: Two times a day (BID) | INTRAMUSCULAR | Status: DC
  Administered 2019-02-09: 60 mg via INTRAVENOUS
  Filled 2019-02-09: qty 6

## 2019-02-09 MED ORDER — LIP MEDEX EX OINT
TOPICAL_OINTMENT | CUTANEOUS | Status: AC
  Administered 2019-02-09: 11:00:00
  Filled 2019-02-09: qty 7

## 2019-02-09 MED ORDER — STERILE WATER FOR INJECTION IV SOLN
INTRAVENOUS | Status: DC
  Administered 2019-02-09: 17:00:00 via INTRAVENOUS
  Filled 2019-02-09: qty 448

## 2019-02-09 MED ORDER — SODIUM CHLORIDE 0.9 % IV SOLN
0.1000 mg/h | INTRAVENOUS | Status: DC
  Administered 2019-02-09 – 2019-02-11 (×2): 0.1 mg/h via INTRAVENOUS
  Filled 2019-02-09 (×3): qty 2.5

## 2019-02-09 NOTE — Significant Event (Signed)
Rapid Response Event Note  Overview: Time Called: 5465 Arrival Time: 1141 Event Type: MEWS, Respiratory Notified by bedside RN in regards to patient's MEWS score increasing to a score of 2 due to respiratory rate. Patient's respiratory rate was between 25-30. Upon arrival to room patient's O2 Sats were 93-95% on 3LNC. Patient appeared to be in pain, responsive voice. Husband at bedside.   Initial Focused Assessment: Neuro: patient able to respond to voice, patient oriented to self but disoriented x3. Patient able to follow simple commands, able to move upper and lower extremities upon commands bilaterally but patient was weak in doing so. Pupils 2-3+ reactive to light, brisk, and round. Patient complaining of stomach pain. Patient grimacing to this pain. Patient had a rectal temp of 99.49F.  Cardiac: Patient was in NSR, HR low 80s, patient was HTN as well, see vital signs Pulmonary: RR 25-30, slightly labored but not utilizing accessory respiratory muscles to breathe, O2 Sats 93-95% on 3LNC. Breath sounds clear/diminished in upper and lower lung fields.   Interventions: Advised bedside RN to give PRN medication if ordered.  Ordered bedside RN and 4th floor charge RN to place patient on telemetry for now.  MD Grandville Silos came to bedside to assess patient.   Plan of Care (if not transferred): Place patient on telemetry, call Rapid Response if MEWS score increases or patient has acute change in vital signs and or mental status.          Kaylee Rasmussen

## 2019-02-09 NOTE — Progress Notes (Signed)
Palliative Care Progress Note  Spoke with patient's husband Kaylee Rasmussen re: overall decline in her condition. She is having worsening pain, encephalopathy and may be moving towards EOL. He has verbally consented to DNR and to increase her pain meds for her comfort. I will meet him this afternoon around 5PM and I plan to discuss a transition into hospice care.  Lane Hacker, DO Palliative Medicine

## 2019-02-09 NOTE — Progress Notes (Signed)
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 She was admitted for blood in stool and chronic diarrhea and abnormal liver function. She's now s/p colonoscopy with GI    Assessment & Plan:   Principal Problem:   ARF (acute renal failure) (HCC) Active Problems:   Rheumatoid arthritis involving multiple sites with positive rheumatoid factor (HCC)   History of hypertension   History of hypothyroidism   Abnormal liver function   Diarrhea   Pulmonary nodule   Metabolic acidosis   Severe protein-calorie malnutrition (HCC)   Rectal bleeding   LFTs abnormal   Cirrhosis (HCC)   Pancreatic insufficiency   Ascites of liver   AKI (acute kidney injury) ([place])  1 shortness of breath/pulmonary edema Patient noted with complaints of shortness of breath this morning.  Crackles noted on examination.  Chest x-ray done yesterday evening 02/08/2019 consistent with pulmonary edema and volume overload.  Will place patient on Lasix 40 mg IV every 12 hours.  Check a chest x-ray.  Strict I's and O's.  Daily weights.  Follow.  2.  Blood in stool, CMV colitis ruled out Patient underwent colonoscopy with findings of localized inflammation at the ileocecal valve, external and internal hemorrhoids noted.  CMV negative.  Patient with no overt bleeding.  Patient status post 2 units packed red blood cells on 01/28/2019 for hemoglobin of 6.7.  Hemoglobin currently stable at 10.8.  GI signed off as of 02/03/2019.  Repeat labs.  Continue supportive care.  3.   Abnormal LFTs with hepatorenal syndrome Patient underwent MRCP during this admission that demonstrated severe diffuse hepatic steatosis, cannot exclude cirrhosis, solitary subcentimeter right liver lesion likely benign cyst.  Patient noted to have been on methotrexate.  Patient noted to have large ascites with worsening renal function concerning for hepatorenal syndrome.  Patient status post large-volume paracentesis diuresed 4.5 L on 01/12/2019.  Patient underwent liver ultrasound which showed hepatic cirrhosis, moderate volume ascites most prominent in the right lower quadrant, minimal to and fro flow within the main portal and splenic veins.  No discrete thrombosis appreciated.  Slow hepatopetal flow within the right and left portal veins.  Patent paraumbilical varix.  Hepatofugal flow.  GI initially planned for liver biopsy however has remained on hold as likely will not change current management.  GI signed off was 02/03/2019.  Patient underwent diagnostic ultrasound today 02/08/2019 with 100 cc of yellow fluid removed.  Follow.  4.  Chronic diarrhea Questionable etiology.  Gliadin antibodies within normal limits.  TTG normal.  Initially improved on Colestid twice daily, Imodium and Creon.  Colestid was stopped 01/21/2019 as it was felt may be contributing to nausea.  Entocort started 7/31 with significant clinical improvement.  Decreased number of stools.  5.  Community-acquired pneumonia Status post 5 days of IV Rocephin and azithromycin.  No further antibiotics needed.  6.  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  Baseline creatinine around 1.5..  Creatinine 1.65 on 02/08/2019.  Labs pending.  Patient was seen by nephrology who had recommended to hold diuretics with outpatient nephrology follow-up in 1 month.  Nephrology has signed off.  Follow.  7.  Hypertension/history of hypertension Norvasc on hold due to swelling.  Beta-blocker  resumed.  On low-dose hydralazine.  Avoid nephrotoxins.  8.  Non-anion gap acidosis Likely secondary to diarrhea.  Resolved.  9.  Nausea/vomiting Continue IV antiemetics.  Colestid recently stopped per GI as it was felt may be contributing to nausea.  On Marinol.  Encourage oral intake.  10.  Hypothyroidism Continue Synthroid.  11.  Anxiety Lexapro.  12.  Normocytic anemia No overt bleeding.  Status post 2 units packed red blood cells with hemoglobin currently at 10.8 from 6.7 on 01/28/2019.  Labs pending.  13.  Rheumatoid arthritis Currently off Humira.  Follows with Dr. Estanislado Pandy of rheumatology in the outpatient setting.  Voltaren gel as needed.  14.  Pulmonary nodule Outpatient follow-up.  15.  Hypomagnesemia/hypophosphatemia Repleted.  16.  Toxic metabolic encephalopathy Patient more lethargic this morning.  Chest x-ray done on 02/08/2019- for any acute infiltrate however concerning for volume overload.  Patient underwent paracentesis with only 100 cc of fluid removed.  Place patient on IV Lasix.  Check a ammonia level.  Follow.  67.  Debility Patient seen by physical therapy who are recommending SNF.  Patient declining and palliative care following.   DVT prophylaxis: SCDs Code Status: Full Family Communication: Updated husband at bedside. Disposition Plan: To be determined.   Consultants:   GI  Nephrology  Palliative Care  Procedures:   Colonoscopy 7/19  EGD 7/31  Antimicrobials:   IV Rocephin  IV azithromycin     Subjective: Patient lethargic. Arousable. Patient c/o SOB.  Patient denies any chest pain.  Diarrhea improved.  Objective: Vitals:   02/09/19 0433 02/09/19 0500 02/09/19 1050 02/09/19 1051  BP: (!) 173/82  (!) 164/74   Pulse: 77  80   Resp: 20  (!) 32   Temp: 98.3 F (36.8 C)  98.6 F (37 C)   TempSrc: Oral  Oral   SpO2: 91%  (!) 88% 93%  Weight:  67.5 kg    Height:        Intake/Output Summary (Last 24 hours) at 02/09/2019  1209 Last data filed at 02/09/2019 0600 Gross per 24 hour  Intake 1028.14 ml  Output 450 ml  Net 578.14 ml   Filed Weights   02/08/19 0513 02/09/19 0433 02/09/19 0500  Weight: 68.7 kg 67.3 kg 67.5 kg    Examination:  General exam: Appears calm and comfortable  Respiratory system: Diffuse crackles. Decreased BS in bases. Cardiovascular system: S1 & S2 heard, RRR. No JVD, murmurs, rubs, gallops or clicks. No pedal edema. Gastrointestinal system: Abdomen is nondistended, soft and nontender. No organomegaly or masses felt. Normal bowel sounds heard. Central nervous system: Alert and oriented. No focal neurological deficits. Extremities: Symmetric 5 x 5 power. Skin: No rashes, lesions or ulcers Psychiatry: Judgement and insight appear poor to fair. Mood & affect appropriate.     Data Reviewed: I have personally reviewed following labs and imaging studies  CBC: Recent Labs  Lab 02/07/19 0518  WBC 9.5  NEUTROABS 7.7  HGB 10.8*  HCT 35.5*  MCV 104.1*  PLT 825*   Basic Metabolic Panel: Recent Labs  Lab 02/03/19 0326 02/04/19 0434 02/05/19 0411 02/06/19 0357 02/07/19 0518 02/08/19 0411  NA 144 145 147* 148* 151* 147*  K 3.7 3.9 3.9 3.8 3.8 4.2  CL 101 102 105 106 110 111  CO2 31 32 31 30 28 26   GLUCOSE 104* 108* 104* 103* 110* 98  BUN 108* 113* 114* 110* 96* 105*  CREATININE 1.99* 2.01* 2.06* 1.87* 1.79* 1.65*  CALCIUM 9.3 9.3 9.6 9.6 9.6 9.3  MG 2.5* 2.5*  --  2.3 2.4 2.3  PHOS 3.6 3.7 4.1 4.0 4.2 4.4   GFR: Estimated Creatinine Clearance: 29.1 mL/min (A) (by C-G formula based on SCr of 1.65 mg/dL (H)). Liver Function Tests: Recent Labs  Lab 02/03/19 0326 02/04/19 0434 02/05/19 0411 02/06/19 0357 02/07/19 0518  AST 106* 107* 101* 105* 95*  ALT 57* 66* 67* 70* 70*  ALKPHOS 95 103 101 107 101  BILITOT 1.3* 1.4* 1.6* 1.4* 1.6*  PROT 5.8* 5.7* 6.2* 6.0* 6.2*  ALBUMIN 3.8 3.7 4.0 3.7 3.7   No results for input(s): LIPASE, AMYLASE in the last 168 hours.  Recent Labs  Lab 02/05/19 1538  AMMONIA 21   Coagulation Profile: No results for input(s): INR, PROTIME in the last 168 hours. Cardiac Enzymes: No results for input(s): CKTOTAL, CKMB, CKMBINDEX, TROPONINI in the last 168 hours. BNP (last 3 results) No results for input(s): PROBNP in the last 8760 hours. HbA1C: No results for input(s): HGBA1C in the last 72 hours. CBG: Recent Labs  Lab 02/02/19 2138  GLUCAP 112*   Lipid Profile: Recent Labs    02/07/19 0518  TRIG 78   Thyroid Function Tests: No results for input(s): TSH, T4TOTAL, FREET4, T3FREE, THYROIDAB in the last 72 hours. Anemia Panel: No results for input(s): VITAMINB12, FOLATE, FERRITIN, TIBC, IRON, RETICCTPCT in the last 72 hours. Sepsis Labs: No results for input(s): PROCALCITON, LATICACIDVEN in the last 168 hours.  Recent Results (from the past 240 hour(s))  SARS CORONAVIRUS 2 Nasal Swab Aptima Multi Swab     Status: None   Collection Time: 02/07/19 11:27 AM   Specimen: Aptima Multi Swab; Nasal Swab  Result Value Ref Range Status   SARS Coronavirus 2 NEGATIVE NEGATIVE Final    Comment: (NOTE) SARS-CoV-2 target nucleic acids are NOT DETECTED. The SARS-CoV-2 RNA is generally detectable in upper and lower respiratory specimens during the acute phase of infection. Negative results do not preclude SARS-CoV-2 infection, do not rule out co-infections with other pathogens, and should not be used as the sole basis for treatment or other patient management decisions. Negative results must be combined with clinical observations, patient history, and epidemiological information. The expected result is Negative. Fact Sheet for Patients: SugarRoll.be Fact Sheet for Healthcare Providers: https://www.woods-mathews.com/ This test is not yet approved or cleared by the [place] FDA and  has been authorized for detection and/or diagnosis of SARS-CoV-2 by FDA under an Emergency  Use Authorization (EUA). This EUA will remain  in effect (meaning this test can be used) for the duration of the COVID-19 declaration under Section 56 4(b)(1) of the Act, 21 U.S.C. section 360bbb-3(b)(1), unless the authorization is terminated or revoked sooner. Performed at [facility], [place] [address], [place], [place] [address]   Body fluid culture     Status: None (Preliminary result)   Collection Time: 02/08/19  5:36 PM   Specimen: Peritoneal Fluid  Result Value Ref Range Status   Specimen Description   Final    PERITONEAL Performed at [place] [address], [place],  [address]    Special Requests   Final    NONE Performed at [facility], [place] [address] [place], [place] [address]    Gram Stain   Final    FEW WBC PRESENT, PREDOMINANTLY MONONUCLEAR NO ORGANISMS SEEN  Culture   Final    NO GROWTH < 12 HOURS Performed at [facility], [place] [address], [place], [place] [address]    Report Status PENDING  Incomplete         Radiology Studies: US Paracentesis  Result Date: 02/09/2019 INDICATION: Patient with history of rheumatoid arthritis, encephalopathy, hepatorenal syndrome, chronic kidney disease, prior CMV colitis, failure to thrive, cirrhosis by imaging, recurrent ascites. Request made for diagnostic paracentesis. EXAM: ULTRASOUND GUIDED DIAGNOSTIC PARACENTESIS MEDICATIONS: None COMPLICATIONS: None immediate. PROCEDURE: Informed written consent was obtained from the patient's husband after a discussion of the risks, benefits and alternatives to treatment. A timeout was performed prior to the initiation of the procedure. Initial ultrasound scanning demonstrates a trace amount of ascites within the left upper to mid abdominal quadrant. The left upper to mid abdomen was prepped and draped in the usual sterile fashion. 1% lidocaine was used for local anesthesia. Following this, a 19 gauge, 7-cm, Yueh catheter  was introduced. An ultrasound image was saved for documentation purposes. The paracentesis was performed. The catheter was removed and a dressing was applied. The patient tolerated the procedure well without immediate post procedural complication. FINDINGS: A total of approximately 100 cc of yellow fluid was removed. Samples were sent to the laboratory as requested by the clinical team. IMPRESSION: Successful ultrasound-guided diagnostic paracentesis yielding 100 ccof peritoneal fluid. Read by: Rowe Robert, PA-C Electronically Signed   By: Jerilynn Mages.  Shick M.D.   On: 02/08/2019 17:17   Dg Chest Port 1 View  Result Date: 02/08/2019 CLINICAL DATA:  Pneumonia.  COPD. EXAM: PORTABLE CHEST 1 VIEW COMPARISON:  Chest x-ray dated January 11, 2019 FINDINGS: There are moderate-sized bilateral pleural effusions, both increased in size from prior x-ray. There is a well-positioned right-sided PICC line with tip terminating near the distal SVC. The heart is enlarged. There are bibasilar airspace opacities favored to represent atelectasis. There is generalized volume overload with findings concerning for pulmonary edema. There is no pneumothorax. IMPRESSION: 1. Moderate-sized bilateral pleural effusions. 2. Cardiomegaly with pulmonary edema. 3. Well-positioned right-sided PICC line. 4. Bibasilar airspace opacities favored to represent compressive atelectasis, however an infiltrate is not excluded. Electronically Signed   By: Constance Holster M.D.   On: 02/08/2019 19:37        Scheduled Meds: . budesonide  9 mg Oral Daily  . calcium carbonate  500 mg of elemental calcium Oral BID   And  . cholecalciferol  200 Units Oral BID  . dronabinol  2.5 mg Oral BID AC  . escitalopram  10 mg Oral Daily  . furosemide  60 mg Intravenous Q12H  . hydrALAZINE  10 mg Oral Q8H  . levothyroxine  88 mcg Oral Q0600  . metoprolol tartrate  25 mg Oral BID  . ondansetron  4 mg Intravenous Q8H  . pantoprazole  40 mg Oral BID   Continuous  Infusions: . TPN ADULT (ION) 40 mL/hr at 02/09/19 0600  . TPN ADULT (ION)       LOS: 33 days    Time spent: 45 minutes    Irine Seal, MD [facility]  If 7PM-7AM, please contact night-coverage [email or web address] 02/09/2019, 12:09 PM

## 2019-02-09 NOTE — Progress Notes (Signed)
Palliative Care Progress Note  Patient continues to decline and her prognosis remains very poor, she now has evidence of pulmonary edema, a diffuse vasculitic rash, hypernatremia, rising LFTs and worsening abdominal pain. I have discussed her life threatening condition with her husband and her daughter, her long and protracted hospital course and her overall QOL. Her condition is not reversible.  They have agreed to a transition to full comfort care at this point and I do not think there are really any more medical options remaining to improve her condition. Family have appropriate grief and are extremely distraught over the time they have lost with her since she has been hospitalized over the past few months due to visitor restrictions and other limitations. Patient's daughter is in route to [facility] from [place]. I explained visitor policy. Her husband is requesting to spend the night with her and this is reasonable given her condition.  Recommendations:  1. DNR 2. Transition to full comfort care 3. Stop TPN especially in the setting of pulmonary edema and liver failure 4. Initiate low dose hydromorphone infusion for comfort with bolus doses for distress. 5. We discussed the possibility of hospice house- they live in [place]-this may be an option if she is safe to transport-but I suspect she will have a hospital death.    Prognosis: hours-days Disposition: [place], [place] [facility] [phone]

## 2019-02-09 NOTE — Progress Notes (Addendum)
   02/09/19 1050  Vitals  Temp 98.6 F (37 C)  Temp Source Oral  BP (!) 164/74  MAP (mmHg) 101  BP Location Left Arm  BP Method Automatic  Patient Position (if appropriate) Lying  Pulse Rate 80  Pulse Rate Source Monitor  Resp (!) 32  Oxygen Therapy  SpO2 (!) 88 %  O2 Device Nasal Cannula  O2 Flow Rate (L/min) 2 L/min  MEWS Score  MEWS RR 2  MEWS Pulse 0  MEWS Systolic 0  MEWS LOC 0  MEWS Temp 0  MEWS Score 2  MEWS Score Color Yellow  MEWS Assessment  Is this an acute change? Yes  MEWS guidelines implemented *See [place]  Provider Notification  Provider Name/Title Grandville Silos, MD  Date Provider Notified 02/09/19  Time Provider Notified 1130  Notification Type Page  Notification Reason Change in status  Response Other (Comment) (MD came to assess patient)  Date of Provider Response 02/09/19  Time of Provider Response 1200  Rapid Response Notification  Name of Rapid Response RN Notified [place]  Date Rapid Response Notified 02/09/19  Time Rapid Response Notified 8756    MEWS changed from green to yellow after 1050 VS were taken. Pt appeared SOB and verbalized c/o SOB. Pt demonstrated shallow, rapid, labored breathing. O2 Sats 88-90% on 2L/[place]- increased to 3L/[place] and O2 Sats increased to 93-94. Pt alert to voice/touch although she would not open her eyes often and had delayed verbal responses, or no verbal response at all. Pt did not recognize her husband at bedside (husband states this is the first time she has not recognized him). Pt's color gray and lips extremely dry. Pt refused oral care and would not open her mouth to be swabbed or to attempt PO medications. Carmex applied to lips. MEWS yellow guidelines initiated. RR & MD Grandville Silos notified- both assessed patient at bedside shortly after. 60mg  IV Lasix given as ordered and FC placed. Palliative notified by RN- did speak to husband on phone and code status changed to DNR. Awaiting further palliative meeting  this afternoon.

## 2019-02-09 NOTE — TOC Progression Note (Signed)
Transition of Care [facility]) - Progression Note    Patient Details  Name: Kaylee Rasmussen MRN: [id number] Date of Birth: Nov 26, 1950  [facility] CM/SW Contact  Matalyn Nawaz, Juliann Pulse, RN Phone Number: 02/09/2019, 10:13 AM  Clinical Narrative: TC [facility] rep Clista Rainford-update given-TPN, full code, palliative following-iv pain meds. covid neg on 8/17. TC & left message w/[employer] for rep following case to call back for update-per yesterday noted denied auth for SNF-will need peer to peer from MD-CM await call back.      Expected Discharge Plan: [place] Barriers to Discharge: Continued Medical Work up  Expected Discharge Plan and Services Expected Discharge Plan: [place]   Discharge Planning Services: CM Consult Post Acute Care Choice: [place] arrangements for the past 2 months: Single Family Home Expected Discharge Date: 01/08/19                                     Social Determinants of Health (SDOH) Interventions    Readmission Risk Interventions No flowsheet data found.

## 2019-02-09 NOTE — Progress Notes (Signed)
I initiated a follow-up visit prior to leave the campus. Pt's family was more controlled but appropriately sad. They were very appreciative visit and continued support.  I offered additional availability if needed. [place], [place]   02/09/19 2000  Clinical Encounter Type  Visited With Family

## 2019-02-09 NOTE — Progress Notes (Signed)
PHARMACY - ADULT TOTAL PARENTERAL NUTRITION CONSULT NOTE   Pharmacy Consult for TPN Indication: intolerance to enteral feeding (refusal to place NGT)   Patient Measurements: Height: _0  (154.9 cm) Weight: 148 lb 13 oz (67.5 kg) IBW/kg (Calculated) : 47.8 TPN AdjBW (KG): 53.8 Body mass index is 28.12 kg/m. Usual Weight: ~70 kg  HPI: 77 yoF with PMH HTN, HLD, COPD, asthma, RA previously on immune modulators, h/o necrotizing glomerulonephritis/vasculitis (+ ANCA), and pancytopenia secondary to cyclophosphamide. She presented to the ED with complaints of chronic diarrhea, intermittent n/v, and some blood noted in stool. During 07/2018 admission in [place], EGD/colonoscopy showed an ulcerated mass-like lesion in the ileocecal valve with satellite ulcers. 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 New diagnosis of cirrhosis this admission with portal HTN and severe ascites.  EGD here still shows duodenal ulcer and distal esophagitis; very poor PO intake. Cortrak feeding tube placed 8/3 but subsequently dislodged. Patient did not tolerate replacement of NGT and now refusing any further attempt at replacement. Pharmacy consulted to dose TPN. Per RD, considered at risk for refeeding  Central access: PICC placed 7/31 TPN start date: 8/6  Significant events:  - 8/8: phosphorus remains low, will not bolus with fluid overload, will increase in TPN, will not advance TPN rate today. Conisidering dialysis if intake, fluid volume not improved, Renal following. - 8/10: concentrating TPN to 65 ml/hr, same total nutrition as with previous formulation at 85 ml/hr  ASSESSMENT                                                                                                           Today, 02/09/2019:  Glucose (CBG goal 100-150) no longer checking routine CBGs; SBG WNL  Systemic absorption of Entocort is low (< 20%) so expect minimal effect on CBGs  Labs from  8/18  Electrolytes -Na 147- sl elevated.  other lytes WNL  Renal - AoCKD d/t hepatorenal syndrome; SCr now stable/declining  but remains elevated (baseline appears to be ~1.5 so not yet back to normal); BUN elevated at 105;  LFTs - AST/ALT still elevated but stable; Alk Phos stable WNL, Tbili remains slightly elevated; albumin WNL (iatrogenic d/t ascites treatment) (8/17)  TGs - WNL (8/17)  Prealbumin - improved to WNL (8/17)  Current Nutrition - salt-restricted diet, partial intake (charted as 10-25 % eaten over last few days)  IVF - none d/t overload  NUTRITIONAL GOALS                                                                                              RD recs (8/6): Kcal:  2000-2200 kcal Protein:  100-115 grams Fluid:  >/=  2.5 L/day  PLAN                                                                                                                          Continues on Tums/Vit D per MD  At 1800 today:  TPN rate decreased to 40 ml/hr on 8/13 d/t difficulty determining if TPN is the cause of rising BUN and LFTs. Continue current TPN at 40 ml/hr  Concentrated TPN at goal rate of 60 ml/hr provides 100 g of protein and 2037 kcals, meeting 100% of patient needs  Utilizing concentrated (15%) amino acids  Add MVI daily and trace elements MWF (due to national shortage)  Electrolytes: continue to hold Mag- may add back tomorrow; Cl:Ac = max Cl  IVF (none currently) per MD - avoiding d/t ascites/HRS  No longer checking routine CBGs  TPN lab panels on Mondays & Thursdays  Per MD note 8/15 " Discussed with GI. Plan to continue TPN, even with discharge to SNF, with eventual weaning down the road"  Dolly Rias RPh 02/09/2019, 10:33 AM
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
## 2019-02-09 NOTE — TOC Progression Note (Signed)
Transition of Care [facility]) - Progression Note    Patient Details  Name: Kaylee Rasmussen MRN: [id number] Date of Birth: [birth year]  Transition of Care [facility]) CM/SW Contact  Lalani Winkles, Juliann Pulse, RN Phone Number: 02/09/2019, 11:53 AM  Clinical Narrative: Received call back from [employer] rep elizabeth-they recommend: once patient is closer to medical stability then resubmit/contact for auth for SNF to [facility], also updates will be needed from PT.Updated given to [facility] rep Juliann Pulse also.      Expected Discharge Plan: [place] Barriers to Discharge: Continued Medical Work up  Expected Discharge Plan and Services Expected Discharge Plan: [place]   Discharge Planning Services: CM Consult Post Acute Care Choice: [place] arrangements for the past 2 months: Single Family Home Expected Discharge Date: 01/08/19                                     Social Determinants of Health (SDOH) Interventions    Readmission Risk Interventions No flowsheet data found.

## 2019-02-10 MED ORDER — LOPERAMIDE HCL 2 MG PO CAPS: 2.0000 mg | ORAL_CAPSULE | ORAL | Status: DC | PRN

## 2019-02-10 MED ORDER — ONDANSETRON HCL 4 MG/2ML IJ SOLN: 4.0000 mg | Freq: Four times a day (QID) | INTRAMUSCULAR | Status: DC | PRN

## 2019-02-10 MED ORDER — BISACODYL 10 MG RE SUPP: 10.0000 mg | Freq: Every day | RECTAL | Status: DC | PRN

## 2019-02-10 MED ORDER — GLYCOPYRROLATE 0.2 MG/ML IJ SOLN
0.2000 mg | INTRAMUSCULAR | Status: DC | PRN
  Filled 2019-02-10: qty 1

## 2019-02-10 MED ORDER — GLYCOPYRROLATE 1 MG PO TABS
1.0000 mg | ORAL_TABLET | ORAL | Status: DC | PRN
  Filled 2019-02-10: qty 1

## 2019-02-10 MED ORDER — HALOPERIDOL LACTATE 2 MG/ML PO CONC
0.5000 mg | ORAL | Status: DC | PRN
  Filled 2019-02-10: qty 0.3

## 2019-02-10 MED ORDER — OXYBUTYNIN CHLORIDE 5 MG PO TABS: 2.5000 mg | ORAL_TABLET | Freq: Four times a day (QID) | ORAL | Status: DC | PRN

## 2019-02-10 MED ORDER — LORAZEPAM 2 MG/ML PO CONC
1.0000 mg | ORAL | Status: DC | PRN
  Filled 2019-02-10: qty 0.5

## 2019-02-10 MED ORDER — HALOPERIDOL 0.5 MG PO TABS
0.5000 mg | ORAL_TABLET | ORAL | Status: DC | PRN
  Filled 2019-02-10: qty 1

## 2019-02-10 MED ORDER — NYSTATIN 100000 UNIT/GM EX POWD: Freq: Three times a day (TID) | CUTANEOUS | Status: DC | PRN

## 2019-02-10 MED ORDER — DIPHENHYDRAMINE HCL 50 MG/ML IJ SOLN: 12.5000 mg | INTRAMUSCULAR | Status: DC | PRN

## 2019-02-10 MED ORDER — LORAZEPAM 2 MG/ML IJ SOLN: 1.0000 mg | INTRAMUSCULAR | Status: DC | PRN

## 2019-02-10 MED ORDER — LORAZEPAM 1 MG PO TABS: 1.0000 mg | ORAL_TABLET | ORAL | Status: DC | PRN

## 2019-02-10 MED ORDER — HALOPERIDOL LACTATE 5 MG/ML IJ SOLN: 0.5000 mg | INTRAMUSCULAR | Status: DC | PRN

## 2019-02-10 MED ORDER — SODIUM CHLORIDE 0.9 % IV SOLN
12.5000 mg | Freq: Four times a day (QID) | INTRAVENOUS | Status: DC | PRN
  Filled 2019-02-10: qty 0.5

## 2019-02-10 MED ORDER — ONDANSETRON 4 MG PO TBDP: 4.0000 mg | ORAL_TABLET | Freq: Four times a day (QID) | ORAL | Status: DC | PRN

## 2019-02-10 MED ORDER — BIOTENE DRY MOUTH MT LIQD: 15.0000 mL | OROMUCOSAL | Status: DC | PRN

## 2019-02-10 MED ORDER — ACETAMINOPHEN 650 MG RE SUPP: 650.0000 mg | Freq: Four times a day (QID) | RECTAL | Status: DC | PRN

## 2019-02-10 MED ORDER — TEMAZEPAM 7.5 MG PO CAPS
7.5000 mg | ORAL_CAPSULE | Freq: Every evening | ORAL | Status: DC | PRN
  Filled 2019-02-10: qty 1

## 2019-02-10 MED ORDER — MAGIC MOUTHWASH W/LIDOCAINE
15.0000 mL | Freq: Four times a day (QID) | ORAL | Status: DC | PRN
  Filled 2019-02-10: qty 15

## 2019-02-10 MED ORDER — ACETAMINOPHEN 325 MG PO TABS: 650.0000 mg | ORAL_TABLET | Freq: Four times a day (QID) | ORAL | Status: DC | PRN

## 2019-02-10 MED ORDER — POLYVINYL ALCOHOL 1.4 % OP SOLN: 1.0000 [drp] | Freq: Four times a day (QID) | OPHTHALMIC | Status: DC | PRN

## 2019-02-10 NOTE — TOC Transition Note (Signed)
Transition of Care [facility]) - CM/SW Discharge Note   Patient Details  Name: Kaylee Rasmussen MRN: [id number] Date of Birth: 1951/06/03  Transition of Care [facility]) CM/SW Contact:  Dessa Phi, RN Phone Number: 02/10/2019, 3:22 PM   Clinical Narrative: [facility] rep Hilary-able to accept for d/c in am-they will order dilaudid pump-once delivered their they will call CM to provide transport by [facility] to their facility. MD please order for saline lock, & cap off picc for transport.DNR is shadow chart for MD signature. Nsg updated.      Final next level of care: [place] Barriers to Discharge: (P) Continued Medical Work up   Patient Goals and CMS Choice Patient states their goals for this hospitalization and ongoing recovery are:: go home CMS Medicare.gov Compare Post Acute Care list provided to:: Patient Choice offered to / list presented to : Spouse, Patient  Discharge Placement              Patient chooses bed at: ([place]) Patient to be transferred to facility by: [place]      Discharge Plan and Services   Discharge Planning Services: CM Consult Post Acute Care Choice: [place] Determinants of Health (SDOH) Interventions     Readmission Risk Interventions No flowsheet data found.

## 2019-02-10 NOTE — Progress Notes (Signed)
Daily Progress Note   Patient Name: Kaylee Rasmussen       Date: 02/10/2019 DOB: 1951/02/07  Age: 68 y.o. MRN#: [id number] Attending Physician: Eugenie Filler, MD Primary Care Physician: Nicoletta Dress, MD Admit Date: 01/06/2019  Reason for Consultation/Follow-up: Terminal Care  Subjective:  resting on the side of the bed with eyes open, in no distress. Husband at bedside.   Length of Stay: 34  Current Medications: Scheduled Meds:  . fentaNYL  1 patch Transdermal Q72H  . ondansetron  4 mg Intravenous Q8H    Continuous Infusions: . chlorproMAZINE (THORAZINE) IV    . HYDROmorphone 0.1 mg/hr (02/10/19 0600)    PRN Meds: acetaminophen **OR** acetaminophen, albuterol, antiseptic oral rinse, bisacodyl, bisacodyl, chlorproMAZINE (THORAZINE) IV, diclofenac sodium, diphenhydrAMINE, glycopyrrolate, glycopyrrolate **OR** glycopyrrolate **OR** glycopyrrolate, haloperidol **OR** haloperidol **OR** haloperidol lactate, HYDROmorphone, HYDROmorphone (DILAUDID) injection, loperamide, LORazepam **OR** LORazepam **OR** LORazepam, LORazepam, magic mouthwash w/lidocaine, nystatin, ondansetron **OR** ondansetron (ZOFRAN) IV, ondansetron, oxybutynin, polyvinyl alcohol, sodium chloride flush, sodium chloride flush, temazepam  Physical Exam         Appears frail and weak Diminished breath sounds Is cachectic S1 S2 Awakens some but is not alert  Vital Signs: BP (!) 159/71   Pulse 76   Temp 98.2 F (36.8 C) (Oral)   Resp (!) 26   Ht 5\' 1"  (1.549 m)   Wt 67.5 kg   SpO2 96%   BMI 28.12 kg/m  SpO2: SpO2: 96 % O2 Device: O2 Device: Nasal Cannula O2 Flow Rate: O2 Flow Rate (L/min): 3 L/min  Intake/output summary:   Intake/Output Summary (Last 24 hours) at 02/10/2019 1148 Last data filed at  02/10/2019 0600 Gross per 24 hour  Intake 12.76 ml  Output 1500 ml  Net -1487.24 ml   LBM: Last BM Date: 02/08/19 Baseline Weight: Weight: 63.5 kg Most recent weight: Weight: 67.5 kg       Palliative Assessment/Data:    Flowsheet Rows     Most Recent Value  Intake Tab  Referral Department  Hospitalist  Unit at Time of Referral  Med/Surg Unit  Palliative Care Primary Diagnosis  Other (Comment) [Hepatic]  Date Notified  01/31/19  Palliative Care Type  New Palliative care  Reason for referral  Clarify Goals of Care  Date of Admission  01/06/19  Date first seen  by Palliative Care  02/01/19  # of days Palliative referral response time  1 Day(s)  # of days IP prior to Palliative referral  25  Clinical Assessment  Palliative Performance Scale Score  40%  Pain Max last 24 hours  8  Pain Min Last 24 hours  5  Psychosocial & Spiritual Assessment  Palliative Care Outcomes  Patient/Family meeting held?  Yes  Who was at the meeting?  Patient, husband  Palliative Care Outcomes  Clarified goals of care      Patient Active Problem List   Diagnosis Date Noted  . 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 Autoimmune purpura ([place]) 02/09/2019  . Shortness of breath   . Acute pulmonary edema (HCC)   . AKI (acute kidney injury) ([place])   . Ascites of liver   . LFTs abnormal   . Cirrhosis ([place])   . Pancreatic insufficiency   . Rectal bleeding   . Severe protein-calorie malnutrition ([place]) 01/08/2019  . Abnormal liver function 01/07/2019  . Diarrhea 01/07/2019  . Pulmonary nodule 01/07/2019  . Metabolic acidosis 93/90/3009  . ARF (acute renal failure) ([place]) 01/06/2019  . Rheumatoid arthritis involving multiple sites with positive rheumatoid factor ([place]) 08/27/2017  . High risk medication use 08/27/2017  . Primary osteoarthritis of both knees 08/27/2017  . History of total hip replacement, right 08/27/2017  . Renal calcinosis 08/27/2017  . History of hypertension 08/27/2017  .  History of hypothyroidism 08/27/2017  . 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   Recommendations/Plan:  Continue comfort measures  Discussed with care management, RN and husband at bedside about residential hospice. Proceed with making arrangements for patient to be transferred to [place] to be closer to family. Patient's husband requests for hospice house transfer, discussed with him.    Goals of Care and Additional Recommendations:  Limitations on Scope of Treatment: Full Comfort Care   Code Status:    Code Status Orders  (From admission, onward)         Start     Ordered   02/10/19 0835  Do not attempt resuscitation (DNR)  Continuous    Question Answer Comment  In the event of cardiac or respiratory ARREST Do not call a "code blue"   In the event of cardiac or respiratory ARREST Do not perform Intubation, CPR, defibrillation or ACLS   In the event of cardiac or respiratory ARREST Use medication by any route, position, wound care, and other measures to relive pain and suffering. May use oxygen, suction and manual treatment of airway obstruction as needed for comfort.      02/10/19 0837        Code Status History    Date Active Date Inactive Code Status Order ID Comments User Context   02/09/2019 1216 02/10/2019 0837 DNR [id number]  Acquanetta Chain, DO Inpatient   01/06/2019 2209 02/09/2019 1216 Full Code [id number]  Jani Gravel, MD ED   Advance Care Planning Activity    Advance Directive Documentation     Most Recent Value  Type of Advance Directive  Living  will  Pre-existing out of facility DNR order (yellow form or pink MOST form)  -  "MOST" Form in Place?  -  Prognosis:   < 2 weeks  Discharge Planning:  Hospice facility  Care plan was discussed with  Patient's husband, RN and care management.   Thank you for allowing the Palliative Medicine Team to assist in the care of this patient.   Time In: 11 Time Out: 11.25 Total Time 25 Prolonged Time Billed  no       Greater than 50%  of this time was spent counseling and coordinating care related to the above assessment and plan.  Loistine Chance, MD [phone]  Please contact Palliative Medicine Team phone at [phone] for questions and concerns.

## 2019-02-10 NOTE — TOC Progression Note (Signed)
Transition of Care [facility]) - Progression Note    Patient Details  Name: AROURA VASUDEVAN MRN: [id number] Date of Birth: 21-Apr-1951  Transition of [facility]) CM/SW Contact  Kalisi Bevill, Juliann Pulse, RN Phone Number: 02/10/2019, 11:50 AM  Clinical Narrative:   [facility] [place] Ownership: For-Profit Date Certified: 35/52/[phone]   [facility] [place]: For-Profit Date Certified: 97/91/[phone]) [phone]   [facility] Ownership: Non-Profit Date Certified: 96/88/6484(720) [phone]   [facility] [place] Ownership: Non-Profit Date Certified: 33/74/4514(604) [phone]   [facility] [place] Ownership: Non-Profit Date Certified: 58/72/7618(485) [place] Ownership: Non-Profit Date Certified: 92/76/3943(200) [phone]   [facility] Ownership: Non-Profit Date Certified: 19/06/2222(114) [phone]   [place]: For-Profit Date Certified: 67/06/1001(496) [phone]   End of Results     Expected Discharge Plan: [place] Barriers to Discharge: Continued Medical Work up  Expected Discharge Plan and Services Expected Discharge Plan: [place]   Discharge Planning Services: CM Consult Post Acute Care Choice: [place] arrangements for the past 2 months: Single Family Home Expected Discharge Date: 01/08/19                                     Social Determinants of Health (SDOH) Interventions    Readmission Risk Interventions No flowsheet data found.

## 2019-02-10 NOTE — Progress Notes (Signed)
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 She was admitted for blood in stool and chronic diarrhea and abnormal liver function. She's now s/p colonoscopy with GI    Assessment & Plan:   Principal Problem:   ARF (acute renal failure) (HCC) Active Problems:   Rheumatoid arthritis involving multiple sites with positive rheumatoid factor (HCC)   History of hypertension   History of hypothyroidism   Abnormal liver function   Diarrhea   Pulmonary nodule   Metabolic acidosis   Severe protein-calorie malnutrition (HCC)   Rectal bleeding   LFTs abnormal   Cirrhosis (HCC)   Pancreatic insufficiency   Ascites of liver   AKI (acute kidney injury) ([place])   ANCA-associated vasculitis (HCC)   Autoimmune purpura (HCC)   Shortness of breath   Acute pulmonary edema (HCC)  1 shortness of breath/pulmonary edema Patient noted with complaints of shortness of breath this morning.  Crackles noted on examination.  Chest x-ray done yesterday evening 02/08/2019 consistent with pulmonary edema and volume overload.  Will place patient on Lasix 40 mg IV every 12 hours.  Patient has now been transitioned to full comfort measures.  Strict I's and O's.  Daily weights.  Follow.  2.  Blood in stool, CMV colitis ruled out Patient underwent colonoscopy with findings of localized inflammation at the ileocecal valve, external and internal hemorrhoids noted.  CMV negative.  Patient with no overt bleeding.  Patient status post 2 units packed red blood  cells on 01/28/2019 for hemoglobin of 6.7.  Hemoglobin currently stable at 10.8.  GI signed off as of 02/03/2019. Continue supportive care.  3.  Abnormal LFTs with hepatorenal syndrome Patient underwent MRCP during this admission that demonstrated severe diffuse hepatic steatosis, cannot exclude cirrhosis, solitary subcentimeter right liver lesion likely benign cyst.  Patient noted to have been on methotrexate.  Patient noted to have large ascites with worsening renal function concerning for hepatorenal syndrome.  Patient status post large-volume paracentesis diuresed 4.5 L on 01/12/2019.  Patient underwent liver ultrasound which showed hepatic cirrhosis, moderate volume ascites most prominent in the right lower quadrant, minimal to and fro flow within the main portal and splenic veins.  No discrete thrombosis appreciated.  Slow hepatopetal flow within the right and left portal veins.  Patent paraumbilical varix.  Hepatofugal flow.  GI initially planned for liver biopsy however has remained on hold as likely will not change current management.  GI signed off was 02/03/2019.  Patient underwent diagnostic ultrasound today 02/08/2019 with 100 cc of yellow fluid removed.  Patient has been transitioned to full comfort measures.  Follow.  4.  Chronic diarrhea Questionable etiology.  Gliadin antibodies within normal limits.  TTG normal.  Initially improved on Colestid twice daily, Imodium and Creon.  Colestid was stopped 01/21/2019 as it was felt may be contributing to nausea.  Entocort started 7/31 with significant clinical improvement.  Decreased number of stools.  5.  Community-acquired pneumonia Status post 5 days of IV Rocephin and azithromycin.  No further antibiotics needed.  6.  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  Baseline creatinine around 1.5..  Creatinine 1.59 on 02/09/2019.  Patient was seen by nephrology who had recommended to hold  diuretics with outpatient nephrology follow-up in 1 month.  Nephrology has signed off.  Patient now has been transitioned to full comfort measures.  Follow.  7.  Hypertension/history of hypertension Norvasc on hold due to swelling.  Beta-blocker resumed.  On low-dose hydralazine.  Avoid nephrotoxins.  8.  Non-anion gap acidosis Likely secondary to diarrhea.  Resolved.  9.  Nausea/vomiting Continue IV antiemetics.  Colestid recently stopped per GI as it was felt may be contributing to nausea.  On Marinol.  Encourage oral intake.  10.  Hypothyroidism Patient has been transitioned to full comfort measures.  Synthroid has been discontinued.   11.  Anxiety Continue Ativan as needed.  Patient has now been transitioned to comfort measures.   12.  Normocytic anemia No overt bleeding.  Status post 2 units packed red blood cells with hemoglobin currently at 10.2 on 02/09/2019 from 10.8 from 6.7 on 01/28/2019.  Patient has been transitioned to full comfort.  13.  Rheumatoid arthritis Currently off Humira.  Follows with Dr. Estanislado Pandy of rheumatology in the outpatient setting.  Voltaren gel as needed.  14.  Pulmonary nodule Outpatient follow-up.  15.  Hypomagnesemia/hypophosphatemia Repleted.  16.  Toxic metabolic encephalopathy Patient more lethargic however arousable and alert.  Chest x-ray done on 02/08/2019- for any acute infiltrate however concerning for volume overload.  Patient underwent paracentesis with only 100 cc of fluid removed.  Patient placed on Lasix which has been discontinued.  Patient noted to be hyponatremic.  Ammonia levels within normal limits.  Patient seen by palliative care and patient transition to full comfort measures.  Patient now on a Dilaudid drip.  38.  Debility Patient seen by physical therapy who are recommending SNF.  Patient declining and palliative care following and patient now has transition to full comfort measures.  Patient likely to residential hospice home  versus in hospital death.   DVT prophylaxis: SCDs Code Status: Full Family Communication: Updated husband at bedside. Disposition Plan: Residential hospice home when bed available versus in-house death.     Consultants:   GI  Nephrology  Palliative Care  Procedures:   Colonoscopy 7/19  EGD 7/31  Antimicrobials:   IV Rocephin  IV azithromycin     Subjective: Patient lethargic.  Arousable.  States some complaints of some shortness of breath.  Diarrhea improved.  No chest pain.   Objective: Vitals:   02/09/19 1051 02/09/19 1159 02/09/19 1230 02/09/19 1529  BP:   (!) 176/77 (!) 159/71  Pulse:   79 76  Resp:   (!) 28 (!) 26  Temp:  99 F (37.2 C) 98.5 F (36.9 C) 98.2 F (36.8 C)  TempSrc:  Rectal Oral Oral  SpO2: 93%  92% 96%  Weight:      Height:        Intake/Output Summary (Last 24 hours) at 02/10/2019 1247 Last data filed at 02/10/2019 0600 Gross per 24 hour  Intake 12.76 ml  Output 1500 ml  Net -1487.24 ml   Filed Weights   02/08/19 0513 02/09/19 0433 02/09/19 0500  Weight: 68.7 kg 67.3 kg 67.5 kg    Examination:  General exam: Appears calm and comfortable  Respiratory system: Some bibasilar crackles noted.  Decreased BS in bases. Cardiovascular system: Regular rate and rhythm no murmurs rubs or gallops.  No JVD.  No lower extremity edema.  Gastrointestinal system: Abdomen is soft, nontender, nondistended, positive  bowel sounds.  No rebound.  No guarding.   Central nervous system: Alert and oriented. No focal neurological deficits. Extremities: Symmetric 5 x 5 power. Skin: No rashes, lesions or ulcers Psychiatry: Judgement and insight appear poor to fair. Mood & affect appropriate.     Data Reviewed: I have personally reviewed following labs and imaging studies  CBC: Recent Labs  Lab 02/07/19 0518 02/09/19 1225  WBC 9.5 11.1*  NEUTROABS 7.7 9.2*  HGB 10.8* 10.2*  HCT 35.5* 34.0*  MCV 104.1* 107.6*  PLT 120* 127*   Basic  Metabolic Panel: Recent Labs  Lab 02/04/19 0434 02/05/19 0411 02/06/19 0357 02/07/19 0518 02/08/19 0411 02/09/19 1225  NA 145 147* 148* 151* 147* 152*  K 3.9 3.9 3.8 3.8 4.2 4.1  CL 102 105 106 110 111 118*  CO2 32 31 30 28 26 25   GLUCOSE 108* 104* 103* 110* 98 111*  BUN 113* 114* 110* 96* 105* 94*  CREATININE 2.01* 2.06* 1.87* 1.79* 1.65* 1.59*  CALCIUM 9.3 9.6 9.6 9.6 9.3 9.6  MG 2.5*  --  2.3 2.4 2.3 2.3  PHOS 3.7 4.1 4.0 4.2 4.4 4.1   GFR: Estimated Creatinine Clearance: 30.2 mL/min (A) (by C-G formula based on SCr of 1.59 mg/dL (H)). Liver Function Tests: Recent Labs  Lab 02/04/19 0434 02/05/19 0411 02/06/19 0357 02/07/19 0518 02/09/19 1225  AST 107* 101* 105* 95*  --   ALT 66* 67* 70* 70*  --   ALKPHOS 103 101 107 101  --   BILITOT 1.4* 1.6* 1.4* 1.6*  --   PROT 5.7* 6.2* 6.0* 6.2*  --   ALBUMIN 3.7 4.0 3.7 3.7 4.0   No results for input(s): LIPASE, AMYLASE in the last 168 hours. Recent Labs  Lab 02/05/19 1538 02/09/19 1226  AMMONIA 21 26   Coagulation Profile: No results for input(s): INR, PROTIME in the last 168 hours. Cardiac Enzymes: No results for input(s): CKTOTAL, CKMB, CKMBINDEX, TROPONINI in the last 168 hours. BNP (last 3 results) No results for input(s): PROBNP in the last 8760 hours. HbA1C: No results for input(s): HGBA1C in the last 72 hours. CBG: No results for input(s): GLUCAP in the last 168 hours. Lipid Profile: No results for input(s): CHOL, HDL, LDLCALC, TRIG, CHOLHDL, LDLDIRECT in the last 72 hours. Thyroid Function Tests: No results for input(s): TSH, T4TOTAL, FREET4, T3FREE, THYROIDAB in the last 72 hours. Anemia Panel: No results for input(s): VITAMINB12, FOLATE, FERRITIN, TIBC, IRON, RETICCTPCT in the last 72 hours. Sepsis Labs: No results for input(s): PROCALCITON, LATICACIDVEN in the last 168 hours.  Recent Results (from the past 240 hour(s))  SARS CORONAVIRUS 2 Nasal Swab Aptima Multi Swab     Status: None   Collection  Time: 02/07/19 11:27 AM   Specimen: Aptima Multi Swab; Nasal Swab  Result Value Ref Range Status   SARS Coronavirus 2 NEGATIVE NEGATIVE Final    Comment: (NOTE) SARS-CoV-2 target nucleic acids are NOT DETECTED. The SARS-CoV-2 RNA is generally detectable in upper and lower respiratory specimens during the acute phase of infection. Negative results do not preclude SARS-CoV-2 infection, do not rule out co-infections with other pathogens, and should not be used as the sole basis for treatment or other patient management decisions. Negative results must be combined with clinical observations, patient history, and epidemiological information. The expected result is Negative. Fact Sheet for Patients: SugarRoll.be Fact Sheet for Healthcare Providers: https://www.woods-mathews.com/ This test is not yet approved or cleared by the [place] FDA and  has been  authorized for detection and/or diagnosis of SARS-CoV-2 by FDA under an Emergency Use Authorization (EUA). This EUA will remain  in effect (meaning this test can be used) for the duration of the COVID-19 declaration under Section 56 4(b)(1) of the Act, 21 U.S.C. section 360bbb-3(b)(1), unless the authorization is terminated or revoked sooner. Performed at [facility], [place] [address], [place], [place] [address]   Body fluid culture     Status: None (Preliminary result)   Collection Time: 02/08/19  5:36 PM   Specimen: Peritoneal Fluid  Result Value Ref Range Status   Specimen Description   Final    PERITONEAL Performed at [facility] [address], [place], [place] [address]    Special Requests   Final    NONE Performed at [facility], [place] [address] [place], [place] [address]    Gram Stain   Final    FEW WBC PRESENT, PREDOMINANTLY MONONUCLEAR NO ORGANISMS SEEN    Culture   Final    NO GROWTH 2 DAYS Performed at [facility] [address] [place], [place] [address]    Report Status PENDING  Incomplete         Radiology Studies: US Paracentesis  Result Date: 02/09/2019 INDICATION: Patient with history of rheumatoid arthritis, encephalopathy, hepatorenal syndrome, chronic kidney disease, prior CMV colitis, failure to thrive, cirrhosis by imaging, recurrent ascites. Request made for diagnostic paracentesis. EXAM: ULTRASOUND GUIDED DIAGNOSTIC PARACENTESIS MEDICATIONS: None COMPLICATIONS: None immediate. PROCEDURE: Informed written consent was obtained from the patient's husband after a discussion of the risks, benefits and alternatives to treatment. A timeout was performed prior to the initiation of the procedure. Initial ultrasound scanning demonstrates a trace amount of ascites within the left upper to mid abdominal quadrant. The left upper to mid abdomen was prepped and draped in the usual sterile fashion. 1% lidocaine was used for local anesthesia. Following this, a 19 gauge, 7-cm, Yueh catheter was introduced. An ultrasound image was saved for documentation purposes. The paracentesis was performed. The catheter was removed and a dressing was applied. The patient tolerated the procedure well without immediate post procedural complication. FINDINGS: A total of approximately 100 cc of yellow fluid was removed. Samples were sent to the laboratory as requested by the clinical team. IMPRESSION: Successful ultrasound-guided diagnostic paracentesis yielding 100 ccof peritoneal fluid. Read by: Rowe Robert, PA-C Electronically Signed   By: Jerilynn Mages.  Shick M.D.   On: 02/08/2019 17:17   Dg Chest Port 1 View  Result Date: 02/09/2019 CLINICAL DATA:  16 yoF with hypoxia. PMH HTN, HLD, COPD, asthma, RA previously on immune modulators, h/o necrotizing glomerulonephritis/vasculitis (+ ANCA), and pancytopenia secondary to cyclophosphamide. She presented to the ED with complaints .*comment was truncated* EXAM: PORTABLE CHEST 1 VIEW COMPARISON:   Chest radiographs dated 02/08/2019, 01/11/2019 FINDINGS: Stable cardiomediastinal contours. Right PICC tip overlies the distal SVC. Bandlike opacity in the right mid lung likely reflecting atelectasis. Improved aeration of the bilateral lung bases with persistent opacification of the left hemidiaphragm possibly representing atelectasis versus infiltrate. Diffuse bilateral interstitial opacities may represent mild pulmonary edema. Probable small bilateral pleural effusions, decreased from prior. IMPRESSION: 1. Improved aeration of the bilateral lung bases with persistent opacification of the left hemidiaphragm possibly representing atelectasis versus infiltrate. 2. Mild bilateral diffuse interstitial opacities suspicious for pulmonary edema. Probable small bilateral pleural effusions, decreased from prior. Electronically Signed   By: Audie Pinto M.D.   On: 02/09/2019 13:29   Dg Chest [facility]  1 View  Result Date: 02/08/2019 CLINICAL DATA:  Pneumonia.  COPD. EXAM: PORTABLE CHEST 1 VIEW COMPARISON:  Chest x-ray dated January 11, 2019 FINDINGS: There are moderate-sized bilateral pleural effusions, both increased in size from prior x-ray. There is a well-positioned right-sided PICC line with tip terminating near the distal SVC. The heart is enlarged. There are bibasilar airspace opacities favored to represent atelectasis. There is generalized volume overload with findings concerning for pulmonary edema. There is no pneumothorax. IMPRESSION: 1. Moderate-sized bilateral pleural effusions. 2. Cardiomegaly with pulmonary edema. 3. Well-positioned right-sided PICC line. 4. Bibasilar airspace opacities favored to represent compressive atelectasis, however an infiltrate is not excluded. Electronically Signed   By: Constance Holster M.D.   On: 02/08/2019 19:37        Scheduled Meds:  fentaNYL  1 patch Transdermal Q72H   ondansetron  4 mg Intravenous Q8H   Continuous Infusions:  chlorproMAZINE (THORAZINE) IV       HYDROmorphone 0.1 mg/hr (02/10/19 0600)     LOS: 34 days    Time spent: 40 minutes    Irine Seal, MD [facility]  If 7PM-7AM, please contact night-coverage [email or web address] 02/10/2019, 12:47 PM

## 2019-02-10 NOTE — Progress Notes (Signed)
Nutrition Brief Note RD working remotely.   RD did not enter patient's room today. Patient last assessed by a RD on 8/14. Chart reviewed. Patient is being followed by Palliative Care and transitioned to comfort care yesterday (8/19).  No further nutrition interventions warranted at this time.  Please re-consult as needed.     Jarome Matin, MS, RD, LDN, [facility] Inpatient Clinical Dietitian Pager # [phone] After hours/weekend pager # [phone]

## 2019-02-10 NOTE — Plan of Care (Signed)
  Problem: Health Behavior/Discharge Planning: Goal: Ability to manage health-related needs will improve 02/10/2019 0920 by Timoteo Gaul, RN Outcome: Not Progressing 02/10/2019 0919 by Timoteo Gaul, RN Outcome: Not Progressing 02/10/2019 0919 by Timoteo Gaul, RN Outcome: Progressing   Problem: Clinical Measurements: Goal: Ability to maintain clinical measurements within normal limits will improve 02/10/2019 0920 by Timoteo Gaul, RN Outcome: Not Progressing 02/10/2019 0919 by Timoteo Gaul, RN Outcome: Not Progressing 02/10/2019 0919 by Timoteo Gaul, RN Outcome: Progressing   Problem: Elimination: Goal: Will not experience complications related to bowel motility 02/10/2019 0920 by Timoteo Gaul, RN Outcome: Not Progressing 02/10/2019 0919 by Timoteo Gaul, RN Outcome: Not Progressing 02/10/2019 0919 by Timoteo Gaul, RN Outcome: Progressing Goal: Will not experience complications related to urinary retention 02/10/2019 0920 by Timoteo Gaul, RN Outcome: Not Progressing 02/10/2019 0919 by Timoteo Gaul, RN Outcome: Not Progressing 02/10/2019 0919 by Timoteo Gaul, RN Outcome: Progressing   Problem: Skin Integrity: Goal: Risk for impaired skin integrity will decrease 02/10/2019 0920 by Timoteo Gaul, RN Outcome: Not Progressing 02/10/2019 0919 by Timoteo Gaul, RN Outcome: Not Progressing 02/10/2019 0919 by Timoteo Gaul, RN Outcome: Progressing

## 2019-02-10 NOTE — Progress Notes (Signed)
Pt's daughter bedside of pt when I arrived. Pt was awake and talked in softly. Pt's daughter expressed how surprised at her mom's rapid decline and how much she has changed in the face. She said Mr. Kampf had gone home to rest. Pt's daughter said she was otherwise okay and did not have any needs at that time. Please page if additional support is needed. [place], MDiv   02/10/19 2200  Clinical Encounter Type  Visited With Patient and family together

## 2019-02-11 DIAGNOSIS — Z515 Encounter for palliative care: Secondary | ICD-10-CM

## 2019-02-11 DIAGNOSIS — I776 Arteritis, unspecified: Secondary | ICD-10-CM

## 2019-02-11 MED ORDER — HYDROMORPHONE HCL 1 MG/ML IJ SOLN: 1.0000 mg | INTRAMUSCULAR | 0 refills | Status: AC | PRN

## 2019-02-11 MED ORDER — ONDANSETRON 4 MG PO TBDP: 4.0000 mg | ORAL_TABLET | Freq: Four times a day (QID) | ORAL | 0 refills | Status: AC | PRN

## 2019-02-11 MED ORDER — FENTANYL 25 MCG/HR TD PT72: 1.0000 | MEDICATED_PATCH | TRANSDERMAL | 0 refills | Status: AC

## 2019-02-11 MED ORDER — HYDROMORPHONE BOLUS VIA INFUSION: 0.5000 mg | INTRAVENOUS | 0 refills | Status: AC | PRN

## 2019-02-11 MED ORDER — HEPARIN SOD (PORK) LOCK FLUSH 100 UNIT/ML IV SOLN: 250.0000 [IU] | INTRAVENOUS | Status: DC | PRN

## 2019-02-11 MED ORDER — GLYCOPYRROLATE 1 MG PO TABS: 1.0000 mg | ORAL_TABLET | ORAL | 0 refills | Status: AC | PRN

## 2019-02-11 MED ORDER — OXYBUTYNIN CHLORIDE 5 MG PO TABS: 2.5000 mg | ORAL_TABLET | Freq: Four times a day (QID) | ORAL | 0 refills | Status: AC | PRN

## 2019-02-11 MED ORDER — BISACODYL 10 MG RE SUPP: 10.0000 mg | Freq: Every day | RECTAL | 0 refills | Status: AC | PRN

## 2019-02-11 MED ORDER — SODIUM CHLORIDE 0.9 % IV SOLN: 0.5000 mg/h | INTRAVENOUS | Status: AC

## 2019-02-11 MED ORDER — LORAZEPAM 2 MG/ML PO CONC: 1.0000 mg | ORAL | 0 refills | Status: AC | PRN

## 2019-02-11 MED ORDER — TEMAZEPAM 7.5 MG PO CAPS: 7.5000 mg | ORAL_CAPSULE | Freq: Every evening | ORAL | 0 refills | Status: AC | PRN

## 2019-02-11 MED ORDER — HALOPERIDOL LACTATE 2 MG/ML PO CONC: 0.5000 mg | ORAL | 0 refills | Status: AC | PRN

## 2019-02-11 NOTE — Plan of Care (Signed)
  Problem: Health Behavior/Discharge Planning: Goal: Ability to manage health-related needs will improve 02/11/2019 1530 by Timoteo Gaul, RN Outcome: Completed/Met 02/11/2019 0725 by Timoteo Gaul, RN Outcome: Not Progressing   Problem: Clinical Measurements: Goal: Ability to maintain clinical measurements within normal limits will improve 02/11/2019 1530 by Timoteo Gaul, RN Outcome: Completed/Met 02/11/2019 0725 by Timoteo Gaul, RN Outcome: Not Progressing   Problem: Elimination: Goal: Will not experience complications related to bowel motility 02/11/2019 1530 by Timoteo Gaul, RN Outcome: Completed/Met 02/11/2019 0725 by Timoteo Gaul, RN Outcome: Not Progressing Goal: Will not experience complications related to urinary retention 02/11/2019 1530 by Timoteo Gaul, RN Outcome: Completed/Met 02/11/2019 0725 by Timoteo Gaul, RN Outcome: Not Progressing   Problem: Skin Integrity: Goal: Risk for impaired skin integrity will decrease 02/11/2019 1530 by Timoteo Gaul, RN Outcome: Completed/Met 02/11/2019 0725 by Timoteo Gaul, RN Outcome: Not Progressing

## 2019-02-11 NOTE — Discharge Summary (Signed)
Physician Discharge Summary  Kaylee Rasmussen IAX:[id number] DOB: June 27, 1950 DOA: 01/06/2019  PCP: Kaylee Dress, MD  Admit date: 01/06/2019 Discharge date: 02/11/2019  Time spent: 55 minutes  Recommendations for Outpatient Follow-up:  1. Follow-up with MD at residential hospice home.   Discharge Diagnoses:  Principal Problem:   ARF (acute renal failure) (HCC) Active Problems:   Rheumatoid arthritis involving multiple sites with positive rheumatoid factor (HCC)   History of hypertension   History of hypothyroidism   Abnormal liver function   Diarrhea   Pulmonary nodule   Metabolic acidosis   Severe protein-calorie malnutrition (HCC)   Rectal bleeding   LFTs abnormal   Cirrhosis (HCC)   Pancreatic insufficiency   Ascites of liver   AKI (acute kidney injury) ([place])   ANCA-associated vasculitis (HCC)   Autoimmune purpura (HCC)   Shortness of breath   Acute pulmonary edema ([place])   Palliative care by specialist   Dying care   Discharge Condition: Stable  Diet recommendation: Regular/comfort feeds  Filed Weights   02/09/19 0433 02/09/19 0500 02/11/19 0425  Weight: 67.3 kg 67.5 kg 63.9 kg    History of present illness:  [name]  is a 68 y.o. 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 Bx-CMV colitis. No malignancy. Records scanned in "MEDIA TAB".EGD showed LA grade D reflux  esophagitis, gastritis. -Colonoscopy 08/12/2017-small colonic polyp, moderate colonic diverticulosis  Reviewed CT scan Abd/ pelvis  IMPRESSION: 1. Abnormal appearance of the liver with heterogeneous hepatic steatosis and heterogeneous liver enhancement. Questionable fine liver surface irregularity. Cannot exclude hepatic cirrhosis. No discrete liver masses. Recommend correlation with liver enzymes. 2. Third-spacing of fluid with moderate ascites, small dependent bilateral pleural effusions, right greater than left, and mild anasarca. 3. No evidence of bowel obstruction or acute bowel inflammation. Mild colonic diverticulosis, without convincing findings of acute diverticulitis. 4. Patchy bandlike consolidation in the left lung base, cannot exclude pneumonia or aspiration. 5. Left lung base 4 mm solid pulmonary nodule. No follow-up needed if patient is low-risk. Non-contrast chest CT can be considered in 12 months if patient is high-risk.    In ED,  T 98.5  P 67 R 16  Bp 80/58  Pox 97% on RA Wt 63.5 kg  Current BP 121/58  Na 138, K 4.9,  Bun 21, Creatinine 1.66 Hco3 16, AG 15 Alb 2.2 Ast 96, Alt 20, Alk phos 258, T. Bili 2.6  Wbc 10.9, hgb 10.5, Plt 225 Type and screen done  FOBT negative  covid -19 negative  Pt will be admitted for c/o rectal bleeding , and chronic diarrhea, and non-AG acidosis secondary to diarrhea, and abnormal liver function.   Hospital Course:  1 shortness of breath/pulmonary edema Patient noted with complaints of shortness of breath the morning of 02/09/2019.  Crackles noted on examination.  Chest x-ray done evening 02/08/2019 consistent with pulmonary edema and volume overload.    Patient placed on Lasix 40 mg IV every 12 hours.  Patient subsequently transition to full comfort measures and as  such IV Lasix was discontinued.  Patient placed on a Dilaudid drip.  Patient subsequently transition to comfort measures.  Patient be discharged to  residential hospice home.   2.  Blood in stool, CMV colitis ruled out Patient underwent colonoscopy with findings of localized inflammation at the ileocecal valve, external and internal hemorrhoids noted.  CMV negative.  Patient with no overt bleeding.  Patient status post 2 units packed red blood cells on 01/28/2019 for hemoglobin of 6.7.  Hemoglobin currently stable at 10.8.  GI signed off as of 02/03/2019.  Patient now transition to full comfort measures.  Patient to be discharged to residential hospice home.   3.  Abnormal LFTs with hepatorenal syndrome Patient underwent MRCP during this admission that demonstrated severe diffuse hepatic steatosis, cannot exclude cirrhosis, solitary subcentimeter right liver lesion likely benign cyst.  Patient noted to have been on methotrexate.  Patient noted to have large ascites with worsening renal function concerning for hepatorenal syndrome.  Patient status post large-volume paracentesis diuresed 4.5 L on 01/12/2019.  Patient underwent liver ultrasound which showed hepatic cirrhosis, moderate volume ascites most prominent in the right lower quadrant, minimal to and fro flow within the main portal and splenic veins.  No discrete thrombosis appreciated.  Slow hepatopetal flow within the right and left portal veins.  Patent paraumbilical varix.  Hepatofugal flow.  GI initially planned for liver biopsy however has remained on hold as likely will not change current management.  GI signed off was 02/03/2019.  Patient underwent diagnostic ultrasound 02/08/2019 with 100 cc of yellow fluid removed.  Patient has been transitioned to full comfort measures.   4.  Chronic diarrhea Questionable etiology.  Gliadin antibodies within normal limits.  TTG normal.  Initially improved on Colestid twice daily, Imodium and Creon.  Colestid was stopped 01/21/2019 as it was felt may be contributing to nausea.  Entocort started 7/31 with significant clinical improvement.  Decreased number of  stools.  Patient now transitioned to full comfort measures.  Patient be discharged to residential hospice home.  5.  Community-acquired pneumonia Status post 5 days of IV Rocephin and azithromycin.  No further antibiotics needed.  6.  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  Baseline creatinine around 1.5..  Creatinine 1.59 on 02/09/2019.  Patient was seen by nephrology who had recommended to hold diuretics with outpatient nephrology follow-up in 1 month.  Nephrology has signed off.  Patient now has been transitioned to full comfort measures.  Follow.  7.  Hypertension/history of hypertension Norvasc on hold due to swelling.  Beta-blocker resumed.  On low-dose hydralazine.  Avoid nephrotoxins.  8.  Non-anion gap acidosis Likely secondary to diarrhea.  Resolved.  9.  Nausea/vomiting Patient placed on empiric IV antiemetics.  Colestid recently stopped per GI as it was felt may be contributing to nausea.  On Marinol.  Encouraged oral intake.  Patient however continued to decline during the hospitalization and subsequently transition to full comfort measures and patient will be discharged to residential hospice home.  10.  Hypothyroidism Patient has been transitioned to full comfort measures.  Synthroid has been discontinued.   11.  Anxiety Patient placed on Ativan as needed.  Patient has now been transitioned to comfort measures.   12.  Normocytic anemia No overt bleeding.  Status post 2 units packed red blood cells with hemoglobin stabilized at 10.2 on 02/09/2019 from 10.8 from 6.7 on 01/28/2019.  Patient has been transitioned to full comfort.  Patient  will be discharged to residential hospice home.  13.  Rheumatoid arthritis Patient maintained off her Humira during the hospitalization.  Patient now has subsequently been transitioned to full comfort measures.  Patient will be discharged to residential hospice home.   14.   Pulmonary nodule Outpatient follow-up.  15.  Hypomagnesemia/hypophosphatemia Repleted.  16.  Toxic metabolic encephalopathy/hypernatremia Patient noted to be declining and lethargic starting on 02/09/2019 however patient was arousable. Chest x-ray done on 02/08/2019- for any acute infiltrate however concerning for volume overload.  Patient underwent paracentesis with only 100 cc of fluid removed.  Patient placed on Lasix which has been discontinued.  Patient noted to be hypernatremic.  Ammonia levels within normal limits.  Patient seen by palliative care and patient transitioned to full comfort measures.  Patient subsequently placed on a Dilaudid drip.  Patient be discharged to residential hospice home.  17.  Severe protein calorie malnutrition Patient initially placed on nutritional supplementation.  Patient however continued to decline during the hospitalization seen by palliative care.  Decision was made to transition to full comfort measures.  Patient be discharged to residential hospice home.  69.  Debility Patient seen by physical therapy who are recommending SNF.  Patient declining and palliative care following and patient was subsequently transitioned to full comfort measures.  Patient will be discharged to residential hospice home.     Procedures:  Colonoscopy 7/19  EGD 7/31   Consultations:  GI  Nephrology  Palliative Care  Discharge Exam: Vitals:   02/10/19 2116 02/11/19 0425  BP: (!) 158/75 (!) 162/69  Pulse: 76 74  Resp: 20 20  Temp: 98.2 F (36.8 C) 98.9 F (37.2 C)  SpO2: 93% 91%    General: Lethargic but arousable.  Comfortable. Cardiovascular: Regular rate and rhythm no murmurs rubs or gallops Respiratory: Clear to auscultation bilaterally anterior lung fields.  Discharge Instructions   Discharge Instructions    Diet general   Complete by: As directed    Increase activity slowly   Complete by: As directed      Allergies as of 02/11/2019       Reactions   Cozaar [losartan Potassium]    Scallops [shellfish Allergy] Rash      Medication List    STOP taking these medications   amLODipine 5 MG tablet Commonly known as: NORVASC   CALCIUM 600+D PO   dicyclomine 10 MG capsule Commonly known as: BENTYL   metoCLOPramide 5 MG tablet Commonly known as: Reglan   metoprolol succinate 100 MG 24 hr tablet Commonly known as: TOPROL-XL   potassium chloride 10 MEQ CR capsule Commonly known as: MICRO-K   prochlorperazine 10 MG tablet Commonly known as: COMPAZINE   SODIUM BICARBONATE PO     TAKE these medications   bisacodyl 10 MG suppository Commonly known as: DULCOLAX Place 1 suppository (10 mg total) rectally daily as needed for moderate constipation.   colestipol 1 g tablet Commonly known as: COLESTID Take 1 tablet (1 g total) by mouth 2 (two) times daily.   escitalopram 10 MG tablet Commonly known as: LEXAPRO Take 10 mg by mouth daily.   fentaNYL 25 MCG/HR Commonly known as: [place] 1 patch onto the skin every 3 (three) days. Remove old patch before applying new patch.  Patch should be applied to intact, non-irritated, non-radiated skin on a flat surface (chest, back, flank, upper arm). Press firmly in place with palm of hand for 30 seconds.  If patch does not adhere to the skin,  overlay patch  with Tegaderm. Start taking on: February 12, 2019   glycopyrrolate 1 MG tablet Commonly known as: ROBINUL Take 1 tablet (1 mg total) by mouth every 4 (four) hours as needed (excessive secretions).   haloperidol 2 MG/ML solution Commonly known as: HALDOL Place 0.3 mLs (0.6 mg total) under the tongue every 4 (four) hours as needed for agitation (or delirium).   HYDROmorphone 2 mg/mL Soln Commonly known as: DILAUDID Inject 0.5 mg into the vein every 30 (thirty) minutes as needed (Distress, Discomfort).   HYDROmorphone 1 MG/ML injection Commonly known as: DILAUDID Inject 1 mL (1 mg total) into the vein every 2  (two) hours as needed for severe pain.   HYDROmorphone 25 mg in sodium chloride 0.9 % 47.5 mL Inject 0.5 mg/hr into the vein continuous.   levothyroxine 88 MCG tablet Commonly known as: SYNTHROID Take 88 mcg by mouth daily before breakfast.   LORazepam 2 MG/ML concentrated solution Commonly known as: ATIVAN Place 0.5 mLs (1 mg total) under the tongue every 4 (four) hours as needed for anxiety.   ondansetron 4 MG disintegrating tablet Commonly known as: ZOFRAN-ODT Take 1 tablet (4 mg total) by mouth every 6 (six) hours as needed for nausea. What changed: reasons to take this   oxybutynin 5 MG tablet Commonly known as: DITROPAN Take 0.5 tablets (2.5 mg total) by mouth 4 (four) times daily as needed for bladder spasms.   ProAir HFA 108 (90 Base) MCG/ACT inhaler Generic drug: albuterol Inhale 1-2 puffs into the lungs every 4 (four) hours as needed for wheezing.   temazepam 7.5 MG capsule Commonly known as: RESTORIL Take 1 capsule (7.5 mg total) by mouth at bedtime as needed for sleep.     ASK your doctor about these medications   diclofenac sodium 1 % Gel Commonly known as: VOLTAREN Apply 4 g topically 3 (three) times daily as needed for up to 7 days (arthritis pain). Ask about: Should I take this medication?      Allergies  Allergen Reactions  . Cozaar [Losartan Potassium]   . Scallops [Shellfish Allergy] Rash   Follow-up Information    MD AT [facility] Follow up.            The results of significant diagnostics from this hospitalization (including imaging, microbiology, ancillary and laboratory) are listed below for reference.    Significant Diagnostic Studies: Dg Abd 1 View  Result Date: 01/23/2019 CLINICAL DATA:  NG tube placement EXAM: ABDOMEN - 1 VIEW COMPARISON:  None. FINDINGS: Feeding tube with weighted tip in the gastric body. Stylet remains within the tube. IMPRESSION: Feeding tube with tip in the stomach. Electronically Signed   By: Suzy Bouchard  M.D.   On: 01/23/2019 16:26   US Renal  Result Date: 01/25/2019 CLINICAL DATA:  Acute kidney injury EXAM: RENAL / URINARY TRACT ULTRASOUND COMPLETE COMPARISON:  CT 01/06/2019 FINDINGS: Right Kidney: Renal measurements: 9.0 x 4.7 x 4.2 cm = volume: 92 mL . Echogenicity within normal limits. No shadowing stone, mass, or hydronephrosis visualized. Left Kidney: Renal measurements: 8.6 x 4.8 x 4.7 cm = volume: 102 mL. Echogenicity within normal limits. No shadowing stone, mass, or hydronephrosis visualized. Bladder: Appears normal for degree of bladder distention. Other: Limited views of the liver demonstrate diffusely echogenic hepatic parenchyma. Moderate volume ascites. IMPRESSION: 1. Unremarkable sonographic appearance of the kidneys without shadowing stone or hydronephrosis. 2. Moderate volume ascites. 3. Hepatic steatosis. Electronically Signed   By: Davina Poke M.D.   On: 01/25/2019 10:34  US Paracentesis  Result Date: 02/09/2019 INDICATION: Patient with history of rheumatoid arthritis, encephalopathy, hepatorenal syndrome, chronic kidney disease, prior CMV colitis, failure to thrive, cirrhosis by imaging, recurrent ascites. Request made for diagnostic paracentesis. EXAM: ULTRASOUND GUIDED DIAGNOSTIC PARACENTESIS MEDICATIONS: None COMPLICATIONS: None immediate. PROCEDURE: Informed written consent was obtained from the patient's husband after a discussion of the risks, benefits and alternatives to treatment. A timeout was performed prior to the initiation of the procedure. Initial ultrasound scanning demonstrates a trace amount of ascites within the left upper to mid abdominal quadrant. The left upper to mid abdomen was prepped and draped in the usual sterile fashion. 1% lidocaine was used for local anesthesia. Following this, a 19 gauge, 7-cm, Yueh catheter was introduced. An ultrasound image was saved for documentation purposes. The paracentesis was performed. The catheter was removed and a dressing  was applied. The patient tolerated the procedure well without immediate post procedural complication. FINDINGS: A total of approximately 100 cc of yellow fluid was removed. Samples were sent to the laboratory as requested by the clinical team. IMPRESSION: Successful ultrasound-guided diagnostic paracentesis yielding 100 ccof peritoneal fluid. Read by: Rowe Robert, PA-C Electronically Signed   By: Jerilynn Mages.  Shick M.D.   On: 02/08/2019 17:17   US Paracentesis  Result Date: 01/25/2019 INDICATION: Patient with history of rheumatoid arthritis, chronic kidney disease, prior CMV colitis, failure to thrive, cirrhosis by imaging, recurrent ascites. Request made for diagnostic paracentesis. EXAM: ULTRASOUND GUIDED DIAGNOSTIC PARACENTESIS MEDICATIONS: None COMPLICATIONS: None immediate. PROCEDURE: Informed written consent was obtained from the patient after a discussion of the risks, benefits and alternatives to treatment. A timeout was performed prior to the initiation of the procedure. Initial ultrasound scanning demonstrates a small to moderate amount of ascites within the right lower abdominal quadrant. The right lower abdomen was prepped and draped in the usual sterile fashion. 1% lidocaine was used for local anesthesia. Following this, a 19 gauge, 7-cm, Yueh catheter was introduced. An ultrasound image was saved for documentation purposes. The paracentesis was performed. The catheter was removed and a dressing was applied. The patient tolerated the procedure well without immediate post procedural complication. FINDINGS: A total of approximately 250 cc of clear, light yellow fluid was removed. Samples were sent to the laboratory as requested by the clinical team. IMPRESSION: Successful ultrasound-guided diagnostic paracentesis yielding 250 cc of peritoneal fluid. Read by: Rowe Robert, PA-C Electronically Signed   By: Jerilynn Mages.  Shick M.D.   On: 01/25/2019 11:37   US Paracentesis  Result Date: 01/12/2019 INDICATION: Patient  with history of cirrhosis by imaging, chronic kidney disease, ascites. Request made for diagnostic and therapeutic paracentesis. EXAM: ULTRASOUND GUIDED DIAGNOSTIC AND THERAPEUTIC PARACENTESIS MEDICATIONS: None COMPLICATIONS: None immediate. PROCEDURE: Informed written consent was obtained from the patient after a discussion of the risks, benefits and alternatives to treatment. A timeout was performed prior to the initiation of the procedure. Initial ultrasound scanning demonstrates a moderate to large amount of ascites within the right lower abdominal quadrant. The right lower abdomen was prepped and draped in the usual sterile fashion. 1% lidocaine was used for local anesthesia. Following this, a 6 Fr Safe-T-Centesis catheter was introduced. An ultrasound image was saved for documentation purposes. The paracentesis was performed. The catheter was removed and a dressing was applied. The patient tolerated the procedure well without immediate post procedural complication. FINDINGS: A total of approximately 4.5 liters of clear, yellow fluid was removed. Samples were sent to the laboratory as requested by the clinical team. IMPRESSION: Successful ultrasound-guided diagnostic  and therapeutic paracentesis yielding 4.5 liters of peritoneal fluid. Read by: Rowe Robert, PA-C Electronically Signed   By: Jacqulynn Cadet M.D.   On: 01/12/2019 16:44   Dg Chest Port 1 View  Result Date: 02/09/2019 CLINICAL DATA:  77 yoF with hypoxia. PMH HTN, HLD, COPD, asthma, RA previously on immune modulators, h/o necrotizing glomerulonephritis/vasculitis (+ ANCA), and pancytopenia secondary to cyclophosphamide. She presented to the ED with complaints .*comment was truncated* EXAM: PORTABLE CHEST 1 VIEW COMPARISON:  Chest radiographs dated 02/08/2019, 01/11/2019 FINDINGS: Stable cardiomediastinal contours. Right PICC tip overlies the distal SVC. Bandlike opacity in the right mid lung likely reflecting atelectasis. Improved aeration of  the bilateral lung bases with persistent opacification of the left hemidiaphragm possibly representing atelectasis versus infiltrate. Diffuse bilateral interstitial opacities may represent mild pulmonary edema. Probable small bilateral pleural effusions, decreased from prior. IMPRESSION: 1. Improved aeration of the bilateral lung bases with persistent opacification of the left hemidiaphragm possibly representing atelectasis versus infiltrate. 2. Mild bilateral diffuse interstitial opacities suspicious for pulmonary edema. Probable small bilateral pleural effusions, decreased from prior. Electronically Signed   By: Audie Pinto M.D.   On: 02/09/2019 13:29   Dg Chest Port 1 View  Result Date: 02/08/2019 CLINICAL DATA:  Pneumonia.  COPD. EXAM: PORTABLE CHEST 1 VIEW COMPARISON:  Chest x-ray dated January 11, 2019 FINDINGS: There are moderate-sized bilateral pleural effusions, both increased in size from prior x-ray. There is a well-positioned right-sided PICC line with tip terminating near the distal SVC. The heart is enlarged. There are bibasilar airspace opacities favored to represent atelectasis. There is generalized volume overload with findings concerning for pulmonary edema. There is no pneumothorax. IMPRESSION: 1. Moderate-sized bilateral pleural effusions. 2. Cardiomegaly with pulmonary edema. 3. Well-positioned right-sided PICC line. 4. Bibasilar airspace opacities favored to represent compressive atelectasis, however an infiltrate is not excluded. Electronically Signed   By: Constance Holster M.D.   On: 02/08/2019 19:37   US Abdominal Pelvic Art/vent Flow Doppler  Result Date: 01/25/2019 CLINICAL DATA:  Inpatient.  Cirrhosis. EXAM: DUPLEX ULTRASOUND OF LIVER AND TIPS SHUNT TECHNIQUE: Color and duplex Doppler ultrasound was performed to evaluate the hepatic in-flow and out-flow vessels. COMPARISON:  01/21/2019 liver Doppler. FINDINGS: Portal Vein Velocities Main: Minimal to-and-fro flow identified  within the main portal vein, approximately 10 cm/sec. No discrete main portal vein thrombosis identified on the grayscale images. Right:  9 cm/sec Left:  12 cm/sec Hepatopetal flow in the right and left portal veins. IVC: Patent with normal respiratory phasicity. Hepatic Vein Velocities Right:  70 cm/sec Mid:  43 cm/sec Left:  40 cm/sec Patent hepatic veins with appropriate flow directions. Splenic Vein: Minimal to and fro flow identified within the splenic vein approximately 10 cm/sec. Hepatic Artery: 115 Ascities: Moderate volume ascites, most prominent in the right lower quadrant. Varices: Patent paraumbilical varix identified with hepatofugal flow. Other findings: Liver surface is diffusely irregular and liver parenchyma is diffusely coarsened and echogenic, compatible with cirrhosis with possible superimposed steatosis. No liver mass demonstrated. IMPRESSION: 1. Hepatic cirrhosis. Moderate volume ascites, most prominent in the right lower quadrant. 2. Minimal to and fro flow within the main portal and splenic veins. No discrete thrombosis appreciated. 3. Slow hepatopetal flow within the right and left portal veins. 4. Patent paraumbilical varix with hepatofugal flow. Electronically Signed   By: Ilona Sorrel M.D.   On: 01/25/2019 17:33   US Liver Doppler  Result Date: 01/22/2019 CLINICAL DATA:  Abnormal LFTs, fatty liver on MR, nodular liver contour suggesting possible  cirrhosis EXAM: DUPLEX ULTRASOUND OF LIVER TECHNIQUE: Color and duplex Doppler ultrasound was performed to evaluate the hepatic in-flow and out-flow vessels. Technologist describes technically difficult study secondary to patient intolerance and steatohepatitis. COMPARISON:  MR 01/07/2019, CT 01/06/2019 FINDINGS: Portal Vein 0.5 cm diameter.  Velocities: Main: None detected Right:  11.3 cm/sec retrograde (hepatofugal) Left:  16.4 cm/sec antegrade (hepatopetal) Hepatic Vein Velocities (all hepatofugal): Right: Not recorded Middle:  35 cm/sec  Left:  21 cm/sec IVC: 42 cm/sec Hepatic Artery Velocity:  131 cm/sec Spleen 5.5 x 7.4 x 2.7 cm (volume = 58 cm^3). Splenic Vein: No evidence of occlusion or thrombus. Velocity: 19 cm/sec Varices: Paraumbilical vein identified Ascites: Moderate IMPRESSION: 1. Limited study, with no flow confirmed in the main portal vein; portal vein patency had been demonstrated on MR 01/07/2019. There is retrograde right portal vein flow, antegrade flow in the left portal vein and recanalized paraumbilical portosystemic collateral. 2. Moderate abdominal ascites Electronically Signed   By: Lucrezia Europe M.D.   On: 01/22/2019 10:14   [place] Ekg Site Rite  Result Date: 01/27/2019 If Site Rite image not attached, placement could not be confirmed due to current cardiac rhythm.  [place] Ekg Site Rite  Result Date: 01/21/2019 If Site Rite image not attached, placement could not be confirmed due to current cardiac rhythm.   Microbiology: Recent Results (from the past 240 hour(s))  SARS CORONAVIRUS 2 Nasal Swab Aptima Multi Swab     Status: None   Collection Time: 02/07/19 11:27 AM   Specimen: Aptima Multi Swab; Nasal Swab  Result Value Ref Range Status   SARS Coronavirus 2 NEGATIVE NEGATIVE Final    Comment: (NOTE) SARS-CoV-2 target nucleic acids are NOT DETECTED. The SARS-CoV-2 RNA is generally detectable in upper and lower respiratory specimens during the acute phase of infection. Negative results do not preclude SARS-CoV-2 infection, do not rule out co-infections with other pathogens, and should not be used as the sole basis for treatment or other patient management decisions. Negative results must be combined with clinical observations, patient history, and epidemiological information. The expected result is Negative. Fact Sheet for Patients: SugarRoll.be Fact Sheet for Healthcare Providers: https://www.woods-mathews.com/ This test is not yet approved or cleared by the [place] FDA and  has been authorized for detection and/or diagnosis of SARS-CoV-2 by FDA under an Emergency Use Authorization (EUA). This EUA will remain  in effect (meaning this test can be used) for the duration of the COVID-19 declaration under Section 56 4(b)(1) of the Act, 21 U.S.C. section 360bbb-3(b)(1), unless the authorization is terminated or revoked sooner. Performed at [facility], [place] [address], [place], [place] [address]   Body fluid culture     Status: None (Preliminary result)   Collection Time: 02/08/19  5:36 PM   Specimen: Peritoneal Fluid  Result Value Ref Range Status   Specimen Description   Final    PERITONEAL Performed at [facility] [address], [place], [place] [address]    Special Requests   Final    NONE Performed at [facility], [place] [address], [place], [place] [address]    Gram Stain   Final    FEW WBC PRESENT, PREDOMINANTLY MONONUCLEAR NO ORGANISMS SEEN    Culture   Final    NO GROWTH 3 DAYS Performed at [facility] [address], [place], [place] [address]    Report Status PENDING  Incomplete     Labs: Basic Metabolic Panel: Recent Labs  Lab 02/05/19 0411 02/06/19 0357 02/07/19 0518 02/08/19 0411 02/09/19 1225  NA 147* 148* 151* 147* 152*  K 3.9 3.8 3.8 4.2 4.1  CL 105 106 110 111 118*  CO2 '31 30 28 26 25  '$ GLUCOSE 104* 103* 110* 98 111*  BUN 114* 110* 96* 105* 94*  CREATININE 2.06* 1.87* 1.79* 1.65* 1.59*  CALCIUM 9.6 9.6 9.6 9.3 9.6  MG  --  2.3 2.4 2.3 2.3  PHOS 4.1 4.0 4.2 4.4 4.1   Liver Function Tests: Recent Labs  Lab 02/05/19 0411 02/06/19 0357 02/07/19 0518 02/09/19 1225  AST 101* 105* 95*  --   ALT 67* 70* 70*  --   ALKPHOS 101 107 101  --   BILITOT 1.6* 1.4* 1.6*  --   PROT 6.2* 6.0* 6.2*  --   ALBUMIN 4.0 3.7 3.7 4.0   No results for input(s): LIPASE, AMYLASE in the last 168 hours. Recent Labs  Lab 02/05/19 1538 02/09/19 1226  AMMONIA 21 26    CBC: Recent Labs  Lab 02/07/19 0518 02/09/19 1225  WBC 9.5 11.1*  NEUTROABS 7.7 9.2*  HGB 10.8* 10.2*  HCT 35.5* 34.0*  MCV 104.1* 107.6*  PLT 120* 127*   Cardiac Enzymes: No results for input(s): CKTOTAL, CKMB, CKMBINDEX, TROPONINI in the last 168 hours. BNP: BNP (last 3 results) No results for input(s): BNP in the last 8760 hours.  ProBNP (last 3 results) No results for input(s): PROBNP in the last 8760 hours.  CBG: No results for input(s): GLUCAP in the last 168 hours.     Signed:  Irine Seal MD.  [facility] 02/11/2019, 2:43 PM

## 2019-02-11 NOTE — TOC Transition Note (Signed)
Transition of Care [facility]) - CM/SW Discharge Note   Patient Details  Name: Kaylee Rasmussen MRN: [id number] Date of Birth: [birth year]  Transition of Care [facility]) CM/SW Contact:  Wende Neighbors, LCSW Phone Number: 02/11/2019, 3:05 PM   Clinical Narrative:   Patient to discharge to [facility] via [facility]. Family aware of discharge . RN to call [phone] ([phone]) for report.    Final next level of care: [facility] Barriers to Discharge: No Barriers Identified   Patient Goals and CMS Choice Patient states their goals for this hospitalization and ongoing recovery are:: go home CMS Medicare.gov Compare Post Acute Care list provided to:: Patient Choice offered to / list presented to : Spouse, Patient  Discharge Placement              [facility] Patient to be transferred to facility by: ptar Name of family member notified: husband Patient and family notified of of transfer: 02/11/19  Discharge Plan and Services   Discharge Planning Services: CM Consult Post Acute Care Choice: [place] Determinants of Health (SDOH) Interventions     Readmission Risk Interventions No flowsheet data found.

## 2019-02-11 NOTE — Plan of Care (Signed)
  Problem: Health Behavior/Discharge Planning: Goal: Ability to manage health-related needs will improve Outcome: Not Progressing   Problem: Clinical Measurements: Goal: Ability to maintain clinical measurements within normal limits will improve Outcome: Not Progressing   Problem: Elimination: Goal: Will not experience complications related to bowel motility Outcome: Not Progressing Goal: Will not experience complications related to urinary retention Outcome: Not Progressing   Problem: Skin Integrity: Goal: Risk for impaired skin integrity will decrease Outcome: Not Progressing

## 2019-02-11 NOTE — Progress Notes (Signed)
Pt receiving continuous IV pain med. Will return when her transport is here to flush line.

## 2019-02-11 NOTE — Progress Notes (Signed)
PMT no charge note  Chart reviewed Medication history noted Discussed with care management, awaiting transfer to residential hospice No additional PMT specific recommendations at this time.   Loistine Chance MD [facility] health palliative medicine team [phone].

## 2019-02-12 LAB — BODY FLUID CULTURE: Culture: NO GROWTH

## 2019-02-14 ENCOUNTER — Ambulatory Visit: Payer: PPO | Admitting: Gastroenterology

## 2019-02-22 DEATH — deceased

## 2019-12-26 IMAGING — US US HEPATIC LIVER DOPPLER
1 series · 13 of 25 positions shown · non-contrast
Comparison: MR 01/07/2019, CT 01/06/2019

CLINICAL DATA: Abnormal LFTs, fatty liver on MR, nodular liver
contour suggesting possible cirrhosis

EXAM:
DUPLEX ULTRASOUND OF LIVER
TECHNIQUE: Color and duplex Doppler ultrasound was performed to evaluate the
hepatic in-flow and out-flow vessels. Technologist describes
technically difficult study secondary to patient intolerance and
steatohepatitis.

[Series 1: us hepatic liver doppler · 13 of 84 slices shown]
[im 1/84]
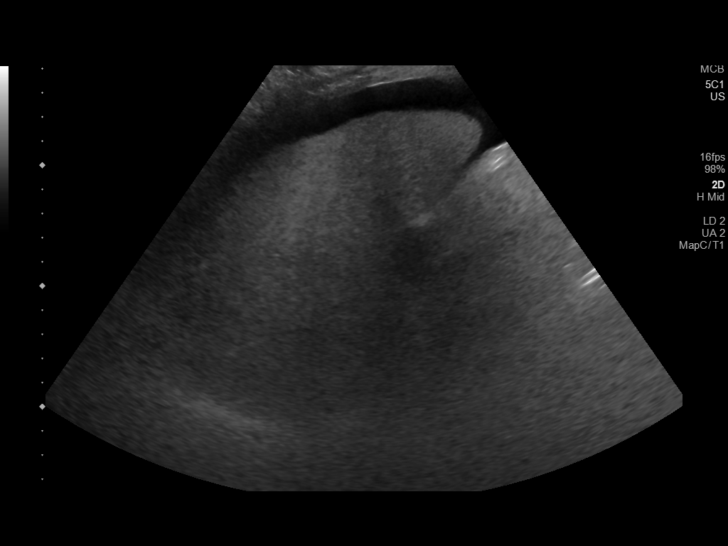
[im 7/84]
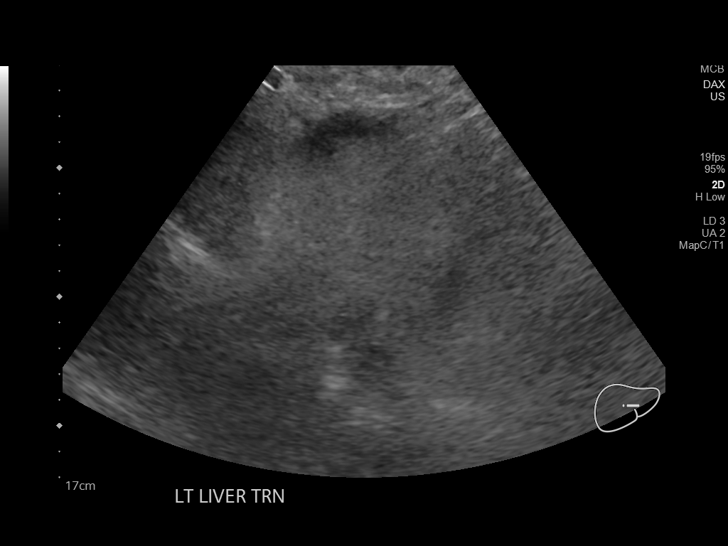
[im 14/84]
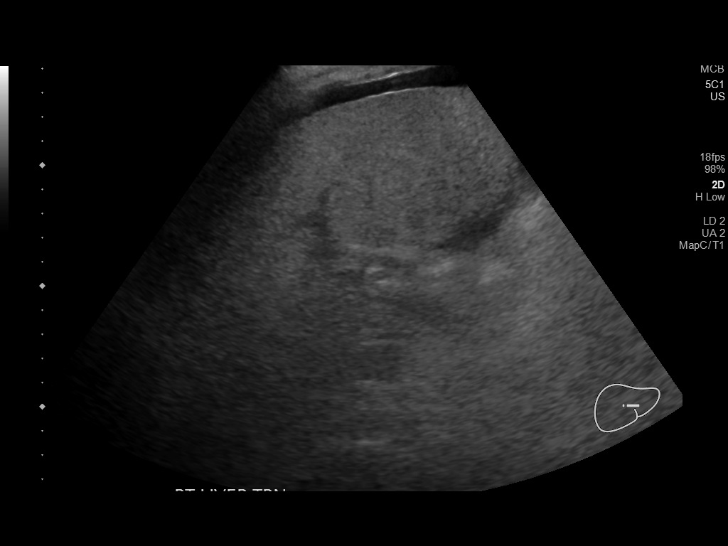
[im 21/84]
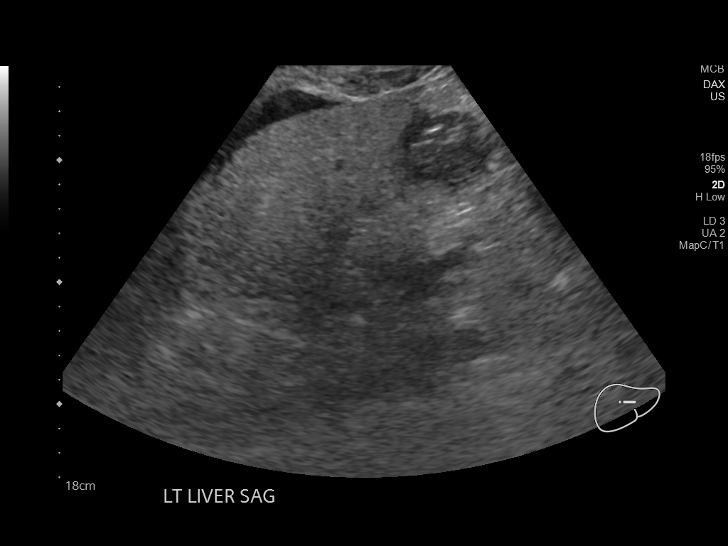
[im 28/84]
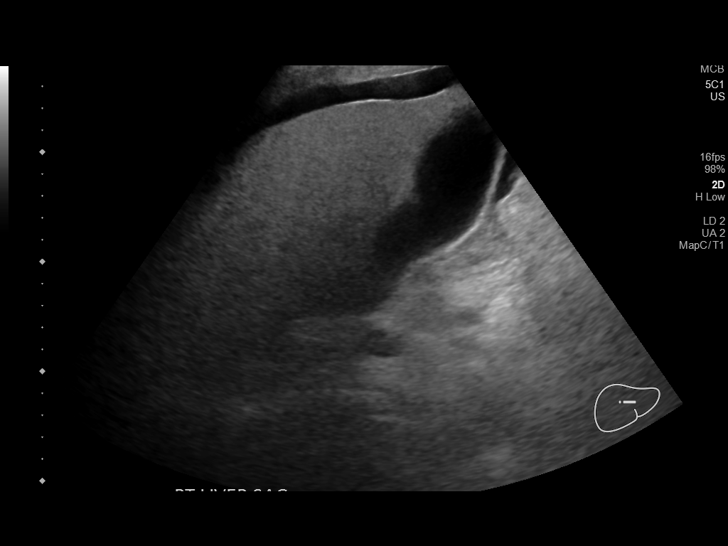
[im 35/84]
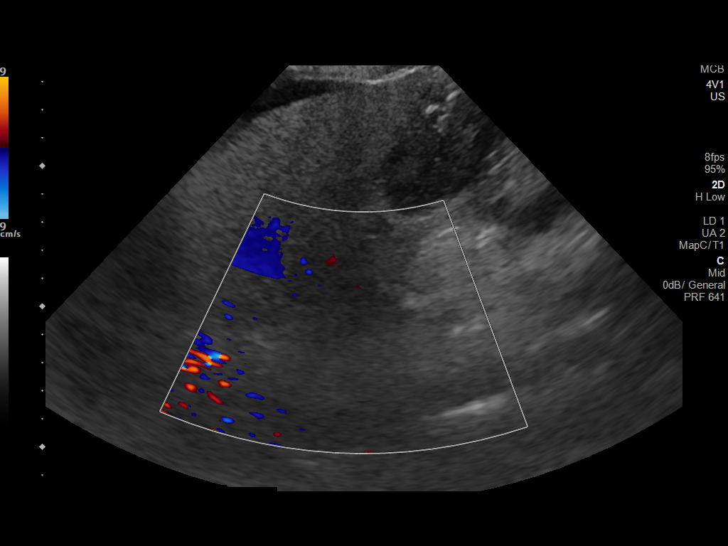
[im 42/84]
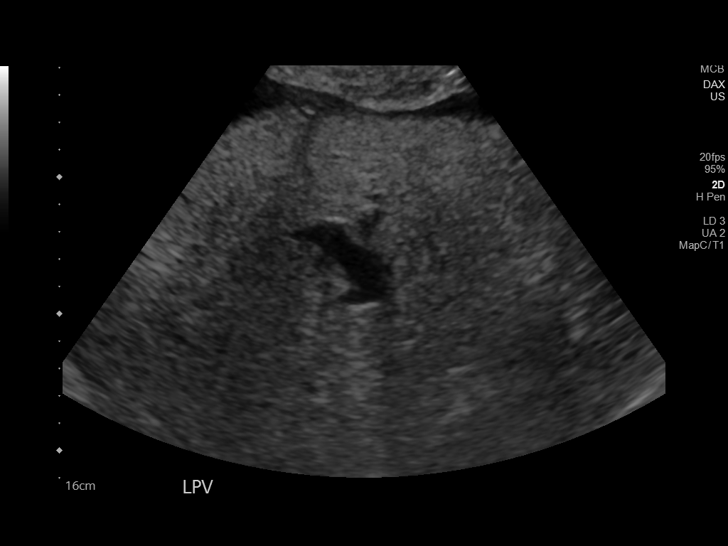
[im 49/84]
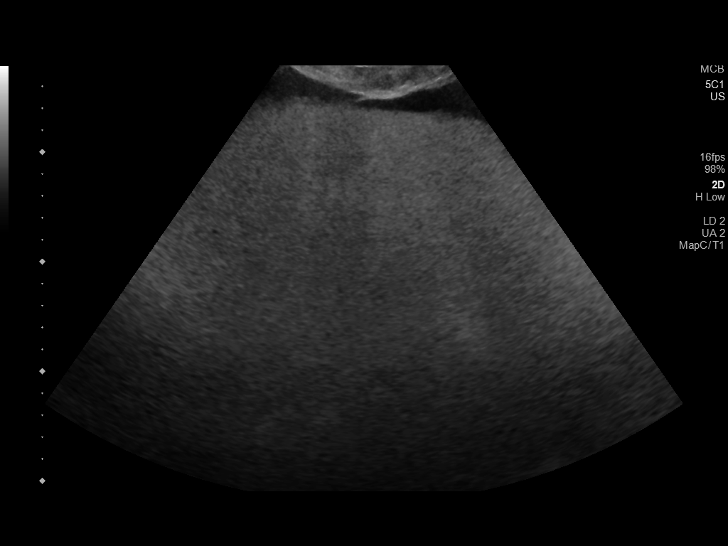
[im 56/84]
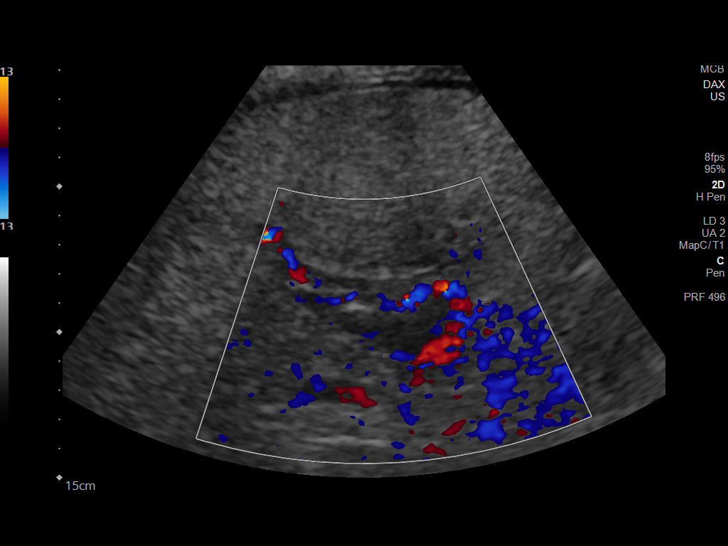
[im 63/84]
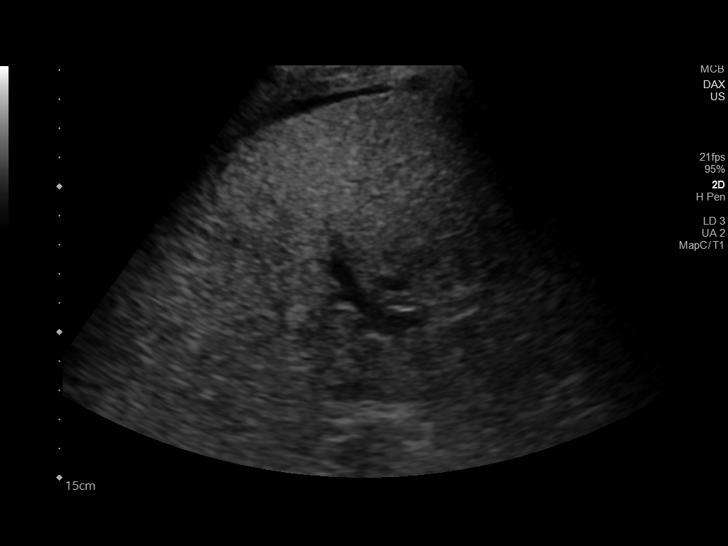
[im 70/84]
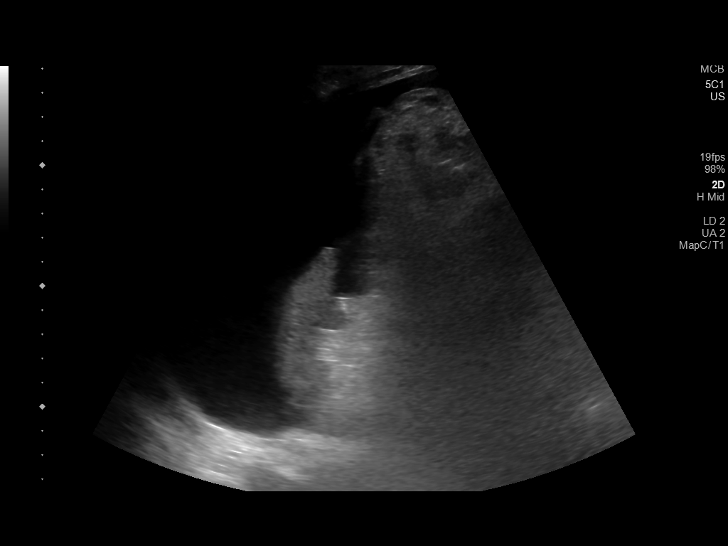
[im 77/84]
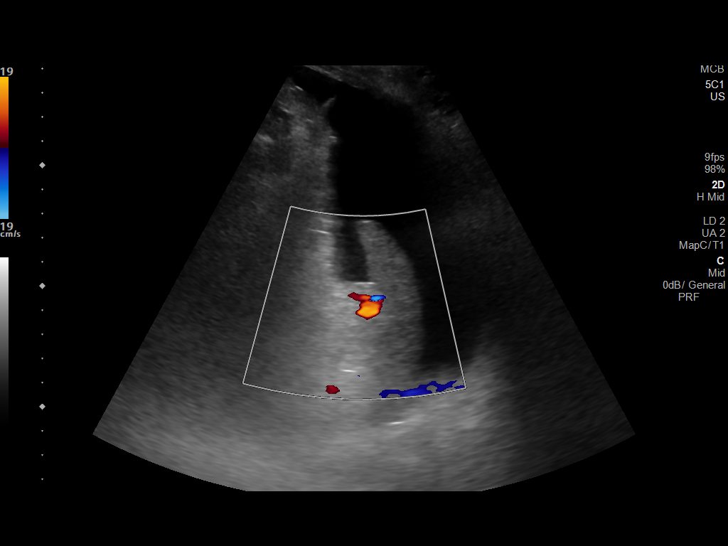
[im 84/84]
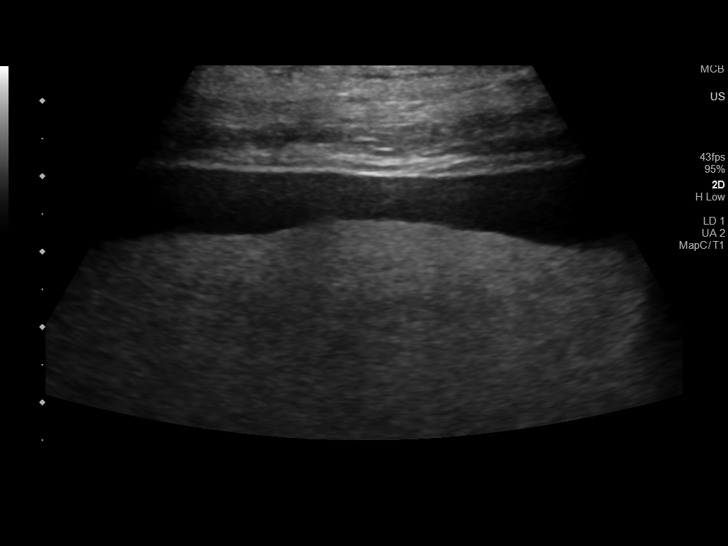

[13 of 25 positions shown; findings below may reference images not displayed]

FINDINGS: Portal Vein 0.5 cm diameter.  Velocities:

Main: None detected

Right:  11.3 cm/sec retrograde (hepatofugal)

Left:  16.4 cm/sec antegrade (hepatopetal)

Hepatic Vein Velocities (all hepatofugal):

Right: Not recorded

Middle:  35 cm/sec

Left:  21 cm/sec

IVC: 42 cm/sec

Hepatic Artery Velocity:  131 cm/sec

Spleen 5.5 x 7.4 x 2.7 cm (volume = 58 cm^3). Splenic Vein: No
evidence of occlusion or thrombus. Velocity: 19 cm/sec

Varices: Paraumbilical vein identified

Ascites: Moderate
IMPRESSION: 1. Limited study, with no flow confirmed in the main portal vein;
portal vein patency had been demonstrated on MR 01/07/2019.
There is retrograde right portal vein flow, antegrade flow in the
left portal vein and recanalized paraumbilical portosystemic
collateral.
2. Moderate abdominal ascites

## 2019-12-30 IMAGING — US US RENAL
1 series · 14 of 25 positions shown · non-contrast
Comparison: CT 01/06/2019

CLINICAL DATA: Acute kidney injury

EXAM:
RENAL / URINARY TRACT ULTRASOUND COMPLETE

[Series 1: us renal · 14 of 51 slices shown]
[im 1/51]
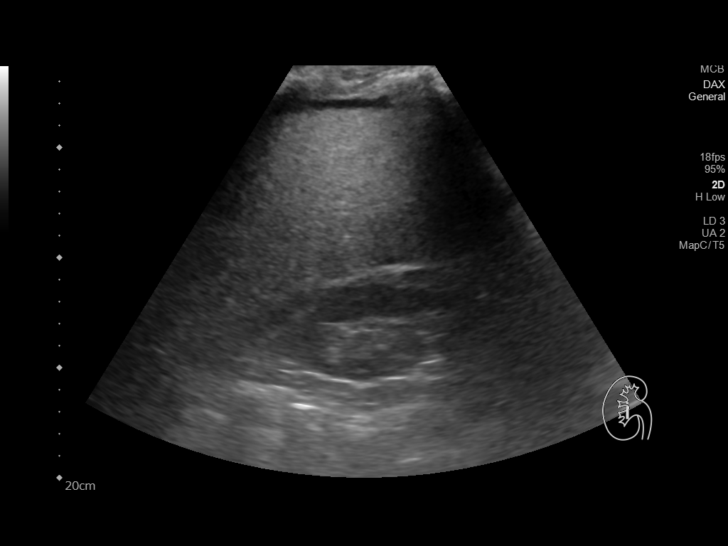
[im 5/51]
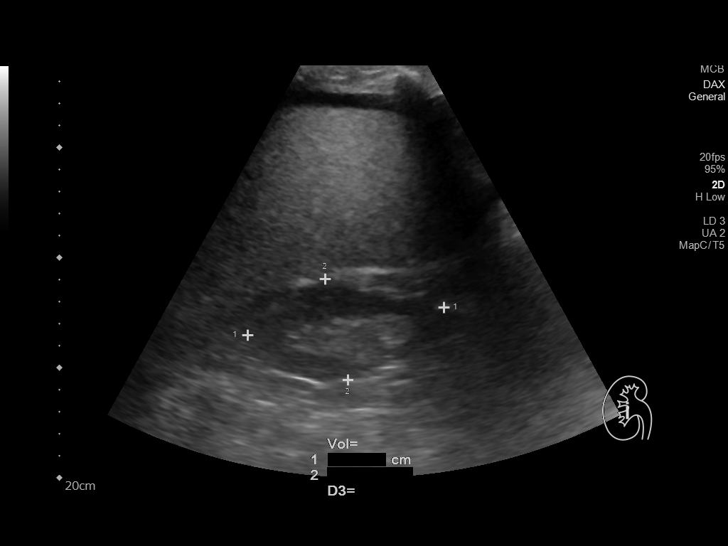
[im 9/51]
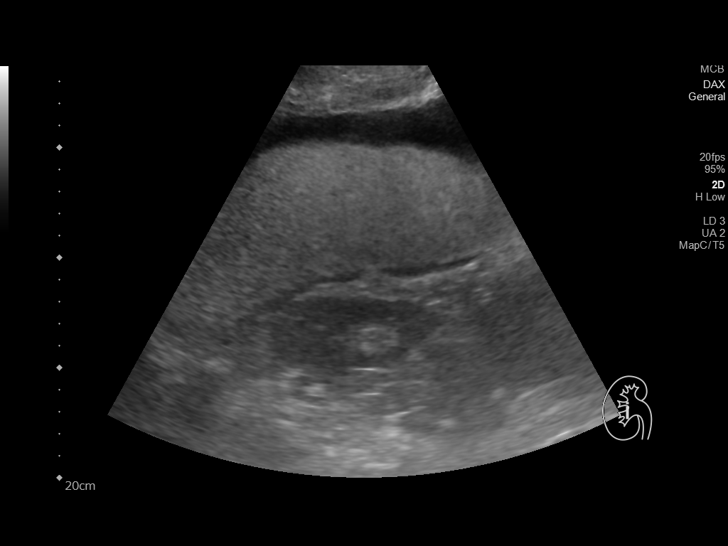
[im 13/51]
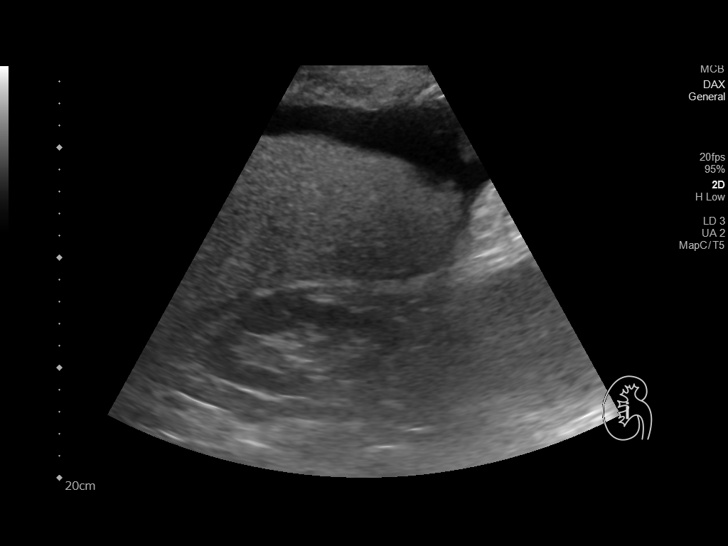
[im 17/51]
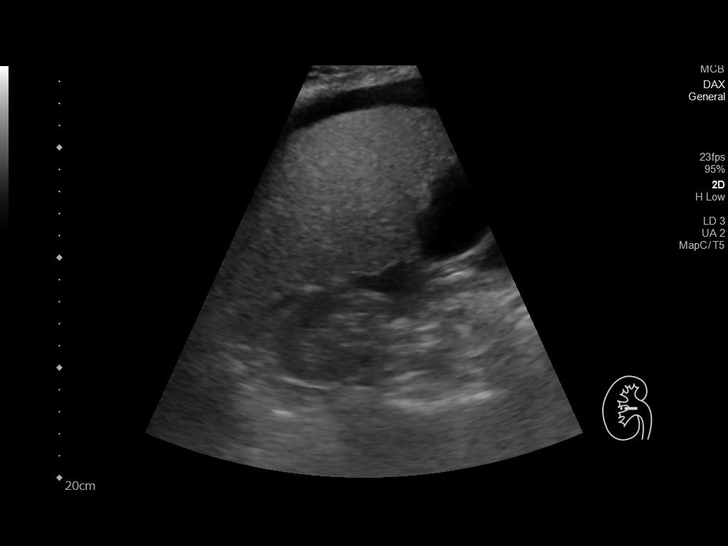
[im 19/51]
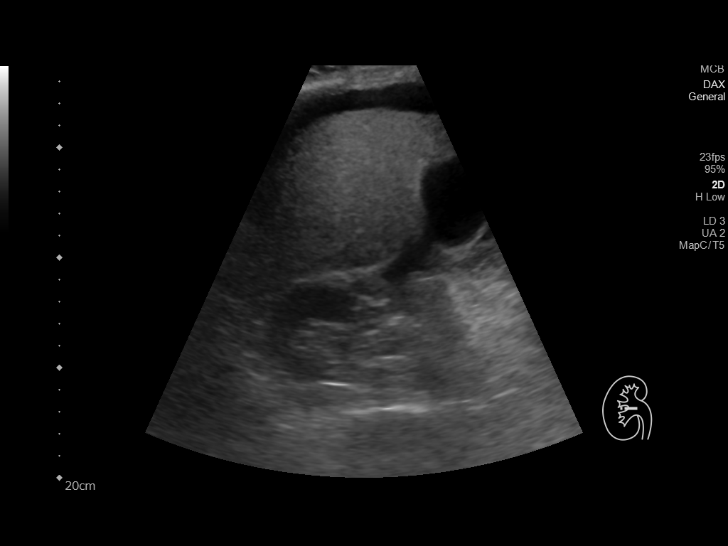
[im 23/51]
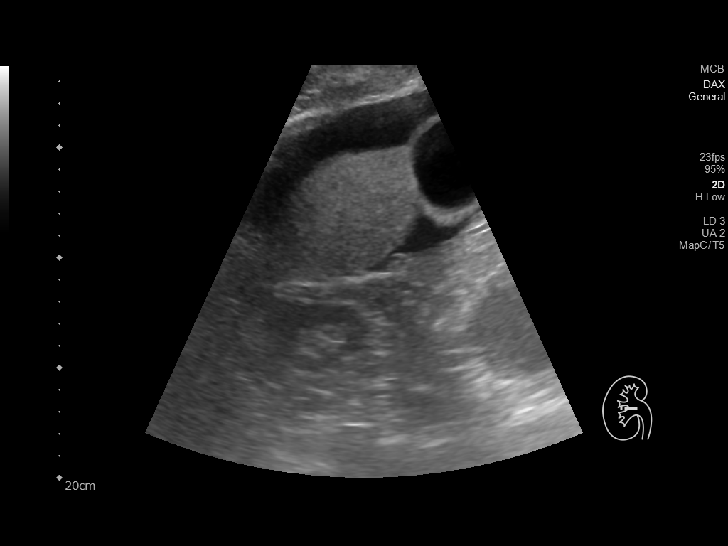
[im 28/51]
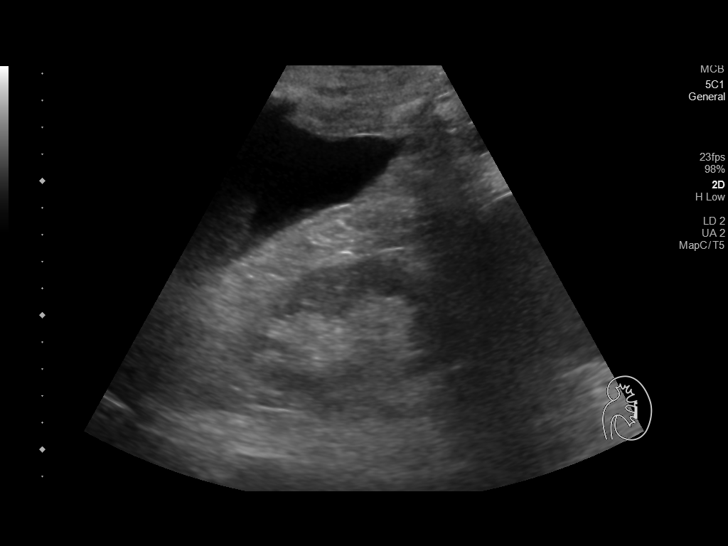
[im 32/51]
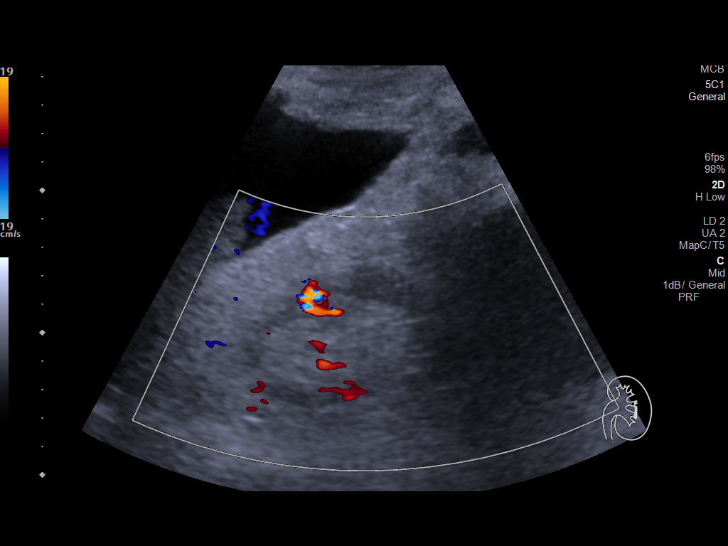
[im 34/51]
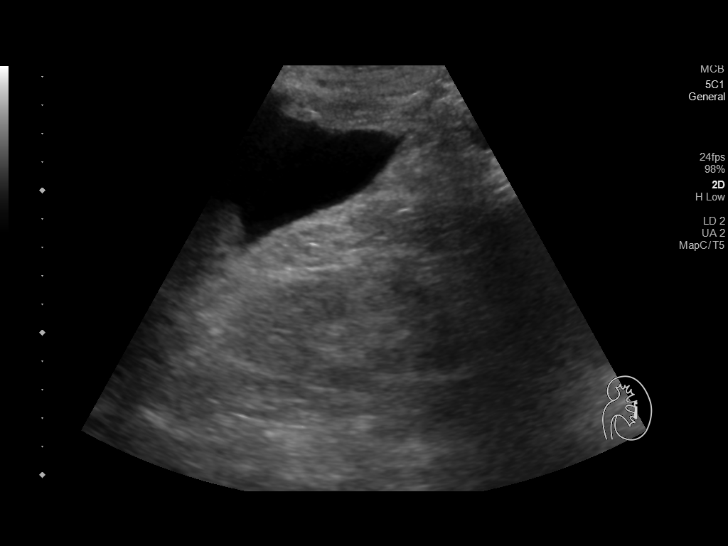
[im 38/51]
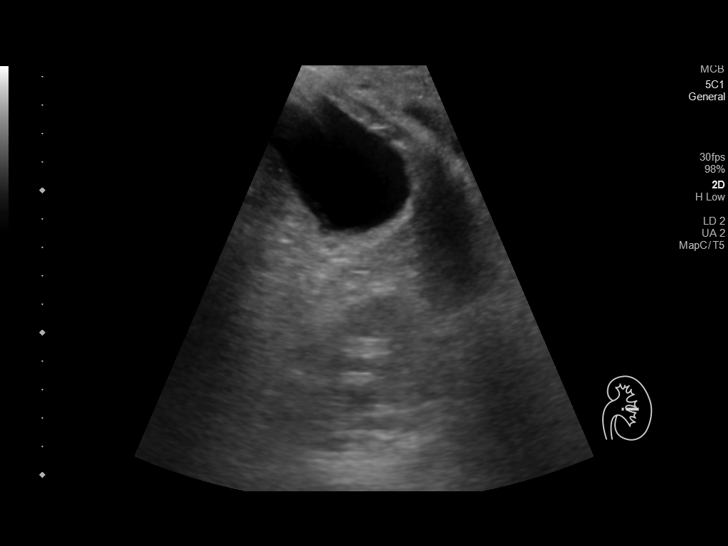
[im 42/51]
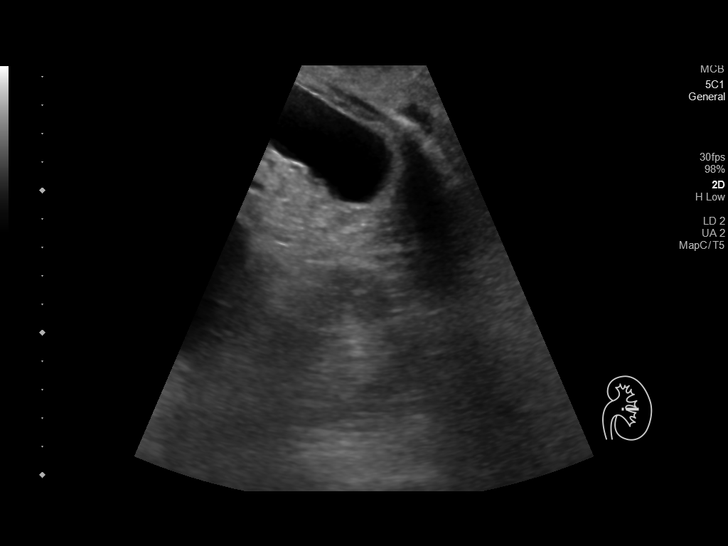
[im 46/51]
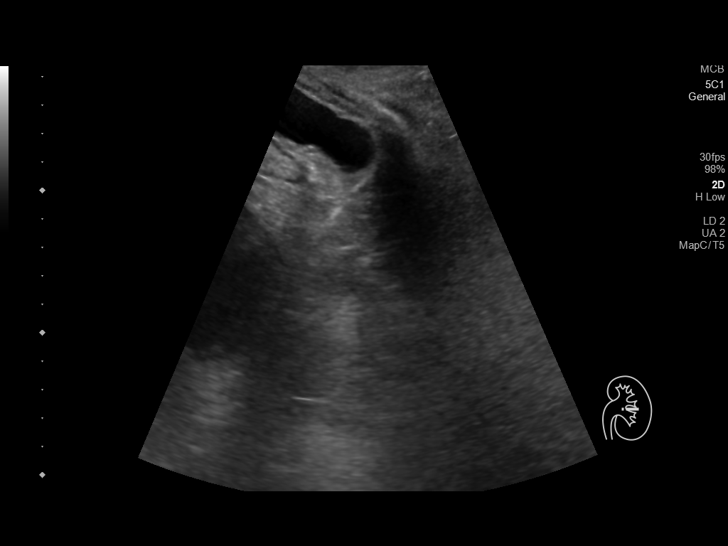
[im 51/51]
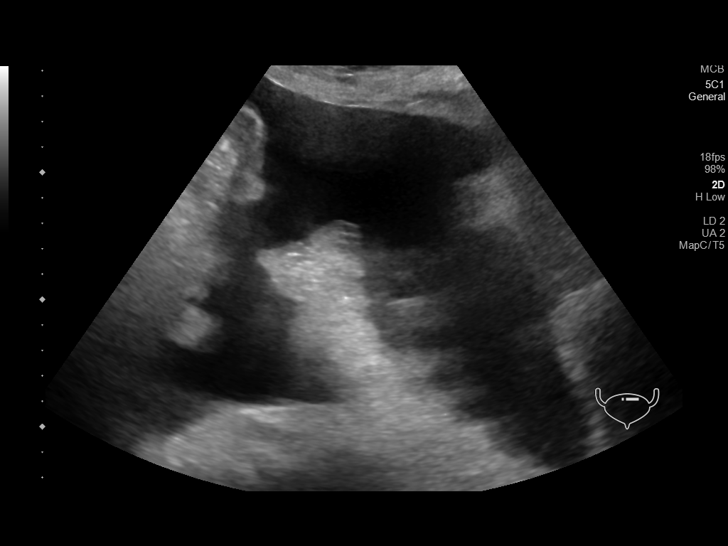

[14 of 25 positions shown; findings below may reference images not displayed]

FINDINGS: Right Kidney:

Renal measurements: 9.0 x 4.7 x 4.2 cm = volume: 92 mL .
Echogenicity within normal limits. No shadowing stone, mass, or
hydronephrosis visualized.

Left Kidney:

Renal measurements: 8.6 x 4.8 x 4.7 cm = volume: 102 mL.
Echogenicity within normal limits. No shadowing stone, mass, or
hydronephrosis visualized.

Bladder:

Appears normal for degree of bladder distention.

Other: Limited views of the liver demonstrate diffusely echogenic
hepatic parenchyma. Moderate volume ascites.
IMPRESSION: 1. Unremarkable sonographic appearance of the kidneys without
shadowing stone or hydronephrosis.
2. Moderate volume ascites.
3. Hepatic steatosis.

## 2019-12-30 IMAGING — US PARACENTESIS WITH ULTRASOUND GUIDANCE
1 series · 6 of 6 positions shown · non-contrast
Comparison: none

INDICATION: Patient with history of rheumatoid arthritis, chronic kidney
disease, prior CMV colitis, failure to thrive, cirrhosis by imaging,
recurrent ascites. Request made for diagnostic paracentesis.

[Series 1: paracentesis with ultrasound guidance · 6 of 6 slices shown]
[im 1/6]
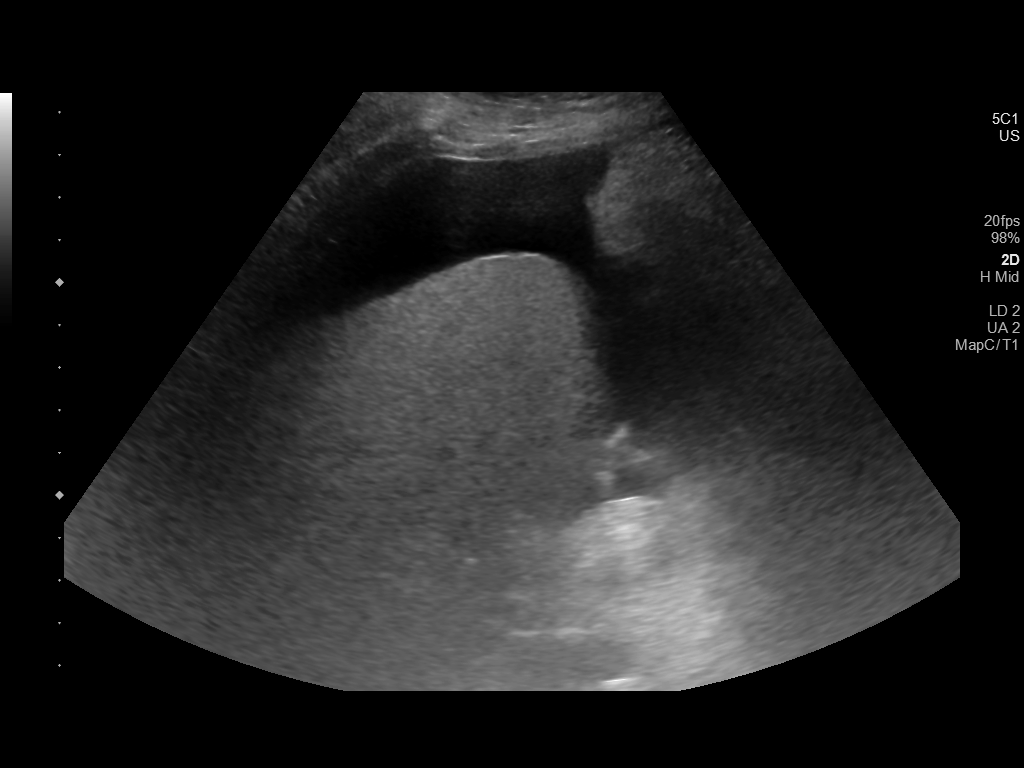
[im 2/6]
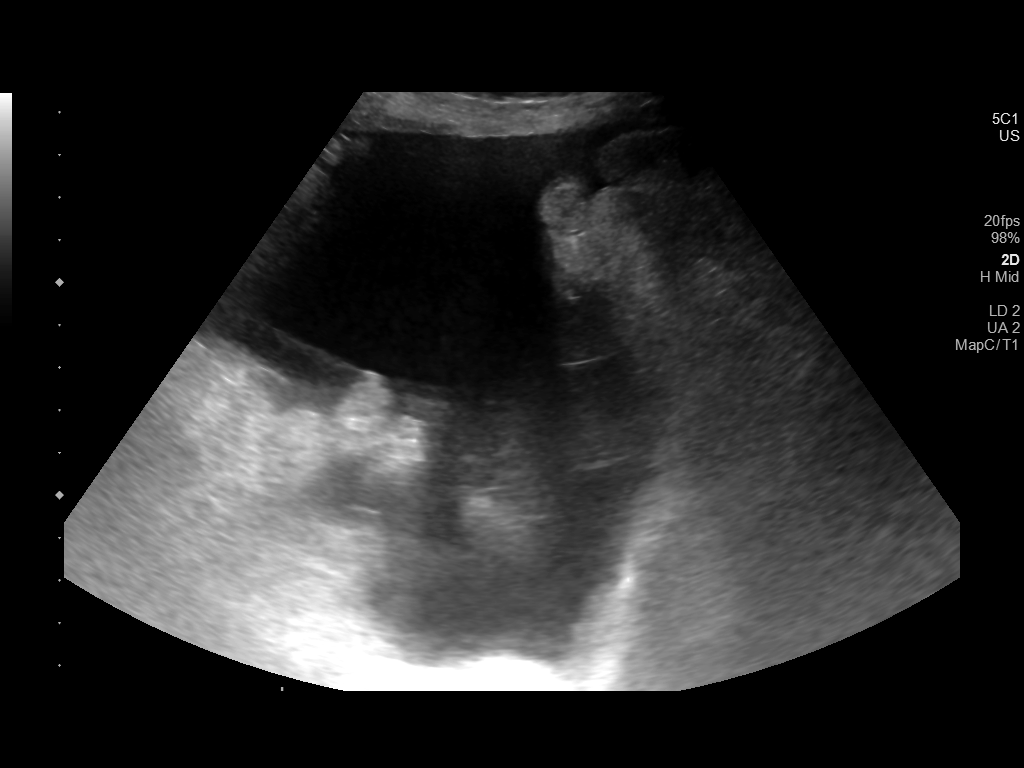
[im 3/6]
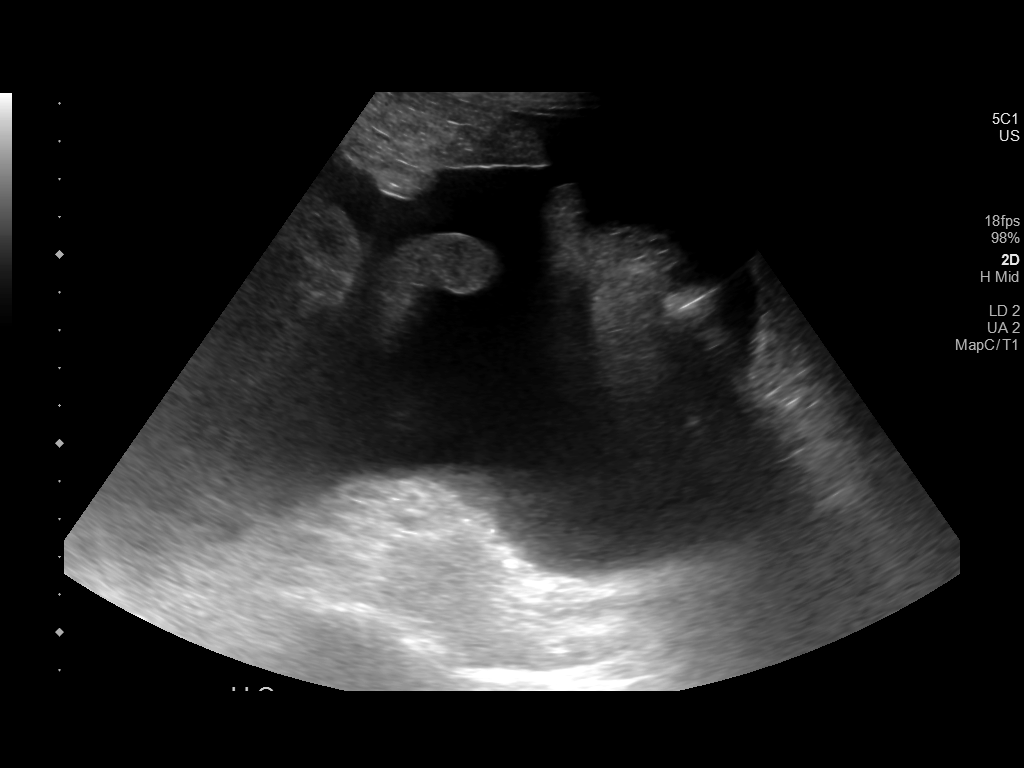
[im 4/6]
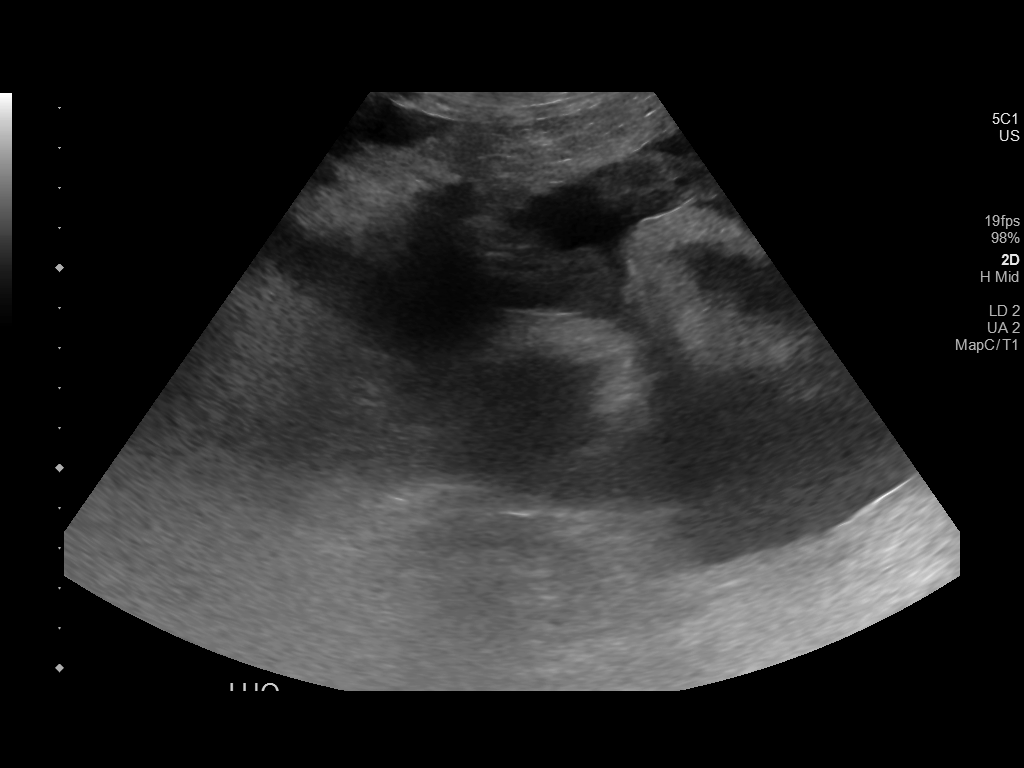
[im 5/6]
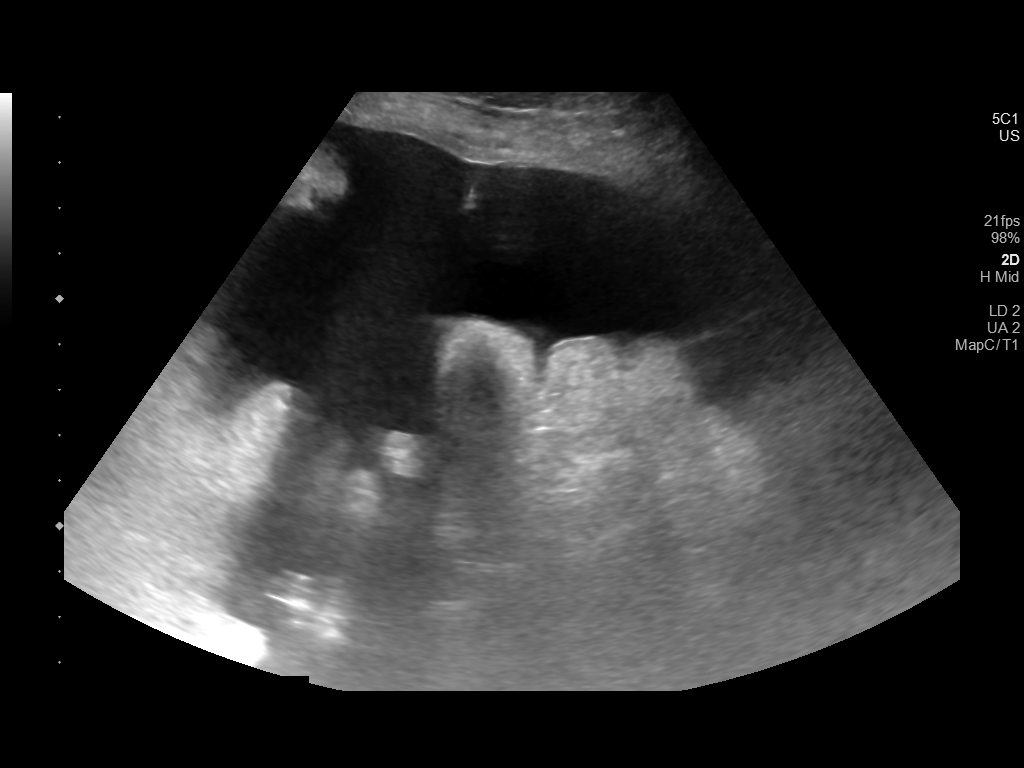
[im 6/6]
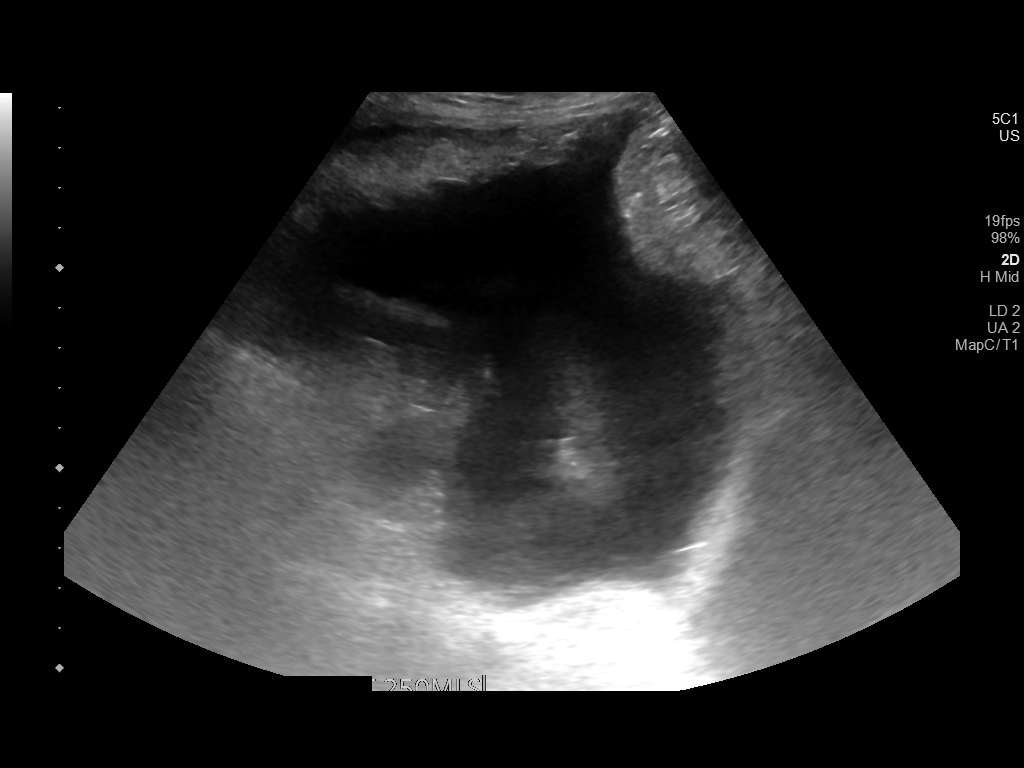

[6 of 6 positions shown; findings below may reference images not displayed]

EXAM:
ULTRASOUND GUIDED DIAGNOSTIC PARACENTESIS

MEDICATIONS:
None

COMPLICATIONS:
None immediate.

PROCEDURE:
Informed written consent was obtained from the patient after a
discussion of the risks, benefits and alternatives to treatment. A
timeout was performed prior to the initiation of the procedure.

Initial ultrasound scanning demonstrates a small to moderate amount
of ascites within the right lower abdominal quadrant. The right
lower abdomen was prepped and draped in the usual sterile fashion.
1% lidocaine was used for local anesthesia.

Following this, a 19 gauge, 7-cm, Yueh catheter was introduced. An
ultrasound image was saved for documentation purposes. The
paracentesis was performed. The catheter was removed and a dressing
was applied. The patient tolerated the procedure well without
immediate post procedural complication.
FINDINGS: A total of approximately 250 cc of clear, light yellow fluid was
removed. Samples were sent to the laboratory as requested by the
clinical team.
IMPRESSION: Successful ultrasound-guided diagnostic paracentesis yielding 250 cc
of peritoneal fluid.

## 2020-01-13 IMAGING — US PARACENTESIS WITH ULTRASOUND GUIDANCE
1 series · 6 of 6 positions shown · non-contrast
Comparison: none

INDICATION: Patient with history of rheumatoid arthritis, encephalopathy,
hepatorenal syndrome, chronic kidney disease, prior CMV colitis,
failure to thrive, cirrhosis by imaging, recurrent ascites. Request
made for diagnostic paracentesis.

[Series 1: paracentesis with ultrasound guidance · 6 of 6 slices shown]
[im 1/6]
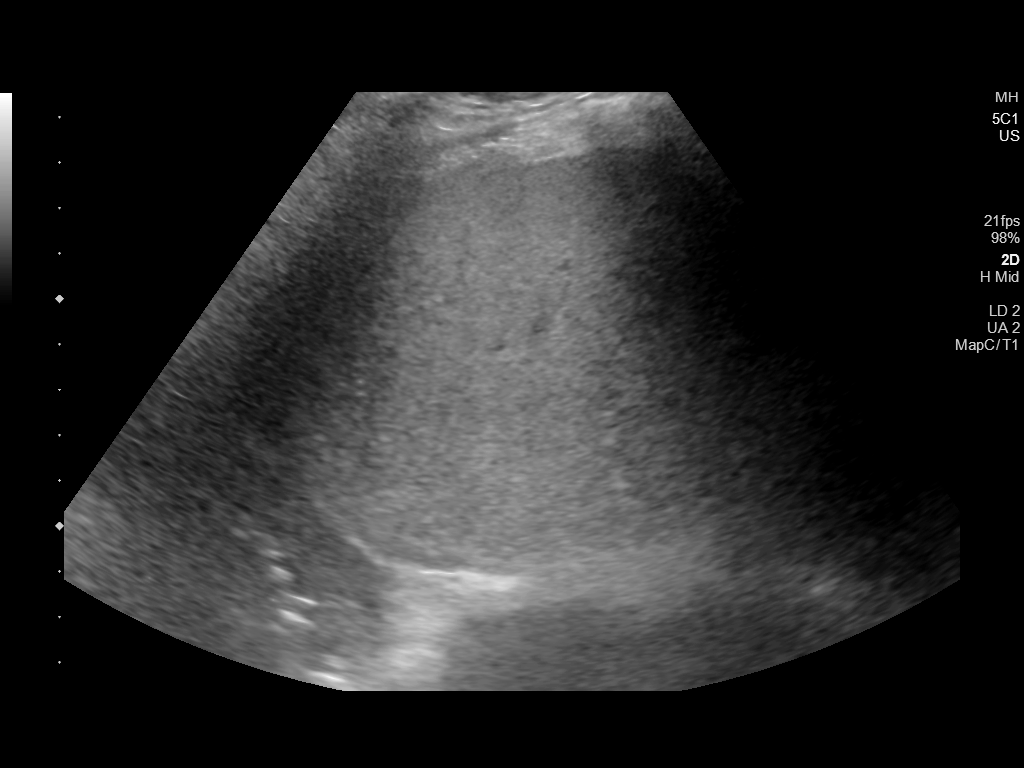
[im 2/6]
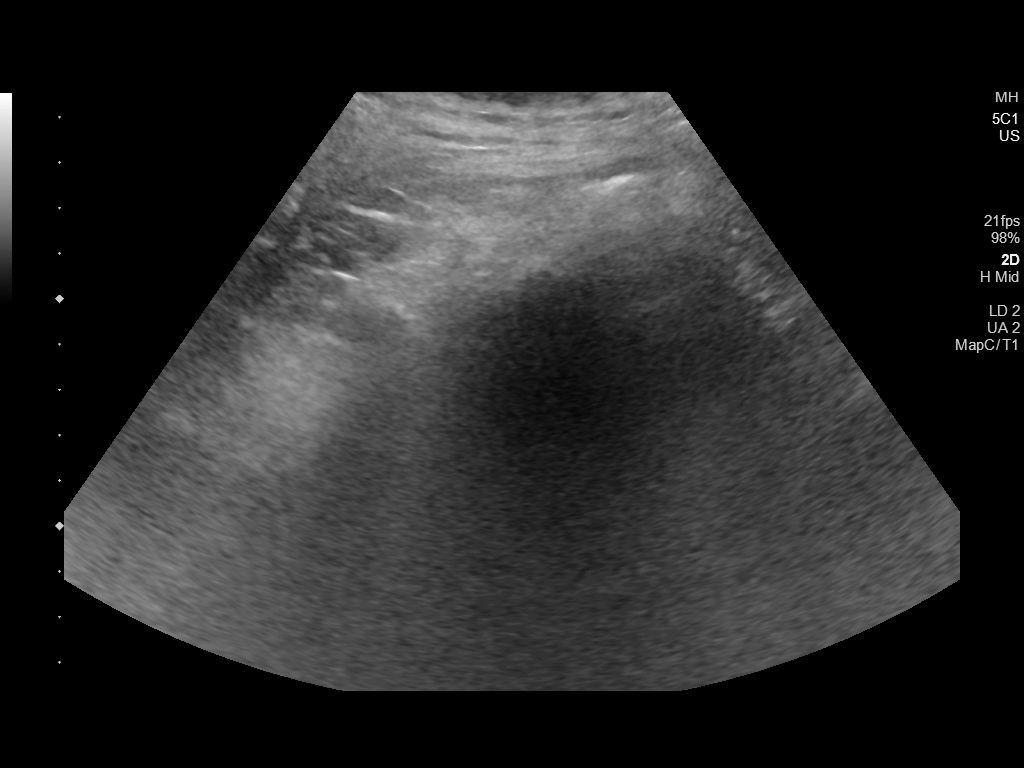
[im 3/6]
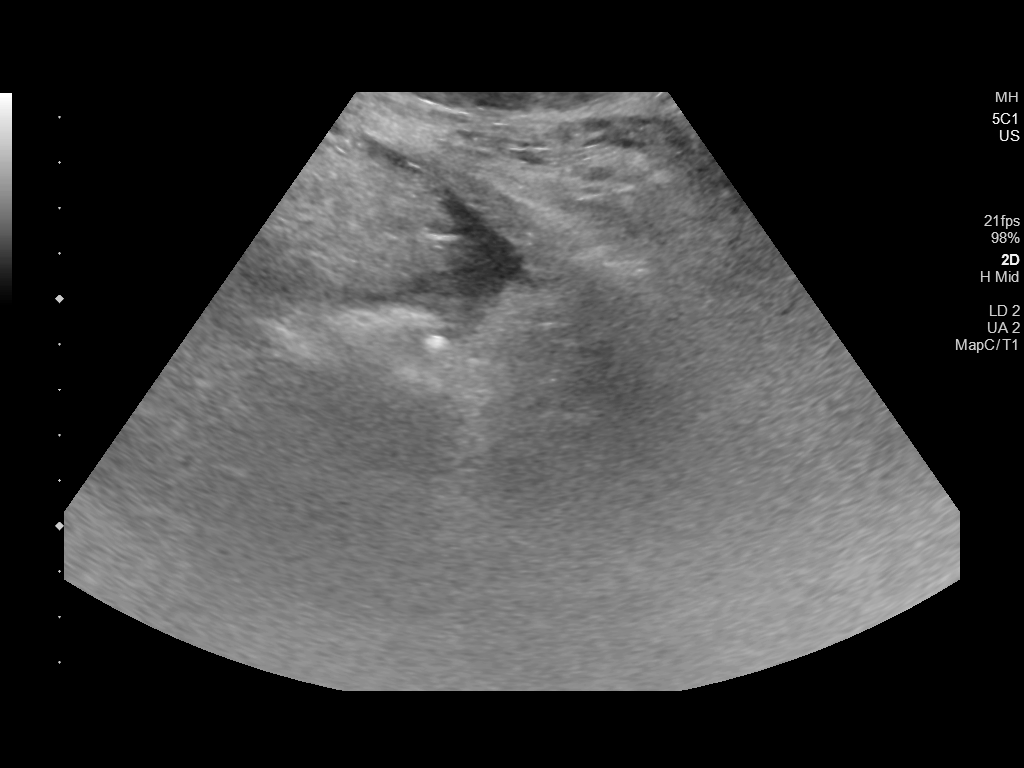
[im 4/6]
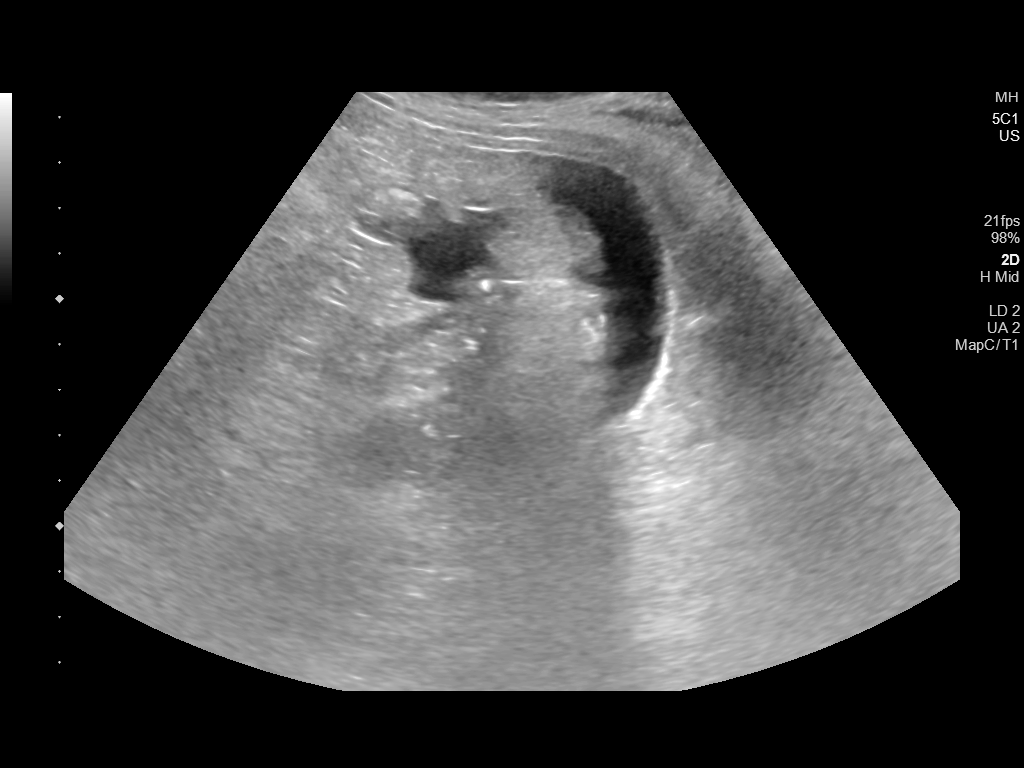
[im 5/6]
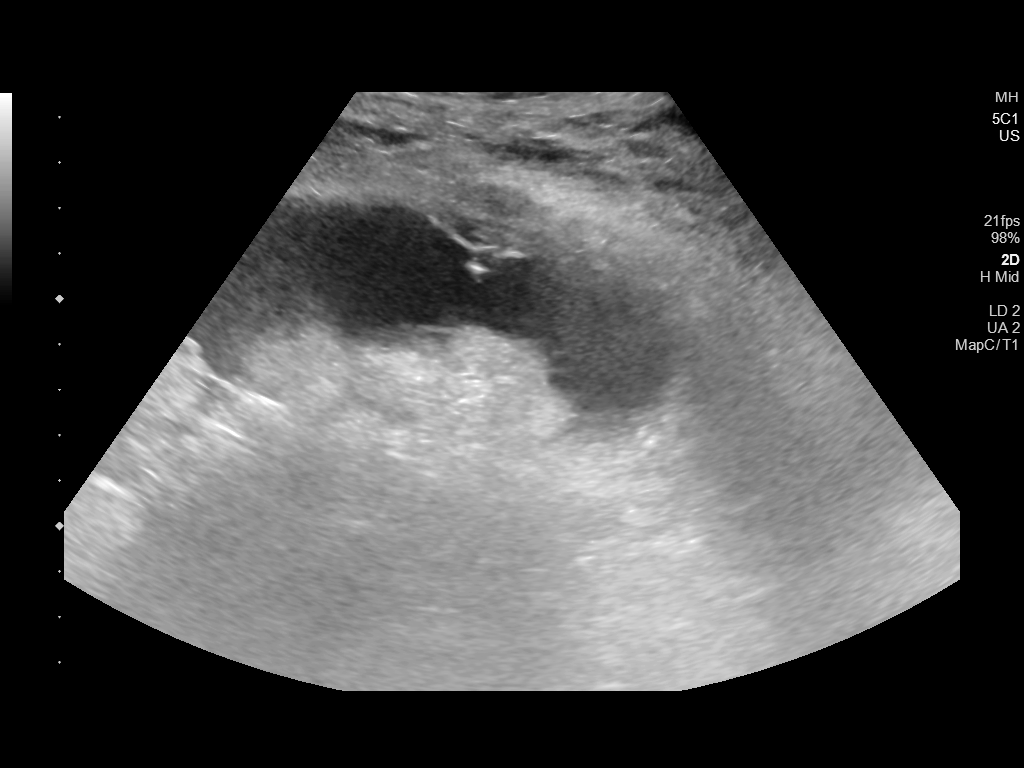
[im 6/6]
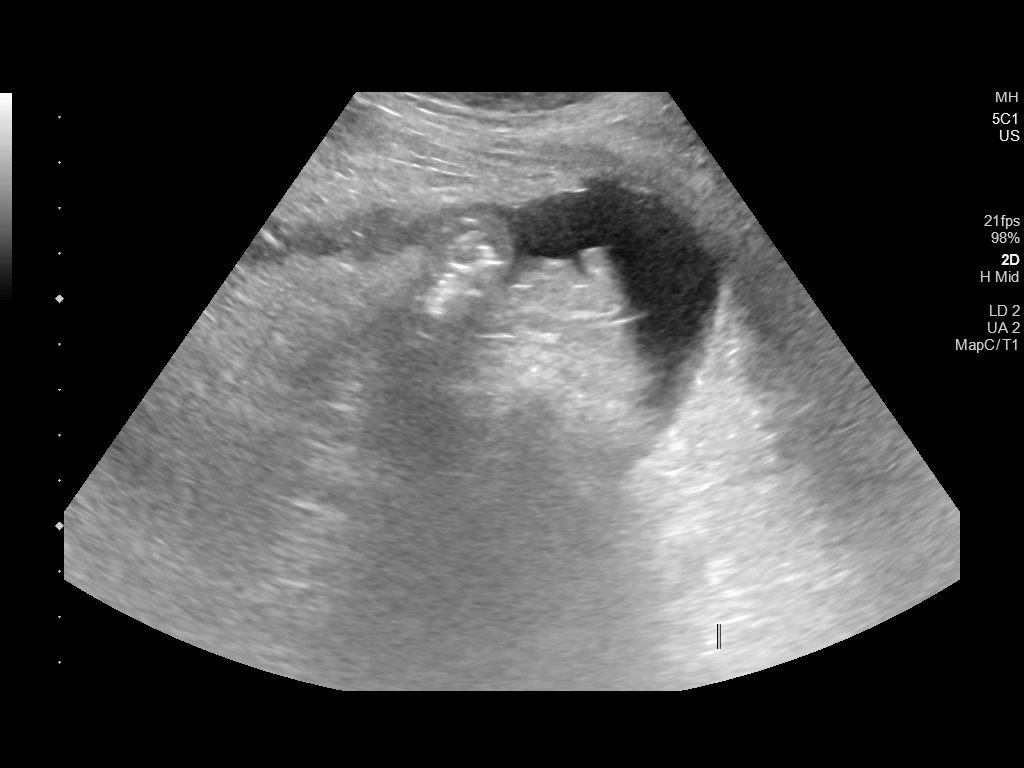

[6 of 6 positions shown; findings below may reference images not displayed]

EXAM:
ULTRASOUND GUIDED DIAGNOSTIC PARACENTESIS

MEDICATIONS:
None

COMPLICATIONS:
None immediate.

PROCEDURE:
Informed written consent was obtained from the patient's husband
after a discussion of the risks, benefits and alternatives to
treatment. A timeout was performed prior to the initiation of the
procedure.

Initial ultrasound scanning demonstrates a trace amount of ascites
within the left upper to mid abdominal quadrant. The left upper to
mid abdomen was prepped and draped in the usual sterile fashion. 1%
lidocaine was used for local anesthesia.

Following this, a 19 gauge, 7-cm, Yueh catheter was introduced. An
ultrasound image was saved for documentation purposes. The
paracentesis was performed. The catheter was removed and a dressing
was applied. The patient tolerated the procedure well without
immediate post procedural complication.
FINDINGS: A total of approximately 100 cc of yellow fluid was removed. Samples
were sent to the laboratory as requested by the clinical team.
IMPRESSION: Successful ultrasound-guided diagnostic paracentesis yielding 100
ccof peritoneal fluid.

## 2020-01-14 IMAGING — DX PORTABLE CHEST - 1 VIEW
1 series · 1 of 1 positions shown · non-contrast
Comparison: Chest radiographs dated 02/08/2019, 01/11/2019

CLINICAL DATA: 67 yoF with hypoxia. PMH HTN, HLD, COPD, asthma, RA
previously on immune modulators, h/o necrotizing
glomerulonephritis/vasculitis (+ GURIKA), and pancytopenia secondary
to cyclophosphamide. She presented to the ED with complaints
.*comment was truncated*

EXAM:
PORTABLE CHEST 1 VIEW

[chest ap]
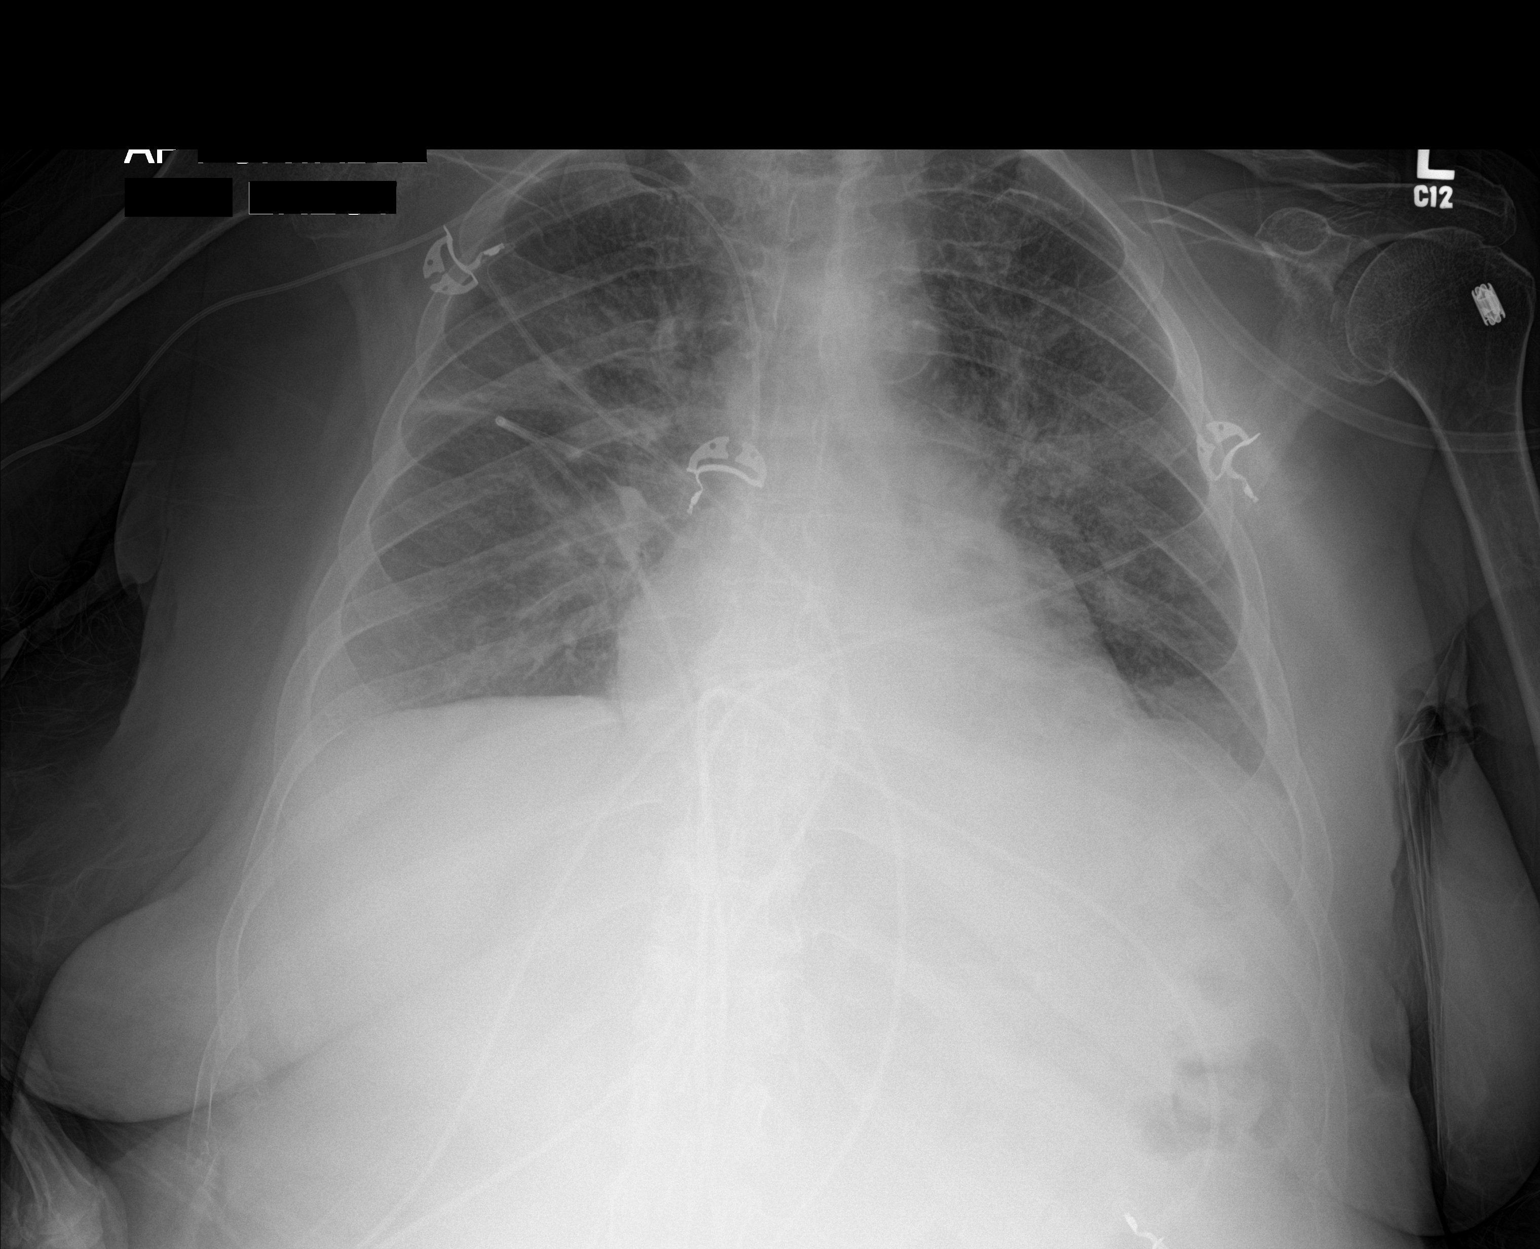

[1 of 1 positions shown; findings below may reference images not displayed]

FINDINGS: Stable cardiomediastinal contours. Right PICC tip overlies the
distal SVC. Bandlike opacity in the right mid lung likely reflecting
atelectasis. Improved aeration of the bilateral lung bases with
persistent opacification of the left hemidiaphragm possibly
representing atelectasis versus infiltrate. Diffuse bilateral
interstitial opacities may represent mild pulmonary edema. Probable
small bilateral pleural effusions, decreased from prior.
IMPRESSION: 1. Improved aeration of the bilateral lung bases with persistent
opacification of the left hemidiaphragm possibly representing
atelectasis versus infiltrate.
2. Mild bilateral diffuse interstitial opacities suspicious for
pulmonary edema. Probable small bilateral pleural effusions,
decreased from prior.

## 2020-02-22 DEATH — deceased

## 2021-02-21 DEATH — deceased
